# Patient Record
Sex: Female | Born: 1955 | Race: White | Hispanic: No | Marital: Married | Smoking: Never smoker
Health system: Southern US, Community
[De-identification: ages and names within clinical notes are randomized; demographics above are authoritative.]

## PROBLEM LIST (undated history)

## (undated) DIAGNOSIS — I350 Nonrheumatic aortic (valve) stenosis: Secondary | ICD-10-CM

## (undated) DIAGNOSIS — G4700 Insomnia, unspecified: Secondary | ICD-10-CM

## (undated) DIAGNOSIS — D649 Anemia, unspecified: Secondary | ICD-10-CM

## (undated) DIAGNOSIS — Z86718 Personal history of other venous thrombosis and embolism: Secondary | ICD-10-CM

## (undated) DIAGNOSIS — R911 Solitary pulmonary nodule: Secondary | ICD-10-CM

## (undated) DIAGNOSIS — M1990 Unspecified osteoarthritis, unspecified site: Secondary | ICD-10-CM

## (undated) DIAGNOSIS — I219 Acute myocardial infarction, unspecified: Secondary | ICD-10-CM

## (undated) DIAGNOSIS — M549 Dorsalgia, unspecified: Secondary | ICD-10-CM

## (undated) DIAGNOSIS — M2550 Pain in unspecified joint: Secondary | ICD-10-CM

## (undated) DIAGNOSIS — N3010 Interstitial cystitis (chronic) without hematuria: Secondary | ICD-10-CM

## (undated) DIAGNOSIS — B059 Measles without complication: Secondary | ICD-10-CM

## (undated) DIAGNOSIS — Z0000 Encounter for general adult medical examination without abnormal findings: Secondary | ICD-10-CM

## (undated) DIAGNOSIS — M25519 Pain in unspecified shoulder: Secondary | ICD-10-CM

## (undated) DIAGNOSIS — H269 Unspecified cataract: Secondary | ICD-10-CM

## (undated) DIAGNOSIS — G43909 Migraine, unspecified, not intractable, without status migrainosus: Secondary | ICD-10-CM

## (undated) DIAGNOSIS — F329 Major depressive disorder, single episode, unspecified: Secondary | ICD-10-CM

## (undated) DIAGNOSIS — I7300 Raynaud's syndrome without gangrene: Secondary | ICD-10-CM

## (undated) DIAGNOSIS — M7989 Other specified soft tissue disorders: Secondary | ICD-10-CM

## (undated) DIAGNOSIS — N390 Urinary tract infection, site not specified: Secondary | ICD-10-CM

## (undated) DIAGNOSIS — E669 Obesity, unspecified: Secondary | ICD-10-CM

## (undated) DIAGNOSIS — Z8619 Personal history of other infectious and parasitic diseases: Secondary | ICD-10-CM

## (undated) DIAGNOSIS — R55 Syncope and collapse: Secondary | ICD-10-CM

## (undated) DIAGNOSIS — F32A Depression, unspecified: Secondary | ICD-10-CM

## (undated) DIAGNOSIS — I10 Essential (primary) hypertension: Secondary | ICD-10-CM

## (undated) DIAGNOSIS — M719 Bursopathy, unspecified: Secondary | ICD-10-CM

## (undated) DIAGNOSIS — Z124 Encounter for screening for malignant neoplasm of cervix: Secondary | ICD-10-CM

## (undated) DIAGNOSIS — L659 Nonscarring hair loss, unspecified: Secondary | ICD-10-CM

## (undated) DIAGNOSIS — Z952 Presence of prosthetic heart valve: Secondary | ICD-10-CM

## (undated) DIAGNOSIS — R12 Heartburn: Secondary | ICD-10-CM

## (undated) DIAGNOSIS — R05 Cough: Secondary | ICD-10-CM

## (undated) DIAGNOSIS — E039 Hypothyroidism, unspecified: Secondary | ICD-10-CM

## (undated) DIAGNOSIS — I4891 Unspecified atrial fibrillation: Secondary | ICD-10-CM

## (undated) DIAGNOSIS — E785 Hyperlipidemia, unspecified: Secondary | ICD-10-CM

## (undated) DIAGNOSIS — R51 Headache: Secondary | ICD-10-CM

## (undated) DIAGNOSIS — K635 Polyp of colon: Secondary | ICD-10-CM

## (undated) DIAGNOSIS — F418 Other specified anxiety disorders: Secondary | ICD-10-CM

## (undated) DIAGNOSIS — K648 Other hemorrhoids: Secondary | ICD-10-CM

## (undated) DIAGNOSIS — F419 Anxiety disorder, unspecified: Secondary | ICD-10-CM

## (undated) DIAGNOSIS — R519 Headache, unspecified: Secondary | ICD-10-CM

## (undated) DIAGNOSIS — T7840XA Allergy, unspecified, initial encounter: Secondary | ICD-10-CM

## (undated) DIAGNOSIS — M797 Fibromyalgia: Secondary | ICD-10-CM

## (undated) DIAGNOSIS — K5900 Constipation, unspecified: Secondary | ICD-10-CM

## (undated) DIAGNOSIS — K219 Gastro-esophageal reflux disease without esophagitis: Secondary | ICD-10-CM

## (undated) HISTORY — DX: Syncope and collapse: R55

## (undated) HISTORY — DX: Solitary pulmonary nodule: R91.1

## (undated) HISTORY — DX: Nonscarring hair loss, unspecified: L65.9

## (undated) HISTORY — PX: BREAST BIOPSY: SHX20

## (undated) HISTORY — PX: FRACTURE SURGERY: SHX138

## (undated) HISTORY — DX: Anemia, unspecified: D64.9

## (undated) HISTORY — DX: Measles without complication: B05.9

## (undated) HISTORY — DX: Headache, unspecified: R51.9

## (undated) HISTORY — DX: Major depressive disorder, single episode, unspecified: F32.9

## (undated) HISTORY — PX: CORONARY ARTERY BYPASS GRAFT: SHX141

## (undated) HISTORY — PX: ATRIAL ABLATION SURGERY: SHX560

## (undated) HISTORY — DX: Anxiety disorder, unspecified: F41.9

## (undated) HISTORY — DX: Other hemorrhoids: K64.8

## (undated) HISTORY — DX: Encounter for screening for malignant neoplasm of cervix: Z12.4

## (undated) HISTORY — DX: Personal history of other venous thrombosis and embolism: Z86.718

## (undated) HISTORY — DX: Other specified anxiety disorders: F41.8

## (undated) HISTORY — DX: Unspecified atrial fibrillation: I48.91

## (undated) HISTORY — DX: Cough: R05

## (undated) HISTORY — DX: Unspecified osteoarthritis, unspecified site: M19.90

## (undated) HISTORY — DX: Migraine, unspecified, not intractable, without status migrainosus: G43.909

## (undated) HISTORY — DX: Urinary tract infection, site not specified: N39.0

## (undated) HISTORY — DX: Hyperlipidemia, unspecified: E78.5

## (undated) HISTORY — DX: Personal history of other infectious and parasitic diseases: Z86.19

## (undated) HISTORY — DX: Obesity, unspecified: E66.9

## (undated) HISTORY — DX: Raynaud's syndrome without gangrene: I73.00

## (undated) HISTORY — DX: Heartburn: R12

## (undated) HISTORY — DX: Pain in unspecified joint: M25.50

## (undated) HISTORY — DX: Unspecified cataract: H26.9

## (undated) HISTORY — PX: BLADDER SUSPENSION: SHX72

## (undated) HISTORY — DX: Acute myocardial infarction, unspecified: I21.9

## (undated) HISTORY — DX: Pain in unspecified shoulder: M25.519

## (undated) HISTORY — DX: Headache: R51

## (undated) HISTORY — DX: Essential (primary) hypertension: I10

## (undated) HISTORY — PX: ABDOMINAL HYSTERECTOMY: SHX81

## (undated) HISTORY — DX: Dorsalgia, unspecified: M54.9

## (undated) HISTORY — DX: Insomnia, unspecified: G47.00

## (undated) HISTORY — DX: Hypothyroidism, unspecified: E03.9

## (undated) HISTORY — DX: Interstitial cystitis (chronic) without hematuria: N30.10

## (undated) HISTORY — DX: Bursopathy, unspecified: M71.9

## (undated) HISTORY — PX: OTHER SURGICAL HISTORY: SHX169

## (undated) HISTORY — DX: Constipation, unspecified: K59.00

## (undated) HISTORY — DX: Presence of prosthetic heart valve: Z95.2

## (undated) HISTORY — PX: SHOULDER SURGERY: SHX246

## (undated) HISTORY — DX: Other specified soft tissue disorders: M79.89

## (undated) HISTORY — PX: TONSILLECTOMY: SUR1361

## (undated) HISTORY — DX: Fibromyalgia: M79.7

## (undated) HISTORY — PX: DILATION AND CURETTAGE OF UTERUS: SHX78

## (undated) HISTORY — PX: CARDIAC VALVE REPLACEMENT: SHX585

## (undated) HISTORY — DX: Encounter for general adult medical examination without abnormal findings: Z00.00

## (undated) HISTORY — DX: Allergy, unspecified, initial encounter: T78.40XA

## (undated) HISTORY — PX: TUBAL LIGATION: SHX77

## (undated) HISTORY — DX: Polyp of colon: K63.5

## (undated) HISTORY — DX: Gastro-esophageal reflux disease without esophagitis: K21.9

## (undated) HISTORY — DX: Depression, unspecified: F32.A

---

## 1998-09-18 ENCOUNTER — Other Ambulatory Visit: Admission: RE | Admit: 1998-09-18 | Discharge: 1998-09-18 | Payer: Self-pay | Admitting: Obstetrics and Gynecology

## 1999-10-04 ENCOUNTER — Other Ambulatory Visit: Admission: RE | Admit: 1999-10-04 | Discharge: 1999-10-04 | Payer: Self-pay | Admitting: Obstetrics and Gynecology

## 2000-06-12 ENCOUNTER — Inpatient Hospital Stay (HOSPITAL_COMMUNITY): Admission: EM | Admit: 2000-06-12 | Discharge: 2000-06-14 | Payer: Self-pay | Admitting: *Deleted

## 2000-06-12 ENCOUNTER — Encounter: Payer: Self-pay | Admitting: *Deleted

## 2000-06-13 ENCOUNTER — Encounter: Payer: Self-pay | Admitting: *Deleted

## 2000-10-14 ENCOUNTER — Other Ambulatory Visit: Admission: RE | Admit: 2000-10-14 | Discharge: 2000-10-14 | Payer: Self-pay | Admitting: Obstetrics and Gynecology

## 2004-04-17 ENCOUNTER — Emergency Department (HOSPITAL_COMMUNITY): Admission: EM | Admit: 2004-04-17 | Discharge: 2004-04-17 | Payer: Self-pay | Admitting: Emergency Medicine

## 2004-09-27 ENCOUNTER — Ambulatory Visit: Payer: Self-pay | Admitting: Internal Medicine

## 2004-10-22 ENCOUNTER — Ambulatory Visit: Payer: Self-pay | Admitting: Endocrinology

## 2005-06-06 ENCOUNTER — Ambulatory Visit: Payer: Self-pay | Admitting: Endocrinology

## 2005-06-07 ENCOUNTER — Ambulatory Visit: Payer: Self-pay | Admitting: Endocrinology

## 2007-05-21 ENCOUNTER — Ambulatory Visit: Payer: Self-pay | Admitting: Internal Medicine

## 2007-06-04 ENCOUNTER — Ambulatory Visit: Payer: Self-pay | Admitting: Internal Medicine

## 2007-06-04 ENCOUNTER — Encounter: Payer: Self-pay | Admitting: Internal Medicine

## 2011-02-15 NOTE — Discharge Summary (Signed)
[place]. [facility]  Patient:    Alexandra Patterson, Alexandra Patterson                         MRN: [id number] Adm. Date:  [id number] Disc. Date: 06/14/00 Attending:  Veneda Melter Dictator:   Tereso Newcomer, P.A.                           Discharge Summary  DATE OF BIRTH:  Oct 30, 1955.  DISCHARGE DIAGNOSES 1. First episode of recorded paroxysmal atrial fibrillation, with spontaneous    conversion to normal sinus rhythm. 2. Syncope possibly related to #1, also partly reason for admission. 3. Hypertension. 4. History of migraines. 5. History of anxiety. 6. Status post tubal ligation, 1983. 7. Tonsillectomy at age 65. 8. Dilatation and curettage x 3.  PROCEDURES PERFORMED THIS ADMISSION 1. Gated exercise treadmill Cardiolite on June 13, 2000 negative for    ischemia, EF 63%. 2. 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  ADMISSION HISTORY:  This 55 year old female, with the above-noted history, presented to the emergency room after suffering from a syncopal episode in her home that morning.  The patient noted that she was weak, nauseated, short of breath and had some palpitations; she also noted a chest heaviness located substernally that she qualified as a pressure that radiated to her upper back since arriving to the emergency room.  She denied any radiation to arms or neck, vomiting, visual changes or headache with the incident.  Patient presented to the [facility] Emergency Room in atrial fibrillation with a rate of 110 to 120.  PHYSICAL EXAMINATION:  Initial physical exam revealed an alert female in no acute distress.  Blood pressure 117/61, respirations 20, pulse 117, temperature 98.2.  NECK:   Without JVD or bruits.  LUNGS:  Clear to auscultation.  HEART:  Tachycardic, regular rhythm.  No murmurs, rubs, or gallops.  Positive S4.  EXTREMITIES:  Without edema.  LABORATORY DATA:  Initial labs:  Sodium 141, potassium 4.1, chloride 108, BUN 21, glucose 91, creatinine 1.0.  Hemoglobin 11.5, hematocrit 34.5, white count 7100, platelet count 324,000.  Total CK 36, CK-MB 0.6, troponin I 0.06.  HOSPITAL COURSE:  After complete workup in the emergency room, the patient spontaneously converted to normal sinus rhythm without any medical therapy. She remained in normal sinus rhythm throughout the remainder of her stay in the hospital.  She was noted to have a low hemoglobin and hematocrit; hemoglobin 10.7 and hematocrit 31.7 on recheck.  Her MCV was 90.5, RDW 13.2. Reticulocyte count was ordered and this was pending at discharge.  She subsequently ruled out for myocardial infarction by enzymes; total CK #2 26, CK-MB 0.8, troponin I less than 0.03.  Risk stratification revealed total cholesterol of 223, triglycerides 81, HDL 71, LDL 136.  TSH was checked and this was normal at 2.898.  T4 was 7.6.  Free T4 was 1.29.  Patient went for the treadmill Cardiolite on June 13, 2000; the results are noted above. It was decided, given the results of her echocardiogram and her Cardiolite, that she would be held overnight, continued to be monitored and if she had no other problems with  syncope, then she could be discharged to home.  On the morning of June 14, 2000, she was in stable condition without any further symptoms.  It was decided at that time that her Tenormin could be increased to 50 mg in the morning and 25 mg in the evening.  She should remain on aspirin for the time-being.  Discussions were made with Dr. Doylene Canning. Ladona Ridgel in regards to anticoagulation.  In light of a normal echocardiogram, with only mild valvular abnormalities, and a normal Cardiolite, heparin and/or Coumadin provided  no added benefit.  At discharge, it was decided that outpatient event monitor could be considered at followup to better evaluate her rate control. She has also been advised that she needs subacute bacterial endocarditis prophylaxis due to her mild valvular abnormalities.  DISCHARGE MEDICATIONS 1. Atenolol 50 mg q.a.m., 25 mg q.p.m. 2. Coated aspirin 325 mg q.d.  ACTIVITY:  As tolerated.  DIET:  Low-fat, low-sodium diet.  SPECIAL INSTRUCTIONS:  As noted above, the patient is to exercise SBE prophylaxis.  FOLLOWUP:  Followup is with Dr. Corwin Levins in one to two weeks, her primary care physician; she is to call for an appointment.  Follow up with Dr. Veneda Melter in one month; she has also been advised to call for an appointment.  As noted above, consideration for outpatient event monitor may be done at that time.  She has been asked to follow up with Dr. Jonny Ruiz for her anemia.  As noted above, reticulocyte count is pending at discharge. DD:  06/14/00 TD:  06/14/00 Job: [id number]

## 2012-03-10 ENCOUNTER — Encounter: Payer: Self-pay | Admitting: Internal Medicine

## 2012-03-10 ENCOUNTER — Ambulatory Visit (INDEPENDENT_AMBULATORY_CARE_PROVIDER_SITE_OTHER): Payer: Managed Care, Other (non HMO) | Admitting: Internal Medicine

## 2012-03-10 ENCOUNTER — Telehealth: Payer: Self-pay | Admitting: Internal Medicine

## 2012-03-10 ENCOUNTER — Encounter: Payer: Self-pay | Admitting: *Deleted

## 2012-03-10 VITALS — BP 124/80 | HR 72 | Ht 68.0 in | Wt 249.0 lb

## 2012-03-10 DIAGNOSIS — R112 Nausea with vomiting, unspecified: Secondary | ICD-10-CM

## 2012-03-10 DIAGNOSIS — K219 Gastro-esophageal reflux disease without esophagitis: Secondary | ICD-10-CM

## 2012-03-10 DIAGNOSIS — R1013 Epigastric pain: Secondary | ICD-10-CM

## 2012-03-10 DIAGNOSIS — K3189 Other diseases of stomach and duodenum: Secondary | ICD-10-CM

## 2012-03-10 MED ORDER — SUCRALFATE 1 GM/10ML PO SUSP: 1.0000 g | Freq: Two times a day (BID) | ORAL | Status: DC

## 2012-03-10 MED ORDER — ONDANSETRON HCL 4 MG PO TABS: ORAL_TABLET | ORAL | Status: DC

## 2012-03-10 MED ORDER — ESOMEPRAZOLE MAGNESIUM 40 MG PO CPDR: 40.0000 mg | DELAYED_RELEASE_CAPSULE | Freq: Two times a day (BID) | ORAL | Status: DC

## 2012-03-10 NOTE — Patient Instructions (Addendum)
You have been scheduled for an endoscopy with propofol. Please follow written instructions given to you at your visit today. Continue Pradaxa for your procedure as per Dr Juanda Chance. You have been scheduled for an abdominal ultrasound at [facility] (1st floor of hospital) on Monday, 03/16/12 at 9:00 am. Please arrive 15 minutes prior to your appointment for registration. Make certain not to have anything to eat or drink 6 hours prior to your appointment. Should you need to reschedule your appointment, please contact radiology at [phone]. We have sent the following medications to your pharmacy for you to pick up at your convenience: Carafate Zofran Nexium (take 1 tablet twice daily instead of 1 tablet once daily). CC: Dr R.Tonuzi

## 2012-03-10 NOTE — Progress Notes (Signed)
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 She has been under a great deal of stress since January 2013. She takes Mobic 15 mg daily for degenerative joint disease. She does not smoke and does not drink alcohol. Her mother just had gallbladder surgery. Patient has been on Nexium 40 mg daily. We did a screening colonoscopy in 2003 and again in 2008 because of a family history of colon cancer in her father. She had a hyperplastic polyp in 2008 and would be due for a recall colonoscopy in September 2013.   Past Medical History  Diagnosis Date  . Internal hemorrhoids   . Hyperplastic colon polyp   . Atrial fibrillation   . Hypertension   . Migraine   . Anxiety    Past Surgical History  Procedure Date  . Tonsillectomy   . Tubal ligation   . Dilation and curettage of uterus     x 3    reports that she has never smoked. She has never used smokeless tobacco. She reports that she does not drink alcohol or use illicit drugs. family history includes Colon cancer in an unspecified family member and Colon polyps in her father. Allergies  Allergen Reactions  . Erythromycin Rash  . Prednisone Rash        Review of Systems: Positive for heartburn. Negative for dysphagia odynophagia or chest pain  The remainder of the 10 point ROS is negative except as outlined in H&P   Physical Exam: General appearance  Well developed, in no distress. Overweight Eyes- non icteric. HEENT nontraumatic, normocephalic. Mouth no lesions, tongue papillated, no cheilosis. Neck supple without adenopathy, thyroid not enlarged, no carotid bruits, no JVD. Lungs Clear to auscultation bilaterally. Cor normal S1, normal S2, regular rhythm, no murmur,  quiet precordium. Abdomen: Obese soft with  mild discomfort in epigastrium and midline. Liver edge 2 cm below right costal margin. Splenic tip not palpable. No distention. No ascites. Rectal: Not done. Extremities no pedal edema. Skin no lesions. Neurological alert and oriented x 3. Psychological normal mood and affect.  Assessment and Plan:  Problem #1 Progressive dyspepsia, heartburn and indigestion suggestive of either nonulcer dyspepsia or possibly biliary dysfunction. She is on Mobic 15 mg daily which may cause gastropathy. She is not willing to stop because of possible joint pains. We will increase her Nexium to 40 mg by mouth twice a day and add Carafate slurry 10 cc by mouth twice a day. We will go ahead and schedule an upper abdominal ultrasound to rule out symptomatic gallbladder disease and will also schedule her for an upper endoscopy to rule out a gastric ulcer. She will be tested for H. Pylori at the time of her endoscopy. For her nausea, she will get Zofran 4 mg when necessary. She already was given Phenergan which makes her sleepy.  03/10/2012 Alexandra Patterson

## 2012-03-10 NOTE — Telephone Encounter (Signed)
Patient states she has been having nausea and severe reflux for the last few weeks. She is taking Nexium daily without relief. Scheduled with Dr. Juanda Chance today at 2:00 PM

## 2012-03-13 ENCOUNTER — Encounter: Payer: Self-pay | Admitting: Internal Medicine

## 2012-03-13 ENCOUNTER — Ambulatory Visit (AMBULATORY_SURGERY_CENTER): Payer: Managed Care, Other (non HMO) | Admitting: Internal Medicine

## 2012-03-13 VITALS — BP 145/75 | HR 76 | Temp 98.4°F | Resp 18 | Ht 68.0 in | Wt 249.0 lb

## 2012-03-13 DIAGNOSIS — R12 Heartburn: Secondary | ICD-10-CM

## 2012-03-13 DIAGNOSIS — D131 Benign neoplasm of stomach: Secondary | ICD-10-CM

## 2012-03-13 DIAGNOSIS — K2960 Other gastritis without bleeding: Secondary | ICD-10-CM

## 2012-03-13 MED ORDER — SODIUM CHLORIDE 0.9 % IV SOLN: 500.0000 mL | INTRAVENOUS | Status: DC

## 2012-03-13 NOTE — Progress Notes (Signed)
Patient did not experience any of the following events: a burn prior to discharge; a fall within the facility; wrong site/side/patient/procedure/implant event; or a hospital transfer or hospital admission upon discharge from the facility. (G8907) Patient did not have preoperative order for IV antibiotic SSI prophylaxis. (G8918)  

## 2012-03-13 NOTE — Patient Instructions (Addendum)

## 2012-03-13 NOTE — Op Note (Signed)
[facility] [address] [place], [place]  [address]  ENDOSCOPY PROCEDURE REPORT  PATIENT:  Alexandra Patterson, Alexandra Patterson  MR#:  [id number] BIRTHDATE:  01-Dec-1955, 55 yrs. old  GENDER:  female  ENDOSCOPIST:  Hedwig Morton. Juanda Chance, MD Referred by:  Geradine Girt, M.D.  PROCEDURE DATE:  03/13/2012 PROCEDURE:  EGD with biopsy, [id number] ASA CLASS:  Class II INDICATIONS:  nausea, GERD pt has been on Mobic 15 mg/day pt is on Predaxa  MEDICATIONS:   MAC sedation, administered by CRNA, propofol (Diprivan) 180 mg TOPICAL ANESTHETIC:  Cetacaine Spray  DESCRIPTION OF PROCEDURE:   After the risks benefits and alternatives of the procedure were thoroughly explained, informed consent was obtained.  The LB GIF-H180 [id number] endoscope was introduced through the mouth and advanced to the second portion of the duodenum, without limitations.  The instrument was slowly withdrawn as the mucosa was fully examined. <<PROCEDUREIMAGES>>  Mild gastritis was found. mild focal erythema in the antrum A biopsy for H. pylori was taken (see image2).  A hiatal hernia was found (see image5 and image4). 1-2 cm hiatal hernia  Otherwise the examination was normal (see image1 and image3).    Retroflexed views revealed no abnormalities.    The scope was then withdrawn from the patient and the procedure completed.  COMPLICATIONS:  None  ENDOSCOPIC IMPRESSION: 1) Mild gastritis 2) Hiatal hernia 3) Otherwise normal examination s/p biopsies to r/o H.pylori RECOMMENDATIONS: 1) Await biopsy results continue PPI, Carafata Abdominal ultrasound has been scheduled  REPEAT EXAM:  In 0 year(s) for.  ______________________________ Hedwig Morton. Juanda Chance, MD  CC:  n. eSIGNED:   Hedwig Morton. Evette Diclemente at 03/13/2012 11:09 AM  Elby Showers, [phone]

## 2012-03-16 ENCOUNTER — Ambulatory Visit (HOSPITAL_COMMUNITY)
Admission: RE | Admit: 2012-03-16 | Discharge: 2012-03-16 | Disposition: A | Discharge status: Home / Self Care | Payer: Managed Care, Other (non HMO) | Source: Ambulatory Visit | Attending: Internal Medicine | Admitting: Internal Medicine

## 2012-03-16 ENCOUNTER — Telehealth: Payer: Self-pay | Admitting: *Deleted

## 2012-03-16 DIAGNOSIS — R112 Nausea with vomiting, unspecified: Secondary | ICD-10-CM | POA: Insufficient documentation

## 2012-03-16 DIAGNOSIS — R109 Unspecified abdominal pain: Secondary | ICD-10-CM | POA: Insufficient documentation

## 2012-03-16 DIAGNOSIS — I10 Essential (primary) hypertension: Secondary | ICD-10-CM | POA: Insufficient documentation

## 2012-03-16 NOTE — Telephone Encounter (Signed)
  Follow up Call-  Call back number 03/13/2012  Post procedure Call Back phone  # [phone]  Permission to leave phone message Yes     Left message to call if having problems or has questions

## 2012-03-17 ENCOUNTER — Encounter: Payer: Self-pay | Admitting: Internal Medicine

## 2012-03-27 ENCOUNTER — Encounter (HOSPITAL_BASED_OUTPATIENT_CLINIC_OR_DEPARTMENT_OTHER): Payer: Self-pay | Admitting: *Deleted

## 2012-03-27 ENCOUNTER — Emergency Department (HOSPITAL_BASED_OUTPATIENT_CLINIC_OR_DEPARTMENT_OTHER)
Admission: EM | Admit: 2012-03-27 | Discharge: 2012-03-27 | Disposition: A | Discharge status: Home / Self Care | Payer: Managed Care, Other (non HMO) | Attending: Emergency Medicine | Admitting: Emergency Medicine

## 2012-03-27 DIAGNOSIS — G43909 Migraine, unspecified, not intractable, without status migrainosus: Secondary | ICD-10-CM

## 2012-03-27 DIAGNOSIS — I4891 Unspecified atrial fibrillation: Secondary | ICD-10-CM | POA: Insufficient documentation

## 2012-03-27 DIAGNOSIS — I10 Essential (primary) hypertension: Secondary | ICD-10-CM | POA: Insufficient documentation

## 2012-03-27 MED ORDER — SODIUM CHLORIDE 0.9 % IV BOLUS (SEPSIS)
1000.0000 mL | Freq: Once | INTRAVENOUS | Status: AC
  Administered 2012-03-27: 1000 mL via INTRAVENOUS

## 2012-03-27 MED ORDER — METOCLOPRAMIDE HCL 5 MG/ML IJ SOLN
10.0000 mg | Freq: Once | INTRAMUSCULAR | Status: AC
  Administered 2012-03-27: 10 mg via INTRAVENOUS
  Filled 2012-03-27: qty 2

## 2012-03-27 MED ORDER — DIPHENHYDRAMINE HCL 50 MG/ML IJ SOLN
25.0000 mg | Freq: Once | INTRAMUSCULAR | Status: AC
  Administered 2012-03-27: 25 mg via INTRAVENOUS
  Filled 2012-03-27: qty 1

## 2012-03-27 NOTE — ED Notes (Signed)
Woke up at 0330 this am with migraine has nasal imitrex but for last 2-3 headaches hasnt worked

## 2012-03-27 NOTE — ED Provider Notes (Signed)
History     CSN: [id number]  Arrival date & time 03/27/12  1242   First MD Initiated Contact with Patient 03/27/12 1300      Chief Complaint  Patient presents with  . Migraine    (Consider location/radiation/quality/duration/timing/severity/associated sxs/prior treatment) HPI Comments: Pt has a longstanding hx of migraines, presents today with headache, similar to her past migraines.  Woke up with pain this am, describes at throbbing to top of head, going down neck.  Same type pain as prior headaches.  Is followed by a neurologist in [place].  No fevers, no vomiting, no unusual headache or other symptoms.  No numbness or weakness to extremities.  No fever, no rash.  Patient is a 56 y.o. female presenting with migraine. The history is provided by the patient.  Migraine This is a new problem. The current episode started 12 to 24 hours ago. The problem occurs constantly. The problem has been gradually worsening. Associated symptoms include headaches. Pertinent negatives include no chest pain, no abdominal pain and no shortness of breath.    Past Medical History  Diagnosis Date  . Internal hemorrhoids   . Hyperplastic colon polyp   . Atrial fibrillation   . Hypertension   . Migraine   . Anxiety     Past Surgical History  Procedure Date  . Tonsillectomy   . Tubal ligation   . Dilation and curettage of uterus     x 3  . Abdominal hysterectomy   . Shoulder surgery   . Atrial ablation surgery     Family History  Problem Relation Age of Onset  . Colon cancer      parent  . Colon polyps Father     History  Substance Use Topics  . Smoking status: Never Smoker   . Smokeless tobacco: Never Used  . Alcohol Use: No    OB History    Grav Para Term Preterm Abortions TAB SAB Ect Mult Living                  Review of Systems  Constitutional: Negative for fever, chills, diaphoresis and fatigue.  HENT: Negative for congestion, rhinorrhea and sneezing.   Eyes: Negative.     Respiratory: Negative for cough, chest tightness and shortness of breath.   Cardiovascular: Negative for chest pain and leg swelling.  Gastrointestinal: Positive for nausea. Negative for vomiting, abdominal pain, diarrhea and blood in stool.  Genitourinary: Negative for frequency, hematuria, flank pain and difficulty urinating.  Musculoskeletal: Negative for back pain and arthralgias.  Skin: Negative for rash.  Neurological: Positive for headaches. Negative for dizziness, speech difficulty, weakness and numbness.  Psychiatric/Behavioral: Negative for confusion.    Allergies  Erythromycin and Prednisone  Home Medications   Current Outpatient Rx  Name Route Sig Dispense Refill  . DABIGATRAN ETEXILATE MESYLATE 150 MG PO CAPS Oral Take 150 mg by mouth 2 (two) times daily.    Marland Kitchen ESOMEPRAZOLE MAGNESIUM 40 MG PO CPDR Oral Take 1 capsule (40 mg total) by mouth 2 (two) times daily. 60 capsule 2  . ESTRADIOL 1 MG PO TABS Oral Take 1 mg by mouth daily.    . IRBESARTAN 150 MG PO TABS Oral Take 150 mg by mouth at bedtime.    . MELOXICAM 15 MG PO TABS Oral Take 15 mg by mouth daily.    . NYSTATIN 100000 UNIT/GM EX CREA Topical Apply 1 application topically 2 (two) times daily.    Marland Kitchen ONDANSETRON HCL 4 MG PO TABS  Take 1 tablet by mouth every 6-8 hours as needed. 30 tablet 0  . PREGABALIN 75 MG PO CAPS Oral Take 75 mg by mouth 2 (two) times daily.    Marland Kitchen SIMVASTATIN 20 MG PO TABS Oral Take 20 mg by mouth every evening.    Marland Kitchen SOTALOL HCL (AF) 120 MG PO TABS Oral Take 60 mg by mouth 2 (two) times daily.    . SUCRALFATE 1 GM/10ML PO SUSP Oral Take 10 mLs (1 g total) by mouth 2 (two) times daily. 600 mL 0  . SUMATRIPTAN 20 MG/ACT NA SOLN Nasal Place 1 spray into the nose every 2 (two) hours as needed.    . VENLAFAXINE HCL ER 225 MG PO TB24 Oral Take 1 tablet by mouth daily.    Marland Kitchen VITAMIN D (ERGOCALCIFEROL) 50000 UNITS PO CAPS Oral Take 50,000 Units by mouth every 7 (seven) days.    Marland Kitchen ZOLPIDEM TARTRATE ER 12.5  MG PO TBCR Oral Take 12.5 mg by mouth at bedtime as needed.      BP 145/91  Pulse 83  Temp 97.6 F (36.4 C) (Oral)  Resp 20  SpO2 100%  Physical Exam  Constitutional: She is oriented to person, place, and time. She appears well-developed and well-nourished.  HENT:  Head: Normocephalic and atraumatic.  Eyes: Pupils are equal, round, and reactive to light.  Neck: Normal range of motion. Neck supple.  Cardiovascular: Normal rate, regular rhythm and normal heart sounds.   Pulmonary/Chest: Effort normal and breath sounds normal. No respiratory distress. She has no wheezes. She has no rales. She exhibits no tenderness.  Abdominal: Soft. Bowel sounds are normal. There is no tenderness. There is no rebound and no guarding.  Musculoskeletal: Normal range of motion. She exhibits no edema.  Lymphadenopathy:    She has no cervical adenopathy.  Neurological: She is alert and oriented to person, place, and time. She has normal strength. No cranial nerve deficit or sensory deficit. GCS eye subscore is 4. GCS verbal subscore is 5. GCS motor subscore is 6.  Skin: Skin is warm and dry. No rash noted.  Psychiatric: She has a normal mood and affect.    ED Course  Procedures (including critical care time)  Labs Reviewed - No data to display No results found.   1. Migraine       MDM  Pt got benadryl and reglan injections with great relief of headache.  Pain feels typical of her past migraines. There's nothing to suggest other etiology such as subarachnoid hemorrhage or meningitis        Rolan Bucco, MD 03/27/12 1431

## 2012-04-29 ENCOUNTER — Encounter: Payer: Self-pay | Admitting: Internal Medicine

## 2012-05-19 ENCOUNTER — Other Ambulatory Visit: Payer: Self-pay | Admitting: Internal Medicine

## 2012-05-25 ENCOUNTER — Other Ambulatory Visit: Payer: Self-pay | Admitting: Internal Medicine

## 2014-01-31 ENCOUNTER — Telehealth: Payer: Self-pay | Admitting: *Deleted

## 2014-01-31 NOTE — Telephone Encounter (Signed)
I have gotten records from [employer], Dr Delphi office. These records were from 11/09/2013 (including labwork, hgb 9.0 and office notes), no recent notes. Fax read " Regarding Alexandra Patterson DOB:2056/02/17 Please call patient and schedule appointment ASAP due to low blood count." Patient was actually already on our schedule for 03/08/14. However, I contacted Rande Brunt at [employer] (who actually sent the fax) to get more recent notes since I was sure that an "urgent" referral would not have just been sent over from 4 months ago. Ivin Booty states that patient came to their office, had a low blood count, was seen by [facility] and scheduled for endoscopy and colonoscopy. She states patient cancelled these procedures because she thought they were "jumping the gun" and she wanted to see her old doctor. I advised that unfortunately, this was a change in care when she went to [place] and that we would need records for Dr Olevia Perches to review and decide if she would accept patient back prior to [place] scheduling a visit. Ivin Booty verbalizes understanding and will tell patient this. Patient has been taken off schedule for 03/08/14 as well until we get records.

## 2014-02-08 NOTE — Telephone Encounter (Signed)
I contacted patient to advise that Dr Olevia Perches agreed to see her back in the office and would like her to be seen ASAP due to anemia. Patient states that she is actually rescheduled at [place] for colonoscopy this upcoming week.

## 2014-03-08 ENCOUNTER — Ambulatory Visit: Payer: Managed Care, Other (non HMO) | Admitting: Internal Medicine

## 2014-04-18 ENCOUNTER — Encounter: Payer: Self-pay | Admitting: Internal Medicine

## 2015-03-13 ENCOUNTER — Ambulatory Visit (INDEPENDENT_AMBULATORY_CARE_PROVIDER_SITE_OTHER): Payer: Managed Care, Other (non HMO) | Admitting: Family Medicine

## 2015-03-13 ENCOUNTER — Encounter: Payer: Self-pay | Admitting: Family Medicine

## 2015-03-13 VITALS — BP 134/90 | HR 78 | Temp 98.9°F | Ht 67.0 in | Wt 244.4 lb

## 2015-03-13 DIAGNOSIS — E669 Obesity, unspecified: Secondary | ICD-10-CM

## 2015-03-13 DIAGNOSIS — Z0000 Encounter for general adult medical examination without abnormal findings: Secondary | ICD-10-CM

## 2015-03-13 DIAGNOSIS — R911 Solitary pulmonary nodule: Secondary | ICD-10-CM | POA: Insufficient documentation

## 2015-03-13 DIAGNOSIS — Z1283 Encounter for screening for malignant neoplasm of skin: Secondary | ICD-10-CM | POA: Diagnosis not present

## 2015-03-13 DIAGNOSIS — G43909 Migraine, unspecified, not intractable, without status migrainosus: Secondary | ICD-10-CM | POA: Insufficient documentation

## 2015-03-13 DIAGNOSIS — E785 Hyperlipidemia, unspecified: Secondary | ICD-10-CM | POA: Diagnosis not present

## 2015-03-13 DIAGNOSIS — T7840XA Allergy, unspecified, initial encounter: Secondary | ICD-10-CM | POA: Insufficient documentation

## 2015-03-13 DIAGNOSIS — K635 Polyp of colon: Secondary | ICD-10-CM | POA: Insufficient documentation

## 2015-03-13 DIAGNOSIS — K219 Gastro-esophageal reflux disease without esophagitis: Secondary | ICD-10-CM | POA: Diagnosis not present

## 2015-03-13 DIAGNOSIS — Z952 Presence of prosthetic heart valve: Secondary | ICD-10-CM

## 2015-03-13 DIAGNOSIS — Z8619 Personal history of other infectious and parasitic diseases: Secondary | ICD-10-CM | POA: Insufficient documentation

## 2015-03-13 DIAGNOSIS — Z954 Presence of other heart-valve replacement: Secondary | ICD-10-CM

## 2015-03-13 DIAGNOSIS — D649 Anemia, unspecified: Secondary | ICD-10-CM

## 2015-03-13 DIAGNOSIS — I10 Essential (primary) hypertension: Secondary | ICD-10-CM | POA: Insufficient documentation

## 2015-03-13 DIAGNOSIS — Z86718 Personal history of other venous thrombosis and embolism: Secondary | ICD-10-CM | POA: Insufficient documentation

## 2015-03-13 DIAGNOSIS — M797 Fibromyalgia: Secondary | ICD-10-CM | POA: Insufficient documentation

## 2015-03-13 DIAGNOSIS — I4891 Unspecified atrial fibrillation: Secondary | ICD-10-CM

## 2015-03-13 LAB — CBC
HCT: 40.9 % (ref 36.0–46.0)
Hemoglobin: 13.2 g/dL (ref 12.0–15.0)
MCHC: 32.4 g/dL (ref 30.0–36.0)
MCV: 86.8 fl (ref 78.0–100.0)
Platelets: 276 10*3/uL (ref 150.0–400.0)
RBC: 4.71 Mil/uL (ref 3.87–5.11)
RDW: 15.8 % — ABNORMAL HIGH (ref 11.5–15.5)
WBC: 9.8 10*3/uL (ref 4.0–10.5)

## 2015-03-13 LAB — LIPID PANEL
CHOLESTEROL: 214 mg/dL — AB (ref 0–200)
HDL: 59.8 mg/dL (ref >39.00)
LDL CALC: 131 mg/dL — AB (ref 0–99)
NONHDL: 154.2
Total CHOL/HDL Ratio: 4
Triglycerides: 117 mg/dL (ref 0.0–149.0)
VLDL: 23.4 mg/dL (ref 0.0–40.0)

## 2015-03-13 LAB — COMPREHENSIVE METABOLIC PANEL
ALK PHOS: 91 U/L (ref 39–117)
ALT: 14 U/L (ref 0–35)
AST: 16 U/L (ref 0–37)
Albumin: 4.3 g/dL (ref 3.5–5.2)
BILIRUBIN TOTAL: 0.6 mg/dL (ref 0.2–1.2)
BUN: 13 mg/dL (ref 6–23)
CO2: 25 mEq/L (ref 19–32)
CREATININE: 0.78 mg/dL (ref 0.40–1.20)
Calcium: 9.4 mg/dL (ref 8.4–10.5)
Chloride: 105 mEq/L (ref 96–112)
GFR: 80.1 mL/min (ref >60.00)
Glucose, Bld: 84 mg/dL (ref 70–99)
Potassium: 3.7 mEq/L (ref 3.5–5.1)
Sodium: 138 mEq/L (ref 135–145)
TOTAL PROTEIN: 6.8 g/dL (ref 6.0–8.3)

## 2015-03-13 LAB — TSH: TSH: 1.16 u[IU]/mL (ref 0.35–4.50)

## 2015-03-13 MED ORDER — ZOLPIDEM TARTRATE ER 12.5 MG PO TBCR: 12.5000 mg | EXTENDED_RELEASE_TABLET | Freq: Every evening | ORAL | Status: DC | PRN

## 2015-03-13 NOTE — Progress Notes (Signed)
Pre visit review using our clinic review tool, if applicable. No additional management support is needed unless otherwise documented below in the visit note. 

## 2015-03-13 NOTE — Patient Instructions (Signed)
Fibromyalgia Fibromyalgia is a disorder that is often misunderstood. It is associated with muscular pains and tenderness that comes and goes. It is often associated with fatigue and sleep disturbances. Though it tends to be long-lasting, fibromyalgia is not life-threatening. CAUSES  The exact cause of fibromyalgia is unknown. People with certain gene types are predisposed to developing fibromyalgia and other conditions. Certain factors can play a role as triggers, such as:  Spine disorders.  Arthritis.  Severe injury (trauma) and other physical stressors.  Emotional stressors. SYMPTOMS   The main symptom is pain and stiffness in the muscles and joints, which can vary over time.  Sleep and fatigue problems. Other related symptoms may include:  Bowel and bladder problems.  Headaches.  Visual problems.  Problems with odors and noises.  Depression or mood changes.  Painful periods (dysmenorrhea).  Dryness of the skin or eyes. DIAGNOSIS  There are no specific tests for diagnosing fibromyalgia. Patients can be diagnosed accurately from the specific symptoms they have. The diagnosis is made by determining that nothing else is causing the problems. TREATMENT  There is no cure. Management includes medicines and an active, healthy lifestyle. The goal is to enhance physical fitness, decrease pain, and improve sleep. HOME CARE INSTRUCTIONS   Only take over-the-counter or prescription medicines as directed by your caregiver. Sleeping pills, tranquilizers, and pain medicines may make your problems worse.  Low-impact aerobic exercise is very important and advised for treatment. At first, it may seem to make pain worse. Gradually increasing your tolerance will overcome this feeling.  Learning relaxation techniques and how to control stress will help you. Biofeedback, visual imagery, hypnosis, muscle relaxation, yoga, and meditation are all options.  Anti-inflammatory medicines and  physical therapy may provide short-term help.  Acupuncture or massage treatments may help.  Take muscle relaxant medicines as suggested by your caregiver.  Avoid stressful situations.  Plan a healthy lifestyle. This includes your diet, sleep, rest, exercise, and friends.  Find and practice a hobby you enjoy.  Join a fibromyalgia support group for interaction, ideas, and sharing advice. This may be helpful. SEEK MEDICAL CARE IF:  You are not having good results or improvement from your treatment. FOR MORE INFORMATION  National Fibromyalgia Association: www.fmaware.org Arthritis Foundation: www.arthritis.org Document Released: 09/16/2005 Document Revised: 12/09/2011 Document Reviewed: 12/27/2009 ExitCare Patient Information 2015 [employer]. This information is not intended to replace advice given to you by your health care provider. Make sure you discuss any questions you have with your health care provider.  

## 2015-03-13 NOTE — Assessment & Plan Note (Signed)
Well controlled, no changes to meds. Encouraged heart healthy diet such as the DASH diet and exercise as tolerated.  °

## 2015-03-19 ENCOUNTER — Encounter: Payer: Self-pay | Admitting: Family Medicine

## 2015-03-19 DIAGNOSIS — Z952 Presence of prosthetic heart valve: Secondary | ICD-10-CM

## 2015-03-19 DIAGNOSIS — D649 Anemia, unspecified: Secondary | ICD-10-CM | POA: Insufficient documentation

## 2015-03-19 HISTORY — DX: Presence of prosthetic heart valve: Z95.2

## 2015-03-19 NOTE — Assessment & Plan Note (Signed)
Encouraged DASH diet, decrease po intake and increase exercise as tolerated. Needs 7-8 hours of sleep nightly. Avoid trans fats, eat small, frequent meals every 4-5 hours with lean proteins, complex carbs and healthy fats. Minimize simple carbs 

## 2015-03-19 NOTE — Assessment & Plan Note (Signed)
Tolerating statin, encouraged heart healthy diet, avoid trans fats, minimize simple carbs and saturated fats. Increase exercise as tolerated 

## 2015-03-19 NOTE — Assessment & Plan Note (Addendum)
Rate controlled today, tolerating Pradaxa and Sotalol

## 2015-03-19 NOTE — Assessment & Plan Note (Signed)
>>  ASSESSMENT AND PLAN FOR CLASS 2 SEVERE OBESITY WITH SERIOUS COMORBIDITY AND BODY MASS INDEX (BMI) OF 36.0 TO 36.9 IN ADULT, UNSPECIFIED OBESITY TYPE (HCC) WRITTEN ON 03/19/2015  6:31 PM BY BLYTH, STACEY A, MD  Encouraged DASH diet, decrease po intake and increase exercise as tolerated. Needs 7-8 hours of sleep nightly. Avoid trans fats, eat small, frequent meals every 4-5 hours with lean proteins, complex carbs and healthy fats. Minimize simple carbs

## 2015-03-19 NOTE — Assessment & Plan Note (Signed)
Doing well and following with [facility] cardiology division

## 2015-03-19 NOTE — Assessment & Plan Note (Signed)
Avoid offending foods, start probiotics. Do not eat large meals in late evening and consider raising head of bed. Nexium prn

## 2015-03-19 NOTE — Progress Notes (Signed)
Alexandra Patterson  [id number] 07-08-1956 03/19/2015      Progress Note-Follow Up  Subjective  Chief Complaint  Chief Complaint  Patient presents with  . Establish Care    HPI  Patient is a 59 y.o. female in today for routine medical care. Patient is in today to establish care. 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 She also struggles with allergies, migraines and heartburn. Today she feels fairly well. Previously is followed with Dr. Christen Bame of neurology at [facility] as well as my eye doctor in [place] for her ophthalmology care. Sees Dr. Kathryne Hitch for dermatology does 3-D mammograms at [facility] as well as bone densities. She follows with [facility] Aurelio [facility] but is also seen [facility]. Receives her cardiac care [facility]. Denies CP/palp/SOB/HA/congestion/fevers/GI or GU c/o. Taking meds as prescribed  Past Medical History  Diagnosis Date  . Internal hemorrhoids   . Hyperplastic colon polyp   . Atrial fibrillation   . Migraine   . Anxiety   . Depression   . Frequent headaches   . Hx of blood clots     dvt  . UTI (lower urinary tract infection)   . Fainting     once to due heart out of rythm  . Fibromyalgia   . Lesion of lung     xray in 2014 thought to be benign seen at [facility] pulmonology  . Allergy   . GERD (gastroesophageal reflux disease)   . Hyperlipidemia   . Hypertension   . Obesity   . History of chicken pox   . Measles     h/o  . S/P AVR (aortic valve replacement) 03/19/2015    Past Surgical History  Procedure Laterality Date  . Tonsillectomy    . Tubal ligation    . Dilation and curettage of uterus      x 3  . Shoulder surgery Left     arthroscopy for spurs  . Atrial ablation surgery    . Loop heart    . Abdominal  hysterectomy      TAH SPO    Family History  Problem Relation Age of Onset  . Colon cancer      parent  . Colon polyps Father   . Alzheimer's disease Father   . Alcohol abuse Father   . Arthritis Mother   . Neuropathy Mother   . Hyperlipidemia Mother   . Other Mother     familial mediterranean fever  . Cancer Brother     prostate cancer  . Heart disease Brother     cardiomegaly  . Other Brother     familial mediterranean fever  . Asthma Maternal Grandmother   . Congestive Heart Failure Maternal Grandmother   . Heart disease Maternal Grandmother     chf  . Stroke Maternal Grandfather   . Diabetes Maternal Grandfather   . Heart disease Maternal Grandfather     hardening of the arteries  . Stroke Paternal Grandfather   . Atrial fibrillation Paternal Grandfather   . Alcohol abuse Paternal Grandfather   . Heart disease Paternal Grandfather     afib  . Arthritis Paternal Uncle   . Interstitial cystitis Daughter   . Arthritis Son   . Alcohol abuse Son   . Mental illness Son     depression  History   Social History  . Marital Status: Married    Spouse Name: N/A  . Number of Children: 2  . Years of Education: N/A   Occupational History  . Minister    Social History Main Topics  . Smoking status: Never Smoker   . Smokeless tobacco: Never Used  . Alcohol Use: No  . Drug Use: No  . Sexual Activity: Yes     Comment: lives with husband and adult son, no major dietary restrictions, full diability   Other Topics Concern  . Not on file   Social History Narrative    Current Outpatient Prescriptions on File Prior to Visit  Medication Sig Dispense Refill  . dabigatran (PRADAXA) 150 MG CAPS Take 150 mg by mouth 2 (two) times daily.    . meloxicam (MOBIC) 15 MG tablet Take 15 mg by mouth daily.    Marland Kitchen NEXIUM 40 MG capsule TAKE ONE CAPSULE BY MOUTH TWICE DAILY 60 capsule 5  . ondansetron (ZOFRAN) 4 MG tablet TAKE 1 TABLET BY MOUTH EVERY 6 TO 8 HOURS AS NEEDED 30  tablet 2  . simvastatin (ZOCOR) 20 MG tablet Take 20 mg by mouth every evening.     No current facility-administered medications on file prior to visit.    Allergies  Allergen Reactions  . Sulfa Antibiotics Hives  . Erythromycin Rash  . Prednisone Rash    Review of Systems  Review of Systems  Constitutional: Negative for fever, chills and malaise/fatigue.  HENT: Negative for congestion, hearing loss and nosebleeds.   Eyes: Negative for discharge.  Respiratory: Negative for cough, sputum production, shortness of breath and wheezing.   Cardiovascular: Negative for chest pain, palpitations and leg swelling.  Gastrointestinal: Negative for heartburn, nausea, vomiting, abdominal pain, diarrhea, constipation and blood in stool.  Genitourinary: Negative for dysuria, urgency, frequency and hematuria.  Musculoskeletal: Negative for myalgias, back pain and falls.  Skin: Negative for rash.  Neurological: Negative for dizziness, tremors, sensory change, focal weakness, loss of consciousness, weakness and headaches.  Endo/Heme/Allergies: Negative for polydipsia. Does not bruise/bleed easily.  Psychiatric/Behavioral: Negative for depression and suicidal ideas. The patient is not nervous/anxious and does not have insomnia.     Objective  BP 134/90 mmHg  Pulse 78  Temp(Src) 98.9 F (37.2 C) (Oral)  Ht 5\' 7"  (1.702 m)  Wt 244 lb 6 oz (110.848 kg)  BMI 38.27 kg/m2  SpO2 95%  Physical Exam  Physical Exam  Constitutional: She is oriented to person, place, and time and well-developed, well-nourished, and in no distress. No distress.  HENT:  Head: Normocephalic and atraumatic.  Right Ear: External ear normal.  Left Ear: External ear normal.  Nose: Nose normal.  Mouth/Throat: Oropharynx is clear and moist. No oropharyngeal exudate.  Eyes: Conjunctivae are normal. Pupils are equal, round, and reactive to light. Right eye exhibits no discharge. Left eye exhibits no discharge. No scleral  icterus.  Neck: Normal range of motion. Neck supple. No thyromegaly present.  Cardiovascular: Normal rate and intact distal pulses.   Murmur heard. Irregular, systolic click  Pulmonary/Chest: Effort normal and breath sounds normal. No respiratory distress. She has no wheezes. She has no rales.  Abdominal: Soft. Bowel sounds are normal. She exhibits no distension and no mass. There is no tenderness.  Musculoskeletal: Normal range of motion. She exhibits no edema or tenderness.  Lymphadenopathy:    She has no cervical adenopathy.  Neurological: She is alert and oriented to person, place, and time. She has normal reflexes.  No cranial nerve deficit. Coordination normal.  Skin: Skin is warm and dry. No rash noted. She is not diaphoretic.  Psychiatric: Mood, memory and affect normal.    Lab Results  Component Value Date   TSH 1.16 03/13/2015   Lab Results  Component Value Date   WBC 9.8 03/13/2015   HGB 13.2 03/13/2015   HCT 40.9 03/13/2015   MCV 86.8 03/13/2015   PLT 276.0 03/13/2015   Lab Results  Component Value Date   CREATININE 0.78 03/13/2015   BUN 13 03/13/2015   NA 138 03/13/2015   K 3.7 03/13/2015   CL 105 03/13/2015   CO2 25 03/13/2015   Lab Results  Component Value Date   ALT 14 03/13/2015   AST 16 03/13/2015   ALKPHOS 91 03/13/2015   BILITOT 0.6 03/13/2015   Lab Results  Component Value Date   CHOL 214* 03/13/2015   Lab Results  Component Value Date   HDL 59.80 03/13/2015   Lab Results  Component Value Date   LDLCALC 131* 03/13/2015   Lab Results  Component Value Date   TRIG 117.0 03/13/2015   Lab Results  Component Value Date   CHOLHDL 4 03/13/2015     Assessment & Plan  Hypertension Well controlled, no changes to meds. Encouraged heart healthy diet such as the DASH diet and exercise as tolerated.   GERD (gastroesophageal reflux disease) Avoid offending foods, start probiotics. Do not eat large meals in late evening and consider raising  head of bed. Nexium prn  Hyperlipidemia Tolerating statin, encouraged heart healthy diet, avoid trans fats, minimize simple carbs and saturated fats. Increase exercise as tolerated  Atrial fibrillation Rate controlled today, tolerating Pradaxa and Sotalol  Obesity Encouraged DASH diet, decrease po intake and increase exercise as tolerated. Needs 7-8 hours of sleep nightly. Avoid trans fats, eat small, frequent meals every 4-5 hours with lean proteins, complex carbs and healthy fats. Minimize simple carbs  S/P AVR (aortic valve replacement) Doing well and following with [facility] cardiology division

## 2015-04-06 ENCOUNTER — Telehealth: Payer: Self-pay | Admitting: Family Medicine

## 2015-04-06 MED ORDER — DULOXETINE HCL 60 MG PO CPEP
60.0000 mg | ORAL_CAPSULE | Freq: Two times a day (BID) | ORAL | Status: DC
Start: 2015-04-06 — End: 2015-10-31

## 2015-04-06 NOTE — Telephone Encounter (Signed)
Caller name: Tyshae Relation to pt: Call back number: [phone] Pharmacy: [facility] on [address] and [address]  Reason for call:   Patient requesting cymbalta refill. 60mg  two tablets daily. generic

## 2015-04-06 NOTE — Telephone Encounter (Signed)
Sent in Cymbalta as requested and called the patient to inform.

## 2015-04-25 ENCOUNTER — Encounter: Payer: Self-pay | Admitting: Physician Assistant

## 2015-04-25 ENCOUNTER — Ambulatory Visit (INDEPENDENT_AMBULATORY_CARE_PROVIDER_SITE_OTHER): Payer: Managed Care, Other (non HMO) | Admitting: Physician Assistant

## 2015-04-25 VITALS — BP 148/98 | HR 92 | Temp 98.1°F | Ht 67.0 in | Wt 250.6 lb

## 2015-04-25 DIAGNOSIS — F430 Acute stress reaction: Secondary | ICD-10-CM | POA: Diagnosis not present

## 2015-04-25 MED ORDER — BUSPIRONE HCL 7.5 MG PO TABS: 7.5000 mg | ORAL_TABLET | Freq: Two times a day (BID) | ORAL | Status: DC

## 2015-04-25 NOTE — Assessment & Plan Note (Signed)
Continue Cymbalta. Will add-on BuSpar 7.5 mg BID. Counseling recommended and handout given on our counseling services. Follow-up 1 month. Return sooner if anything worsens.

## 2015-04-25 NOTE — Progress Notes (Signed)
Patient presents to clinic today c/o increased anxiety over past two weeks due to recent stressors. Son currently living at home due to divorce from wife and patient is having to help him financially. Is adding stresses. Patient states she was initially able to handle this stress but now is making her anxious to the point of causing loose stools and abdominal cramping. Has history of depression and fibromyalgia, both well-controlled with Cymbalta 60 mg daily. Endorses taking medication as directed. Denies panic attack, noting more generalized anxiety. Denies SI/HI.  Past Medical History  Diagnosis Date  . Internal hemorrhoids   . Hyperplastic colon polyp   . Atrial fibrillation   . Migraine   . Anxiety   . Depression   . Frequent headaches   . Hx of blood clots     dvt  . UTI (lower urinary tract infection)   . Fainting     once to due heart out of rythm  . Fibromyalgia   . Lesion of lung     xray in 2014 thought to be benign seen at [facility] pulmonology  . Allergy   . GERD (gastroesophageal reflux disease)   . Hyperlipidemia   . Hypertension   . Obesity   . History of chicken pox   . Measles     h/o  . S/P AVR (aortic valve replacement) 03/19/2015    Current Outpatient Prescriptions on File Prior to Visit  Medication Sig Dispense Refill  . acetaminophen-codeine (TYLENOL #3) 300-30 MG per tablet Take by mouth every 4 (four) hours as needed for moderate pain.    . dabigatran (PRADAXA) 150 MG CAPS Take 150 mg by mouth 2 (two) times daily.    . DULoxetine (CYMBALTA) 60 MG capsule Take 1 capsule (60 mg total) by mouth 2 (two) times daily. 60 capsule 6  . meloxicam (MOBIC) 15 MG tablet Take 15 mg by mouth daily.    Marland Kitchen NEXIUM 40 MG capsule TAKE ONE CAPSULE BY MOUTH TWICE DAILY 60 capsule 5  . ondansetron (ZOFRAN) 4 MG tablet TAKE 1 TABLET BY MOUTH EVERY 6 TO 8 HOURS AS NEEDED 30 tablet 2  . rizatriptan (MAXALT) 10 MG tablet Take 10 mg by mouth as needed for migraine. May repeat  in 2 hours if needed    . simvastatin (ZOCOR) 20 MG tablet Take 20 mg by mouth every evening.    . topiramate (TOPAMAX) 50 MG tablet Take 50 mg by mouth daily.    Marland Kitchen triamcinolone (NASACORT ALLERGY 24HR) 55 MCG/ACT AERO nasal inhaler Place 2 sprays into the nose 2 (two) times daily.    Marland Kitchen zolpidem (AMBIEN CR) 12.5 MG CR tablet Take 1 tablet (12.5 mg total) by mouth at bedtime as needed. 30 tablet 3   No current facility-administered medications on file prior to visit.    Allergies  Allergen Reactions  . Sulfa Antibiotics Hives  . Erythromycin Rash  . Prednisone Rash    Family History  Problem Relation Age of Onset  . Colon cancer      parent  . Colon polyps Father   . Alzheimer's disease Father   . Alcohol abuse Father   . Arthritis Mother   . Neuropathy Mother   . Hyperlipidemia Mother   . Other Mother     familial mediterranean fever  . Cancer Brother     prostate cancer  . Heart disease Brother     cardiomegaly  . Other Brother     familial mediterranean fever  .  Asthma Maternal Grandmother   . Congestive Heart Failure Maternal Grandmother   . Heart disease Maternal Grandmother     chf  . Stroke Maternal Grandfather   . Diabetes Maternal Grandfather   . Heart disease Maternal Grandfather     hardening of the arteries  . Stroke Paternal Grandfather   . Atrial fibrillation Paternal Grandfather   . Alcohol abuse Paternal Grandfather   . Heart disease Paternal Grandfather     afib  . Arthritis Paternal Uncle   . Interstitial cystitis Daughter   . Arthritis Son   . Alcohol abuse Son   . Mental illness Son     depression    History   Social History  . Marital Status: Married    Spouse Name: N/A  . Number of Children: 2  . Years of Education: N/A   Occupational History  . Minister    Social History Main Topics  . Smoking status: Never Smoker   . Smokeless tobacco: Never Used  . Alcohol Use: No  . Drug Use: No  . Sexual Activity: Yes     Comment: lives  with husband and adult son, no major dietary restrictions, full diability   Other Topics Concern  . None   Social History Narrative    Review of Systems - See HPI.  All other ROS are negative.  BP 148/98 mmHg  Pulse 92  Temp(Src) 98.1 F (36.7 C) (Oral)  Ht '5\' 7"'  (1.702 m)  Wt 250 lb 9.6 oz (113.671 kg)  BMI 39.24 kg/m2  SpO2 98%  Physical Exam  Constitutional: She is oriented to person, place, and time and well-developed, well-nourished, and in no distress.  HENT:  Head: Normocephalic and atraumatic.  Eyes: Conjunctivae are normal.  Neck: Neck supple. No thyromegaly present.  Cardiovascular: Normal rate, regular rhythm, normal heart sounds and intact distal pulses.   Pulmonary/Chest: Effort normal and breath sounds normal. No respiratory distress. She has no wheezes. She has no rales. She exhibits no tenderness.  Neurological: She is alert and oriented to person, place, and time.  Skin: Skin is warm and dry. No rash noted.  Psychiatric: Her mood appears anxious.  Vitals reviewed.   Recent Results (from the past 2160 hour(s))  Lipid Profile     Status: Abnormal   Collection Time: 03/13/15 11:42 AM  Result Value Ref Range   Cholesterol 214 (H) 0 - 200 mg/dL    Comment: ATP III Classification       Desirable:  < 200 mg/dL               Borderline High:  200 - 239 mg/dL          High:  > = 240 mg/dL   Triglycerides 117.0 0.0 - 149.0 mg/dL    Comment: Normal:  <150 mg/dLBorderline High:  150 - 199 mg/dL   HDL 59.80 >39.00 mg/dL   VLDL 23.4 0.0 - 40.0 mg/dL   LDL Cholesterol 131 (H) 0 - 99 mg/dL   Total CHOL/HDL Ratio 4     Comment:                Men          Women1/2 Average Risk     3.4          3.3Average Risk          5.0          4.42X Average Risk          9.6  7.13X Average Risk          15.0          11.0                       NonHDL 154.20     Comment: NOTE:  Non-HDL goal should be 30 mg/dL higher than patient's LDL goal (i.e. LDL goal of < 70 mg/dL, would  have non-HDL goal of < 100 mg/dL)  CBC     Status: Abnormal   Collection Time: 03/13/15 11:42 AM  Result Value Ref Range   WBC 9.8 4.0 - 10.5 K/uL   RBC 4.71 3.87 - 5.11 Mil/uL   Platelets 276.0 150.0 - 400.0 K/uL   Hemoglobin 13.2 12.0 - 15.0 g/dL   HCT 40.9 36.0 - 46.0 %   MCV 86.8 78.0 - 100.0 fl   MCHC 32.4 30.0 - 36.0 g/dL   RDW 15.8 (H) 11.5 - 15.5 %  Comp Met (CMET)     Status: None   Collection Time: 03/13/15 11:42 AM  Result Value Ref Range   Sodium 138 135 - 145 mEq/L   Potassium 3.7 3.5 - 5.1 mEq/L   Chloride 105 96 - 112 mEq/L   CO2 25 19 - 32 mEq/L   Glucose, Bld 84 70 - 99 mg/dL   BUN 13 6 - 23 mg/dL   Creatinine, Ser 0.78 0.40 - 1.20 mg/dL   Total Bilirubin 0.6 0.2 - 1.2 mg/dL   Alkaline Phosphatase 91 39 - 117 U/L   AST 16 0 - 37 U/L   ALT 14 0 - 35 U/L   Total Protein 6.8 6.0 - 8.3 g/dL   Albumin 4.3 3.5 - 5.2 g/dL   Calcium 9.4 8.4 - 10.5 mg/dL   GFR 80.10 >60.00 mL/min  TSH     Status: None   Collection Time: 03/13/15 11:42 AM  Result Value Ref Range   TSH 1.16 0.35 - 4.50 uIU/mL    Assessment/Plan: Acute stress reaction Continue Cymbalta. Will add-on BuSpar 7.5 mg BID. Counseling recommended and handout given on our counseling services. Follow-up 1 month. Return sooner if anything worsens.

## 2015-04-25 NOTE — Patient Instructions (Signed)
Please continue the Cymbalta as directed. Start the BuSpar twice daily as directed for anxiety. Try to take some time for yourself! You cannot take care of everyone else if you do not take care of yourself first! Keep on praying!    I want you to review the handout on our counseling services. I think either of our counselors would serve you very well.  Follow-up with myself or Dr. [name] in 1 month. Do not hesitate to call or come see [place] sooner if you need [place]!

## 2015-04-25 NOTE — Progress Notes (Signed)
Pre visit review using our clinic review tool, if applicable. No additional management support is needed unless otherwise documented below in the visit note. 

## 2015-05-26 ENCOUNTER — Encounter: Payer: Self-pay | Admitting: Family Medicine

## 2015-05-26 ENCOUNTER — Ambulatory Visit (INDEPENDENT_AMBULATORY_CARE_PROVIDER_SITE_OTHER): Payer: Managed Care, Other (non HMO) | Admitting: Family Medicine

## 2015-05-26 VITALS — BP 144/94 | HR 86 | Temp 98.3°F | Ht 67.0 in | Wt 252.5 lb

## 2015-05-26 DIAGNOSIS — E785 Hyperlipidemia, unspecified: Secondary | ICD-10-CM | POA: Diagnosis not present

## 2015-05-26 DIAGNOSIS — Z23 Encounter for immunization: Secondary | ICD-10-CM

## 2015-05-26 DIAGNOSIS — F430 Acute stress reaction: Secondary | ICD-10-CM | POA: Diagnosis not present

## 2015-05-26 DIAGNOSIS — I10 Essential (primary) hypertension: Secondary | ICD-10-CM | POA: Diagnosis not present

## 2015-05-26 DIAGNOSIS — K219 Gastro-esophageal reflux disease without esophagitis: Secondary | ICD-10-CM

## 2015-05-26 DIAGNOSIS — E669 Obesity, unspecified: Secondary | ICD-10-CM

## 2015-05-26 MED ORDER — ESOMEPRAZOLE MAGNESIUM 40 MG PO CPDR: 40.0000 mg | DELAYED_RELEASE_CAPSULE | Freq: Every day | ORAL | Status: DC

## 2015-05-26 MED ORDER — BUSPIRONE HCL 7.5 MG PO TABS: 7.5000 mg | ORAL_TABLET | Freq: Three times a day (TID) | ORAL | Status: DC

## 2015-05-26 MED ORDER — RANITIDINE HCL 300 MG PO TABS: 300.0000 mg | ORAL_TABLET | Freq: Every day | ORAL | Status: DC

## 2015-05-26 NOTE — Assessment & Plan Note (Signed)
Encouraged DASH diet, decrease po intake and increase exercise as tolerated. Needs 7-8 hours of sleep nightly. Avoid trans fats, eat small, frequent meals every 4-5 hours with lean proteins, complex carbs and healthy fats. Minimize simple carbs, GMO foods. 

## 2015-05-26 NOTE — Patient Instructions (Signed)
Generalized Anxiety Disorder Generalized anxiety disorder (GAD) is a mental disorder. It interferes with life functions, including relationships, work, and school. GAD is different from normal anxiety, which everyone experiences at some point in their lives in response to specific life events and activities. Normal anxiety actually helps us prepare for and get through these life events and activities. Normal anxiety goes away after the event or activity is over.  GAD causes anxiety that is not necessarily related to specific events or activities. It also causes excess anxiety in proportion to specific events or activities. The anxiety associated with GAD is also difficult to control. GAD can vary from mild to severe. People with severe GAD can have intense waves of anxiety with physical symptoms (panic attacks).  SYMPTOMS The anxiety and worry associated with GAD are difficult to control. This anxiety and worry are related to many life events and activities and also occur more days than not for 6 months or longer. People with GAD also have three or more of the following symptoms (one or more in children):  Restlessness.   Fatigue.  Difficulty concentrating.   Irritability.  Muscle tension.  Difficulty sleeping or unsatisfying sleep. DIAGNOSIS GAD is diagnosed through an assessment by your health care provider. Your health care provider will ask you questions aboutyour mood,physical symptoms, and events in your life. Your health care provider may ask you about your medical history and use of alcohol or drugs, including prescription medicines. Your health care provider may also do a physical exam and blood tests. Certain medical conditions and the use of certain substances can cause symptoms similar to those associated with GAD. Your health care provider may refer you to a mental health specialist for further evaluation. TREATMENT The following therapies are usually used to treat GAD:    Medication. Antidepressant medication usually is prescribed for long-term daily control. Antianxiety medicines may be added in severe cases, especially when panic attacks occur.   Talk therapy (psychotherapy). Certain types of talk therapy can be helpful in treating GAD by providing support, education, and guidance. A form of talk therapy called cognitive behavioral therapy can teach you healthy ways to think about and react to daily life events and activities.  Stress managementtechniques. These include yoga, meditation, and exercise and can be very helpful when they are practiced regularly. A mental health specialist can help determine which treatment is best for you. Some people see improvement with one therapy. However, other people require a combination of therapies. Document Released: 01/11/2013 Document Revised: 01/31/2014 Document Reviewed: 01/11/2013 Exit[employer] Patient Information 2015 [employer]. This information is not intended to replace advice given to you by your health care provider. Make sure you discuss any questions you have with your health care provider.  

## 2015-05-26 NOTE — Assessment & Plan Note (Signed)
>>  ASSESSMENT AND PLAN FOR CLASS 2 SEVERE OBESITY WITH SERIOUS COMORBIDITY AND BODY MASS INDEX (BMI) OF 36.0 TO 36.9 IN ADULT, UNSPECIFIED OBESITY TYPE (HCC) WRITTEN ON 05/26/2015  4:09 PM BY BLYTH, STACEY A, MD  Encouraged DASH diet, decrease po intake and increase exercise as tolerated. Needs 7-8 hours of sleep nightly. Avoid trans fats, eat small, frequent meals every 4-5 hours with lean proteins, complex carbs and healthy fats. Minimize simple carbs, GMO foods.

## 2015-05-26 NOTE — Assessment & Plan Note (Signed)
Very stressed over adult son is living at home again due to divorce secondary to alcohol abuse. buspar has been somewhat helpful  Daughter also going through a divorce. Referred to behavioral health for further management. Increase Buspar dosing today and reevaluate at next visit.

## 2015-05-26 NOTE — Progress Notes (Signed)
Patient ID: Alexandra Patterson, female   DOB: [birth year], 59 y.o.   MRN: [id number]   Subjective:    Patient ID: Alexandra Patterson, female    DOB: Jul 08, 1956, 59 y.o.   MRN: [id number]  Chief Complaint  Patient presents with  . Follow-up    anxiety    HPI Patient is in today for follow up. Continues to struggle with reflux but it is improved, is requesting a refill on Nexium. Is also struggling with significant increased stressors mostly relating to her adult children. No suicidal or homicidal ideation but anhedonia is noted. Denies CP/palp/SOB/HA/congestion/fevers or GU c/o. Taking meds as prescribed  Past Medical History  Diagnosis Date  . Internal hemorrhoids   . Hyperplastic colon polyp   . Atrial fibrillation   . Migraine   . Anxiety   . Depression   . Frequent headaches   . Hx of blood clots     dvt  . UTI (lower urinary tract infection)   . Fainting     once to due heart out of rythm  . Fibromyalgia   . Lesion of lung     xray in 2014 thought to be benign seen at [facility] pulmonology  . Allergy   . GERD (gastroesophageal reflux disease)   . Hyperlipidemia   . Hypertension   . Obesity   . History of chicken pox   . Measles     h/o  . S/P AVR (aortic valve replacement) 03/19/2015    Past Surgical History  Procedure Laterality Date  . Tonsillectomy    . Tubal ligation    . Dilation and curettage of uterus      x 3  . Shoulder surgery Left     arthroscopy for spurs  . Atrial ablation surgery    . Loop heart    . Abdominal hysterectomy      TAH SPO    Family History  Problem Relation Age of Onset  . Colon cancer      parent  . Colon polyps Father   . Alzheimer's disease Father   . Alcohol abuse Father   . Arthritis Mother   . Neuropathy Mother   . Hyperlipidemia Mother   . Other Mother     familial mediterranean fever  . Cancer Brother     prostate cancer  . Heart disease Brother     cardiomegaly  . Other Brother     familial mediterranean fever  .  Asthma Maternal Grandmother   . Congestive Heart Failure Maternal Grandmother   . Heart disease Maternal Grandmother     chf  . Stroke Maternal Grandfather   . Diabetes Maternal Grandfather   . Heart disease Maternal Grandfather     hardening of the arteries  . Stroke Paternal Grandfather   . Atrial fibrillation Paternal Grandfather   . Alcohol abuse Paternal Grandfather   . Heart disease Paternal Grandfather     afib  . Arthritis Paternal Uncle   . Interstitial cystitis Daughter   . Arthritis Son   . Alcohol abuse Son   . Mental illness Son     depression    Social History   Social History  . Marital Status: Married    Spouse Name: N/A  . Number of Children: 2  . Years of Education: N/A   Occupational History  . Minister    Social History Main Topics  . Smoking status: Never Smoker   . Smokeless tobacco: Never Used  . Alcohol Use: No  .  Drug Use: No  . Sexual Activity: Yes     Comment: lives with husband and adult son, no major dietary restrictions, full diability   Other Topics Concern  . Not on file   Social History Narrative    Outpatient Prescriptions Prior to Visit  Medication Sig Dispense Refill  . acetaminophen-codeine (TYLENOL #3) 300-30 MG per tablet Take by mouth every 4 (four) hours as needed for moderate pain.    . busPIRone (BUSPAR) 7.5 MG tablet Take 1 tablet (7.5 mg total) by mouth 2 (two) times daily. 60 tablet 0  . dabigatran (PRADAXA) 150 MG CAPS Take 150 mg by mouth 2 (two) times daily.    . DULoxetine (CYMBALTA) 60 MG capsule Take 1 capsule (60 mg total) by mouth 2 (two) times daily. 60 capsule 6  . lisinopril (PRINIVIL,ZESTRIL) 10 MG tablet Take 10 mg by mouth daily.    . meloxicam (MOBIC) 15 MG tablet Take 15 mg by mouth daily.    . metoprolol succinate (TOPROL-XL) 50 MG 24 hr tablet Take 50 mg by mouth 2 (two) times daily. Take with or immediately following a meal.    . NEXIUM 40 MG capsule TAKE ONE CAPSULE BY MOUTH TWICE DAILY 60 capsule  5  . ondansetron (ZOFRAN) 4 MG tablet TAKE 1 TABLET BY MOUTH EVERY 6 TO 8 HOURS AS NEEDED 30 tablet 2  . rizatriptan (MAXALT) 10 MG tablet Take 10 mg by mouth as needed for migraine. May repeat in 2 hours if needed    . simvastatin (ZOCOR) 20 MG tablet Take 20 mg by mouth every evening.    . topiramate (TOPAMAX) 50 MG tablet Takes 75 mg daily    . triamcinolone (NASACORT ALLERGY 24HR) 55 MCG/ACT AERO nasal inhaler Place 2 sprays into the nose 2 (two) times daily.    Marland Kitchen zolpidem (AMBIEN CR) 12.5 MG CR tablet Take 1 tablet (12.5 mg total) by mouth at bedtime as needed. 30 tablet 3   No facility-administered medications prior to visit.    Allergies  Allergen Reactions  . Sulfa Antibiotics Hives  . Erythromycin Rash  . Prednisone Rash    Review of Systems  Constitutional: Negative for fever and malaise/fatigue.  HENT: Negative for congestion.   Eyes: Negative for discharge.  Respiratory: Negative for shortness of breath.   Cardiovascular: Negative for chest pain, palpitations and leg swelling.  Gastrointestinal: Negative for nausea and abdominal pain.  Genitourinary: Negative for dysuria.  Musculoskeletal: Negative for falls.  Skin: Negative for rash.  Neurological: Negative for loss of consciousness and headaches.  Endo/Heme/Allergies: Negative for environmental allergies.  Psychiatric/Behavioral: Negative for depression. The patient is not nervous/anxious.        Objective:    Physical Exam  BP 144/94 mmHg  Pulse 86  Temp(Src) 98.3 F (36.8 C) (Oral)  Ht 5\' 7"  (1.702 m)  Wt 252 lb 8 oz (114.533 kg)  BMI 39.54 kg/m2  SpO2 100% Wt Readings from Last 3 Encounters:  05/26/15 252 lb 8 oz (114.533 kg)  04/25/15 250 lb 9.6 oz (113.671 kg)  03/13/15 244 lb 6 oz (110.848 kg)     Lab Results  Component Value Date   WBC 9.8 03/13/2015   HGB 13.2 03/13/2015   HCT 40.9 03/13/2015   PLT 276.0 03/13/2015   GLUCOSE 84 03/13/2015   CHOL 214* 03/13/2015   TRIG 117.0 03/13/2015    HDL 59.80 03/13/2015   LDLCALC 131* 03/13/2015   ALT 14 03/13/2015   AST 16 03/13/2015  NA 138 03/13/2015   K 3.7 03/13/2015   CL 105 03/13/2015   CREATININE 0.78 03/13/2015   BUN 13 03/13/2015   CO2 25 03/13/2015   TSH 1.16 03/13/2015    Lab Results  Component Value Date   TSH 1.16 03/13/2015   Lab Results  Component Value Date   WBC 9.8 03/13/2015   HGB 13.2 03/13/2015   HCT 40.9 03/13/2015   MCV 86.8 03/13/2015   PLT 276.0 03/13/2015   Lab Results  Component Value Date   NA 138 03/13/2015   K 3.7 03/13/2015   CO2 25 03/13/2015   GLUCOSE 84 03/13/2015   BUN 13 03/13/2015   CREATININE 0.78 03/13/2015   BILITOT 0.6 03/13/2015   ALKPHOS 91 03/13/2015   AST 16 03/13/2015   ALT 14 03/13/2015   PROT 6.8 03/13/2015   ALBUMIN 4.3 03/13/2015   CALCIUM 9.4 03/13/2015   GFR 80.10 03/13/2015   Lab Results  Component Value Date   CHOL 214* 03/13/2015   Lab Results  Component Value Date   HDL 59.80 03/13/2015   Lab Results  Component Value Date   LDLCALC 131* 03/13/2015   Lab Results  Component Value Date   TRIG 117.0 03/13/2015   Lab Results  Component Value Date   CHOLHDL 4 03/13/2015   No results found for: HGBA1C     Assessment & Plan:   Obesity Encouraged DASH diet, decrease po intake and increase exercise as tolerated. Needs 7-8 hours of sleep nightly. Avoid trans fats, eat small, frequent meals every 4-5 hours with lean proteins, complex carbs and healthy fats. Minimize simple carbs, GMO foods.  Acute stress reaction Very stressed over adult son is living at home again due to divorce secondary to alcohol abuse. buspar has been somewhat helpful  Daughter also going through a divorce. Referred to behavioral health for further management. Increase Buspar dosing today and reevaluate at next visit.   Hypertension Well controlled, no changes to meds. Encouraged heart healthy diet such as the DASH diet and exercise as tolerated.    Hyperlipidemia Tolerating statin, encouraged heart healthy diet, avoid trans fats, minimize simple carbs and saturated fats. Increase exercise as tolerated  GERD (gastroesophageal reflux disease) Avoid offending foods, start probiotics. Do not eat large meals in late evening and consider raising head of bed. Given refill on Nexium.   I am having Ms. Gaetano maintain her dabigatran, meloxicam, simvastatin, ondansetron, NEXIUM, acetaminophen-codeine, rizatriptan, topiramate, triamcinolone, zolpidem, DULoxetine, metoprolol succinate, lisinopril, and busPIRone.  No orders of the defined types were placed in this encounter.     Elizabeth Sauer, LPN

## 2015-05-26 NOTE — Progress Notes (Signed)
Pre visit review using our clinic review tool, if applicable. No additional management support is needed unless otherwise documented below in the visit note. 

## 2015-05-28 NOTE — Assessment & Plan Note (Signed)
Avoid offending foods, start probiotics. Do not eat large meals in late evening and consider raising head of bed. Given refill on Nexium.

## 2015-05-28 NOTE — Assessment & Plan Note (Signed)
Well controlled, no changes to meds. Encouraged heart healthy diet such as the DASH diet and exercise as tolerated.  °

## 2015-05-28 NOTE — Assessment & Plan Note (Signed)
Tolerating statin, encouraged heart healthy diet, avoid trans fats, minimize simple carbs and saturated fats. Increase exercise as tolerated 

## 2015-05-31 ENCOUNTER — Telehealth: Payer: Self-pay | Admitting: Family Medicine

## 2015-05-31 NOTE — Telephone Encounter (Signed)
Caller name: Doloros Kwolek Relationship to patient: patient Can be reached:[phone] Pharmacy: N/A  Reason for call: Per patients email:  I'm trying to return to [facility] to see a rheumatologist. I haven't been there in 3 years so I have to get a referral. Would you be so kind as to do this for me? My doctor retired so I'm going to try to get a female. I'm having a great deal of pain with arthritis and fibro.

## 2015-05-31 NOTE — Telephone Encounter (Signed)
I am happy to help but not her rheumatology has had a lot of turnover lately, not sure they have a female, we can find out with referral if she would like. There is also a Dr Trudie Reed who is a female who has just opened a new off ice on [address] and we could refer her there at her discretion, please check with patient.

## 2015-06-01 ENCOUNTER — Other Ambulatory Visit: Payer: Self-pay | Admitting: Family Medicine

## 2015-06-01 DIAGNOSIS — R52 Pain, unspecified: Secondary | ICD-10-CM

## 2015-06-01 NOTE — Telephone Encounter (Signed)
Spoke to the patient and please do refer to Dr. Trudie Reed.

## 2015-07-19 ENCOUNTER — Telehealth: Payer: Self-pay | Admitting: Family Medicine

## 2015-07-19 ENCOUNTER — Encounter: Payer: Self-pay | Admitting: Family Medicine

## 2015-07-19 NOTE — Telephone Encounter (Signed)
Caller name: Relationship to patient: Can be reached: Pharmacy: [id number] - [place], [place] - [address] AT [place]  Reason for call: Pt needing refill on zolpidem. She is out of meds. She is out of refills. Pt said pharmacy contacted [place] previously.

## 2015-07-19 NOTE — Telephone Encounter (Signed)
Last filled: 03/13/15 Amt: 30, 3 Last OV: 05/26/15  Please advise.

## 2015-07-19 NOTE — Telephone Encounter (Signed)
I do not see a previous request but she is due for a refill so please print rx for Zolpidem with same strength, same sig, #30 with 3 rf

## 2015-07-20 NOTE — Telephone Encounter (Signed)
So I approved this already but do not see where it was completed. Please call in her refill for Zolpidem as directed

## 2015-07-21 MED ORDER — ZOLPIDEM TARTRATE ER 12.5 MG PO TBCR: 12.5000 mg | EXTENDED_RELEASE_TABLET | Freq: Every evening | ORAL | Status: DC | PRN

## 2015-07-21 NOTE — Telephone Encounter (Signed)
I called this patient to inform her of the delay.  She stated Dr. [name] had already let her know it would be sent in this am by mychart.  She was in tears stating she cannot sleep due to fibromyalgia and would really appreciate if another provider would be willing to fill.

## 2015-07-21 NOTE — Telephone Encounter (Signed)
Printed and on counter for signature once PCP is back in the office.

## 2015-07-21 NOTE — Telephone Encounter (Signed)
Phoned in to [facility] refill for Zolpidem #30 days only no refills. Approved by Elyn Aquas PA.   Patient aware refill for #30 days done, No refills.

## 2015-07-21 NOTE — Telephone Encounter (Signed)
Updated refill list

## 2015-07-21 NOTE — Telephone Encounter (Signed)
Not sure what happened but you are welcome to call in 30-day supple under my name in Dr. Frederik Pear absence. I am happy to help.

## 2015-08-04 ENCOUNTER — Ambulatory Visit (INDEPENDENT_AMBULATORY_CARE_PROVIDER_SITE_OTHER): Payer: Managed Care, Other (non HMO) | Admitting: Family Medicine

## 2015-08-04 ENCOUNTER — Encounter: Payer: Self-pay | Admitting: Family Medicine

## 2015-08-04 VITALS — BP 126/80 | HR 90 | Temp 98.7°F | Ht 67.5 in | Wt 253.0 lb

## 2015-08-04 DIAGNOSIS — R05 Cough: Secondary | ICD-10-CM

## 2015-08-04 DIAGNOSIS — K219 Gastro-esophageal reflux disease without esophagitis: Secondary | ICD-10-CM | POA: Diagnosis not present

## 2015-08-04 DIAGNOSIS — G4700 Insomnia, unspecified: Secondary | ICD-10-CM | POA: Diagnosis not present

## 2015-08-04 DIAGNOSIS — I10 Essential (primary) hypertension: Secondary | ICD-10-CM

## 2015-08-04 DIAGNOSIS — E785 Hyperlipidemia, unspecified: Secondary | ICD-10-CM

## 2015-08-04 DIAGNOSIS — F430 Acute stress reaction: Secondary | ICD-10-CM | POA: Diagnosis not present

## 2015-08-04 DIAGNOSIS — E669 Obesity, unspecified: Secondary | ICD-10-CM

## 2015-08-04 DIAGNOSIS — R059 Cough, unspecified: Secondary | ICD-10-CM

## 2015-08-04 DIAGNOSIS — I4891 Unspecified atrial fibrillation: Secondary | ICD-10-CM

## 2015-08-04 MED ORDER — ZOLPIDEM TARTRATE ER 12.5 MG PO TBCR: 12.5000 mg | EXTENDED_RELEASE_TABLET | Freq: Every evening | ORAL | Status: DC | PRN

## 2015-08-04 MED ORDER — ESOMEPRAZOLE MAGNESIUM 40 MG PO CPDR: 40.0000 mg | DELAYED_RELEASE_CAPSULE | Freq: Two times a day (BID) | ORAL | Status: DC

## 2015-08-04 MED ORDER — BUSPIRONE HCL 7.5 MG PO TABS: 7.5000 mg | ORAL_TABLET | Freq: Three times a day (TID) | ORAL | Status: DC

## 2015-08-04 NOTE — Patient Instructions (Signed)
Consider switching from Lisinopril to Losartan  consider a multiple strain probiotic, such as [place] has a 10 strain probiotic, can purchase online at [email or web address]  Food Choices for Gastroesophageal Reflux Disease, Adult When you have gastroesophageal reflux disease (GERD), the foods you eat and your eating habits are very important. Choosing the right foods can help ease your discomfort.  WHAT GUIDELINES DO I NEED TO FOLLOW?   Choose fruits, vegetables, whole grains, and low-fat dairy products.   Choose low-fat meat, fish, and poultry.  Limit fats such as oils, salad dressings, butter, nuts, and avocado.   Keep a food diary. This helps you identify foods that cause symptoms.   Avoid foods that cause symptoms. These may be different for everyone.   Eat small meals often instead of 3 large meals a day.   Eat your meals slowly, in a place where you are relaxed.   Limit fried foods.   Cook foods using methods other than frying.   Avoid drinking alcohol.   Avoid drinking large amounts of liquids with your meals.   Avoid bending over or lying down until 2-3 hours after eating.  WHAT FOODS ARE NOT RECOMMENDED?  These are some foods and drinks that may make your symptoms worse: Vegetables Tomatoes. Tomato juice. Tomato and spaghetti sauce. Chili peppers. Onion and garlic. Horseradish. Fruits Oranges, grapefruit, and lemon (fruit and juice). Meats High-fat meats, fish, and poultry. This includes hot dogs, ribs, ham, sausage, salami, and bacon. Dairy Whole milk and chocolate milk. Sour cream. Cream. Butter. Ice cream. Cream cheese.  Drinks Coffee and tea. Bubbly (carbonated) drinks or energy drinks. Condiments Hot sauce. Barbecue sauce.  Sweets/Desserts Chocolate and cocoa. Donuts. Peppermint and spearmint. Fats and Oils High-fat foods. This includes [place] fries and potato chips. Other Vinegar. Strong spices. This includes black pepper, white pepper, red  pepper, cayenne, curry powder, cloves, ginger, and chili powder. The items listed above may not be a complete list of foods and drinks to avoid. Contact your dietitian for more information.   This information is not intended to replace advice given to you by your health care provider. Make sure you discuss any questions you have with your health care provider.   Document Released: 03/17/2012 Document Revised: 10/07/2014 Document Reviewed: 07/21/2013 Elsevier Interactive Patient Education [employer].

## 2015-08-04 NOTE — Assessment & Plan Note (Signed)
Worsening, increase Nexium to bid again as that helped more, request records from [facility] gastroenterology notes from 2015 endoscopies and notes. Avoid hot brew

## 2015-08-04 NOTE — Assessment & Plan Note (Signed)
>>  ASSESSMENT AND PLAN FOR CLASS 2 SEVERE OBESITY WITH SERIOUS COMORBIDITY AND BODY MASS INDEX (BMI) OF 36.0 TO 36.9 IN ADULT, UNSPECIFIED OBESITY TYPE (HCC) WRITTEN ON 08/04/2015  2:10 PM BY BLYTH, STACEY A, MD  Encouraged DASH diet, decrease po intake and increase exercise as tolerated. Needs 7-8 hours of sleep nightly. Avoid trans fats, eat small, frequent meals every 4-5 hours with lean proteins, complex carbs and healthy fats. Minimize simple carbs, GMO foods.

## 2015-08-04 NOTE — Progress Notes (Signed)
Pre visit review using our clinic review tool, if applicable. No additional management support is needed unless otherwise documented below in the visit note. 

## 2015-08-04 NOTE — Assessment & Plan Note (Signed)
Encouraged DASH diet, decrease po intake and increase exercise as tolerated. Needs 7-8 hours of sleep nightly. Avoid trans fats, eat small, frequent meals every 4-5 hours with lean proteins, complex carbs and healthy fats. Minimize simple carbs, GMO foods. 

## 2015-08-13 ENCOUNTER — Encounter: Payer: Self-pay | Admitting: Family Medicine

## 2015-08-13 DIAGNOSIS — R059 Cough, unspecified: Secondary | ICD-10-CM

## 2015-08-13 DIAGNOSIS — R05 Cough: Secondary | ICD-10-CM | POA: Insufficient documentation

## 2015-08-13 DIAGNOSIS — G4700 Insomnia, unspecified: Secondary | ICD-10-CM

## 2015-08-13 HISTORY — DX: Cough, unspecified: R05.9

## 2015-08-13 HISTORY — DX: Insomnia, unspecified: G47.00

## 2015-08-13 NOTE — Assessment & Plan Note (Signed)
Encouraged good sleep hygiene such as dark, quiet room. No blue/green glowing lights such as computer screens in bedroom. No alcohol or stimulants in evening. Cut down on caffeine as able. Regular exercise is helpful but not just prior to bed time.  Ambien prn 

## 2015-08-13 NOTE — Assessment & Plan Note (Signed)
Well controlled, no changes to meds. Encouraged heart healthy diet such as the DASH diet and exercise as tolerated.  °

## 2015-08-13 NOTE — Assessment & Plan Note (Signed)
Follows with cardiology at [facility] and has had some episodes that were symptomatic but is doing well now.

## 2015-08-13 NOTE — Progress Notes (Signed)
Subjective:    Patient ID: Alexandra Patterson, female    DOB: [birth year], 59 y.o.   MRN: [id number]  Chief Complaint  Patient presents with  . Follow-up    HPI Patient is in today for follow-up. She continues to struggle with insomnia but has a good response to zolpidem. BuSpar is marginally helpful. Had 2 episodes of atrial fibrillation but is following with cardiology at [facility]. Notes a persistent cough times roughly one year but does notice improved somewhat with the treatment of heartburn with Nexium. No fevers or chills. Denies CP/palp/SOB/HA/congestion/fevers/GI or GU c/o. Taking meds as prescribed  Past Medical History  Diagnosis Date  . Internal hemorrhoids   . Hyperplastic colon polyp   . Atrial fibrillation ([place])   . Migraine   . Anxiety   . Depression   . Frequent headaches   . Hx of blood clots     dvt  . UTI (lower urinary tract infection)   . Fainting     once to due heart out of rythm  . Fibromyalgia   . Lesion of lung     xray in 2014 thought to be benign seen at [facility] pulmonology  . Allergy   . GERD (gastroesophageal reflux disease)   . Hyperlipidemia   . Hypertension   . Obesity   . History of chicken pox   . Measles     h/o  . S/P AVR (aortic valve replacement) 03/19/2015  . Cough 08/13/2015  . Insomnia 08/13/2015    Past Surgical History  Procedure Laterality Date  . Tonsillectomy    . Tubal ligation    . Dilation and curettage of uterus      x 3  . Shoulder surgery Left     arthroscopy for spurs  . Atrial ablation surgery    . Loop heart    . Abdominal hysterectomy      TAH SPO    Family History  Problem Relation Age of Onset  . Colon cancer      parent  . Colon polyps Father   . Alzheimer's disease Father   . Alcohol abuse Father   . Arthritis Mother   . Neuropathy Mother   . Hyperlipidemia Mother   . Other Mother     familial mediterranean fever  . Cancer Brother     prostate cancer  . Heart disease Brother    cardiomegaly  . Other Brother     familial mediterranean fever  . Asthma Maternal Grandmother   . Congestive Heart Failure Maternal Grandmother   . Heart disease Maternal Grandmother     chf  . Stroke Maternal Grandfather   . Diabetes Maternal Grandfather   . Heart disease Maternal Grandfather     hardening of the arteries  . Stroke Paternal Grandfather   . Atrial fibrillation Paternal Grandfather   . Alcohol abuse Paternal Grandfather   . Heart disease Paternal Grandfather     afib  . Arthritis Paternal Uncle   . Interstitial cystitis Daughter   . Arthritis Son   . Alcohol abuse Son   . Mental illness Son     depression    Social History   Social History  . Marital Status: Married    Spouse Name: N/A  . Number of Children: 2  . Years of Education: N/A   Occupational History  . Minister    Social History Main Topics  . Smoking status: Never Smoker   . Smokeless tobacco: Never Used  .  Alcohol Use: No  . Drug Use: No  . Sexual Activity: Yes     Comment: lives with husband and adult son, no major dietary restrictions, full diability   Other Topics Concern  . Not on file   Social History Narrative    Outpatient Prescriptions Prior to Visit  Medication Sig Dispense Refill  . acetaminophen-codeine (TYLENOL #3) 300-30 MG per tablet Take by mouth every 4 (four) hours as needed for moderate pain.    . dabigatran (PRADAXA) 150 MG CAPS Take 150 mg by mouth 2 (two) times daily.    . DULoxetine (CYMBALTA) 60 MG capsule Take 1 capsule (60 mg total) by mouth 2 (two) times daily. 60 capsule 6  . lisinopril (PRINIVIL,ZESTRIL) 10 MG tablet Take 10 mg by mouth daily.    . meloxicam (MOBIC) 15 MG tablet Take 15 mg by mouth daily.    . metoprolol succinate (TOPROL-XL) 50 MG 24 hr tablet Take 50 mg by mouth 2 (two) times daily. Take with or immediately following a meal.    . ondansetron (ZOFRAN) 4 MG tablet TAKE 1 TABLET BY MOUTH EVERY 6 TO 8 HOURS AS NEEDED 30 tablet 2  .  ranitidine (ZANTAC) 300 MG tablet Take 1 tablet (300 mg total) by mouth at bedtime. 30 tablet 5  . rizatriptan (MAXALT) 10 MG tablet Take 10 mg by mouth as needed for migraine. May repeat in 2 hours if needed    . simvastatin (ZOCOR) 20 MG tablet Take 20 mg by mouth every evening.    . topiramate (TOPAMAX) 50 MG tablet Takes 75 mg daily    . triamcinolone (NASACORT ALLERGY 24HR) 55 MCG/ACT AERO nasal inhaler Place 2 sprays into the nose 2 (two) times daily.    . busPIRone (BUSPAR) 7.5 MG tablet Take 1 tablet (7.5 mg total) by mouth 3 (three) times daily. 90 tablet 2  . esomeprazole (NEXIUM) 40 MG capsule Take 1 capsule (40 mg total) by mouth daily. 30 capsule 5  . zolpidem (AMBIEN CR) 12.5 MG CR tablet Take 1 tablet (12.5 mg total) by mouth at bedtime as needed for sleep. 30 tablet 0   No facility-administered medications prior to visit.    Allergies  Allergen Reactions  . Sulfa Antibiotics Hives  . Erythromycin Rash  . Prednisone Rash    Review of Systems  Constitutional: Negative for fever and malaise/fatigue.  HENT: Negative for congestion.   Eyes: Negative for discharge.  Respiratory: Positive for cough. Negative for shortness of breath.   Cardiovascular: Negative for chest pain, palpitations and leg swelling.  Gastrointestinal: Negative for nausea and abdominal pain.  Genitourinary: Negative for dysuria.  Musculoskeletal: Negative for falls.  Skin: Negative for rash.  Neurological: Negative for loss of consciousness and headaches.  Endo/Heme/Allergies: Negative for environmental allergies.  Psychiatric/Behavioral: Negative for depression. The patient is not nervous/anxious.        Objective:    Physical Exam  Constitutional: She is oriented to person, place, and time. She appears well-developed and well-nourished. No distress.  HENT:  Head: Normocephalic and atraumatic.  Nose: Nose normal.  Eyes: Right eye exhibits no discharge. Left eye exhibits no discharge.  Neck:  Normal range of motion. Neck supple.  Cardiovascular: Normal rate and regular rhythm.   No murmur heard. Pulmonary/Chest: Effort normal and breath sounds normal.  Abdominal: Soft. Bowel sounds are normal. There is no tenderness.  Musculoskeletal: She exhibits no edema.  Neurological: She is alert and oriented to person, place, and time.  Skin: Skin  is warm and dry.  Psychiatric: She has a normal mood and affect.  Nursing note and vitals reviewed.   BP 126/80 mmHg  Pulse 90  Temp(Src) 98.7 F (37.1 C) (Oral)  Ht 5' 7.5" (1.715 m)  Wt 253 lb (114.76 kg)  BMI 39.02 kg/m2  SpO2 98% Wt Readings from Last 3 Encounters:  08/04/15 253 lb (114.76 kg)  05/26/15 252 lb 8 oz (114.533 kg)  04/25/15 250 lb 9.6 oz (113.671 kg)     Lab Results  Component Value Date   WBC 9.8 03/13/2015   HGB 13.2 03/13/2015   HCT 40.9 03/13/2015   PLT 276.0 03/13/2015   GLUCOSE 84 03/13/2015   CHOL 214* 03/13/2015   TRIG 117.0 03/13/2015   HDL 59.80 03/13/2015   LDLCALC 131* 03/13/2015   ALT 14 03/13/2015   AST 16 03/13/2015   NA 138 03/13/2015   K 3.7 03/13/2015   CL 105 03/13/2015   CREATININE 0.78 03/13/2015   BUN 13 03/13/2015   CO2 25 03/13/2015   TSH 1.16 03/13/2015    Lab Results  Component Value Date   TSH 1.16 03/13/2015   Lab Results  Component Value Date   WBC 9.8 03/13/2015   HGB 13.2 03/13/2015   HCT 40.9 03/13/2015   MCV 86.8 03/13/2015   PLT 276.0 03/13/2015   Lab Results  Component Value Date   NA 138 03/13/2015   K 3.7 03/13/2015   CO2 25 03/13/2015   GLUCOSE 84 03/13/2015   BUN 13 03/13/2015   CREATININE 0.78 03/13/2015   BILITOT 0.6 03/13/2015   ALKPHOS 91 03/13/2015   AST 16 03/13/2015   ALT 14 03/13/2015   PROT 6.8 03/13/2015   ALBUMIN 4.3 03/13/2015   CALCIUM 9.4 03/13/2015   GFR 80.10 03/13/2015   Lab Results  Component Value Date   CHOL 214* 03/13/2015   Lab Results  Component Value Date   HDL 59.80 03/13/2015   Lab Results  Component  Value Date   LDLCALC 131* 03/13/2015   Lab Results  Component Value Date   TRIG 117.0 03/13/2015   Lab Results  Component Value Date   CHOLHDL 4 03/13/2015   No results found for: HGBA1C     Assessment & Plan:   Problem List Items Addressed This Visit    GERD (gastroesophageal reflux disease)    Worsening, increase Nexium to bid again as that helped more, request records from [facility] gastroenterology notes from 2015 endoscopies and notes. Avoid hot brew      Relevant Medications   esomeprazole (NEXIUM) 40 MG capsule   Hyperlipidemia    Encouraged heart healthy diet, increase exercise, avoid trans fats, consider a krill oil cap daily      Hypertension    Well controlled, no changes to meds. Encouraged heart healthy diet such as the DASH diet and exercise as tolerated.       Atrial fibrillation ([place])    Follows with cardiology at [facility] and has had some episodes that were symptomatic but is doing well now.       Obesity    Encouraged DASH diet, decrease po intake and increase exercise as tolerated. Needs 7-8 hours of sleep nightly. Avoid trans fats, eat small, frequent meals every 4-5 hours with lean proteins, complex carbs and healthy fats. Minimize simple carbs, GMO foods.      Acute stress reaction - Primary   Relevant Medications   busPIRone (BUSPAR) 7.5 MG tablet   Cough    Consider switching  from Lisinopril to Losartan patient will discuss with cardiology      Insomnia    Encouraged good sleep hygiene such as dark, quiet room. No blue/green glowing lights such as computer screens in bedroom. No alcohol or stimulants in evening. Cut down on caffeine as able. Regular exercise is helpful but not just prior to bed time. Ambien prn      Relevant Medications   zolpidem (AMBIEN CR) 12.5 MG CR tablet      I have changed Ms. Lipschutz's esomeprazole. I am also having her maintain her dabigatran, meloxicam, simvastatin, ondansetron, acetaminophen-codeine, rizatriptan,  topiramate, triamcinolone, DULoxetine, metoprolol succinate, lisinopril, ranitidine, Flavocoxid-Cit Zn Bisglcinate (LIMBREL500 PO), gabapentin, zolpidem, and busPIRone.  Meds ordered this encounter  Medications  . Flavocoxid-Cit Zn Bisglcinate (LIMBREL500 PO)    Sig: Take by mouth 2 (two) times daily.  Marland Kitchen gabapentin (NEURONTIN) 100 MG capsule    Sig: Take 100 mg by mouth daily.  Marland Kitchen zolpidem (AMBIEN CR) 12.5 MG CR tablet    Sig: Take 1 tablet (12.5 mg total) by mouth at bedtime as needed for sleep.    Dispense:  30 tablet    Refill:  5  . busPIRone (BUSPAR) 7.5 MG tablet    Sig: Take 1 tablet (7.5 mg total) by mouth 3 (three) times daily.    Dispense:  90 tablet    Refill:  2  . esomeprazole (NEXIUM) 40 MG capsule    Sig: Take 1 capsule (40 mg total) by mouth 2 (two) times daily before a meal.    Dispense:  60 capsule    Refill:  5     BLYTH, STACEY, MD

## 2015-08-13 NOTE — Assessment & Plan Note (Signed)
Encouraged heart healthy diet, increase exercise, avoid trans fats, consider a krill oil cap daily 

## 2015-08-13 NOTE — Assessment & Plan Note (Signed)
Consider switching from Lisinopril to Losartan patient will discuss with cardiology

## 2015-08-16 ENCOUNTER — Encounter: Payer: Self-pay | Admitting: Family Medicine

## 2015-08-17 ENCOUNTER — Other Ambulatory Visit: Payer: Self-pay | Admitting: Family Medicine

## 2015-08-17 MED ORDER — LOSARTAN POTASSIUM 25 MG PO TABS: 25.0000 mg | ORAL_TABLET | Freq: Every day | ORAL | Status: DC

## 2015-09-07 ENCOUNTER — Other Ambulatory Visit (HOSPITAL_COMMUNITY)
Admission: RE | Admit: 2015-09-07 | Discharge: 2015-09-07 | Disposition: A | Discharge status: Home / Self Care | Payer: Managed Care, Other (non HMO) | Source: Ambulatory Visit | Attending: Family Medicine | Admitting: Family Medicine

## 2015-09-07 ENCOUNTER — Ambulatory Visit (INDEPENDENT_AMBULATORY_CARE_PROVIDER_SITE_OTHER): Payer: Managed Care, Other (non HMO) | Admitting: Family Medicine

## 2015-09-07 ENCOUNTER — Encounter: Payer: Self-pay | Admitting: Family Medicine

## 2015-09-07 VITALS — BP 138/86 | HR 85 | Temp 98.8°F | Ht 68.0 in | Wt 253.2 lb

## 2015-09-07 DIAGNOSIS — I4891 Unspecified atrial fibrillation: Secondary | ICD-10-CM

## 2015-09-07 DIAGNOSIS — K219 Gastro-esophageal reflux disease without esophagitis: Secondary | ICD-10-CM | POA: Diagnosis not present

## 2015-09-07 DIAGNOSIS — Z124 Encounter for screening for malignant neoplasm of cervix: Secondary | ICD-10-CM | POA: Diagnosis not present

## 2015-09-07 DIAGNOSIS — E669 Obesity, unspecified: Secondary | ICD-10-CM

## 2015-09-07 DIAGNOSIS — I10 Essential (primary) hypertension: Secondary | ICD-10-CM | POA: Diagnosis not present

## 2015-09-07 DIAGNOSIS — Z01419 Encounter for gynecological examination (general) (routine) without abnormal findings: Secondary | ICD-10-CM | POA: Insufficient documentation

## 2015-09-07 DIAGNOSIS — D649 Anemia, unspecified: Secondary | ICD-10-CM

## 2015-09-07 DIAGNOSIS — E785 Hyperlipidemia, unspecified: Secondary | ICD-10-CM

## 2015-09-07 HISTORY — DX: Encounter for screening for malignant neoplasm of cervix: Z12.4

## 2015-09-07 NOTE — Patient Instructions (Signed)
Food Choices for Gastroesophageal Reflux Disease, Adult  When you have gastroesophageal reflux disease (GERD), the foods you eat and your eating habits are very important. Choosing the right foods can help ease your discomfort.   WHAT GUIDELINES DO I NEED TO FOLLOW?   · Choose fruits, vegetables, whole grains, and low-fat dairy products.    · Choose low-fat meat, fish, and poultry.  · Limit fats such as oils, salad dressings, butter, nuts, and avocado.    · Keep a food diary. This helps you identify foods that cause symptoms.    · Avoid foods that cause symptoms. These may be different for everyone.    · Eat small meals often instead of 3 large meals a day.    · Eat your meals slowly, in a place where you are relaxed.    · Limit fried foods.    · Cook foods using methods other than frying.    · Avoid drinking alcohol.    · Avoid drinking large amounts of liquids with your meals.    · Avoid bending over or lying down until 2-3 hours after eating.    WHAT FOODS ARE NOT RECOMMENDED?   These are some foods and drinks that may make your symptoms worse:  Vegetables  Tomatoes. Tomato juice. Tomato and spaghetti sauce. Chili peppers. Onion and garlic. Horseradish.  Fruits  Oranges, grapefruit, and lemon (fruit and juice).  Meats  High-fat meats, fish, and poultry. This includes hot dogs, ribs, ham, sausage, salami, and bacon.  Dairy  Whole milk and chocolate milk. Sour cream. Cream. Butter. Ice cream. Cream cheese.   Drinks  Coffee and tea. Bubbly (carbonated) drinks or energy drinks.  Condiments  Hot sauce. Barbecue sauce.   Sweets/Desserts  Chocolate and cocoa. Donuts. Peppermint and spearmint.  Fats and Oils  High-fat foods. This includes French fries and potato chips.  Other  Vinegar. Strong spices. This includes black pepper, white pepper, red pepper, cayenne, curry powder, cloves, ginger, and chili powder.  The items listed above may not be a complete list of foods and drinks to avoid. Contact your dietitian for more  information.     This information is not intended to replace advice given to you by your health care provider. Make sure you discuss any questions you have with your health care provider.     Document Released: 03/17/2012 Document Revised: 10/07/2014 Document Reviewed: 07/21/2013  Elsevier Interactive Patient Education ©2016 [employer]

## 2015-09-07 NOTE — Assessment & Plan Note (Signed)
Pap today, no concerns on exam.  

## 2015-09-08 LAB — CYTOLOGY - PAP

## 2015-09-10 ENCOUNTER — Encounter: Payer: Self-pay | Admitting: Family Medicine

## 2015-09-10 NOTE — Assessment & Plan Note (Signed)
>>  ASSESSMENT AND PLAN FOR CLASS 2 SEVERE OBESITY WITH SERIOUS COMORBIDITY AND BODY MASS INDEX (BMI) OF 36.0 TO 36.9 IN ADULT, UNSPECIFIED OBESITY TYPE (HCC) WRITTEN ON 09/10/2015  1:43 PM BY BLYTH, STACEY A, MD  Encouraged DASH diet, decrease po intake and increase exercise as tolerated. Needs 7-8 hours of sleep nightly. Avoid trans fats, eat small, frequent meals every 4-5 hours with lean proteins, complex carbs and healthy fats. Minimize simple carbs, GMO foods.

## 2015-09-10 NOTE — Assessment & Plan Note (Signed)
Continues to follow at [facility] and tolerates Pradaxa.

## 2015-09-10 NOTE — Assessment & Plan Note (Signed)
Tolerating statin, encouraged heart healthy diet, avoid trans fats, minimize simple carbs and saturated fats. Increase exercise as tolerated 

## 2015-09-10 NOTE — Progress Notes (Signed)
Subjective:    Patient ID: Alexandra Patterson, female    DOB: 1955/12/11, 59 y.o.   MRN: [id number]  Chief Complaint  Patient presents with  . Gynecologic Exam    HPI Patient is in today for follow-up and Pap smear. She feels fairly well today. No recent illness. She continues to struggle with chronic pain from her fibromyalgia but is following with rheumatology at [facility] rheumatology. She continues to follow with [facility] for her history of atrial fibrillation. She has a loop monitor in place which confirmed she has not had an episode of atrial fibrillation in quite some time. She did have a maze procedure in the past. Denies CP/palp/SOB/HA/congestion/fevers/GI or GU c/o. Taking meds as prescribed  Past Medical History  Diagnosis Date  . Internal hemorrhoids   . Hyperplastic colon polyp   . Atrial fibrillation ([place])   . Migraine   . Anxiety   . Depression   . Frequent headaches   . Hx of blood clots     dvt  . UTI (lower urinary tract infection)   . Fainting     once to due heart out of rythm  . Fibromyalgia   . Lesion of lung     xray in 2014 thought to be benign seen at [facility] pulmonology  . Allergy   . GERD (gastroesophageal reflux disease)   . Hyperlipidemia   . Hypertension   . Obesity   . History of chicken pox   . Measles     h/o  . S/P AVR (aortic valve replacement) 03/19/2015  . Cough 08/13/2015  . Insomnia 08/13/2015  . Cervical cancer screening 09/07/2015    Menarche at 11 Irregular and heavy and painful flow secondary to fibroids No history of abnormal pap in past, last pap roughly 2003 G2P2, s/p 2 SVD history of abnormal MGM, 1 abnl bx right breast benign, normal otherwise Noconcerns today TAH b/l SPO for fibroids and migrainesand breast bx on right gyn surgeries    Past Surgical History  Procedure Laterality Date  . Tonsillectomy    . Tubal ligation    . Dilation and curettage of uterus      x 3  . Shoulder surgery Left     arthroscopy  for spurs  . Atrial ablation surgery    . Loop heart    . Abdominal hysterectomy      TAH SPO    Family History  Problem Relation Age of Onset  . Colon cancer      parent  . Colon polyps Father   . Alzheimer's disease Father   . Alcohol abuse Father   . Arthritis Mother   . Neuropathy Mother   . Hyperlipidemia Mother   . Other Mother     familial mediterranean fever  . Cancer Brother     prostate cancer  . Heart disease Brother     cardiomegaly  . Other Brother     familial mediterranean fever  . Asthma Maternal Grandmother   . Congestive Heart Failure Maternal Grandmother   . Heart disease Maternal Grandmother     chf  . Stroke Maternal Grandfather   . Diabetes Maternal Grandfather   . Heart disease Maternal Grandfather     hardening of the arteries  . Stroke Paternal Grandfather   . Atrial fibrillation Paternal Grandfather   . Alcohol abuse Paternal Grandfather   . Heart disease Paternal Grandfather     afib  . Arthritis Paternal Uncle   . Interstitial  cystitis Daughter   . Arthritis Son   . Alcohol abuse Son   . Mental illness Son     depression    Social History   Social History  . Marital Status: Married    Spouse Name: N/A  . Number of Children: 2  . Years of Education: N/A   Occupational History  . Minister    Social History Main Topics  . Smoking status: Never Smoker   . Smokeless tobacco: Never Used  . Alcohol Use: No  . Drug Use: No  . Sexual Activity: Yes     Comment: lives with husband and adult son, no major dietary restrictions, full diability   Other Topics Concern  . Not on file   Social History Narrative    Outpatient Prescriptions Prior to Visit  Medication Sig Dispense Refill  . acetaminophen-codeine (TYLENOL #3) 300-30 MG per tablet Take by mouth every 4 (four) hours as needed for moderate pain.    . busPIRone (BUSPAR) 7.5 MG tablet Take 1 tablet (7.5 mg total) by mouth 3 (three) times daily. 90 tablet 2  . dabigatran  (PRADAXA) 150 MG CAPS Take 150 mg by mouth 2 (two) times daily.    . DULoxetine (CYMBALTA) 60 MG capsule Take 1 capsule (60 mg total) by mouth 2 (two) times daily. 60 capsule 6  . esomeprazole (NEXIUM) 40 MG capsule Take 1 capsule (40 mg total) by mouth 2 (two) times daily before a meal. 60 capsule 5  . Flavocoxid-Cit Zn Bisglcinate (LIMBREL500 PO) Take by mouth 2 (two) times daily.    Marland Kitchen gabapentin (NEURONTIN) 100 MG capsule Take 100 mg by mouth daily.    Marland Kitchen losartan (COZAAR) 25 MG tablet Take 1 tablet (25 mg total) by mouth daily. 30 tablet 3  . meloxicam (MOBIC) 15 MG tablet Take 15 mg by mouth daily.    . metoprolol succinate (TOPROL-XL) 50 MG 24 hr tablet Take 50 mg by mouth 2 (two) times daily. Take with or immediately following a meal.    . ondansetron (ZOFRAN) 4 MG tablet TAKE 1 TABLET BY MOUTH EVERY 6 TO 8 HOURS AS NEEDED 30 tablet 2  . ranitidine (ZANTAC) 300 MG tablet Take 1 tablet (300 mg total) by mouth at bedtime. 30 tablet 5  . rizatriptan (MAXALT) 10 MG tablet Take 10 mg by mouth as needed for migraine. May repeat in 2 hours if needed    . simvastatin (ZOCOR) 20 MG tablet Take 20 mg by mouth every evening.    . topiramate (TOPAMAX) 50 MG tablet Takes 75 mg daily    . triamcinolone (NASACORT ALLERGY 24HR) 55 MCG/ACT AERO nasal inhaler Place 2 sprays into the nose 2 (two) times daily.    Marland Kitchen zolpidem (AMBIEN CR) 12.5 MG CR tablet Take 1 tablet (12.5 mg total) by mouth at bedtime as needed for sleep. 30 tablet 5   No facility-administered medications prior to visit.    Allergies  Allergen Reactions  . Sulfa Antibiotics Hives  . Erythromycin Rash  . Prednisone Rash    Review of Systems  Constitutional: Negative for fever and malaise/fatigue.  HENT: Negative for congestion.   Eyes: Negative for discharge.  Respiratory: Negative for shortness of breath.   Cardiovascular: Negative for chest pain, palpitations and leg swelling.  Gastrointestinal: Negative for nausea and abdominal  pain.  Genitourinary: Negative for dysuria.  Musculoskeletal: Positive for myalgias. Negative for falls.  Skin: Negative for rash.  Neurological: Negative for loss of consciousness and headaches.  Endo/Heme/Allergies:  Negative for environmental allergies.  Psychiatric/Behavioral: Negative for depression. The patient is not nervous/anxious.        Objective:    Physical Exam  Constitutional: She is oriented to person, place, and time. She appears well-developed and well-nourished. No distress.  HENT:  Head: Normocephalic and atraumatic.  Nose: Nose normal.  Eyes: Right eye exhibits no discharge. Left eye exhibits no discharge.  Neck: Normal range of motion. Neck supple.  Cardiovascular: Normal rate.   No murmur heard. Pulmonary/Chest: Effort normal and breath sounds normal.  Abdominal: Soft. Bowel sounds are normal. There is no tenderness.  Musculoskeletal: She exhibits no edema.  Neurological: She is alert and oriented to person, place, and time.  Skin: Skin is warm and dry.  Psychiatric: She has a normal mood and affect.  Nursing note and vitals reviewed.   BP 138/86 mmHg  Pulse 85  Temp(Src) 98.8 F (37.1 C) (Oral)  Ht 5\' 8"  (1.727 m)  Wt 253 lb 4 oz (114.873 kg)  BMI 38.52 kg/m2  SpO2 98% Wt Readings from Last 3 Encounters:  09/07/15 253 lb 4 oz (114.873 kg)  08/04/15 253 lb (114.76 kg)  05/26/15 252 lb 8 oz (114.533 kg)     Lab Results  Component Value Date   WBC 9.8 03/13/2015   HGB 13.2 03/13/2015   HCT 40.9 03/13/2015   PLT 276.0 03/13/2015   GLUCOSE 84 03/13/2015   CHOL 214* 03/13/2015   TRIG 117.0 03/13/2015   HDL 59.80 03/13/2015   LDLCALC 131* 03/13/2015   ALT 14 03/13/2015   AST 16 03/13/2015   NA 138 03/13/2015   K 3.7 03/13/2015   CL 105 03/13/2015   CREATININE 0.78 03/13/2015   BUN 13 03/13/2015   CO2 25 03/13/2015   TSH 1.16 03/13/2015    Lab Results  Component Value Date   TSH 1.16 03/13/2015   Lab Results  Component Value Date     WBC 9.8 03/13/2015   HGB 13.2 03/13/2015   HCT 40.9 03/13/2015   MCV 86.8 03/13/2015   PLT 276.0 03/13/2015   Lab Results  Component Value Date   NA 138 03/13/2015   K 3.7 03/13/2015   CO2 25 03/13/2015   GLUCOSE 84 03/13/2015   BUN 13 03/13/2015   CREATININE 0.78 03/13/2015   BILITOT 0.6 03/13/2015   ALKPHOS 91 03/13/2015   AST 16 03/13/2015   ALT 14 03/13/2015   PROT 6.8 03/13/2015   ALBUMIN 4.3 03/13/2015   CALCIUM 9.4 03/13/2015   GFR 80.10 03/13/2015   Lab Results  Component Value Date   CHOL 214* 03/13/2015   Lab Results  Component Value Date   HDL 59.80 03/13/2015   Lab Results  Component Value Date   LDLCALC 131* 03/13/2015   Lab Results  Component Value Date   TRIG 117.0 03/13/2015   Lab Results  Component Value Date   CHOLHDL 4 03/13/2015   No results found for: HGBA1C     Assessment & Plan:   Problem List Items Addressed This Visit    Anemia   Relevant Orders   CBC   TSH   Comprehensive metabolic panel   Lipid panel   Atrial fibrillation ([place])    Continues to follow at [facility] and tolerates Pradaxa.       Relevant Orders   CBC   TSH   Comprehensive metabolic panel   Lipid panel   Cervical cancer screening - Primary    Pap today, no concerns on exam.  Relevant Orders   Cytology - PAP (Completed)   GERD (gastroesophageal reflux disease)    Avoid offending foods, start probiotics. Do not eat large meals in late evening and consider raising head of bed.       Relevant Orders   CBC   TSH   Comprehensive metabolic panel   Lipid panel   Hyperlipidemia    Tolerating statin, encouraged heart healthy diet, avoid trans fats, minimize simple carbs and saturated fats. Increase exercise as tolerated      Relevant Orders   CBC   TSH   Comprehensive metabolic panel   Lipid panel   Hypertension    Well controlled, no changes to meds. Encouraged heart healthy diet such as the DASH diet and exercise as tolerated.       Relevant  Orders   CBC   TSH   Comprehensive metabolic panel   Lipid panel   Obesity    Encouraged DASH diet, decrease po intake and increase exercise as tolerated. Needs 7-8 hours of sleep nightly. Avoid trans fats, eat small, frequent meals every 4-5 hours with lean proteins, complex carbs and healthy fats. Minimize simple carbs, GMO foods.      Relevant Orders   CBC   TSH   Comprehensive metabolic panel   Lipid panel      I am having Ms. Shippey maintain her dabigatran, meloxicam, simvastatin, ondansetron, acetaminophen-codeine, rizatriptan, topiramate, triamcinolone, DULoxetine, metoprolol succinate, ranitidine, Flavocoxid-Cit Zn Bisglcinate (LIMBREL500 PO), gabapentin, zolpidem, busPIRone, esomeprazole, and losartan.  No orders of the defined types were placed in this encounter.     Penni Homans, MD

## 2015-09-10 NOTE — Assessment & Plan Note (Signed)
Well controlled, no changes to meds. Encouraged heart healthy diet such as the DASH diet and exercise as tolerated.  °

## 2015-09-10 NOTE — Assessment & Plan Note (Signed)
Encouraged DASH diet, decrease po intake and increase exercise as tolerated. Needs 7-8 hours of sleep nightly. Avoid trans fats, eat small, frequent meals every 4-5 hours with lean proteins, complex carbs and healthy fats. Minimize simple carbs, GMO foods. 

## 2015-09-10 NOTE — Assessment & Plan Note (Signed)
Avoid offending foods, start probiotics. Do not eat large meals in late evening and consider raising head of bed.  

## 2015-10-31 ENCOUNTER — Other Ambulatory Visit: Payer: Self-pay | Admitting: Family Medicine

## 2015-10-31 MED ORDER — DULOXETINE HCL 60 MG PO CPEP: 60.0000 mg | ORAL_CAPSULE | Freq: Two times a day (BID) | ORAL | Status: DC

## 2015-11-01 ENCOUNTER — Telehealth: Payer: Self-pay | Admitting: Family Medicine

## 2015-11-01 NOTE — Telephone Encounter (Signed)
Relation to [id number] Call back number:[phone] Pharmacy: [id number] - [place], [place] - [address] AT [address] [phone] (Phone) [phone] (Fax)         Reason for call:  Patient requesting a refill simvastatin (ZOCOR) 20 MG tablet

## 2015-11-02 MED ORDER — SIMVASTATIN 20 MG PO TABS: 20.0000 mg | ORAL_TABLET | Freq: Every evening | ORAL | Status: DC

## 2015-11-02 NOTE — Telephone Encounter (Signed)
Refill done.  

## 2015-11-28 ENCOUNTER — Telehealth: Payer: Self-pay | Admitting: Family Medicine

## 2015-11-28 ENCOUNTER — Ambulatory Visit (INDEPENDENT_AMBULATORY_CARE_PROVIDER_SITE_OTHER): Payer: Managed Care, Other (non HMO) | Admitting: Internal Medicine

## 2015-11-28 ENCOUNTER — Encounter: Payer: Self-pay | Admitting: Internal Medicine

## 2015-11-28 VITALS — BP 128/84 | HR 84 | Temp 98.8°F | Ht 68.0 in | Wt 250.4 lb

## 2015-11-28 DIAGNOSIS — J209 Acute bronchitis, unspecified: Secondary | ICD-10-CM | POA: Diagnosis not present

## 2015-11-28 DIAGNOSIS — R059 Cough, unspecified: Secondary | ICD-10-CM

## 2015-11-28 DIAGNOSIS — R05 Cough: Secondary | ICD-10-CM

## 2015-11-28 DIAGNOSIS — R509 Fever, unspecified: Secondary | ICD-10-CM

## 2015-11-28 LAB — POCT INFLUENZA A/B
Influenza A, POC: NEGATIVE
Influenza B, POC: NEGATIVE

## 2015-11-28 MED ORDER — HYDROCODONE-HOMATROPINE 5-1.5 MG/5ML PO SYRP: 5.0000 mL | ORAL_SOLUTION | Freq: Every evening | ORAL | Status: DC | PRN

## 2015-11-28 MED ORDER — CEFUROXIME AXETIL 500 MG PO TABS: 500.0000 mg | ORAL_TABLET | Freq: Two times a day (BID) | ORAL | Status: DC

## 2015-11-28 NOTE — Progress Notes (Signed)
no   Subjective:    Patient ID: Alexandra Patterson, female    DOB: 11/15/1955, 60 y.o.   MRN: [id number]  DOS:  11/28/2015 Type of visit - description : Acute visit   Interval history: Symptoms started approximately 4 days ago with cough, persistent  at times, causing pain at the site of her chest. Then she developed a sore throat, mild headache, ear congestion, had fever on and off up to 101, decreased with Tylenol. Husband sick with similar symptoms.   Review of Systems Mild sinus pain and congestion with minimal nasal discharge No nausea, vomiting, diarrhea + Sputum production, small amount, green in color, has seen tiny amounts of dried blood in the sputum. No rusty color, no frank hemoptysis  Past Medical History  Diagnosis Date  . Internal hemorrhoids   . Hyperplastic colon polyp   . Atrial fibrillation ([place])   . Migraine   . Anxiety   . Depression   . Frequent headaches   . Hx of blood clots     dvt  . UTI (lower urinary tract infection)   . Fainting     once to due heart out of rythm  . Fibromyalgia   . Lesion of lung     xray in 2014 thought to be benign seen at [facility] pulmonology  . Allergy   . GERD (gastroesophageal reflux disease)   . Hyperlipidemia   . Hypertension   . Obesity   . History of chicken pox   . Measles     h/o  . S/P AVR (aortic valve replacement) 03/19/2015  . Cough 08/13/2015  . Insomnia 08/13/2015  . Cervical cancer screening 09/07/2015    Menarche at 11 Irregular and heavy and painful flow secondary to fibroids No history of abnormal pap in past, last pap roughly 2003 G2P2, s/p 2 SVD history of abnormal MGM, 1 abnl bx right breast benign, normal otherwise Noconcerns today TAH b/l SPO for fibroids and migrainesand breast bx on right gyn surgeries    Past Surgical History  Procedure Laterality Date  . Tonsillectomy    . Tubal ligation    . Dilation and curettage of uterus      x 3  . Shoulder surgery Left     arthroscopy for spurs  .  Atrial ablation surgery    . Loop heart    . Abdominal hysterectomy      TAH SPO    Social History   Social History  . Marital Status: Married    Spouse Name: N/A  . Number of Children: 2  . Years of Education: N/A   Occupational History  . Minister    Social History Main Topics  . Smoking status: Never Smoker   . Smokeless tobacco: Never Used  . Alcohol Use: No  . Drug Use: No  . Sexual Activity: Yes     Comment: lives with husband and adult son, no major dietary restrictions, full diability   Other Topics Concern  . Not on file   Social History Narrative        Medication List       This list is accurate as of: 11/28/15 11:59 PM.  Always use your most recent med list.               busPIRone 7.5 MG tablet  Commonly known as:  BUSPAR  Take 1 tablet (7.5 mg total) by mouth 3 (three) times daily.     cefUROXime 500 MG tablet  Commonly  known as:  CEFTIN  Take 1 tablet (500 mg total) by mouth 2 (two) times daily with a meal.     DULoxetine 60 MG capsule  Commonly known as:  CYMBALTA  Take 1 capsule (60 mg total) by mouth 2 (two) times daily.     esomeprazole 40 MG capsule  Commonly known as:  NEXIUM  Take 1 capsule (40 mg total) by mouth 2 (two) times daily before a meal.     gabapentin 100 MG capsule  Commonly known as:  NEURONTIN  Take 100 mg by mouth daily.     HYDROcodone-homatropine 5-1.5 MG/5ML syrup  Commonly known as:  HYCODAN  Take 5 mLs by mouth at bedtime as needed for cough.     LIMBREL500 PO  Take by mouth 2 (two) times daily.     losartan 25 MG tablet  Commonly known as:  COZAAR  Take 1 tablet (25 mg total) by mouth daily.     meloxicam 15 MG tablet  Commonly known as:  MOBIC  Take 15 mg by mouth daily.     metoprolol succinate 50 MG 24 hr tablet  Commonly known as:  TOPROL-XL  Take 50 mg by mouth 2 (two) times daily. Take with or immediately following a meal.     NASACORT ALLERGY 24HR 55 MCG/ACT Aero nasal inhaler  Generic  drug:  triamcinolone  Place 2 sprays into the nose 2 (two) times daily.     ondansetron 4 MG tablet  Commonly known as:  ZOFRAN  TAKE 1 TABLET BY MOUTH EVERY 6 TO 8 HOURS AS NEEDED     PRADAXA 150 MG Caps capsule  Generic drug:  dabigatran  Take 150 mg by mouth 2 (two) times daily.     rizatriptan 10 MG tablet  Commonly known as:  MAXALT  Take 10 mg by mouth as needed for migraine. May repeat in 2 hours if needed     simvastatin 20 MG tablet  Commonly known as:  ZOCOR  Take 1 tablet (20 mg total) by mouth every evening.     topiramate 50 MG tablet  Commonly known as:  TOPAMAX  Takes 75 mg daily     traMADol 50 MG tablet  Commonly known as:  ULTRAM  Take 50 mg by mouth 2 (two) times daily as needed.     zolpidem 12.5 MG CR tablet  Commonly known as:  AMBIEN CR  Take 1 tablet (12.5 mg total) by mouth at bedtime as needed for sleep.           Objective:   Physical Exam BP 128/84 mmHg  Pulse 84  Temp(Src) 98.8 F (37.1 C) (Oral)  Ht 5\' 8"  (1.727 m)  Wt 250 lb 6 oz (113.569 kg)  BMI 38.08 kg/m2  SpO2 97% General:   Well developed, well nourished . NAD.  HEENT:  Normocephalic . Face symmetric, atraumatic. Nose is slightly congested, sinuses no TTP. TMs normal Throat symmetric. No red. Lungs:  + Rhonchi with cough, no crackles or wheezing. + Large airway congestion, mild Normal respiratory effort, no intercostal retractions, no accessory muscle use. Heart: seems regular,  no murmur.  No pretibial edema bilaterally  Skin: Not pale. Not jaundice Neurologic:  alert & oriented X3.  Speech normal, gait appropriate for age and unassisted Psych--  Cognition and judgment appear intact.  Cooperative with normal attention span and concentration.  Behavior appropriate. No anxious or depressed appearing.      Assessment & Plan:   Bronchitis: Symptoms  started 4 days ago, flu test negative. Will prescribed Ceftin, Mucinex and hydrocodone. Discussed side effects of  hydrocodone and rec not to mix it with Ultram. Call if not gradually better. Call if there is abundant/frank hemoptysis. See AVS

## 2015-11-28 NOTE — Telephone Encounter (Signed)
Pt saw Dr. Larose Kells today (11/28/15).

## 2015-11-28 NOTE — Progress Notes (Signed)
Pre visit review using our clinic review tool, if applicable. No additional management support is needed unless otherwise documented below in the visit note. 

## 2015-11-28 NOTE — Patient Instructions (Signed)
Rest, fluids , tylenol  For cough:  Take Mucinex DM twice a day as needed until better Also hydrocodone at night , watch for drowsiness, don't mix with tramadol  For nasal congestion: Use OTC Nasocort or Flonase : 2 nasal sprays on each side of the nose in the morning until you feel better   Avoid decongestants such as  Pseudoephedrine or phenylephrine    Take the antibiotic as prescribed  (ceftin)  Call if not gradually better over the next  10 days  Call anytime if the symptoms are severe

## 2015-11-28 NOTE — Telephone Encounter (Signed)
Patient Name: Alexandra Patterson DOB: [birth year] Initial Comment Caller states he has body aches, headache, temp of 101, coughing. Nurse Assessment Nurse: Vallery Sa, RN, Cathy Date/Time (Eastern Time): 11/28/2015 7:30:21 AM Confirm and document reason for call. If symptomatic, describe symptoms. You must click the next button to save text entered. ---Caller states she developed Flu symptoms 4-5 days ago (fever 101 oral this morning, muscle aches, headache, sore throat, cough, cold). No severe breathing difficulty. Alert and responsive. No chest pain. Has the patient traveled out of the country within the last 30 days? ---No Does the patient have any new or worsening symptoms? ---Yes Will a triage be completed? ---Yes Related visit to physician within the last 2 weeks? ---No Does the PT have any chronic conditions? (i.e. diabetes, asthma, etc.) ---Yes List chronic conditions. ---Fibromyalga, High Blood Pressure, Loop heart monitor, Heart surgery in the past-Aortic Valve replacement. Is this a behavioral health or substance abuse call? ---No Guidelines Guideline Title Affirmed Question Affirmed Notes Influenza - Seasonal [1] Fever > 101 F (38.3 C) AND [2] age > 35 Final Disposition User See Physician within 4 Hours (or PCP triage) Vallery Sa, RN, Cathy Referrals REFERRED TO PCP OFFICE Disagree/Comply: Comply Scheduled for 9:30am appointment today with Dr. Larose Kells.

## 2015-12-07 ENCOUNTER — Other Ambulatory Visit: Payer: Self-pay | Admitting: Family Medicine

## 2015-12-14 ENCOUNTER — Other Ambulatory Visit: Payer: Self-pay | Admitting: Family Medicine

## 2015-12-14 ENCOUNTER — Encounter: Payer: Self-pay | Admitting: Family Medicine

## 2015-12-14 DIAGNOSIS — F32A Depression, unspecified: Secondary | ICD-10-CM

## 2015-12-14 DIAGNOSIS — F329 Major depressive disorder, single episode, unspecified: Secondary | ICD-10-CM

## 2015-12-15 ENCOUNTER — Encounter: Payer: Self-pay | Admitting: Family Medicine

## 2015-12-20 ENCOUNTER — Ambulatory Visit (INDEPENDENT_AMBULATORY_CARE_PROVIDER_SITE_OTHER): Payer: Managed Care, Other (non HMO) | Admitting: Psychology

## 2015-12-20 DIAGNOSIS — F4323 Adjustment disorder with mixed anxiety and depressed mood: Secondary | ICD-10-CM

## 2016-01-08 ENCOUNTER — Other Ambulatory Visit (INDEPENDENT_AMBULATORY_CARE_PROVIDER_SITE_OTHER): Payer: Managed Care, Other (non HMO)

## 2016-01-08 DIAGNOSIS — D649 Anemia, unspecified: Secondary | ICD-10-CM

## 2016-01-08 DIAGNOSIS — I4891 Unspecified atrial fibrillation: Secondary | ICD-10-CM

## 2016-01-08 DIAGNOSIS — I10 Essential (primary) hypertension: Secondary | ICD-10-CM

## 2016-01-08 DIAGNOSIS — K219 Gastro-esophageal reflux disease without esophagitis: Secondary | ICD-10-CM | POA: Diagnosis not present

## 2016-01-08 DIAGNOSIS — E669 Obesity, unspecified: Secondary | ICD-10-CM | POA: Diagnosis not present

## 2016-01-08 DIAGNOSIS — E785 Hyperlipidemia, unspecified: Secondary | ICD-10-CM | POA: Diagnosis not present

## 2016-01-08 LAB — COMPREHENSIVE METABOLIC PANEL
ALK PHOS: 82 U/L (ref 39–117)
ALT: 11 U/L (ref 0–35)
AST: 13 U/L (ref 0–37)
Albumin: 3.8 g/dL (ref 3.5–5.2)
BILIRUBIN TOTAL: 0.5 mg/dL (ref 0.2–1.2)
BUN: 23 mg/dL (ref 6–23)
CALCIUM: 9.4 mg/dL (ref 8.4–10.5)
CO2: 26 meq/L (ref 19–32)
Chloride: 108 mEq/L (ref 96–112)
Creatinine, Ser: 0.78 mg/dL (ref 0.40–1.20)
GFR: 79.88 mL/min (ref >60.00)
GLUCOSE: 91 mg/dL (ref 70–99)
POTASSIUM: 3.9 meq/L (ref 3.5–5.1)
Sodium: 142 mEq/L (ref 135–145)
Total Protein: 6.4 g/dL (ref 6.0–8.3)

## 2016-01-08 LAB — CBC
HCT: 33.5 % — ABNORMAL LOW (ref 36.0–46.0)
HEMOGLOBIN: 10.9 g/dL — AB (ref 12.0–15.0)
MCHC: 32.5 g/dL (ref 30.0–36.0)
MCV: 79.5 fl (ref 78.0–100.0)
PLATELETS: 266 10*3/uL (ref 150.0–400.0)
RBC: 4.22 Mil/uL (ref 3.87–5.11)
RDW: 17 % — ABNORMAL HIGH (ref 11.5–15.5)
WBC: 6.1 10*3/uL (ref 4.0–10.5)

## 2016-01-08 LAB — LIPID PANEL
CHOL/HDL RATIO: 3
Cholesterol: 172 mg/dL (ref 0–200)
HDL: 55.1 mg/dL (ref >39.00)
LDL Cholesterol: 96 mg/dL (ref 0–99)
NONHDL: 116.42
TRIGLYCERIDES: 100 mg/dL (ref 0.0–149.0)
VLDL: 20 mg/dL (ref 0.0–40.0)

## 2016-01-08 LAB — TSH: TSH: 0.08 u[IU]/mL — AB (ref 0.35–4.50)

## 2016-01-15 ENCOUNTER — Ambulatory Visit (INDEPENDENT_AMBULATORY_CARE_PROVIDER_SITE_OTHER): Payer: Managed Care, Other (non HMO) | Admitting: Psychology

## 2016-01-15 DIAGNOSIS — F4323 Adjustment disorder with mixed anxiety and depressed mood: Secondary | ICD-10-CM | POA: Diagnosis not present

## 2016-01-16 ENCOUNTER — Encounter: Payer: Self-pay | Admitting: Family Medicine

## 2016-01-16 ENCOUNTER — Ambulatory Visit (INDEPENDENT_AMBULATORY_CARE_PROVIDER_SITE_OTHER): Payer: Managed Care, Other (non HMO) | Admitting: Family Medicine

## 2016-01-16 VITALS — BP 132/88 | HR 94 | Temp 98.7°F | Ht 68.0 in | Wt 250.4 lb

## 2016-01-16 DIAGNOSIS — D649 Anemia, unspecified: Secondary | ICD-10-CM

## 2016-01-16 DIAGNOSIS — K219 Gastro-esophageal reflux disease without esophagitis: Secondary | ICD-10-CM

## 2016-01-16 DIAGNOSIS — E669 Obesity, unspecified: Secondary | ICD-10-CM

## 2016-01-16 DIAGNOSIS — E785 Hyperlipidemia, unspecified: Secondary | ICD-10-CM

## 2016-01-16 DIAGNOSIS — I10 Essential (primary) hypertension: Secondary | ICD-10-CM

## 2016-01-16 DIAGNOSIS — E0590 Thyrotoxicosis, unspecified without thyrotoxic crisis or storm: Secondary | ICD-10-CM

## 2016-01-16 MED ORDER — METOPROLOL SUCCINATE ER 50 MG PO TB24: 50.0000 mg | ORAL_TABLET | Freq: Two times a day (BID) | ORAL | Status: DC

## 2016-01-16 NOTE — Progress Notes (Signed)
Subjective:    Patient ID: Alexandra Patterson, female    DOB: Sep 14, 1956, 60 y.o.   MRN: [id number]  Chief Complaint  Patient presents with  . Follow-up  ,  HPI Patient is in today for follow up and some concerns with left thumb that is purple and cold, has not done anything to cause it to appear this way woke up and it appeared purple and cold.  Appears to be raynaud phenomenon, patient also reports having cold feet that has been ongoing for a while now which caused some numnbness and tinglng in feet. Denies CP/palp/SOB/HA/congestion/fevers/GI or GU c/o. Taking meds as prescribed  Past Medical History  Diagnosis Date  . Internal hemorrhoids   . Hyperplastic colon polyp   . Atrial fibrillation ([place])   . Migraine   . Anxiety   . Depression   . Frequent headaches   . Hx of blood clots     dvt  . UTI (lower urinary tract infection)   . Fainting     once to due heart out of rythm  . Fibromyalgia   . Lesion of lung     xray in 2014 thought to be benign seen at [facility] pulmonology  . Allergy   . GERD (gastroesophageal reflux disease)   . Hyperlipidemia   . Hypertension   . Obesity   . History of chicken pox   . Measles     h/o  . S/P AVR (aortic valve replacement) 03/19/2015  . Cough 08/13/2015  . Insomnia 08/13/2015  . Cervical cancer screening 09/07/2015    Menarche at 11 Irregular and heavy and painful flow secondary to fibroids No history of abnormal pap in past, last pap roughly 2003 G2P2, s/p 2 SVD history of abnormal MGM, 1 abnl bx right breast benign, normal otherwise Noconcerns today TAH b/l SPO for fibroids and migrainesand breast bx on right gyn surgeries    Past Surgical History  Procedure Laterality Date  . Tonsillectomy    . Tubal ligation    . Dilation and curettage of uterus      x 3  . Shoulder surgery Left     arthroscopy for spurs  . Atrial ablation surgery    . Loop heart    . Abdominal hysterectomy      TAH SPO    Family History  Problem  Relation Age of Onset  . Colon cancer      parent  . Colon polyps Father   . Alzheimer's disease Father   . Alcohol abuse Father   . Arthritis Mother   . Neuropathy Mother   . Hyperlipidemia Mother   . Other Mother     familial mediterranean fever  . Cancer Brother     prostate cancer  . Heart disease Brother     cardiomegaly  . Other Brother     familial mediterranean fever  . Asthma Maternal Grandmother   . Congestive Heart Failure Maternal Grandmother   . Heart disease Maternal Grandmother     chf  . Stroke Maternal Grandfather   . Diabetes Maternal Grandfather   . Heart disease Maternal Grandfather     hardening of the arteries  . Stroke Paternal Grandfather   . Atrial fibrillation Paternal Grandfather   . Alcohol abuse Paternal Grandfather   . Heart disease Paternal Grandfather     afib  . Arthritis Paternal Uncle   . Interstitial cystitis Daughter   . Arthritis Son   . Alcohol abuse Son   .  Mental illness Son     depression    Social History   Social History  . Marital Status: Married    Spouse Name: N/A  . Number of Children: 2  . Years of Education: N/A   Occupational History  . Minister    Social History Main Topics  . Smoking status: Never Smoker   . Smokeless tobacco: Never Used  . Alcohol Use: No  . Drug Use: No  . Sexual Activity: Yes     Comment: lives with husband and adult son, no major dietary restrictions, full diability   Other Topics Concern  . Not on file   Social History Narrative    Outpatient Prescriptions Prior to Visit  Medication Sig Dispense Refill  . busPIRone (BUSPAR) 7.5 MG tablet Take 1 tablet (7.5 mg total) by mouth 3 (three) times daily. 90 tablet 2  . dabigatran (PRADAXA) 150 MG CAPS Take 150 mg by mouth 2 (two) times daily.    . DULoxetine (CYMBALTA) 60 MG capsule Take 1 capsule (60 mg total) by mouth 2 (two) times daily. 60 capsule 3  . esomeprazole (NEXIUM) 40 MG capsule Take 1 capsule (40 mg total) by mouth 2  (two) times daily before a meal. 60 capsule 5  . gabapentin (NEURONTIN) 100 MG capsule Take 100 mg by mouth daily.    Marland Kitchen losartan (COZAAR) 25 MG tablet TAKE 1 TABLET(25 MG) BY MOUTH DAILY 30 tablet 3  . meloxicam (MOBIC) 15 MG tablet Take 15 mg by mouth daily.    . ondansetron (ZOFRAN) 4 MG tablet TAKE 1 TABLET BY MOUTH EVERY 6 TO 8 HOURS AS NEEDED 30 tablet 2  . rizatriptan (MAXALT) 10 MG tablet Take 10 mg by mouth as needed for migraine. May repeat in 2 hours if needed    . simvastatin (ZOCOR) 20 MG tablet Take 1 tablet (20 mg total) by mouth every evening. 30 tablet 6  . topiramate (TOPAMAX) 50 MG tablet Takes 75 mg daily    . traMADol (ULTRAM) 50 MG tablet Take 50 mg by mouth 2 (two) times daily as needed.    . triamcinolone (NASACORT ALLERGY 24HR) 55 MCG/ACT AERO nasal inhaler Place 2 sprays into the nose 2 (two) times daily.    Marland Kitchen zolpidem (AMBIEN CR) 12.5 MG CR tablet Take 1 tablet (12.5 mg total) by mouth at bedtime as needed for sleep. 30 tablet 5  . metoprolol succinate (TOPROL-XL) 50 MG 24 hr tablet Take 50 mg by mouth 2 (two) times daily. Take with or immediately following a meal.    . cefUROXime (CEFTIN) 500 MG tablet Take 1 tablet (500 mg total) by mouth 2 (two) times daily with a meal. 14 tablet 0  . Flavocoxid-Cit Zn Bisglcinate (LIMBREL500 PO) Take by mouth 2 (two) times daily.    Marland Kitchen HYDROcodone-homatropine (HYCODAN) 5-1.5 MG/5ML syrup Take 5 mLs by mouth at bedtime as needed for cough. 120 mL 0   No facility-administered medications prior to visit.    Allergies  Allergen Reactions  . Sulfa Antibiotics Hives  . Erythromycin Rash  . Prednisone Rash    Review of Systems  Constitutional: Negative for fever and malaise/fatigue.  HENT: Negative for congestion.   Eyes: Negative for blurred vision.  Respiratory: Negative for shortness of breath.   Cardiovascular: Negative for chest pain, palpitations and leg swelling.  Gastrointestinal: Positive for constipation. Negative for  nausea, abdominal pain and blood in stool.  Genitourinary: Positive for frequency. Negative for dysuria.  Musculoskeletal: Negative for falls.  Skin: Negative for rash.  Neurological: Positive for tremors. Negative for dizziness, loss of consciousness and headaches.  Endo/Heme/Allergies: Negative for environmental allergies.  Psychiatric/Behavioral: Negative for depression. The patient is nervous/anxious.        Objective:    Physical Exam  Constitutional: She is oriented to person, place, and time. She appears well-developed and well-nourished. No distress.  HENT:  Head: Normocephalic and atraumatic.  Nose: Nose normal.  Eyes: Right eye exhibits no discharge. Left eye exhibits no discharge.  Neck: Normal range of motion. Neck supple.  Cardiovascular: Normal rate, regular rhythm and intact distal pulses.   No murmur heard. Pulmonary/Chest: Effort normal and breath sounds normal.  Abdominal: Soft. Bowel sounds are normal. There is no tenderness.  Musculoskeletal: She exhibits no edema.  Neurological: She is alert and oriented to person, place, and time.  Skin: Skin is warm and dry.  Psychiatric: She has a normal mood and affect.  Nursing note and vitals reviewed.   BP 132/88 mmHg  Pulse 94  Temp(Src) 98.7 F (37.1 C) (Oral)  Ht 5\' 8"  (1.727 m)  Wt 250 lb 6 oz (113.569 kg)  BMI 38.08 kg/m2  SpO2 95% Wt Readings from Last 3 Encounters:  01/18/16 250 lb 9.6 oz (113.671 kg)  01/16/16 250 lb 6 oz (113.569 kg)  11/28/15 250 lb 6 oz (113.569 kg)     Lab Results  Component Value Date   WBC 6.1 01/08/2016   HGB 10.9* 01/08/2016   HCT 33.5* 01/08/2016   PLT 266.0 01/08/2016   GLUCOSE 91 01/08/2016   CHOL 172 01/08/2016   TRIG 100.0 01/08/2016   HDL 55.10 01/08/2016   LDLCALC 96 01/08/2016   ALT 11 01/08/2016   AST 13 01/08/2016   NA 142 01/08/2016   K 3.9 01/08/2016   CL 108 01/08/2016   CREATININE 0.78 01/08/2016   BUN 23 01/08/2016   CO2 26 01/08/2016   TSH  0.08* 01/08/2016    Lab Results  Component Value Date   TSH 0.08* 01/08/2016   Lab Results  Component Value Date   WBC 6.1 01/08/2016   HGB 10.9* 01/08/2016   HCT 33.5* 01/08/2016   MCV 79.5 01/08/2016   PLT 266.0 01/08/2016   Lab Results  Component Value Date   NA 142 01/08/2016   K 3.9 01/08/2016   CO2 26 01/08/2016   GLUCOSE 91 01/08/2016   BUN 23 01/08/2016   CREATININE 0.78 01/08/2016   BILITOT 0.5 01/08/2016   ALKPHOS 82 01/08/2016   AST 13 01/08/2016   ALT 11 01/08/2016   PROT 6.4 01/08/2016   ALBUMIN 3.8 01/08/2016   CALCIUM 9.4 01/08/2016   GFR 79.88 01/08/2016   Lab Results  Component Value Date   CHOL 172 01/08/2016   Lab Results  Component Value Date   HDL 55.10 01/08/2016   Lab Results  Component Value Date   LDLCALC 96 01/08/2016   Lab Results  Component Value Date   TRIG 100.0 01/08/2016   Lab Results  Component Value Date   CHOLHDL 3 01/08/2016   No results found for: HGBA1C     Assessment & Plan:   Problem List Items Addressed This Visit    Anemia    Increase leafy greens, consider increased lean red meat and using cast iron cookware. Continue to monitor, report any concerns      GERD (gastroesophageal reflux disease)    Avoid offending foods, start probiotics. Do not eat large meals in late evening and consider raising head  of bed.       Hyperlipidemia - Primary    Tolerating statin, encouraged heart healthy diet, avoid trans fats, minimize simple carbs and saturated fats. Increase exercise as tolerated      Hypertension    Well controlled, no changes to meds. Encouraged heart healthy diet such as the DASH diet and exercise as tolerated.       Obesity    Encouraged DASH diet, decrease po intake and increase exercise as tolerated. Needs 7-8 hours of sleep nightly. Avoid trans fats, eat small, frequent meals every 4-5 hours with lean proteins, complex carbs and healthy fats. Minimize simple carbs, GMO foods.       Other  Visit Diagnoses    Hyperthyroidism        Relevant Orders    Ambulatory referral to Endocrinology    T4, free (Completed)       I have discontinued Ms. Sandler's metoprolol succinate, Flavocoxid-Cit Zn Bisglcinate (LIMBREL500 PO), cefUROXime, and HYDROcodone-homatropine. I am also having her maintain her dabigatran, meloxicam, ondansetron, rizatriptan, topiramate, triamcinolone, gabapentin, zolpidem, busPIRone, esomeprazole, DULoxetine, simvastatin, traMADol, losartan, KRILL OIL PO, Magnesium, and Vitamin D3.  Meds ordered this encounter  Medications  . KRILL OIL PO    Sig: Take 1 mg by mouth daily.  . Magnesium 400 MG CAPS    Sig: Take 400 mg by mouth daily.  . Cholecalciferol (VITAMIN D3) 5000 units CAPS    Sig: Take 5,000 Units by mouth daily.  Marland Kitchen DISCONTD: metoprolol succinate (TOPROL-XL) 50 MG 24 hr tablet    Sig: Take 1 tablet (50 mg total) by mouth 2 (two) times daily. Take with or immediately following a meal.    Dispense:  60 tablet    Refill:  2     Penni Homans, MD

## 2016-01-16 NOTE — Patient Instructions (Signed)

## 2016-01-16 NOTE — Progress Notes (Signed)
Pre visit review using our clinic review tool, if applicable. No additional management support is needed unless otherwise documented below in the visit note. 

## 2016-01-17 ENCOUNTER — Encounter: Payer: Self-pay | Admitting: Family Medicine

## 2016-01-17 LAB — T4, FREE: FREE T4: 2.35 ng/dL — AB (ref 0.60–1.60)

## 2016-01-18 ENCOUNTER — Ambulatory Visit (INDEPENDENT_AMBULATORY_CARE_PROVIDER_SITE_OTHER): Payer: Managed Care, Other (non HMO) | Admitting: Family Medicine

## 2016-01-18 ENCOUNTER — Other Ambulatory Visit: Payer: Self-pay | Admitting: Family Medicine

## 2016-01-18 VITALS — BP 130/100 | HR 96 | Temp 98.2°F | Ht 68.0 in | Wt 250.6 lb

## 2016-01-18 DIAGNOSIS — R300 Dysuria: Secondary | ICD-10-CM | POA: Diagnosis not present

## 2016-01-18 DIAGNOSIS — N390 Urinary tract infection, site not specified: Secondary | ICD-10-CM

## 2016-01-18 DIAGNOSIS — R350 Frequency of micturition: Secondary | ICD-10-CM

## 2016-01-18 DIAGNOSIS — R82998 Other abnormal findings in urine: Secondary | ICD-10-CM

## 2016-01-18 LAB — POCT URINALYSIS DIPSTICK
Bilirubin, UA: NEGATIVE
Glucose, UA: NEGATIVE
KETONES UA: NEGATIVE
NITRITE UA: NEGATIVE
PH UA: 5.5
PROTEIN UA: NEGATIVE
RBC UA: NEGATIVE
Spec Grav, UA: >=1.03
Urobilinogen, UA: 4

## 2016-01-18 MED ORDER — NITROFURANTOIN MONOHYD MACRO 100 MG PO CAPS: 100.0000 mg | ORAL_CAPSULE | Freq: Two times a day (BID) | ORAL | Status: DC

## 2016-01-18 MED ORDER — PHENAZOPYRIDINE HCL 100 MG PO TABS: 100.0000 mg | ORAL_TABLET | Freq: Three times a day (TID) | ORAL | Status: DC | PRN

## 2016-01-18 MED ORDER — ALPRAZOLAM 0.25 MG PO TABS: 0.2500 mg | ORAL_TABLET | Freq: Two times a day (BID) | ORAL | Status: DC | PRN

## 2016-01-18 MED ORDER — METOPROLOL SUCCINATE ER 100 MG PO TB24: 100.0000 mg | ORAL_TABLET | Freq: Two times a day (BID) | ORAL | Status: DC

## 2016-01-18 NOTE — Telephone Encounter (Signed)
Faxed hardcopy for Alprazolam to [facility] in [place] and patient notified.

## 2016-01-18 NOTE — Progress Notes (Signed)
Pre visit review using our clinic tool,if applicable. No additional management support is needed unless otherwise documented below in the visit note.  

## 2016-01-18 NOTE — Patient Instructions (Signed)
We are going to treat you for a UTI with macrobid.  Continue to use pyridium as needed. Drink plenty of liquids and let me know if you are not feeling better in the next couple of days- Sooner if worse.   I will culture your urine and will let you know if we need to change your antibiotic

## 2016-01-18 NOTE — Progress Notes (Signed)
[place] at [facility] [address] [place], [place] [address] [phone]  Date:  01/18/2016   Name:  Alexandra Patterson   DOB:  1956/01/25   MRN:  [id number]  PCP:  Penni Homans, MD    Chief Complaint: Urinary Frequency   History of Present Illness:  Alexandra Patterson is a 60 y.o. very pleasant female patient who presents with the following:  Here today with concern of a UTI.  She is not quite sure when her sx began, but she has noted urinary frequney for 2-3 weeks. Burning started last night- she used some pyruidium which did help She has had UTI several times in the past and this does seem like a UTI to her She notes lower abd pain and her back aches She has not noted any blood in her urine. No vomiting but she has felt a bit nauseated  She is now using metoprolol for her hyperactive thyroid  Lab Results  Component Value Date   TSH 0.08* 01/08/2016   She was just dx with the overacitve thyroid recently and plans to see endocrinology in a couple of weeks She has a history of a fib- is on a chronic blood thinner- she is generally able to tell when she is in a fib and has not noted any sx of same  Patient Active Problem List   Diagnosis Date Noted  . Cervical cancer screening 09/07/2015  . Cough 08/13/2015  . Insomnia 08/13/2015  . Acute stress reaction 04/25/2015  . S/P AVR (aortic valve replacement) 03/19/2015  . Anemia 03/19/2015  . Fibromyalgia   . Lesion of lung   . Allergy   . GERD (gastroesophageal reflux disease)   . Hyperlipidemia   . Hypertension   . Migraine   . Hyperplastic colon polyp   . Atrial fibrillation ([place])   . Hx of blood clots   . Obesity   . History of chicken pox     Past Medical History  Diagnosis Date  . Internal hemorrhoids   . Hyperplastic colon polyp   . Atrial fibrillation ([place])   . Migraine   . Anxiety   . Depression   . Frequent headaches   . Hx of blood clots     dvt  . UTI  (lower urinary tract infection)   . Fainting     once to due heart out of rythm  . Fibromyalgia   . Lesion of lung     xray in 2014 thought to be benign seen at [facility] pulmonology  . Allergy   . GERD (gastroesophageal reflux disease)   . Hyperlipidemia   . Hypertension   . Obesity   . History of chicken pox   . Measles     h/o  . S/P AVR (aortic valve replacement) 03/19/2015  . Cough 08/13/2015  . Insomnia 08/13/2015  . Cervical cancer screening 09/07/2015    Menarche at 11 Irregular and heavy and painful flow secondary to fibroids No history of abnormal pap in past, last pap roughly 2003 G2P2, s/p 2 SVD history of abnormal MGM, 1 abnl bx right breast benign, normal otherwise Noconcerns today TAH b/l SPO for fibroids and migrainesand breast bx on right gyn surgeries    Past Surgical History  Procedure Laterality Date  . Tonsillectomy    . Tubal ligation    . Dilation and curettage of uterus      x 3  . Shoulder surgery  Left     arthroscopy for spurs  . Atrial ablation surgery    . Loop heart    . Abdominal hysterectomy      TAH SPO    Social History  Substance Use Topics  . Smoking status: Never Smoker   . Smokeless tobacco: Never Used  . Alcohol Use: No    Family History  Problem Relation Age of Onset  . Colon cancer      parent  . Colon polyps Father   . Alzheimer's disease Father   . Alcohol abuse Father   . Arthritis Mother   . Neuropathy Mother   . Hyperlipidemia Mother   . Other Mother     familial mediterranean fever  . Cancer Brother     prostate cancer  . Heart disease Brother     cardiomegaly  . Other Brother     familial mediterranean fever  . Asthma Maternal Grandmother   . Congestive Heart Failure Maternal Grandmother   . Heart disease Maternal Grandmother     chf  . Stroke Maternal Grandfather   . Diabetes Maternal Grandfather   . Heart disease Maternal Grandfather     hardening of the arteries  . Stroke Paternal Grandfather   .  Atrial fibrillation Paternal Grandfather   . Alcohol abuse Paternal Grandfather   . Heart disease Paternal Grandfather     afib  . Arthritis Paternal Uncle   . Interstitial cystitis Daughter   . Arthritis Son   . Alcohol abuse Son   . Mental illness Son     depression    Allergies  Allergen Reactions  . Sulfa Antibiotics Hives  . Erythromycin Rash  . Prednisone Rash    Medication list has been reviewed and updated.  Current Outpatient Prescriptions on File Prior to Visit  Medication Sig Dispense Refill  . busPIRone (BUSPAR) 7.5 MG tablet Take 1 tablet (7.5 mg total) by mouth 3 (three) times daily. 90 tablet 2  . Cholecalciferol (VITAMIN D3) 5000 units CAPS Take 5,000 Units by mouth daily.    . dabigatran (PRADAXA) 150 MG CAPS Take 150 mg by mouth 2 (two) times daily.    . DULoxetine (CYMBALTA) 60 MG capsule Take 1 capsule (60 mg total) by mouth 2 (two) times daily. 60 capsule 3  . esomeprazole (NEXIUM) 40 MG capsule Take 1 capsule (40 mg total) by mouth 2 (two) times daily before a meal. 60 capsule 5  . gabapentin (NEURONTIN) 100 MG capsule Take 100 mg by mouth daily.    Marland Kitchen KRILL OIL PO Take 1 mg by mouth daily.    Marland Kitchen losartan (COZAAR) 25 MG tablet TAKE 1 TABLET(25 MG) BY MOUTH DAILY 30 tablet 3  . Magnesium 400 MG CAPS Take 400 mg by mouth daily.    . meloxicam (MOBIC) 15 MG tablet Take 15 mg by mouth daily.    . metoprolol succinate (TOPROL-XL) 50 MG 24 hr tablet Take 1 tablet (50 mg total) by mouth 2 (two) times daily. Take with or immediately following a meal. 60 tablet 2  . ondansetron (ZOFRAN) 4 MG tablet TAKE 1 TABLET BY MOUTH EVERY 6 TO 8 HOURS AS NEEDED 30 tablet 2  . rizatriptan (MAXALT) 10 MG tablet Take 10 mg by mouth as needed for migraine. May repeat in 2 hours if needed    . simvastatin (ZOCOR) 20 MG tablet Take 1 tablet (20 mg total) by mouth every evening. 30 tablet 6  . topiramate (TOPAMAX) 50 MG tablet Takes 75  mg daily    . traMADol (ULTRAM) 50 MG tablet Take  50 mg by mouth 2 (two) times daily as needed.    . triamcinolone (NASACORT ALLERGY 24HR) 55 MCG/ACT AERO nasal inhaler Place 2 sprays into the nose 2 (two) times daily.    Marland Kitchen zolpidem (AMBIEN CR) 12.5 MG CR tablet Take 1 tablet (12.5 mg total) by mouth at bedtime as needed for sleep. 30 tablet 5   No current facility-administered medications on file prior to visit.    Review of Systems:  As per HPI- otherwise negative.   Physical Examination: Filed Vitals:   01/18/16 1341  BP: 130/100  Pulse: 111  Temp: 98.2 F (36.8 C)   Filed Vitals:   01/18/16 1341  Height: 5\' 8"  (1.727 m)  Weight: 250 lb 9.6 oz (113.671 kg)   Body mass index is 38.11 kg/(m^2). Ideal Body Weight: Weight in (lb) to have BMI = 25: 164.1  GEN: WDWN, NAD, Non-toxic, A & O x 3, obese, looks well HEENT: Atraumatic, Normocephalic. Neck supple. No masses, No LAD. Ears and Nose: No external deformity. CV: RRR- slightly fast but does not seem to be in fib, No M/G/R. No JVD. No thrill. No extra heart sounds. PULM: CTA B, no wheezes, crackles, rhonchi. No retractions. No resp. distress. No accessory muscle use. ABD: S, NT, ND. No rebound. No HSM.  No CVA tenderness, benign belly EXTR: No c/c/e NEURO Normal gait.  PSYCH: Normally interactive. Conversant. Not depressed or anxious appearing.  Calm demeanor.    Assessment and Plan: Urinary frequency - Plan: POCT Urinalysis Dipstick, nitrofurantoin, macrocrystal-monohydrate, (MACROBID) 100 MG capsule  Leukocytes in urine - Plan: Urine Culture, nitrofurantoin, macrocrystal-monohydrate, (MACROBID) 100 MG capsule  Dysuria - Plan: phenazopyridine (PYRIDIUM) 100 MG tablet  Treat for a presumed UTI with macrobid, pyridium prn. Await culture She will be on the look out for any sx of a fib as this can be a consequence of hyperthyroidism  Signed Lamar Blinks, MD

## 2016-01-20 LAB — URINE CULTURE: Colony Count: >=100000

## 2016-01-28 NOTE — Assessment & Plan Note (Signed)
>>  ASSESSMENT AND PLAN FOR CLASS 2 SEVERE OBESITY WITH SERIOUS COMORBIDITY AND BODY MASS INDEX (BMI) OF 36.0 TO 36.9 IN ADULT, UNSPECIFIED OBESITY TYPE (HCC) WRITTEN ON 01/28/2016  9:24 PM BY BLYTH, STACEY A, MD  Encouraged DASH diet, decrease po intake and increase exercise as tolerated. Needs 7-8 hours of sleep nightly. Avoid trans fats, eat small, frequent meals every 4-5 hours with lean proteins, complex carbs and healthy fats. Minimize simple carbs, GMO foods.

## 2016-01-28 NOTE — Assessment & Plan Note (Signed)
Encouraged DASH diet, decrease po intake and increase exercise as tolerated. Needs 7-8 hours of sleep nightly. Avoid trans fats, eat small, frequent meals every 4-5 hours with lean proteins, complex carbs and healthy fats. Minimize simple carbs, GMO foods. 

## 2016-01-28 NOTE — Assessment & Plan Note (Signed)
Increase leafy greens, consider increased lean red meat and using cast iron cookware. Continue to monitor, report any concerns 

## 2016-01-28 NOTE — Assessment & Plan Note (Signed)
Tolerating statin, encouraged heart healthy diet, avoid trans fats, minimize simple carbs and saturated fats. Increase exercise as tolerated 

## 2016-01-28 NOTE — Assessment & Plan Note (Signed)
Well controlled, no changes to meds. Encouraged heart healthy diet such as the DASH diet and exercise as tolerated.  °

## 2016-01-28 NOTE — Assessment & Plan Note (Signed)
Avoid offending foods, start probiotics. Do not eat large meals in late evening and consider raising head of bed.  

## 2016-01-30 ENCOUNTER — Encounter: Payer: Self-pay | Admitting: Family Medicine

## 2016-01-30 ENCOUNTER — Ambulatory Visit (INDEPENDENT_AMBULATORY_CARE_PROVIDER_SITE_OTHER): Payer: Managed Care, Other (non HMO) | Admitting: Family Medicine

## 2016-01-30 VITALS — BP 132/92 | HR 86 | Temp 98.5°F | Ht 68.0 in | Wt 251.4 lb

## 2016-01-30 DIAGNOSIS — F430 Acute stress reaction: Secondary | ICD-10-CM | POA: Diagnosis not present

## 2016-01-30 DIAGNOSIS — R3915 Urgency of urination: Secondary | ICD-10-CM | POA: Diagnosis not present

## 2016-01-30 DIAGNOSIS — N3000 Acute cystitis without hematuria: Secondary | ICD-10-CM | POA: Diagnosis not present

## 2016-01-30 DIAGNOSIS — R309 Painful micturition, unspecified: Secondary | ICD-10-CM | POA: Diagnosis not present

## 2016-01-30 DIAGNOSIS — I10 Essential (primary) hypertension: Secondary | ICD-10-CM

## 2016-01-30 LAB — POC URINALSYSI DIPSTICK (AUTOMATED)
Bilirubin, UA: NEGATIVE
Glucose, UA: NEGATIVE
Ketones, UA: NEGATIVE
Nitrite, UA: NEGATIVE
PH UA: 6.5
PROTEIN UA: NEGATIVE
RBC UA: POSITIVE
Spec Grav, UA: 1.01
UROBILINOGEN UA: 4

## 2016-01-30 MED ORDER — PHENAZOPYRIDINE HCL 200 MG PO TABS: 200.0000 mg | ORAL_TABLET | Freq: Three times a day (TID) | ORAL | Status: DC | PRN

## 2016-01-30 MED ORDER — CIPROFLOXACIN HCL 500 MG PO TABS: 500.0000 mg | ORAL_TABLET | Freq: Two times a day (BID) | ORAL | Status: DC

## 2016-01-30 MED ORDER — BUTALBITAL-APAP-CAFFEINE 50-325-40 MG PO TABS: 1.0000 | ORAL_TABLET | Freq: Two times a day (BID) | ORAL | Status: DC | PRN

## 2016-01-30 MED ORDER — METOPROLOL SUCCINATE ER 50 MG PO TB24: ORAL_TABLET | ORAL | Status: DC

## 2016-01-30 MED ORDER — BUSPIRONE HCL 7.5 MG PO TABS: 7.5000 mg | ORAL_TABLET | Freq: Three times a day (TID) | ORAL | Status: DC

## 2016-01-30 NOTE — Progress Notes (Signed)
Pre visit review using our clinic review tool, if applicable. No additional management support is needed unless otherwise documented below in the visit note. 

## 2016-01-30 NOTE — Progress Notes (Signed)
Subjective:    Patient ID: Alexandra Patterson, female    DOB: 08/01/1956, 60 y.o.   MRN: [id number]  Chief Complaint  Patient presents with  . Urinary Urgency    frequent urination  . Nausea    HPI Patient is in today for nausea.  Patient also has some concerns with thyroid and is having headaches everyday she wakes up.  Urinary frequency is also noted. Denies CP/palp/SOB/HA/congestion/fevers/GI or GU c/o. Taking meds as prescribed  Past Medical History  Diagnosis Date  . Internal hemorrhoids   . Hyperplastic colon polyp   . Atrial fibrillation ([place])   . Migraine   . Anxiety   . Depression   . Frequent headaches   . Hx of blood clots     dvt  . UTI (lower urinary tract infection)   . Fainting     once to due heart out of rythm  . Fibromyalgia   . Lesion of lung     xray in 2014 thought to be benign seen at [facility] pulmonology  . Allergy   . GERD (gastroesophageal reflux disease)   . Hyperlipidemia   . Hypertension   . Obesity   . History of chicken pox   . Measles     h/o  . S/P AVR (aortic valve replacement) 03/19/2015  . Cough 08/13/2015  . Insomnia 08/13/2015  . Cervical cancer screening 09/07/2015    Menarche at 11 Irregular and heavy and painful flow secondary to fibroids No history of abnormal pap in past, last pap roughly 2003 G2P2, s/p 2 SVD history of abnormal MGM, 1 abnl bx right breast benign, normal otherwise Noconcerns today TAH b/l SPO for fibroids and migrainesand breast bx on right gyn surgeries  . UTI (urinary tract infection) 02/11/2016    Past Surgical History  Procedure Laterality Date  . Tonsillectomy    . Tubal ligation    . Dilation and curettage of uterus      x 3  . Shoulder surgery Left     arthroscopy for spurs  . Atrial ablation surgery    . Loop heart    . Abdominal hysterectomy      TAH SPO    Family History  Problem Relation Age of Onset  . Colon cancer      parent  . Colon polyps Father   . Alzheimer's disease Father   .  Alcohol abuse Father   . Arthritis Mother   . Neuropathy Mother   . Hyperlipidemia Mother   . Other Mother     familial mediterranean fever  . Cancer Brother     prostate cancer  . Heart disease Brother     cardiomegaly  . Other Brother     familial mediterranean fever  . Asthma Maternal Grandmother   . Congestive Heart Failure Maternal Grandmother   . Heart disease Maternal Grandmother     chf  . Stroke Maternal Grandfather   . Diabetes Maternal Grandfather   . Heart disease Maternal Grandfather     hardening of the arteries  . Stroke Paternal Grandfather   . Atrial fibrillation Paternal Grandfather   . Alcohol abuse Paternal Grandfather   . Heart disease Paternal Grandfather     afib  . Arthritis Paternal Uncle   . Interstitial cystitis Daughter   . Arthritis Son   . Alcohol abuse Son   . Mental illness Son     depression    Social History   Social History  . Marital Status:  Married    Spouse Name: N/A  . Number of Children: 2  . Years of Education: N/A   Occupational History  . Minister    Social History Main Topics  . Smoking status: Never Smoker   . Smokeless tobacco: Never Used  . Alcohol Use: No  . Drug Use: No  . Sexual Activity: Yes     Comment: lives with husband and adult son, no major dietary restrictions, full diability   Other Topics Concern  . Not on file   Social History Narrative    Outpatient Prescriptions Prior to Visit  Medication Sig Dispense Refill  . ALPRAZolam (XANAX) 0.25 MG tablet Take 1 tablet (0.25 mg total) by mouth 2 (two) times daily as needed for anxiety. 20 tablet 0  . Cholecalciferol (VITAMIN D3) 5000 units CAPS Take 5,000 Units by mouth daily.    . dabigatran (PRADAXA) 150 MG CAPS Take 150 mg by mouth 2 (two) times daily.    . DULoxetine (CYMBALTA) 60 MG capsule Take 1 capsule (60 mg total) by mouth 2 (two) times daily. 60 capsule 3  . gabapentin (NEURONTIN) 100 MG capsule Take 100 mg by mouth daily.    Marland Kitchen KRILL OIL PO  Take 1 mg by mouth daily.    Marland Kitchen losartan (COZAAR) 25 MG tablet TAKE 1 TABLET(25 MG) BY MOUTH DAILY 30 tablet 3  . Magnesium 400 MG CAPS Take 400 mg by mouth daily.    . meloxicam (MOBIC) 15 MG tablet Take 15 mg by mouth daily.    . metoprolol succinate (TOPROL-XL) 100 MG 24 hr tablet Take 1 tablet (100 mg total) by mouth 2 (two) times daily. Take with or immediately following a meal. 60 tablet 3  . ondansetron (ZOFRAN) 4 MG tablet TAKE 1 TABLET BY MOUTH EVERY 6 TO 8 HOURS AS NEEDED 30 tablet 2  . phenazopyridine (PYRIDIUM) 100 MG tablet Take 1 tablet (100 mg total) by mouth 3 (three) times daily as needed for pain. 30 tablet 0  . rizatriptan (MAXALT) 10 MG tablet Take 10 mg by mouth as needed for migraine. May repeat in 2 hours if needed    . simvastatin (ZOCOR) 20 MG tablet Take 1 tablet (20 mg total) by mouth every evening. 30 tablet 6  . topiramate (TOPAMAX) 50 MG tablet Takes 75 mg daily    . traMADol (ULTRAM) 50 MG tablet Take 50 mg by mouth 2 (two) times daily as needed.    . triamcinolone (NASACORT ALLERGY 24HR) 55 MCG/ACT AERO nasal inhaler Place 2 sprays into the nose 2 (two) times daily.    Marland Kitchen zolpidem (AMBIEN CR) 12.5 MG CR tablet Take 1 tablet (12.5 mg total) by mouth at bedtime as needed for sleep. 30 tablet 5  . busPIRone (BUSPAR) 7.5 MG tablet Take 1 tablet (7.5 mg total) by mouth 3 (three) times daily. 90 tablet 2  . esomeprazole (NEXIUM) 40 MG capsule Take 1 capsule (40 mg total) by mouth 2 (two) times daily before a meal. 60 capsule 5  . nitrofurantoin, macrocrystal-monohydrate, (MACROBID) 100 MG capsule Take 1 capsule (100 mg total) by mouth 2 (two) times daily. 14 capsule 0   No facility-administered medications prior to visit.    Allergies  Allergen Reactions  . Sulfa Antibiotics Hives  . Erythromycin Rash  . Prednisone Rash    Review of Systems  Constitutional: Negative for fever and malaise/fatigue.  HENT: Negative for congestion.   Eyes: Negative for blurred  vision.  Respiratory: Negative for shortness of  breath.   Cardiovascular: Negative for chest pain, palpitations and leg swelling.  Gastrointestinal: Positive for nausea. Negative for abdominal pain and blood in stool.  Genitourinary: Negative for dysuria and frequency.  Musculoskeletal: Negative for falls.  Skin: Negative for rash.  Neurological: Negative for dizziness, loss of consciousness and headaches.  Endo/Heme/Allergies: Negative for environmental allergies.  Psychiatric/Behavioral: Negative for depression. The patient is not nervous/anxious.        Objective:    Physical Exam  Constitutional: She is oriented to person, place, and time. She appears well-developed and well-nourished. No distress.  HENT:  Head: Normocephalic and atraumatic.  Eyes: Conjunctivae are normal.  Neck: Neck supple. No thyromegaly present.  Cardiovascular: Normal rate, regular rhythm and normal heart sounds.   No murmur heard. Pulmonary/Chest: Effort normal and breath sounds normal. No respiratory distress.  Abdominal: Soft. Bowel sounds are normal. She exhibits no distension and no mass. There is no tenderness.  Musculoskeletal: She exhibits no edema.  Lymphadenopathy:    She has no cervical adenopathy.  Neurological: She is alert and oriented to person, place, and time.  Skin: Skin is warm and dry.  Psychiatric: She has a normal mood and affect. Her behavior is normal.    BP 132/92 mmHg  Pulse 86  Temp(Src) 98.5 F (36.9 C) (Oral)  Ht 5\' 8"  (1.727 m)  Wt 251 lb 6 oz (114.023 kg)  BMI 38.23 kg/m2  SpO2 98% Wt Readings from Last 3 Encounters:  01/30/16 251 lb 6 oz (114.023 kg)  01/18/16 250 lb 9.6 oz (113.671 kg)  01/16/16 250 lb 6 oz (113.569 kg)     Lab Results  Component Value Date   WBC 6.1 01/08/2016   HGB 10.9* 01/08/2016   HCT 33.5* 01/08/2016   PLT 266.0 01/08/2016   GLUCOSE 91 01/08/2016   CHOL 172 01/08/2016   TRIG 100.0 01/08/2016   HDL 55.10 01/08/2016   LDLCALC 96  01/08/2016   ALT 11 01/08/2016   AST 13 01/08/2016   NA 142 01/08/2016   K 3.9 01/08/2016   CL 108 01/08/2016   CREATININE 0.78 01/08/2016   BUN 23 01/08/2016   CO2 26 01/08/2016   TSH 0.08* 01/08/2016    Lab Results  Component Value Date   TSH 0.08* 01/08/2016   Lab Results  Component Value Date   WBC 6.1 01/08/2016   HGB 10.9* 01/08/2016   HCT 33.5* 01/08/2016   MCV 79.5 01/08/2016   PLT 266.0 01/08/2016   Lab Results  Component Value Date   NA 142 01/08/2016   K 3.9 01/08/2016   CO2 26 01/08/2016   GLUCOSE 91 01/08/2016   BUN 23 01/08/2016   CREATININE 0.78 01/08/2016   BILITOT 0.5 01/08/2016   ALKPHOS 82 01/08/2016   AST 13 01/08/2016   ALT 11 01/08/2016   PROT 6.4 01/08/2016   ALBUMIN 3.8 01/08/2016   CALCIUM 9.4 01/08/2016   GFR 79.88 01/08/2016   Lab Results  Component Value Date   CHOL 172 01/08/2016   Lab Results  Component Value Date   HDL 55.10 01/08/2016   Lab Results  Component Value Date   LDLCALC 96 01/08/2016   Lab Results  Component Value Date   TRIG 100.0 01/08/2016   Lab Results  Component Value Date   CHOLHDL 3 01/08/2016   No results found for: HGBA1C     Assessment & Plan:   Problem List Items Addressed This Visit    UTI (urinary tract infection)    EColi Uti  started on antibiotics and plenty of fluids and she will call if symptoms do not resolve      Relevant Medications   phenazopyridine (PYRIDIUM) 200 MG tablet   Hypertension    Denies CP/palp/SOB/HA/congestion/fevers/GI or GU c/o. Taking meds as prescribed      Relevant Medications   metoprolol succinate (TOPROL-XL) 50 MG 24 hr tablet   Acute stress reaction   Relevant Medications   busPIRone (BUSPAR) 7.5 MG tablet    Other Visit Diagnoses    Urgency of urination    -  Primary    Relevant Orders    POCT Urinalysis Dipstick (Automated) (Completed)    CULTURE, URINE COMPREHENSIVE (Completed)    Painful urination        Relevant Orders    POCT Urinalysis  Dipstick (Automated) (Completed)    CULTURE, URINE COMPREHENSIVE (Completed)       I have discontinued Ms. Vivanco's nitrofurantoin (macrocrystal-monohydrate). I am also having her start on ciprofloxacin, butalbital-acetaminophen-caffeine, phenazopyridine, and metoprolol succinate. Additionally, I am having her maintain her dabigatran, meloxicam, ondansetron, rizatriptan, topiramate, triamcinolone, gabapentin, zolpidem, DULoxetine, simvastatin, traMADol, losartan, KRILL OIL PO, Magnesium, Vitamin D3, phenazopyridine, metoprolol succinate, ALPRAZolam, and busPIRone.  Meds ordered this encounter  Medications  . ciprofloxacin (CIPRO) 500 MG tablet    Sig: Take 1 tablet (500 mg total) by mouth 2 (two) times daily.    Dispense:  28 tablet    Refill:  0  . busPIRone (BUSPAR) 7.5 MG tablet    Sig: Take 1 tablet (7.5 mg total) by mouth 3 (three) times daily.    Dispense:  90 tablet    Refill:  2  . butalbital-acetaminophen-caffeine (FIORICET, ESGIC) 50-325-40 MG tablet    Sig: Take 1 tablet by mouth 2 (two) times daily as needed for headache.    Dispense:  30 tablet    Refill:  1  . phenazopyridine (PYRIDIUM) 200 MG tablet    Sig: Take 1 tablet (200 mg total) by mouth 3 (three) times daily as needed for pain.    Dispense:  6 tablet    Refill:  1  . metoprolol succinate (TOPROL-XL) 50 MG 24 hr tablet    Sig: Take with the 100 mg daily to equal to 150 mg dose.    Dispense:  90 tablet    Refill:  3     Penni Homans, MD

## 2016-01-30 NOTE — Patient Instructions (Signed)
Hyperthyroidism Hyperthyroidism is when the thyroid is too active (overactive). Your thyroid is a large gland that is located in your neck. The thyroid helps to control how your body uses food (metabolism). When your thyroid is overactive, it produces too much of a hormone called thyroxine.  CAUSES Causes of hyperthyroidism may include:  Graves disease. This is when your immune system attacks the thyroid gland. This is the most common cause.  Inflammation of the thyroid gland.  Tumor in the thyroid gland or somewhere else.  Excessive use of thyroid medicines, including:  Prescription thyroid supplement.  Herbal supplements that mimic thyroid hormones.  Solid or fluid-filled lumps within your thyroid gland (thyroid nodules).  Excessive ingestion of iodine. RISK FACTORS  Being female.  Having a family history of thyroid conditions. SIGNS AND SYMPTOMS Signs and symptoms of hyperthyroidism may include:  Nervousness.  Inability to tolerate heat.  Unexplained weight loss.  Diarrhea.  Change in the texture of hair or skin.  Heart skipping beats or making extra beats.  Rapid heart rate.  Loss of menstruation.  Shaky hands.  Fatigue.  Restlessness.  Increased appetite.  Sleep problems.  Enlarged thyroid gland or nodules. DIAGNOSIS  Diagnosis of hyperthyroidism may include:  Medical history and physical exam.  Blood tests.  Ultrasound tests. TREATMENT Treatment may include:  Medicines to control your thyroid.  Surgery to remove your thyroid.  Radiation therapy. HOME CARE INSTRUCTIONS   Take medicines only as directed by your health care provider.  Do not use any tobacco products, including cigarettes, chewing tobacco, or electronic cigarettes. If you need help quitting, ask your health care provider.  Do not exercise or do physical activity until your health care provider approves.  Keep all follow-up appointments as directed by your health care  provider. This is important. SEEK MEDICAL CARE IF:  Your symptoms do not get better with treatment.  You have fever.  You are taking thyroid replacement medicine and you:  Have depression.  Feel mentally and physically slow.  Have weight gain. SEEK IMMEDIATE MEDICAL CARE IF:   You have decreased alertness or a change in your awareness.  You have abdominal pain.  You feel dizzy.  You have a rapid heartbeat.  You have an irregular heartbeat.   This information is not intended to replace advice given to you by your health care provider. Make sure you discuss any questions you have with your health care provider.   Document Released: 09/16/2005 Document Revised: 10/07/2014 Document Reviewed: 02/01/2014 Elsevier Interactive Patient Education 2016 [employer]  

## 2016-01-31 ENCOUNTER — Other Ambulatory Visit: Payer: Self-pay | Admitting: Family Medicine

## 2016-02-02 LAB — CULTURE, URINE COMPREHENSIVE: Colony Count: 50000

## 2016-02-05 ENCOUNTER — Ambulatory Visit (INDEPENDENT_AMBULATORY_CARE_PROVIDER_SITE_OTHER): Payer: Managed Care, Other (non HMO) | Admitting: Psychology

## 2016-02-05 DIAGNOSIS — F4323 Adjustment disorder with mixed anxiety and depressed mood: Secondary | ICD-10-CM

## 2016-02-06 ENCOUNTER — Encounter: Payer: Self-pay | Admitting: Family Medicine

## 2016-02-11 ENCOUNTER — Encounter: Payer: Self-pay | Admitting: Family Medicine

## 2016-02-11 DIAGNOSIS — N390 Urinary tract infection, site not specified: Secondary | ICD-10-CM

## 2016-02-11 HISTORY — DX: Urinary tract infection, site not specified: N39.0

## 2016-02-11 NOTE — Assessment & Plan Note (Signed)
EColi Uti started on antibiotics and plenty of fluids and she will call if symptoms do not resolve

## 2016-02-11 NOTE — Assessment & Plan Note (Signed)
Denies CP/palp/SOB/HA/congestion/fevers/GI or GU c/o. Taking meds as prescribed 

## 2016-02-13 ENCOUNTER — Encounter: Payer: Self-pay | Admitting: Family Medicine

## 2016-02-13 ENCOUNTER — Encounter: Payer: Self-pay | Admitting: Endocrinology

## 2016-02-13 ENCOUNTER — Ambulatory Visit (INDEPENDENT_AMBULATORY_CARE_PROVIDER_SITE_OTHER): Payer: Managed Care, Other (non HMO) | Admitting: Endocrinology

## 2016-02-13 VITALS — BP 126/80 | HR 83 | Temp 98.5°F | Ht 68.0 in | Wt 249.0 lb

## 2016-02-13 DIAGNOSIS — E0590 Thyrotoxicosis, unspecified without thyrotoxic crisis or storm: Secondary | ICD-10-CM | POA: Diagnosis not present

## 2016-02-13 LAB — TSH: TSH: 0 u[IU]/mL — ABNORMAL LOW (ref 0.35–4.50)

## 2016-02-13 LAB — T4, FREE: Free T4: 1.66 ng/dL — ABNORMAL HIGH (ref 0.60–1.60)

## 2016-02-13 NOTE — Progress Notes (Signed)
Subjective:    Patient ID: Alexandra Patterson, female    DOB: 01-17-1956, 60 y.o.   MRN: [id number]  HPI Pt reports few mos of moderate tremor of the hands, and assoc leg edema.  she has no previous h/o thyroid problems.  she has never had XRT to the anterior neck, or thyroid surgery.  she has never had thyroid imaging.  she does not consume kelp or any other prescribed or non-prescribed thyroid medication.  she has never been on amiodarone.   Past Medical History  Diagnosis Date  . Internal hemorrhoids   . Hyperplastic colon polyp   . Atrial fibrillation ([place])   . Migraine   . Anxiety   . Depression   . Frequent headaches   . Hx of blood clots     dvt  . UTI (lower urinary tract infection)   . Fainting     once to due heart out of rythm  . Fibromyalgia   . Lesion of lung     xray in 2014 thought to be benign seen at [facility] pulmonology  . Allergy   . GERD (gastroesophageal reflux disease)   . Hyperlipidemia   . Hypertension   . Obesity   . History of chicken pox   . Measles     h/o  . S/P AVR (aortic valve replacement) 03/19/2015  . Cough 08/13/2015  . Insomnia 08/13/2015  . Cervical cancer screening 09/07/2015    Menarche at 11 Irregular and heavy and painful flow secondary to fibroids No history of abnormal pap in past, last pap roughly 2003 G2P2, s/p 2 SVD history of abnormal MGM, 1 abnl bx right breast benign, normal otherwise Noconcerns today TAH b/l SPO for fibroids and migrainesand breast bx on right gyn surgeries  . UTI (urinary tract infection) 02/11/2016    Past Surgical History  Procedure Laterality Date  . Tonsillectomy    . Tubal ligation    . Dilation and curettage of uterus      x 3  . Shoulder surgery Left     arthroscopy for spurs  . Atrial ablation surgery    . Loop heart    . Abdominal hysterectomy      TAH SPO    Social History   Social History  . Marital Status: Married    Spouse Name: N/A  . Number of Children: 2  . Years of Education:  N/A   Occupational History  . Minister    Social History Main Topics  . Smoking status: Never Smoker   . Smokeless tobacco: Never Used  . Alcohol Use: No  . Drug Use: No  . Sexual Activity: Yes     Comment: lives with husband and adult son, no major dietary restrictions, full diability   Other Topics Concern  . Not on file   Social History Narrative    Current Outpatient Prescriptions on File Prior to Visit  Medication Sig Dispense Refill  . ALPRAZolam (XANAX) 0.25 MG tablet Take 1 tablet (0.25 mg total) by mouth 2 (two) times daily as needed for anxiety. 20 tablet 0  . busPIRone (BUSPAR) 7.5 MG tablet Take 1 tablet (7.5 mg total) by mouth 3 (three) times daily. 90 tablet 2  . butalbital-acetaminophen-caffeine (FIORICET, ESGIC) 50-325-40 MG tablet Take 1 tablet by mouth 2 (two) times daily as needed for headache. 30 tablet 1  . ciprofloxacin (CIPRO) 500 MG tablet Take 1 tablet (500 mg total) by mouth 2 (two) times daily. 28 tablet 0  .  dabigatran (PRADAXA) 150 MG CAPS Take 150 mg by mouth 2 (two) times daily.    . DULoxetine (CYMBALTA) 60 MG capsule Take 1 capsule (60 mg total) by mouth 2 (two) times daily. 60 capsule 3  . esomeprazole (NEXIUM) 40 MG capsule TAKE 1 CAPSULE(40 MG) BY MOUTH TWICE DAILY BEFORE A MEAL 60 capsule 0  . gabapentin (NEURONTIN) 100 MG capsule Take 100 mg by mouth daily. Reported on 02/13/2016    . losartan (COZAAR) 25 MG tablet TAKE 1 TABLET(25 MG) BY MOUTH DAILY 30 tablet 3  . meloxicam (MOBIC) 15 MG tablet Take 15 mg by mouth daily.    . metoprolol succinate (TOPROL-XL) 100 MG 24 hr tablet Take 1 tablet (100 mg total) by mouth 2 (two) times daily. Take with or immediately following a meal. 60 tablet 3  . metoprolol succinate (TOPROL-XL) 50 MG 24 hr tablet Take with the 100 mg daily to equal to 150 mg dose. 90 tablet 3  . ondansetron (ZOFRAN) 4 MG tablet TAKE 1 TABLET BY MOUTH EVERY 6 TO 8 HOURS AS NEEDED 30 tablet 2  . phenazopyridine (PYRIDIUM) 100 MG  tablet Take 1 tablet (100 mg total) by mouth 3 (three) times daily as needed for pain. 30 tablet 0  . phenazopyridine (PYRIDIUM) 200 MG tablet Take 1 tablet (200 mg total) by mouth 3 (three) times daily as needed for pain. 6 tablet 1  . rizatriptan (MAXALT) 10 MG tablet Take 10 mg by mouth as needed for migraine. May repeat in 2 hours if needed    . simvastatin (ZOCOR) 20 MG tablet Take 1 tablet (20 mg total) by mouth every evening. 30 tablet 6  . topiramate (TOPAMAX) 50 MG tablet Takes 75 mg daily    . traMADol (ULTRAM) 50 MG tablet Take 50 mg by mouth 2 (two) times daily as needed.    . triamcinolone (NASACORT ALLERGY 24HR) 55 MCG/ACT AERO nasal inhaler Place 2 sprays into the nose 2 (two) times daily.    Marland Kitchen zolpidem (AMBIEN CR) 12.5 MG CR tablet Take 1 tablet (12.5 mg total) by mouth at bedtime as needed for sleep. 30 tablet 5  . Cholecalciferol (VITAMIN D3) 5000 units CAPS Take 5,000 Units by mouth daily. Reported on 02/13/2016    . KRILL OIL PO Take 1 mg by mouth daily. Reported on 02/13/2016    . Magnesium 400 MG CAPS Take 400 mg by mouth daily. Reported on 02/13/2016     No current facility-administered medications on file prior to visit.    Allergies  Allergen Reactions  . Sulfa Antibiotics Hives  . Erythromycin Rash  . Prednisone Rash    Family History  Problem Relation Age of Onset  . Colon cancer      parent  . Colon polyps Father   . Alzheimer's disease Father   . Alcohol abuse Father   . Arthritis Mother   . Neuropathy Mother   . Hyperlipidemia Mother   . Other Mother     familial mediterranean fever  . Cancer Brother     prostate cancer  . Heart disease Brother     cardiomegaly  . Other Brother     familial mediterranean fever  . Asthma Maternal Grandmother   . Congestive Heart Failure Maternal Grandmother   . Heart disease Maternal Grandmother     chf  . Stroke Maternal Grandfather   . Diabetes Maternal Grandfather   . Heart disease Maternal Grandfather      hardening of the arteries  .  Stroke Paternal Grandfather   . Atrial fibrillation Paternal Grandfather   . Alcohol abuse Paternal Grandfather   . Heart disease Paternal Grandfather     afib  . Arthritis Paternal Uncle   . Interstitial cystitis Daughter   . Arthritis Son   . Alcohol abuse Son   . Mental illness Son     depression  . Thyroid disease Mother     BP 126/80 mmHg  Pulse 83  Temp(Src) 98.5 F (36.9 C) (Oral)  Ht 5\' 8"  (1.727 m)  Wt 249 lb (112.946 kg)  BMI 37.87 kg/m2  SpO2 97%  Review of Systems denies hoarseness, visual loss, sob, diarrhea, polyuria, muscle weakness, heat intolerance, and rhinorrhea.  She has intermittent headache, diaphoresis, anxiety, palpitations, easy bruising, and weight loss.    Objective:   Physical Exam VS: see vs page GEN: no distress HEAD: head: no deformity eyes: no periorbital swelling, no proptosis external nose and ears are normal mouth: no lesion seen NECK: supple, thyroid is not enlarged CHEST WALL: no deformity LUNGS: clear to auscultation CV: reg rate and rhythm, no murmur ABD: abdomen is soft, nontender.  no hepatosplenomegaly.  not distended.  no hernia MUSCULOSKELETAL: muscle bulk and strength are grossly normal.  no obvious joint swelling.  gait is normal and steady EXTEMITIES: no deformity.  no edema PULSES: no carotid bruit NEURO:  cn 2-12 grossly intact.   readily moves all 4's.  sensation is intact to touch on all 4's.  Slight tremor of the hands SKIN:  Normal texture and temperature.  No rash or suspicious lesion is visible.  Not diaphoretic NODES:  None palpable at the neck.  PSYCH: alert, well-oriented.  Does not appear anxious nor depressed.    Lab Results  Component Value Date   TSH 0.00* 02/13/2016   I have reviewed outside records, and summarized: Pt was noted to have hyperthyroidism, and referred here.     Assessment & Plan:  Hyperthyroidism, new, prob due to Grave's Dz.  We discussed rx options.   She chooses [facility].   Patient is advised the following: Patient Instructions  blood tests are requested for you today.  We'll let you know about the results. let's check a thyroid "scan" (a special, but easy and painless type of thyroid x ray).  It works like this: you go to the x-ray department of the hospital to swallow a pill, which contains a miniscule amount of radiation.  You will not notice any symptoms from this.  You will go back to the x-ray department the next day, to lie down in front of a camera.  The results of this will be sent to me.   Based on the results, i hope to order for you a treatment pill of radioactive iodine.  Although it is a larger amount of radiation, you will again notice no symptoms from this.  The pill is gone from your body in a few days (during which you should stay away from other people), but takes several months to work.  Therefore, please return here approximately 6-8 weeks after the treatment.  This treatment has been available for many years, and the only known side-effect is an underactive thyroid.  It is possible that i would eventually prescribe for you a thyroid hormone pill, which is very inexpensive.  You don't have to worry about side-effects of this thyroid hormone pill, because it is the same molecule your thyroid makes.

## 2016-02-13 NOTE — Patient Instructions (Signed)
blood tests are requested for you today.  We'll let you know about the results. let's check a thyroid "scan" (a special, but easy and painless type of thyroid x ray).  It works like this: you go to the x-ray department of the hospital to swallow a pill, which contains a miniscule amount of radiation.  You will not notice any symptoms from this.  You will go back to the x-ray department the next day, to lie down in front of a camera.  The results of this will be sent to me.   Based on the results, i hope to order for you a treatment pill of radioactive iodine.  Although it is a larger amount of radiation, you will again notice no symptoms from this.  The pill is gone from your body in a few days (during which you should stay away from other people), but takes several months to work.  Therefore, please return here approximately 6-8 weeks after the treatment.  This treatment has been available for many years, and the only known side-effect is an underactive thyroid.  It is possible that i would eventually prescribe for you a thyroid hormone pill, which is very inexpensive.  You don't have to worry about side-effects of this thyroid hormone pill, because it is the same molecule your thyroid makes.

## 2016-02-15 ENCOUNTER — Other Ambulatory Visit: Payer: Self-pay | Admitting: Family Medicine

## 2016-02-16 NOTE — Telephone Encounter (Signed)
Faxed hardcopy to [facility] 

## 2016-02-18 ENCOUNTER — Encounter: Payer: Self-pay | Admitting: Family Medicine

## 2016-02-19 ENCOUNTER — Encounter: Payer: Self-pay | Admitting: Family Medicine

## 2016-02-27 ENCOUNTER — Other Ambulatory Visit (INDEPENDENT_AMBULATORY_CARE_PROVIDER_SITE_OTHER): Payer: Managed Care, Other (non HMO)

## 2016-02-27 DIAGNOSIS — D649 Anemia, unspecified: Secondary | ICD-10-CM

## 2016-02-27 LAB — CBC
HEMATOCRIT: 35.6 % — AB (ref 36.0–46.0)
Hemoglobin: 11.6 g/dL — ABNORMAL LOW (ref 12.0–15.0)
MCHC: 32.5 g/dL (ref 30.0–36.0)
MCV: 79.5 fl (ref 78.0–100.0)
Platelets: 231 10*3/uL (ref 150.0–400.0)
RBC: 4.48 Mil/uL (ref 3.87–5.11)
RDW: 18.2 % — ABNORMAL HIGH (ref 11.5–15.5)
WBC: 5.6 10*3/uL (ref 4.0–10.5)

## 2016-02-27 NOTE — Addendum Note (Signed)
Addended by: Caffie Pinto on: 02/27/2016 10:48 AM   Modules accepted: Orders

## 2016-02-29 ENCOUNTER — Ambulatory Visit (HOSPITAL_COMMUNITY): Payer: Self-pay

## 2016-02-29 ENCOUNTER — Other Ambulatory Visit (HOSPITAL_COMMUNITY): Payer: Self-pay

## 2016-02-29 ENCOUNTER — Other Ambulatory Visit: Payer: Self-pay | Admitting: Family Medicine

## 2016-03-01 ENCOUNTER — Encounter (HOSPITAL_COMMUNITY): Payer: Managed Care, Other (non HMO)

## 2016-03-04 ENCOUNTER — Ambulatory Visit (INDEPENDENT_AMBULATORY_CARE_PROVIDER_SITE_OTHER): Payer: Managed Care, Other (non HMO) | Admitting: Psychology

## 2016-03-04 DIAGNOSIS — F4323 Adjustment disorder with mixed anxiety and depressed mood: Secondary | ICD-10-CM | POA: Diagnosis not present

## 2016-03-11 ENCOUNTER — Encounter (HOSPITAL_COMMUNITY)
Admission: RE | Admit: 2016-03-11 | Discharge: 2016-03-11 | Disposition: A | Discharge status: Home / Self Care | Payer: Managed Care, Other (non HMO) | Source: Ambulatory Visit | Attending: Endocrinology | Admitting: Endocrinology

## 2016-03-11 DIAGNOSIS — E0590 Thyrotoxicosis, unspecified without thyrotoxic crisis or storm: Secondary | ICD-10-CM | POA: Insufficient documentation

## 2016-03-11 MED ORDER — SODIUM IODIDE I 131 CAPSULE
7.0900 | Freq: Once | INTRAVENOUS | Status: AC | PRN
  Administered 2016-03-11: 7.09 via ORAL

## 2016-03-12 ENCOUNTER — Other Ambulatory Visit: Payer: Self-pay | Admitting: Endocrinology

## 2016-03-12 ENCOUNTER — Encounter: Payer: Self-pay | Admitting: Endocrinology

## 2016-03-12 ENCOUNTER — Encounter (HOSPITAL_COMMUNITY)
Admission: RE | Admit: 2016-03-12 | Discharge: 2016-03-12 | Disposition: A | Discharge status: Home / Self Care | Payer: Managed Care, Other (non HMO) | Source: Ambulatory Visit | Attending: Endocrinology | Admitting: Endocrinology

## 2016-03-12 DIAGNOSIS — E0590 Thyrotoxicosis, unspecified without thyrotoxic crisis or storm: Secondary | ICD-10-CM

## 2016-03-17 ENCOUNTER — Encounter: Payer: Self-pay | Admitting: Family Medicine

## 2016-03-19 ENCOUNTER — Other Ambulatory Visit: Payer: Self-pay | Admitting: Family Medicine

## 2016-03-19 MED ORDER — ZOLPIDEM TARTRATE ER 12.5 MG PO TBCR: 12.5000 mg | EXTENDED_RELEASE_TABLET | Freq: Every evening | ORAL | Status: DC | PRN

## 2016-03-21 ENCOUNTER — Ambulatory Visit (INDEPENDENT_AMBULATORY_CARE_PROVIDER_SITE_OTHER): Payer: Managed Care, Other (non HMO) | Admitting: Family Medicine

## 2016-03-21 ENCOUNTER — Encounter: Payer: Self-pay | Admitting: Family Medicine

## 2016-03-21 VITALS — BP 122/90 | HR 64 | Temp 98.3°F | Ht 68.0 in | Wt 250.2 lb

## 2016-03-21 DIAGNOSIS — E0590 Thyrotoxicosis, unspecified without thyrotoxic crisis or storm: Secondary | ICD-10-CM

## 2016-03-21 DIAGNOSIS — N3000 Acute cystitis without hematuria: Secondary | ICD-10-CM

## 2016-03-21 DIAGNOSIS — G4700 Insomnia, unspecified: Secondary | ICD-10-CM

## 2016-03-21 DIAGNOSIS — D649 Anemia, unspecified: Secondary | ICD-10-CM | POA: Diagnosis not present

## 2016-03-21 DIAGNOSIS — E669 Obesity, unspecified: Secondary | ICD-10-CM | POA: Diagnosis not present

## 2016-03-21 DIAGNOSIS — I10 Essential (primary) hypertension: Secondary | ICD-10-CM | POA: Diagnosis not present

## 2016-03-21 NOTE — Patient Instructions (Signed)
NOW probiotic 10 strain 1 cap daily At [email or web address]  Metamucil or Benefiber twice daily 64 oz  Encouraged increased hydration and fiber in diet. Daily probiotics. If bowels not moving can use MOM 2 tbls po in 4 oz of warm prune juice by mouth every 2-3 days. If no results then repeat in 4 hours with  Dulcolax suppository pr, may repeat again in 4 more hours as needed. Seek care if symptoms worsen. Consider daily Miralax and/or Dulcolax if symptoms persist.    Constipation, Adult Constipation is when a person has fewer than three bowel movements a week, has difficulty having a bowel movement, or has stools that are dry, hard, or larger than normal. As people grow older, constipation is more common. A low-fiber diet, not taking in enough fluids, and taking certain medicines may make constipation worse.  CAUSES   Certain medicines, such as antidepressants, pain medicine, iron supplements, antacids, and water pills.   Certain diseases, such as diabetes, irritable bowel syndrome (IBS), thyroid disease, or depression.   Not drinking enough water.   Not eating enough fiber-rich foods.   Stress or travel.   Lack of physical activity or exercise.   Ignoring the urge to have a bowel movement.   Using laxatives too much.  SIGNS AND SYMPTOMS   Having fewer than three bowel movements a week.   Straining to have a bowel movement.   Having stools that are hard, dry, or larger than normal.   Feeling full or bloated.   Pain in the lower abdomen.   Not feeling relief after having a bowel movement.  DIAGNOSIS  Your health care provider will take a medical history and perform a physical exam. Further testing may be done for severe constipation. Some tests may include:  A barium enema X-ray to examine your rectum, colon, and, sometimes, your small intestine.   A sigmoidoscopy to examine your lower colon.   A colonoscopy to examine your entire  colon. TREATMENT  Treatment will depend on the severity of your constipation and what is causing it. Some dietary treatments include drinking more fluids and eating more fiber-rich foods. Lifestyle treatments may include regular exercise. If these diet and lifestyle recommendations do not help, your health care provider may recommend taking over-the-counter laxative medicines to help you have bowel movements. Prescription medicines may be prescribed if over-the-counter medicines do not work.  HOME CARE INSTRUCTIONS   Eat foods that have a lot of fiber, such as fruits, vegetables, whole grains, and beans.  Limit foods high in fat and processed sugars, such as french fries, hamburgers, cookies, candies, and soda.   A fiber supplement may be added to your diet if you cannot get enough fiber from foods.   Drink enough fluids to keep your urine clear or pale yellow.   Exercise regularly or as directed by your health care provider.   Go to the restroom when you have the urge to go. Do not hold it.   Only take over-the-counter or prescription medicines as directed by your health care provider. Do not take other medicines for constipation without talking to your health care provider first.  [place] IF:   You have bright red blood in your stool.   Your constipation lasts for more than 4 days or gets worse.   You have abdominal or rectal pain.   You have thin, pencil-like stools.   You have unexplained weight loss. MAKE SURE YOU:   Understand these instructions.  Will watch your condition.  Will get help right away if you are not doing well or get worse.   This information is not intended to replace advice given to you by your health care provider. Make sure you discuss any questions you have with your health care provider.   Document Released: 06/14/2004 Document Revised: 10/07/2014 Document Reviewed: 06/28/2013 Elsevier Interactive Patient Education [employer]

## 2016-03-21 NOTE — Progress Notes (Signed)
Pre visit review using our clinic review tool, if applicable. No additional management support is needed unless otherwise documented below in the visit note. 

## 2016-03-25 ENCOUNTER — Encounter: Payer: Self-pay | Admitting: Family Medicine

## 2016-03-25 ENCOUNTER — Ambulatory Visit (INDEPENDENT_AMBULATORY_CARE_PROVIDER_SITE_OTHER): Payer: Managed Care, Other (non HMO) | Admitting: Psychology

## 2016-03-25 DIAGNOSIS — F4323 Adjustment disorder with mixed anxiety and depressed mood: Secondary | ICD-10-CM | POA: Diagnosis not present

## 2016-03-26 ENCOUNTER — Encounter: Payer: Self-pay | Admitting: Family Medicine

## 2016-03-28 ENCOUNTER — Other Ambulatory Visit: Payer: Self-pay | Admitting: Family Medicine

## 2016-03-31 NOTE — Assessment & Plan Note (Signed)
Well controlled, no changes to meds. Encouraged heart healthy diet such as the DASH diet and exercise as tolerated.  °

## 2016-03-31 NOTE — Assessment & Plan Note (Signed)
Increase leafy greens, consider increased lean red meat and using cast iron cookware. Continue to monitor, report any concerns 

## 2016-03-31 NOTE — Assessment & Plan Note (Signed)
Encouraged good sleep hygiene such as dark, quiet room. No blue/green glowing lights such as computer screens in bedroom. No alcohol or stimulants in evening. Cut down on caffeine as able. Regular exercise is helpful but not just prior to bed time. ambien prn

## 2016-03-31 NOTE — Assessment & Plan Note (Signed)
>>  ASSESSMENT AND PLAN FOR CLASS 2 SEVERE OBESITY WITH SERIOUS COMORBIDITY AND BODY MASS INDEX (BMI) OF 36.0 TO 36.9 IN ADULT, UNSPECIFIED OBESITY TYPE (HCC) WRITTEN ON 03/31/2016  4:06 PM BY BLYTH, STACEY A, MD  Encouraged DASH diet, decrease po intake and increase exercise as tolerated. Needs 7-8 hours of sleep nightly. Avoid trans fats, eat small, frequent meals every 4-5 hours with lean proteins, complex carbs and healthy fats. Minimize simple carbs, GMO foods.

## 2016-03-31 NOTE — Assessment & Plan Note (Signed)
Encouraged DASH diet, decrease po intake and increase exercise as tolerated. Needs 7-8 hours of sleep nightly. Avoid trans fats, eat small, frequent meals every 4-5 hours with lean proteins, complex carbs and healthy fats. Minimize simple carbs, GMO foods. 

## 2016-03-31 NOTE — Assessment & Plan Note (Signed)
Underwent radioactive iodine treatment last week and tolerated it well

## 2016-03-31 NOTE — Assessment & Plan Note (Signed)
Asymptomatic, encouraged adequate hydration, probiotics

## 2016-03-31 NOTE — Progress Notes (Signed)
Patient ID: Alexandra Patterson, female   DOB: 1955/10/28, 60 y.o.   MRN: [id number]   Subjective:    Patient ID: Alexandra Patterson, female    DOB: 1956-08-06, 60 y.o.   MRN: [id number]  Chief Complaint  Patient presents with  . Follow-up    HPI Patient is in today for follow up. Is recovering from a urinary tract infection. Denies any dysuria or hematuria. Does note some urinary frequency. Also notes hard stool and having to strain every few days. No bloody or tarry stool. Is struggling with poor sleep and myalgias. Denies CP/palp/SOB/HA/congestion/fevers or GU c/o. Taking meds as prescribed  Past Medical History  Diagnosis Date  . Internal hemorrhoids   . Hyperplastic colon polyp   . Atrial fibrillation ([place])   . Migraine   . Anxiety   . Depression   . Frequent headaches   . Hx of blood clots     dvt  . UTI (lower urinary tract infection)   . Fainting     once to due heart out of rythm  . Fibromyalgia   . Lesion of lung     xray in 2014 thought to be benign seen at [facility] pulmonology  . Allergy   . GERD (gastroesophageal reflux disease)   . Hyperlipidemia   . Hypertension   . Obesity   . History of chicken pox   . Measles     h/o  . S/P AVR (aortic valve replacement) 03/19/2015  . Cough 08/13/2015  . Insomnia 08/13/2015  . Cervical cancer screening 09/07/2015    Menarche at 11 Irregular and heavy and painful flow secondary to fibroids No history of abnormal pap in past, last pap roughly 2003 G2P2, s/p 2 SVD history of abnormal MGM, 1 abnl bx right breast benign, normal otherwise Noconcerns today TAH b/l SPO for fibroids and migrainesand breast bx on right gyn surgeries  . UTI (urinary tract infection) 02/11/2016    Past Surgical History  Procedure Laterality Date  . Tonsillectomy    . Tubal ligation    . Dilation and curettage of uterus      x 3  . Shoulder surgery Left     arthroscopy for spurs  . Atrial ablation surgery    . Loop heart    . Abdominal hysterectomy     TAH SPO    Family History  Problem Relation Age of Onset  . Colon cancer      parent  . Colon polyps Father   . Alzheimer's disease Father   . Alcohol abuse Father   . Arthritis Mother   . Neuropathy Mother   . Hyperlipidemia Mother   . Other Mother     familial mediterranean fever  . Cancer Brother     prostate cancer  . Heart disease Brother     cardiomegaly  . Other Brother     familial mediterranean fever  . Asthma Maternal Grandmother   . Congestive Heart Failure Maternal Grandmother   . Heart disease Maternal Grandmother     chf  . Stroke Maternal Grandfather   . Diabetes Maternal Grandfather   . Heart disease Maternal Grandfather     hardening of the arteries  . Stroke Paternal Grandfather   . Atrial fibrillation Paternal Grandfather   . Alcohol abuse Paternal Grandfather   . Heart disease Paternal Grandfather     afib  . Arthritis Paternal Uncle   . Interstitial cystitis Daughter   . Arthritis Son   . Alcohol abuse  Son   . Mental illness Son     depression  . Thyroid disease Mother     Social History   Social History  . Marital Status: Married    Spouse Name: N/A  . Number of Children: 2  . Years of Education: N/A   Occupational History  . Minister    Social History Main Topics  . Smoking status: Never Smoker   . Smokeless tobacco: Never Used  . Alcohol Use: No  . Drug Use: No  . Sexual Activity: Yes     Comment: lives with husband and adult son, no major dietary restrictions, full diability   Other Topics Concern  . Not on file   Social History Narrative    Outpatient Prescriptions Prior to Visit  Medication Sig Dispense Refill  . ALPRAZolam (XANAX) 0.25 MG tablet Take 1 tablet (0.25 mg total) by mouth 2 (two) times daily as needed for anxiety. 20 tablet 0  . busPIRone (BUSPAR) 7.5 MG tablet Take 1 tablet (7.5 mg total) by mouth 3 (three) times daily. 90 tablet 2  . butalbital-acetaminophen-caffeine (FIORICET, ESGIC) 50-325-40 MG tablet  Take 1 tablet by mouth 2 (two) times daily as needed for headache. 30 tablet 1  . Cholecalciferol (VITAMIN D3) 5000 units CAPS Take 5,000 Units by mouth daily. Reported on 02/13/2016    . dabigatran (PRADAXA) 150 MG CAPS Take 150 mg by mouth 2 (two) times daily.    Marland Kitchen gabapentin (NEURONTIN) 100 MG capsule Take 100 mg by mouth daily. Reported on 02/13/2016    . KRILL OIL PO Take 1 mg by mouth daily. Reported on 02/13/2016    . Magnesium 400 MG CAPS Take 400 mg by mouth daily. Reported on 02/13/2016    . meloxicam (MOBIC) 15 MG tablet Take 15 mg by mouth daily.    . metoprolol succinate (TOPROL-XL) 100 MG 24 hr tablet Take 1 tablet (100 mg total) by mouth 2 (two) times daily. Take with or immediately following a meal. 60 tablet 3  . metoprolol succinate (TOPROL-XL) 50 MG 24 hr tablet Take with the 100 mg daily to equal to 150 mg dose. 90 tablet 3  . ondansetron (ZOFRAN) 4 MG tablet TAKE 1 TABLET BY MOUTH EVERY 6 TO 8 HOURS AS NEEDED 30 tablet 2  . phenazopyridine (PYRIDIUM) 100 MG tablet Take 1 tablet (100 mg total) by mouth 3 (three) times daily as needed for pain. 30 tablet 0  . phenazopyridine (PYRIDIUM) 200 MG tablet Take 1 tablet (200 mg total) by mouth 3 (three) times daily as needed for pain. 6 tablet 1  . rizatriptan (MAXALT) 10 MG tablet Take 10 mg by mouth as needed for migraine. May repeat in 2 hours if needed    . simvastatin (ZOCOR) 20 MG tablet Take 1 tablet (20 mg total) by mouth every evening. 30 tablet 6  . topiramate (TOPAMAX) 50 MG tablet Takes 75 mg daily    . traMADol (ULTRAM) 50 MG tablet Take 50 mg by mouth 2 (two) times daily as needed.    . triamcinolone (NASACORT ALLERGY 24HR) 55 MCG/ACT AERO nasal inhaler Place 2 sprays into the nose 2 (two) times daily.    Marland Kitchen zolpidem (AMBIEN CR) 12.5 MG CR tablet Take 1 tablet (12.5 mg total) by mouth at bedtime as needed. for sleep 30 tablet 0  . ciprofloxacin (CIPRO) 500 MG tablet Take 1 tablet (500 mg total) by mouth 2 (two) times daily. 28  tablet 0  . DULoxetine (CYMBALTA) 60 MG  capsule TAKE 1 CAPSULE(60 MG) BY MOUTH TWICE DAILY 60 capsule 0  . esomeprazole (NEXIUM) 40 MG capsule TAKE 1 CAPSULE(40 MG) BY MOUTH TWICE DAILY BEFORE A MEAL 60 capsule 0  . losartan (COZAAR) 25 MG tablet TAKE 1 TABLET(25 MG) BY MOUTH DAILY 30 tablet 3   No facility-administered medications prior to visit.    Allergies  Allergen Reactions  . Sulfa Antibiotics Hives  . Erythromycin Rash  . Prednisone Rash    Review of Systems  Constitutional: Negative for fever and malaise/fatigue.  HENT: Negative for congestion.   Eyes: Negative for blurred vision.  Respiratory: Negative for shortness of breath.   Cardiovascular: Negative for chest pain, palpitations and leg swelling.  Gastrointestinal: Positive for constipation. Negative for nausea, abdominal pain and blood in stool.  Genitourinary: Positive for frequency. Negative for dysuria.  Musculoskeletal: Positive for myalgias. Negative for falls.  Skin: Negative for rash.  Neurological: Negative for dizziness, loss of consciousness and headaches.  Endo/Heme/Allergies: Negative for environmental allergies.  Psychiatric/Behavioral: Negative for depression. The patient has insomnia. The patient is not nervous/anxious.        Objective:    Physical Exam  Constitutional: She is oriented to person, place, and time. She appears well-developed and well-nourished. No distress.  HENT:  Head: Normocephalic and atraumatic.  Nose: Nose normal.  Eyes: Right eye exhibits no discharge. Left eye exhibits no discharge.  Neck: Normal range of motion. Neck supple.  Cardiovascular: Normal rate and regular rhythm.   No murmur heard. Pulmonary/Chest: Effort normal and breath sounds normal.  Abdominal: Soft. Bowel sounds are normal. There is no tenderness.  Musculoskeletal: She exhibits no edema.  Neurological: She is alert and oriented to person, place, and time.  Skin: Skin is warm and dry.  Psychiatric: She  has a normal mood and affect.  Nursing note and vitals reviewed.   BP 122/90 mmHg  Pulse 64  Temp(Src) 98.3 F (36.8 C) (Oral)  Ht 5\' 8"  (1.727 m)  Wt 250 lb 4 oz (113.513 kg)  BMI 38.06 kg/m2  SpO2 98% Wt Readings from Last 3 Encounters:  03/21/16 250 lb 4 oz (113.513 kg)  02/13/16 249 lb (112.946 kg)  01/30/16 251 lb 6 oz (114.023 kg)     Lab Results  Component Value Date   WBC 5.6 02/27/2016   HGB 11.6* 02/27/2016   HCT 35.6* 02/27/2016   PLT 231.0 02/27/2016   GLUCOSE 91 01/08/2016   CHOL 172 01/08/2016   TRIG 100.0 01/08/2016   HDL 55.10 01/08/2016   LDLCALC 96 01/08/2016   ALT 11 01/08/2016   AST 13 01/08/2016   NA 142 01/08/2016   K 3.9 01/08/2016   CL 108 01/08/2016   CREATININE 0.78 01/08/2016   BUN 23 01/08/2016   CO2 26 01/08/2016   TSH 0.00* 02/13/2016    Lab Results  Component Value Date   TSH 0.00* 02/13/2016   Lab Results  Component Value Date   WBC 5.6 02/27/2016   HGB 11.6* 02/27/2016   HCT 35.6* 02/27/2016   MCV 79.5 02/27/2016   PLT 231.0 02/27/2016   Lab Results  Component Value Date   NA 142 01/08/2016   K 3.9 01/08/2016   CO2 26 01/08/2016   GLUCOSE 91 01/08/2016   BUN 23 01/08/2016   CREATININE 0.78 01/08/2016   BILITOT 0.5 01/08/2016   ALKPHOS 82 01/08/2016   AST 13 01/08/2016   ALT 11 01/08/2016   PROT 6.4 01/08/2016   ALBUMIN 3.8 01/08/2016   CALCIUM  9.4 01/08/2016   GFR 79.88 01/08/2016   Lab Results  Component Value Date   CHOL 172 01/08/2016   Lab Results  Component Value Date   HDL 55.10 01/08/2016   Lab Results  Component Value Date   LDLCALC 96 01/08/2016   Lab Results  Component Value Date   TRIG 100.0 01/08/2016   Lab Results  Component Value Date   CHOLHDL 3 01/08/2016   No results found for: HGBA1C     Assessment & Plan:   Problem List Items Addressed This Visit    Hypertension    Well controlled, no changes to meds. Encouraged heart healthy diet such as the DASH diet and exercise as  tolerated.       Obesity    Encouraged DASH diet, decrease po intake and increase exercise as tolerated. Needs 7-8 hours of sleep nightly. Avoid trans fats, eat small, frequent meals every 4-5 hours with lean proteins, complex carbs and healthy fats. Minimize simple carbs, GMO foods.      Anemia - Primary    Increase leafy greens, consider increased lean red meat and using cast iron cookware. Continue to monitor, report any concerns      Insomnia    Encouraged good sleep hygiene such as dark, quiet room. No blue/green glowing lights such as computer screens in bedroom. No alcohol or stimulants in evening. Cut down on caffeine as able. Regular exercise is helpful but not just prior to bed time. ambien prn      UTI (urinary tract infection)    Asymptomatic, encouraged adequate hydration, probiotics       Hyperthyroidism    Underwent radioactive iodine treatment last week and tolerated it well         I have discontinued Ms. Dittmer's ciprofloxacin. I am also having her maintain her dabigatran, meloxicam, ondansetron, rizatriptan, topiramate, triamcinolone, gabapentin, simvastatin, traMADol, KRILL OIL PO, Magnesium, Vitamin D3, phenazopyridine, metoprolol succinate, ALPRAZolam, busPIRone, butalbital-acetaminophen-caffeine, phenazopyridine, metoprolol succinate, and zolpidem.  No orders of the defined types were placed in this encounter.     Penni Homans, MD

## 2016-04-03 ENCOUNTER — Ambulatory Visit (INDEPENDENT_AMBULATORY_CARE_PROVIDER_SITE_OTHER): Payer: Managed Care, Other (non HMO) | Admitting: Endocrinology

## 2016-04-03 ENCOUNTER — Encounter: Payer: Self-pay | Admitting: Endocrinology

## 2016-04-03 VITALS — BP 140/100 | HR 76 | Temp 98.1°F | Ht 68.0 in | Wt 250.1 lb

## 2016-04-03 DIAGNOSIS — E0590 Thyrotoxicosis, unspecified without thyrotoxic crisis or storm: Secondary | ICD-10-CM | POA: Diagnosis not present

## 2016-04-03 LAB — TSH

## 2016-04-03 LAB — T4, FREE: Free T4: 0.14 ng/dL — ABNORMAL LOW (ref 0.60–1.60)

## 2016-04-03 MED ORDER — LEVOTHYROXINE SODIUM 125 MCG PO TABS: 125.0000 ug | ORAL_TABLET | Freq: Every day | ORAL | Status: DC

## 2016-04-03 MED ORDER — LOSARTAN POTASSIUM 100 MG PO TABS: 100.0000 mg | ORAL_TABLET | Freq: Every day | ORAL | Status: DC

## 2016-04-03 NOTE — Progress Notes (Signed)
Pre visit review using our clinic review tool, if applicable. No additional management support is needed unless otherwise documented below in the visit note. 

## 2016-04-03 NOTE — Patient Instructions (Addendum)
blood tests are requested for you today.  We'll let you know about the results. It is expected that your thyroid level has gone low.  If so, I'll prescribe for you a thyroid hormone pill. Please increase the losartan to 100 mg daily.  I have sent a prescription to your pharmacy.   Please check your blood pressure at home.  If it is still high next week, cal Dr [name].   Please come back for a follow-up appointment in 2 months.

## 2016-04-03 NOTE — Progress Notes (Signed)
Subjective:    Patient ID: Alexandra Patterson, female    DOB: Aug 22, 1956, 60 y.o.   MRN: [id number]  HPI Pt returns for f/u of subacute thyroiditis (dx'ed early 2017; she has no previous h/o thyroid problems; nuc med scan showed low uptake; she has never been on thyroid medication).  She has cold intolerance of the feet, and assoc fatigue. Past Medical History  Diagnosis Date  . Internal hemorrhoids   . Hyperplastic colon polyp   . Atrial fibrillation ([place])   . Migraine   . Anxiety   . Depression   . Frequent headaches   . Hx of blood clots     dvt  . UTI (lower urinary tract infection)   . Fainting     once to due heart out of rythm  . Fibromyalgia   . Lesion of lung     xray in 2014 thought to be benign seen at [facility] pulmonology  . Allergy   . GERD (gastroesophageal reflux disease)   . Hyperlipidemia   . Hypertension   . Obesity   . History of chicken pox   . Measles     h/o  . S/P AVR (aortic valve replacement) 03/19/2015  . Cough 08/13/2015  . Insomnia 08/13/2015  . Cervical cancer screening 09/07/2015    Menarche at 11 Irregular and heavy and painful flow secondary to fibroids No history of abnormal pap in past, last pap roughly 2003 G2P2, s/p 2 SVD history of abnormal MGM, 1 abnl bx right breast benign, normal otherwise Noconcerns today TAH b/l SPO for fibroids and migrainesand breast bx on right gyn surgeries  . UTI (urinary tract infection) 02/11/2016    Past Surgical History  Procedure Laterality Date  . Tonsillectomy    . Tubal ligation    . Dilation and curettage of uterus      x 3  . Shoulder surgery Left     arthroscopy for spurs  . Atrial ablation surgery    . Loop heart    . Abdominal hysterectomy      TAH SPO    Social History   Social History  . Marital Status: Married    Spouse Name: N/A  . Number of Children: 2  . Years of Education: N/A   Occupational History  . Minister    Social History Main Topics  . Smoking status: Never Smoker     . Smokeless tobacco: Never Used  . Alcohol Use: No  . Drug Use: No  . Sexual Activity: Yes     Comment: lives with husband and adult son, no major dietary restrictions, full diability   Other Topics Concern  . Not on file   Social History Narrative    Current Outpatient Prescriptions on File Prior to Visit  Medication Sig Dispense Refill  . ALPRAZolam (XANAX) 0.25 MG tablet Take 1 tablet (0.25 mg total) by mouth 2 (two) times daily as needed for anxiety. 20 tablet 0  . busPIRone (BUSPAR) 7.5 MG tablet Take 1 tablet (7.5 mg total) by mouth 3 (three) times daily. 90 tablet 2  . butalbital-acetaminophen-caffeine (FIORICET, ESGIC) 50-325-40 MG tablet Take 1 tablet by mouth 2 (two) times daily as needed for headache. 30 tablet 1  . Cholecalciferol (VITAMIN D3) 5000 units CAPS Take 5,000 Units by mouth daily. Reported on 02/13/2016    . DULoxetine (CYMBALTA) 60 MG capsule TAKE 1 CAPSULE(60 MG) BY MOUTH TWICE DAILY 60 capsule 5  . esomeprazole (NEXIUM) 40 MG capsule TAKE 1 CAPSULE(40  MG) BY MOUTH TWICE DAILY BEFORE A MEAL 60 capsule 5  . KRILL OIL PO Take 1 mg by mouth daily. Reported on 02/13/2016    . Magnesium 400 MG CAPS Take 400 mg by mouth daily. Reported on 02/13/2016    . meloxicam (MOBIC) 15 MG tablet Take 15 mg by mouth daily.    . metoprolol succinate (TOPROL-XL) 100 MG 24 hr tablet Take 1 tablet (100 mg total) by mouth 2 (two) times daily. Take with or immediately following a meal. 60 tablet 3  . metoprolol succinate (TOPROL-XL) 50 MG 24 hr tablet Take with the 100 mg daily to equal to 150 mg dose. 90 tablet 3  . ondansetron (ZOFRAN) 4 MG tablet TAKE 1 TABLET BY MOUTH EVERY 6 TO 8 HOURS AS NEEDED 30 tablet 2  . rizatriptan (MAXALT) 10 MG tablet Take 10 mg by mouth as needed for migraine. May repeat in 2 hours if needed    . simvastatin (ZOCOR) 20 MG tablet Take 1 tablet (20 mg total) by mouth every evening. 30 tablet 6  . topiramate (TOPAMAX) 50 MG tablet Takes 75 mg daily    .  traMADol (ULTRAM) 50 MG tablet Take 50 mg by mouth 2 (two) times daily as needed.    . triamcinolone (NASACORT ALLERGY 24HR) 55 MCG/ACT AERO nasal inhaler Place 2 sprays into the nose 2 (two) times daily.    Marland Kitchen zolpidem (AMBIEN CR) 12.5 MG CR tablet Take 1 tablet (12.5 mg total) by mouth at bedtime as needed. for sleep 30 tablet 0   No current facility-administered medications on file prior to visit.    Allergies  Allergen Reactions  . Sulfa Antibiotics Hives  . Erythromycin Rash  . Prednisone Rash    Family History  Problem Relation Age of Onset  . Colon cancer      parent  . Colon polyps Father   . Alzheimer's disease Father   . Alcohol abuse Father   . Arthritis Mother   . Neuropathy Mother   . Hyperlipidemia Mother   . Other Mother     familial mediterranean fever  . Cancer Brother     prostate cancer  . Heart disease Brother     cardiomegaly  . Other Brother     familial mediterranean fever  . Asthma Maternal Grandmother   . Congestive Heart Failure Maternal Grandmother   . Heart disease Maternal Grandmother     chf  . Stroke Maternal Grandfather   . Diabetes Maternal Grandfather   . Heart disease Maternal Grandfather     hardening of the arteries  . Stroke Paternal Grandfather   . Atrial fibrillation Paternal Grandfather   . Alcohol abuse Paternal Grandfather   . Heart disease Paternal Grandfather     afib  . Arthritis Paternal Uncle   . Interstitial cystitis Daughter   . Arthritis Son   . Alcohol abuse Son   . Mental illness Son     depression  . Thyroid disease Mother     BP 140/100 mmHg  Pulse 76  Temp(Src) 98.1 F (36.7 C) (Oral)  Ht 5\' 8"  (1.727 m)  Wt 250 lb 2 oz (113.456 kg)  BMI 38.04 kg/m2  SpO2 97%  Review of Systems She has constipation and depression.      Objective:   Physical Exam VITAL SIGNS:  See vs page GENERAL: no distress NECK: There is no palpable thyroid enlargement.  No thyroid nodule is palpable.  No palpable  lymphadenopathy at the anterior  neck.   Lab Results  Component Value Date   TSH 103.00 Repeated and verified X2.* 04/03/2016      Assessment & Plan:  Subacute thyroiditis, in hypothyroid phase.  I have sent a prescription to your pharmacy, for synthroid.    Patient is advised the following: Patient Instructions  blood tests are requested for you today.  We'll let you know about the results. It is expected that your thyroid level has gone low.  If so, I'll prescribe for you a thyroid hormone pill. Please increase the losartan to 100 mg daily.  I have sent a prescription to your pharmacy.   Please check your blood pressure at home.  If it is still high next week, cal Dr [name].   Please come back for a follow-up appointment in 2 months.    Renato Shin, MD

## 2016-04-15 ENCOUNTER — Ambulatory Visit (INDEPENDENT_AMBULATORY_CARE_PROVIDER_SITE_OTHER): Payer: Managed Care, Other (non HMO) | Admitting: Psychology

## 2016-04-15 DIAGNOSIS — F4323 Adjustment disorder with mixed anxiety and depressed mood: Secondary | ICD-10-CM

## 2016-04-18 ENCOUNTER — Encounter: Payer: Self-pay | Admitting: Endocrinology

## 2016-04-18 ENCOUNTER — Encounter: Payer: Self-pay | Admitting: Family Medicine

## 2016-04-19 ENCOUNTER — Other Ambulatory Visit: Payer: Self-pay | Admitting: Family Medicine

## 2016-04-19 MED ORDER — ZOLPIDEM TARTRATE ER 12.5 MG PO TBCR: 12.5000 mg | EXTENDED_RELEASE_TABLET | Freq: Every evening | ORAL | Status: DC | PRN

## 2016-04-30 ENCOUNTER — Other Ambulatory Visit: Payer: Self-pay | Admitting: Family Medicine

## 2016-04-30 DIAGNOSIS — E0590 Thyrotoxicosis, unspecified without thyrotoxic crisis or storm: Secondary | ICD-10-CM

## 2016-05-06 ENCOUNTER — Encounter: Payer: Self-pay | Admitting: Family Medicine

## 2016-05-07 ENCOUNTER — Other Ambulatory Visit: Payer: Self-pay | Admitting: Family Medicine

## 2016-05-07 MED ORDER — TRAMADOL HCL 50 MG PO TABS: 50.0000 mg | ORAL_TABLET | Freq: Two times a day (BID) | ORAL | 0 refills | Status: DC | PRN

## 2016-05-14 ENCOUNTER — Other Ambulatory Visit: Payer: Self-pay | Admitting: Family Medicine

## 2016-05-15 ENCOUNTER — Ambulatory Visit: Payer: Managed Care, Other (non HMO) | Admitting: Psychology

## 2016-05-31 ENCOUNTER — Ambulatory Visit (INDEPENDENT_AMBULATORY_CARE_PROVIDER_SITE_OTHER): Payer: Managed Care, Other (non HMO) | Admitting: Family

## 2016-05-31 ENCOUNTER — Encounter: Payer: Self-pay | Admitting: Family

## 2016-05-31 VITALS — BP 146/91 | HR 71 | Temp 98.6°F | Resp 17 | Ht 68.0 in | Wt 246.6 lb

## 2016-05-31 DIAGNOSIS — R300 Dysuria: Secondary | ICD-10-CM

## 2016-05-31 LAB — POCT URINALYSIS DIPSTICK
BILIRUBIN UA: NEGATIVE
GLUCOSE UA: NEGATIVE
Ketones, UA: NEGATIVE
Leukocytes, UA: NEGATIVE
NITRITE UA: NEGATIVE
Protein, UA: NEGATIVE
RBC UA: NEGATIVE
Spec Grav, UA: 1.015
Urobilinogen, UA: NEGATIVE
pH, UA: 6

## 2016-05-31 MED ORDER — CIPROFLOXACIN HCL 500 MG PO TABS: 500.0000 mg | ORAL_TABLET | Freq: Two times a day (BID) | ORAL | 0 refills | Status: AC

## 2016-05-31 NOTE — Progress Notes (Signed)
Subjective:    Patient ID: Alexandra Patterson, female    DOB: 28-Feb-1956, 60 y.o.   MRN: [id number]  HPI  Alexandra Patterson is a 60 yr old female who presents today with chief complaint of urinary frequency x 2 days. Denies dysuria.  Reports some associated left lower back pain as well. She reports occasional lower abdominal pain which is "across the lower abdomen." She reports hx of recurrent UTI, previous bladder sling which was taken down "because it was too tight."  Also notes nocturia recently which is bothersome.   Review of Systems    see HPI  Past Medical History:  Diagnosis Date  . Allergy   . Anxiety   . Atrial fibrillation ([place])   . Cervical cancer screening 09/07/2015   Menarche at 11 Irregular and heavy and painful flow secondary to fibroids No history of abnormal pap in past, last pap roughly 2003 G2P2, s/p 2 SVD history of abnormal MGM, 1 abnl bx right breast benign, normal otherwise Noconcerns today TAH b/l SPO for fibroids and migrainesand breast bx on right gyn surgeries  . Cough 08/13/2015  . Depression   . Fainting    once to due heart out of rythm  . Fibromyalgia   . Frequent headaches   . GERD (gastroesophageal reflux disease)   . History of chicken pox   . Hx of blood clots    dvt  . Hyperlipidemia   . Hyperplastic colon polyp   . Hypertension   . Insomnia 08/13/2015  . Internal hemorrhoids   . Lesion of lung    xray in 2014 thought to be benign seen at [facility] pulmonology  . Measles    h/o  . Migraine   . Obesity   . S/P AVR (aortic valve replacement) 03/19/2015  . UTI (lower urinary tract infection)   . UTI (urinary tract infection) 02/11/2016     Social History   Social History  . Marital status: Married    Spouse name: N/A  . Number of children: 2  . Years of education: N/A   Occupational History  . [employer]   Social History Main Topics  . Smoking status: Never Smoker  . Smokeless tobacco: Never Used  . Alcohol use  No  . Drug use: No  . Sexual activity: Yes     Comment: lives with husband and adult son, no major dietary restrictions, full diability   Other Topics Concern  . Not on file   Social History Narrative  . No narrative on file    Past Surgical History:  Procedure Laterality Date  . ABDOMINAL HYSTERECTOMY     TAH SPO  . ATRIAL ABLATION SURGERY    . DILATION AND CURETTAGE OF UTERUS     x 3  . loop heart    . SHOULDER SURGERY Left    arthroscopy for spurs  . TONSILLECTOMY    . TUBAL LIGATION      Family History  Problem Relation Age of Onset  . Colon cancer      parent  . Colon polyps Father   . Alzheimer's disease Father   . Alcohol abuse Father   . Arthritis Mother   . Neuropathy Mother   . Hyperlipidemia Mother   . Other Mother     familial mediterranean fever  . Cancer Brother     prostate cancer  . Heart disease Brother     cardiomegaly  . Other Brother     familial  mediterranean fever  . Asthma Maternal Grandmother   . Congestive Heart Failure Maternal Grandmother   . Heart disease Maternal Grandmother     chf  . Stroke Maternal Grandfather   . Diabetes Maternal Grandfather   . Heart disease Maternal Grandfather     hardening of the arteries  . Stroke Paternal Grandfather   . Atrial fibrillation Paternal Grandfather   . Alcohol abuse Paternal Grandfather   . Heart disease Paternal Grandfather     afib  . Arthritis Paternal Uncle   . Interstitial cystitis Daughter   . Arthritis Son   . Alcohol abuse Son   . Mental illness Son     depression  . Thyroid disease Mother     Allergies  Allergen Reactions  . Sulfa Antibiotics Hives  . Erythromycin Rash  . Prednisone Rash    Current Outpatient Prescriptions on File Prior to Visit  Medication Sig Dispense Refill  . busPIRone (BUSPAR) 7.5 MG tablet Take 1 tablet (7.5 mg total) by mouth 3 (three) times daily. 90 tablet 2  . butalbital-acetaminophen-caffeine (FIORICET, ESGIC) 50-325-40 MG tablet Take 1  tablet by mouth 2 (two) times daily as needed for headache. 30 tablet 1  . Cholecalciferol (VITAMIN D3) 5000 units CAPS Take 5,000 Units by mouth daily. Reported on 02/13/2016    . DULoxetine (CYMBALTA) 60 MG capsule TAKE 1 CAPSULE(60 MG) BY MOUTH TWICE DAILY 60 capsule 5  . esomeprazole (NEXIUM) 40 MG capsule TAKE 1 CAPSULE(40 MG) BY MOUTH TWICE DAILY BEFORE A MEAL 60 capsule 5  . KRILL OIL PO Take 1 mg by mouth daily. Reported on 02/13/2016    . levothyroxine (SYNTHROID, LEVOTHROID) 125 MCG tablet Take 1 tablet (125 mcg total) by mouth daily. 30 tablet 3  . losartan (COZAAR) 100 MG tablet Take 1 tablet (100 mg total) by mouth daily. 30 tablet 3  . Magnesium 400 MG CAPS Take 400 mg by mouth daily. Reported on 02/13/2016    . meloxicam (MOBIC) 15 MG tablet Take 15 mg by mouth daily.    . metoprolol succinate (TOPROL-XL) 100 MG 24 hr tablet TAKE 1 TABLET BY MOUTH TWICE A DAY, WITH OR IMMEDIATELY FOLLOWING A MEAL 60 tablet 0  . metoprolol succinate (TOPROL-XL) 50 MG 24 hr tablet Take with the 100 mg daily to equal to 150 mg dose. 90 tablet 3  . metoprolol succinate (TOPROL-XL) 50 MG 24 hr tablet TAKE 1 TABLET BY MOUTH TWICE DAILY. TAKE WITH OR IMMEDIATELY FOLLOWING A MEAL 60 tablet 0  . ondansetron (ZOFRAN) 4 MG tablet TAKE 1 TABLET BY MOUTH EVERY 6 TO 8 HOURS AS NEEDED 30 tablet 2  . rizatriptan (MAXALT) 10 MG tablet Take 10 mg by mouth as needed for migraine. May repeat in 2 hours if needed    . simvastatin (ZOCOR) 20 MG tablet Take 1 tablet (20 mg total) by mouth every evening. 30 tablet 6  . topiramate (TOPAMAX) 50 MG tablet Takes 75 mg daily    . traMADol (ULTRAM) 50 MG tablet Take 1 tablet (50 mg total) by mouth 2 (two) times daily as needed for moderate pain. 60 tablet 0  . triamcinolone (NASACORT ALLERGY 24HR) 55 MCG/ACT AERO nasal inhaler Place 2 sprays into the nose 2 (two) times daily.    Marland Kitchen zolpidem (AMBIEN CR) 12.5 MG CR tablet Take 1 tablet (12.5 mg total) by mouth at bedtime as needed. for  sleep 30 tablet 3  . ALPRAZolam (XANAX) 0.25 MG tablet Take 1 tablet (0.25 mg total)  by mouth 2 (two) times daily as needed for anxiety. (Patient not taking: Reported on 05/31/2016) 20 tablet 0   No current facility-administered medications on file prior to visit.     BP (!) 146/91 (BP Location: Right Arm, Patient Position: Sitting, Cuff Size: Large)   Pulse 71   Temp 98.6 F (37 C) (Oral)   Resp 17   Ht 5\' 8"  (1.727 m)   Wt 246 lb 9.6 oz (111.9 kg)   SpO2 100%   BMI 37.50 kg/m    Objective:   Physical Exam  Constitutional: She is oriented to person, place, and time. She appears well-developed and well-nourished.  Cardiovascular: Normal rate, regular rhythm and normal heart sounds.   No murmur heard. Pulmonary/Chest: Effort normal and breath sounds normal. No respiratory distress. She has no wheezes.  Abdominal: Soft. Bowel sounds are normal. She exhibits no distension. There is no tenderness. There is no rebound, no guarding and no CVA tenderness.  Musculoskeletal: She exhibits no edema.  Neurological: She is alert and oriented to person, place, and time.  Psychiatric: She has a normal mood and affect. Her behavior is normal. Judgment and thought content normal.          Assessment & Plan:  Urinary frequency- sxs concerning for uti/pyelonephritis given unilateral low back pain. Will begin empiric cipro, send urine for culture. UA is unremarkable. Can d/c abx if urine culture is negative. Discussed with patient that if her urinary frequency persists, she should discuss with Dr. [name] at her upcoming follow up due to possible overactive bladder symptoms.

## 2016-05-31 NOTE — Patient Instructions (Signed)
Please begin cipro for possible Urinary tract infection. Call if new/worsening back pain, fever.  Follow up as scheduled with Dr. [name].

## 2016-05-31 NOTE — Progress Notes (Signed)
Pre visit review using our clinic review tool, if applicable. No additional management support is needed unless otherwise documented below in the visit note. 

## 2016-06-01 ENCOUNTER — Encounter: Payer: Self-pay | Admitting: Family Medicine

## 2016-06-01 ENCOUNTER — Other Ambulatory Visit: Payer: Self-pay | Admitting: Family Medicine

## 2016-06-01 DIAGNOSIS — E0590 Thyrotoxicosis, unspecified without thyrotoxic crisis or storm: Secondary | ICD-10-CM

## 2016-06-04 ENCOUNTER — Other Ambulatory Visit (INDEPENDENT_AMBULATORY_CARE_PROVIDER_SITE_OTHER): Payer: Managed Care, Other (non HMO)

## 2016-06-04 ENCOUNTER — Encounter: Payer: Self-pay | Admitting: Endocrinology

## 2016-06-04 ENCOUNTER — Other Ambulatory Visit: Payer: Self-pay | Admitting: Family Medicine

## 2016-06-04 ENCOUNTER — Ambulatory Visit (INDEPENDENT_AMBULATORY_CARE_PROVIDER_SITE_OTHER): Payer: Managed Care, Other (non HMO) | Admitting: Endocrinology

## 2016-06-04 DIAGNOSIS — E061 Subacute thyroiditis: Secondary | ICD-10-CM | POA: Insufficient documentation

## 2016-06-04 DIAGNOSIS — E0590 Thyrotoxicosis, unspecified without thyrotoxic crisis or storm: Secondary | ICD-10-CM

## 2016-06-04 LAB — TSH: TSH: 2.18 u[IU]/mL (ref 0.35–4.50)

## 2016-06-04 LAB — T4, FREE: FREE T4: 0.92 ng/dL (ref 0.60–1.60)

## 2016-06-04 MED ORDER — METOPROLOL SUCCINATE ER 100 MG PO TB24: 100.0000 mg | ORAL_TABLET | Freq: Two times a day (BID) | ORAL | 6 refills | Status: DC

## 2016-06-04 NOTE — Progress Notes (Signed)
Subjective:    Patient ID: Alexandra Patterson, female    DOB: 13-Dec-1955, 59 y.o.   MRN: [id number]  HPI Pt returns for f/u of subacute thyroiditis (dx'ed early 2017; she has no previous h/o thyroid problems; nuc med scan showed low uptake; in mid-2017, she was rx'ed synthroid due to very high TSH).  Since on synthroid, she feels much better in general.   Past Medical History:  Diagnosis Date  . Allergy   . Anxiety   . Atrial fibrillation ([place])   . Cervical cancer screening 09/07/2015   Menarche at 11 Irregular and heavy and painful flow secondary to fibroids No history of abnormal pap in past, last pap roughly 2003 G2P2, s/p 2 SVD history of abnormal MGM, 1 abnl bx right breast benign, normal otherwise Noconcerns today TAH b/l SPO for fibroids and migrainesand breast bx on right gyn surgeries  . Cough 08/13/2015  . Depression   . Fainting    once to due heart out of rythm  . Fibromyalgia   . Frequent headaches   . GERD (gastroesophageal reflux disease)   . History of chicken pox   . Hx of blood clots    dvt  . Hyperlipidemia   . Hyperplastic colon polyp   . Hypertension   . Insomnia 08/13/2015  . Internal hemorrhoids   . Lesion of lung    xray in 2014 thought to be benign seen at [facility] pulmonology  . Measles    h/o  . Migraine   . Obesity   . S/P AVR (aortic valve replacement) 03/19/2015  . UTI (lower urinary tract infection)   . UTI (urinary tract infection) 02/11/2016    Past Surgical History:  Procedure Laterality Date  . ABDOMINAL HYSTERECTOMY     TAH SPO  . ATRIAL ABLATION SURGERY    . DILATION AND CURETTAGE OF UTERUS     x 3  . loop heart    . SHOULDER SURGERY Left    arthroscopy for spurs  . TONSILLECTOMY    . TUBAL LIGATION      Social History   Social History  . Marital status: Married    Spouse name: N/A  . Number of children: 2  . Years of education: N/A   Occupational History  . [employer]   Social History Main Topics   . Smoking status: Never Smoker  . Smokeless tobacco: Never Used  . Alcohol use No  . Drug use: No  . Sexual activity: Yes     Comment: lives with husband and adult son, no major dietary restrictions, full diability   Other Topics Concern  . Not on file   Social History Narrative  . No narrative on file    Current Outpatient Prescriptions on File Prior to Visit  Medication Sig Dispense Refill  . busPIRone (BUSPAR) 7.5 MG tablet Take 1 tablet (7.5 mg total) by mouth 3 (three) times daily. 90 tablet 2  . butalbital-acetaminophen-caffeine (FIORICET, ESGIC) 50-325-40 MG tablet Take 1 tablet by mouth 2 (two) times daily as needed for headache. 30 tablet 1  . Cholecalciferol (VITAMIN D3) 5000 units CAPS Take 5,000 Units by mouth daily. Reported on 02/13/2016    . ciprofloxacin (CIPRO) 500 MG tablet Take 1 tablet (500 mg total) by mouth 2 (two) times daily. 14 tablet 0  . DULoxetine (CYMBALTA) 60 MG capsule TAKE 1 CAPSULE(60 MG) BY MOUTH TWICE DAILY 60 capsule 5  . esomeprazole (NEXIUM) 40 MG capsule TAKE 1 CAPSULE(40  MG) BY MOUTH TWICE DAILY BEFORE A MEAL 60 capsule 5  . KRILL OIL PO Take 1 mg by mouth daily. Reported on 02/13/2016    . levothyroxine (SYNTHROID, LEVOTHROID) 125 MCG tablet Take 1 tablet (125 mcg total) by mouth daily. 30 tablet 3  . losartan (COZAAR) 100 MG tablet Take 1 tablet (100 mg total) by mouth daily. 30 tablet 3  . Magnesium 400 MG CAPS Take 400 mg by mouth daily. Reported on 02/13/2016    . meloxicam (MOBIC) 15 MG tablet Take 15 mg by mouth daily.    . metoprolol succinate (TOPROL-XL) 50 MG 24 hr tablet Take with the 100 mg daily to equal to 150 mg dose. 90 tablet 3  . ondansetron (ZOFRAN) 4 MG tablet TAKE 1 TABLET BY MOUTH EVERY 6 TO 8 HOURS AS NEEDED 30 tablet 2  . rizatriptan (MAXALT) 10 MG tablet Take 10 mg by mouth as needed for migraine. May repeat in 2 hours if needed    . simvastatin (ZOCOR) 20 MG tablet Take 1 tablet (20 mg total) by mouth every evening. 30  tablet 6  . topiramate (TOPAMAX) 50 MG tablet Takes 75 mg daily    . traMADol (ULTRAM) 50 MG tablet Take 1 tablet (50 mg total) by mouth 2 (two) times daily as needed for moderate pain. 60 tablet 0  . triamcinolone (NASACORT ALLERGY 24HR) 55 MCG/ACT AERO nasal inhaler Place 2 sprays into the nose 2 (two) times daily.    Marland Kitchen zolpidem (AMBIEN CR) 12.5 MG CR tablet Take 1 tablet (12.5 mg total) by mouth at bedtime as needed. for sleep 30 tablet 3   No current facility-administered medications on file prior to visit.     Allergies  Allergen Reactions  . Sulfa Antibiotics Hives  . Erythromycin Rash  . Prednisone Rash    Family History  Problem Relation Age of Onset  . Colon cancer      parent  . Colon polyps Father   . Alzheimer's disease Father   . Alcohol abuse Father   . Arthritis Mother   . Neuropathy Mother   . Hyperlipidemia Mother   . Other Mother     familial mediterranean fever  . Cancer Brother     prostate cancer  . Heart disease Brother     cardiomegaly  . Other Brother     familial mediterranean fever  . Asthma Maternal Grandmother   . Congestive Heart Failure Maternal Grandmother   . Heart disease Maternal Grandmother     chf  . Stroke Maternal Grandfather   . Diabetes Maternal Grandfather   . Heart disease Maternal Grandfather     hardening of the arteries  . Stroke Paternal Grandfather   . Atrial fibrillation Paternal Grandfather   . Alcohol abuse Paternal Grandfather   . Heart disease Paternal Grandfather     afib  . Arthritis Paternal Uncle   . Interstitial cystitis Daughter   . Arthritis Son   . Alcohol abuse Son   . Mental illness Son     depression  . Thyroid disease Mother     BP 122/80   Pulse 64   Ht 5\' 8"  (1.727 m)   Wt 249 lb (112.9 kg)   BMI 37.86 kg/m    Review of Systems No weight change    Objective:   Physical Exam VITAL SIGNS:  See vs page GENERAL: no distress NECK: There is no palpable thyroid enlargement.  No thyroid  nodule is palpable.  No palpable  lymphadenopathy at the anterior neck.     Lab Results  Component Value Date   TSH 2.18 06/04/2016      Assessment & Plan:  Subacute thyroiditis, hypothyroid phase.  Well-replaced.  Please continue the same synthroid

## 2016-06-04 NOTE — Patient Instructions (Addendum)
blood tests are requested for you today.  We'll let you know about the results.   Please come back for a follow-up appointment in 2 months.  

## 2016-06-13 ENCOUNTER — Encounter: Payer: Self-pay | Admitting: Family Medicine

## 2016-06-14 ENCOUNTER — Other Ambulatory Visit: Payer: Self-pay | Admitting: Family Medicine

## 2016-06-17 NOTE — Telephone Encounter (Signed)
Faxed hardcopy for Tramadol to [address] [place]

## 2016-06-20 ENCOUNTER — Ambulatory Visit (INDEPENDENT_AMBULATORY_CARE_PROVIDER_SITE_OTHER): Payer: Managed Care, Other (non HMO) | Admitting: Family Medicine

## 2016-06-20 DIAGNOSIS — E785 Hyperlipidemia, unspecified: Secondary | ICD-10-CM

## 2016-06-20 DIAGNOSIS — G43809 Other migraine, not intractable, without status migrainosus: Secondary | ICD-10-CM | POA: Diagnosis not present

## 2016-06-20 DIAGNOSIS — G4700 Insomnia, unspecified: Secondary | ICD-10-CM | POA: Diagnosis not present

## 2016-06-20 DIAGNOSIS — K219 Gastro-esophageal reflux disease without esophagitis: Secondary | ICD-10-CM

## 2016-06-20 DIAGNOSIS — F418 Other specified anxiety disorders: Secondary | ICD-10-CM

## 2016-06-20 DIAGNOSIS — I10 Essential (primary) hypertension: Secondary | ICD-10-CM | POA: Diagnosis not present

## 2016-06-20 MED ORDER — ARIPIPRAZOLE 5 MG PO TABS: 5.0000 mg | ORAL_TABLET | Freq: Every day | ORAL | 2 refills | Status: DC

## 2016-06-20 NOTE — Progress Notes (Signed)
Pre visit review using our clinic review tool, if applicable. No additional management support is needed unless otherwise documented below in the visit note. 

## 2016-06-20 NOTE — Patient Instructions (Signed)
Major Depressive Disorder Major depressive disorder is a mental illness. It also may be called clinical depression or unipolar depression. Major depressive disorder usually causes feelings of sadness, hopelessness, or helplessness. Some people with this disorder do not feel particularly sad but lose interest in doing things they used to enjoy (anhedonia). Major depressive disorder also can cause physical symptoms. It can interfere with work, school, relationships, and other normal everyday activities. The disorder varies in severity but is longer lasting and more serious than the sadness we all feel from time to time in our lives. Major depressive disorder often is triggered by stressful life events or major life changes. Examples of these triggers include divorce, loss of your job or home, a move, and the death of a family member or close friend. Sometimes this disorder occurs for no obvious reason at all. People who have family members with major depressive disorder or bipolar disorder are at higher risk for developing this disorder, with or without life stressors. Major depressive disorder can occur at any age. It may occur just once in your life (single episode major depressive disorder). It may occur multiple times (recurrent major depressive disorder). SYMPTOMS People with major depressive disorder have either anhedonia or depressed mood on nearly a daily basis for at least 2 weeks or longer. Symptoms of depressed mood include:  Feelings of sadness (blue or down in the dumps) or emptiness.  Feelings of hopelessness or helplessness.  Tearfulness or episodes of crying (may be observed by others).  Irritability (children and adolescents). In addition to depressed mood or anhedonia or both, people with this disorder have at least four of the following symptoms:  Difficulty sleeping or sleeping too much.   Significant change (increase or decrease) in appetite or weight.   Lack of energy or  motivation.  Feelings of guilt and worthlessness.   Difficulty concentrating, remembering, or making decisions.  Unusually slow movement (psychomotor retardation) or restlessness (as observed by others).   Recurrent wishes for death, recurrent thoughts of self-harm (suicide), or a suicide attempt. People with major depressive disorder commonly have persistent negative thoughts about themselves, other people, and the world. People with severe major depressive disorder may experiencedistorted beliefs or perceptions about the world (psychotic delusions). They also may see or hear things that are not real (psychotic hallucinations). DIAGNOSIS Major depressive disorder is diagnosed through an assessment by your health care provider. Your health care provider will ask aboutaspects of your daily life, such as mood,sleep, and appetite, to see if you have the diagnostic symptoms of major depressive disorder. Your health care provider may ask about your medical history and use of alcohol or drugs, including prescription medicines. Your health care provider also may do a physical exam and blood work. This is because certain medical conditions and the use of certain substances can cause major depressive disorder-like symptoms (secondary depression). Your health care provider also may refer you to a mental health specialist for further evaluation and treatment. TREATMENT It is important to recognize the symptoms of major depressive disorder and seek treatment. The following treatments can be prescribed for this disorder:   Medicine. Antidepressant medicines usually are prescribed. Antidepressant medicines are thought to correct chemical imbalances in the brain that are commonly associated with major depressive disorder. Other types of medicine may be added if the symptoms do not respond to antidepressant medicines alone or if psychotic delusions or hallucinations occur.  Talk therapy. Talk therapy can be  helpful in treating major depressive disorder by providing   support, education, and guidance. Certain types of talk therapy also can help with negative thinking (cognitive behavioral therapy) and with relationship issues that trigger this disorder (interpersonal therapy). A mental health specialist can help determine which treatment is best for you. Most people with major depressive disorder do well with a combination of medicine and talk therapy. Treatments involving electrical stimulation of the brain can be used in situations with extremely severe symptoms or when medicine and talk therapy do not work over time. These treatments include electroconvulsive therapy, transcranial magnetic stimulation, and vagal nerve stimulation.   This information is not intended to replace advice given to you by your health care provider. Make sure you discuss any questions you have with your health care provider.   Document Released: 01/11/2013 Document Revised: 10/07/2014 Document Reviewed: 01/11/2013 Elsevier Interactive Patient Education 2016 Elsevier Inc.  

## 2016-06-22 ENCOUNTER — Encounter: Payer: Self-pay | Admitting: Family Medicine

## 2016-06-25 ENCOUNTER — Other Ambulatory Visit: Payer: Self-pay | Admitting: Family Medicine

## 2016-06-25 MED ORDER — OLANZAPINE 2.5 MG PO TABS: 2.5000 mg | ORAL_TABLET | Freq: Every day | ORAL | 1 refills | Status: DC

## 2016-06-26 ENCOUNTER — Encounter: Payer: Self-pay | Admitting: Family Medicine

## 2016-06-27 ENCOUNTER — Other Ambulatory Visit: Payer: Self-pay | Admitting: Family Medicine

## 2016-06-27 MED ORDER — DESVENLAFAXINE SUCCINATE ER 50 MG PO TB24: 50.0000 mg | ORAL_TABLET | Freq: Every day | ORAL | 2 refills | Status: DC

## 2016-06-29 ENCOUNTER — Encounter: Payer: Self-pay | Admitting: Family Medicine

## 2016-06-29 DIAGNOSIS — F418 Other specified anxiety disorders: Secondary | ICD-10-CM

## 2016-06-29 HISTORY — DX: Other specified anxiety disorders: F41.8

## 2016-06-29 NOTE — Assessment & Plan Note (Signed)
Encouraged heart healthy diet, increase exercise, avoid trans fats, consider a krill oil cap daily 

## 2016-06-29 NOTE — Assessment & Plan Note (Signed)
Continue buspar and add Zyprexa

## 2016-06-29 NOTE — Assessment & Plan Note (Signed)
Avoid offending foods, start probiotics. Do not eat large meals in late evening and consider raising head of bed. PPI prn

## 2016-06-29 NOTE — Assessment & Plan Note (Signed)
Well controlled, no changes to meds. Encouraged heart healthy diet such as the DASH diet and exercise as tolerated.  °

## 2016-06-29 NOTE — Assessment & Plan Note (Signed)
Encouraged good sleep hygiene such as dark, quiet room. No blue/green glowing lights such as computer screens in bedroom. No alcohol or stimulants in evening. Cut down on caffeine as able. Regular exercise is helpful but not just prior to bed time.  Ambien prn 

## 2016-06-29 NOTE — Progress Notes (Signed)
Patient ID: Alexandra Patterson, female   DOB: May 15, 1956, 60 y.o.   MRN: [id number]   Subjective:    Patient ID: Alexandra Patterson, female    DOB: 1955-12-22, 60 y.o.   MRN: [id number]  Chief Complaint  Patient presents with  . Follow-up    HPI Patient is in today for follow up. She acknowledges feeling depressed and struggling with anhedonia. Has had numerous family stressors. No suicidal ideation. Has numerous family stressors. Denies CP/palp/SOB/HA/congestion/fevers/GI or GU c/o. Taking meds as prescribed  Past Medical History:  Diagnosis Date  . Allergy   . Anxiety   . Atrial fibrillation ([place])   . Cervical cancer screening 09/07/2015   Menarche at 11 Irregular and heavy and painful flow secondary to fibroids No history of abnormal pap in past, last pap roughly 2003 G2P2, s/p 2 SVD history of abnormal MGM, 1 abnl bx right breast benign, normal otherwise Noconcerns today TAH b/l SPO for fibroids and migrainesand breast bx on right gyn surgeries  . Cough 08/13/2015  . Depression   . Depression with anxiety 06/29/2016  . Fainting    once to due heart out of rythm  . Fibromyalgia   . Frequent headaches   . GERD (gastroesophageal reflux disease)   . History of chicken pox   . Hx of blood clots    dvt  . Hyperlipidemia   . Hyperplastic colon polyp   . Hypertension   . Insomnia 08/13/2015  . Internal hemorrhoids   . Lesion of lung    xray in 2014 thought to be benign seen at [facility] pulmonology  . Measles    h/o  . Migraine   . Obesity   . S/P AVR (aortic valve replacement) 03/19/2015  . UTI (lower urinary tract infection)   . UTI (urinary tract infection) 02/11/2016    Past Surgical History:  Procedure Laterality Date  . ABDOMINAL HYSTERECTOMY     TAH SPO  . ATRIAL ABLATION SURGERY    . DILATION AND CURETTAGE OF UTERUS     x 3  . loop heart    . SHOULDER SURGERY Left    arthroscopy for spurs  . TONSILLECTOMY    . TUBAL LIGATION      Family History  Problem Relation Age  of Onset  . Colon cancer      parent  . Colon polyps Father   . Alzheimer's disease Father   . Alcohol abuse Father   . Arthritis Mother   . Neuropathy Mother   . Hyperlipidemia Mother   . Other Mother     familial mediterranean fever  . Cancer Brother     prostate cancer  . Heart disease Brother     cardiomegaly  . Other Brother     familial mediterranean fever  . Asthma Maternal Grandmother   . Congestive Heart Failure Maternal Grandmother   . Heart disease Maternal Grandmother     chf  . Stroke Maternal Grandfather   . Diabetes Maternal Grandfather   . Heart disease Maternal Grandfather     hardening of the arteries  . Stroke Paternal Grandfather   . Atrial fibrillation Paternal Grandfather   . Alcohol abuse Paternal Grandfather   . Heart disease Paternal Grandfather     afib  . Arthritis Paternal Uncle   . Interstitial cystitis Daughter   . Arthritis Son   . Alcohol abuse Son   . Mental illness Son     depression  . Thyroid disease Mother  Social History   Social History  . Marital status: Married    Spouse name: N/A  . Number of children: 2  . Years of education: N/A   Occupational History  . [place]   Social History Main Topics  . Smoking status: Never Smoker  . Smokeless tobacco: Never Used  . Alcohol use No  . Drug use: No  . Sexual activity: Yes     Comment: lives with husband and adult son, no major dietary restrictions, full diability   Other Topics Concern  . Not on file   Social History Narrative  . No narrative on file    Outpatient Medications Prior to Visit  Medication Sig Dispense Refill  . busPIRone (BUSPAR) 7.5 MG tablet Take 1 tablet (7.5 mg total) by mouth 3 (three) times daily. 90 tablet 2  . butalbital-acetaminophen-caffeine (FIORICET, ESGIC) 50-325-40 MG tablet Take 1 tablet by mouth 2 (two) times daily as needed for headache. 30 tablet 1  . Cholecalciferol (VITAMIN D3) 5000 units CAPS Take  5,000 Units by mouth daily. Reported on 02/13/2016    . esomeprazole (NEXIUM) 40 MG capsule TAKE 1 CAPSULE(40 MG) BY MOUTH TWICE DAILY BEFORE A MEAL 60 capsule 5  . KRILL OIL PO Take 1 mg by mouth daily. Reported on 02/13/2016    . levothyroxine (SYNTHROID, LEVOTHROID) 125 MCG tablet Take 1 tablet (125 mcg total) by mouth daily. 30 tablet 3  . losartan (COZAAR) 100 MG tablet Take 1 tablet (100 mg total) by mouth daily. 30 tablet 3  . Magnesium 400 MG CAPS Take 400 mg by mouth daily. Reported on 02/13/2016    . meloxicam (MOBIC) 15 MG tablet Take 15 mg by mouth daily.    . metoprolol succinate (TOPROL-XL) 100 MG 24 hr tablet Take 1 tablet (100 mg total) by mouth 2 (two) times daily. Take with or immediately following a meal. 60 tablet 6  . metoprolol succinate (TOPROL-XL) 50 MG 24 hr tablet Take with the 100 mg daily to equal to 150 mg dose. 90 tablet 3  . ondansetron (ZOFRAN) 4 MG tablet TAKE 1 TABLET BY MOUTH EVERY 6 TO 8 HOURS AS NEEDED 30 tablet 2  . rizatriptan (MAXALT) 10 MG tablet Take 10 mg by mouth as needed for migraine. May repeat in 2 hours if needed    . simvastatin (ZOCOR) 20 MG tablet Take 1 tablet (20 mg total) by mouth every evening. 30 tablet 6  . topiramate (TOPAMAX) 50 MG tablet Takes 75 mg daily    . traMADol (ULTRAM) 50 MG tablet TAKE 1 TABLET BY MOUTH TWICE DAILY AS NEEDED FOR MODERATE PAIN 60 tablet 0  . triamcinolone (NASACORT ALLERGY 24HR) 55 MCG/ACT AERO nasal inhaler Place 2 sprays into the nose 2 (two) times daily.    Marland Kitchen zolpidem (AMBIEN CR) 12.5 MG CR tablet Take 1 tablet (12.5 mg total) by mouth at bedtime as needed. for sleep 30 tablet 3  . DULoxetine (CYMBALTA) 60 MG capsule TAKE 1 CAPSULE(60 MG) BY MOUTH TWICE DAILY 60 capsule 5   No facility-administered medications prior to visit.     Allergies  Allergen Reactions  . Sulfa Antibiotics Hives  . Cortisone Other (See Comments)    Red flush and anxiety  . Erythromycin Rash  . Prednisone Rash and Anxiety     Review of Systems  Constitutional: Positive for malaise/fatigue. Negative for fever.  HENT: Negative for congestion.   Eyes: Negative for blurred vision.  Respiratory: Negative for shortness  of breath.   Cardiovascular: Negative for chest pain, palpitations and leg swelling.  Gastrointestinal: Negative for abdominal pain, blood in stool and nausea.  Genitourinary: Negative for dysuria and frequency.  Musculoskeletal: Negative for falls.  Skin: Negative for rash.  Neurological: Negative for dizziness, loss of consciousness and headaches.  Endo/Heme/Allergies: Negative for environmental allergies.  Psychiatric/Behavioral: Positive for depression. The patient is not nervous/anxious.        Objective:    Physical Exam  Constitutional: She is oriented to person, place, and time. She appears well-developed and well-nourished. No distress.  HENT:  Head: Normocephalic and atraumatic.  Nose: Nose normal.  Eyes: Right eye exhibits no discharge. Left eye exhibits no discharge.  Neck: Normal range of motion. Neck supple.  Cardiovascular: Normal rate and regular rhythm.   No murmur heard. Pulmonary/Chest: Effort normal and breath sounds normal.  Abdominal: Soft. Bowel sounds are normal. There is no tenderness.  Musculoskeletal: She exhibits no edema.  Neurological: She is alert and oriented to person, place, and time.  Skin: Skin is warm and dry.  Psychiatric: She has a normal mood and affect.  Nursing note and vitals reviewed.   BP (!) 131/56 (BP Location: Left Arm, Patient Position: Sitting, Cuff Size: Normal)   Pulse 71   Temp 98.5 F (36.9 C) (Oral)   Wt 246 lb 9.6 oz (111.9 kg)   SpO2 100%   BMI 37.50 kg/m  Wt Readings from Last 3 Encounters:  06/20/16 246 lb 9.6 oz (111.9 kg)  06/04/16 249 lb (112.9 kg)  05/31/16 246 lb 9.6 oz (111.9 kg)     Lab Results  Component Value Date   WBC 5.6 02/27/2016   HGB 11.6 (L) 02/27/2016   HCT 35.6 (L) 02/27/2016   PLT 231.0  02/27/2016   GLUCOSE 91 01/08/2016   CHOL 172 01/08/2016   TRIG 100.0 01/08/2016   HDL 55.10 01/08/2016   LDLCALC 96 01/08/2016   ALT 11 01/08/2016   AST 13 01/08/2016   NA 142 01/08/2016   K 3.9 01/08/2016   CL 108 01/08/2016   CREATININE 0.78 01/08/2016   BUN 23 01/08/2016   CO2 26 01/08/2016   TSH 2.18 06/04/2016    Lab Results  Component Value Date   TSH 2.18 06/04/2016   Lab Results  Component Value Date   WBC 5.6 02/27/2016   HGB 11.6 (L) 02/27/2016   HCT 35.6 (L) 02/27/2016   MCV 79.5 02/27/2016   PLT 231.0 02/27/2016   Lab Results  Component Value Date   NA 142 01/08/2016   K 3.9 01/08/2016   CO2 26 01/08/2016   GLUCOSE 91 01/08/2016   BUN 23 01/08/2016   CREATININE 0.78 01/08/2016   BILITOT 0.5 01/08/2016   ALKPHOS 82 01/08/2016   AST 13 01/08/2016   ALT 11 01/08/2016   PROT 6.4 01/08/2016   ALBUMIN 3.8 01/08/2016   CALCIUM 9.4 01/08/2016   GFR 79.88 01/08/2016   Lab Results  Component Value Date   CHOL 172 01/08/2016   Lab Results  Component Value Date   HDL 55.10 01/08/2016   Lab Results  Component Value Date   LDLCALC 96 01/08/2016   Lab Results  Component Value Date   TRIG 100.0 01/08/2016   Lab Results  Component Value Date   CHOLHDL 3 01/08/2016   No results found for: HGBA1C     Assessment & Plan:   Problem List Items Addressed This Visit    GERD (gastroesophageal reflux disease)    Avoid  offending foods, start probiotics. Do not eat large meals in late evening and consider raising head of bed. PPI prn      Hyperlipidemia    Encouraged heart healthy diet, increase exercise, avoid trans fats, consider a krill oil cap daily      Hypertension    Well controlled, no changes to meds. Encouraged heart healthy diet such as the DASH diet and exercise as tolerated.       Migraine    Encouraged increased hydration, 64 ounces of clear fluids daily. Minimize alcohol and caffeine. Eat small frequent meals with lean proteins and  complex carbs. Avoid high and low blood sugars. Get adequate sleep, 7-8 hours a night. Needs exercise daily preferably in the morning. bupap and maxalt prn      Insomnia    Encouraged good sleep hygiene such as dark, quiet room. No blue/green glowing lights such as computer screens in bedroom. No alcohol or stimulants in evening. Cut down on caffeine as able. Regular exercise is helpful but not just prior to bed time. Ambien prn      Depression with anxiety    Continue buspar and add Zyprexa       Other Visit Diagnoses   None.     I am having Ms. Buskirk maintain her meloxicam, ondansetron, rizatriptan, topiramate, triamcinolone, simvastatin, KRILL OIL PO, Magnesium, Vitamin D3, busPIRone, butalbital-acetaminophen-caffeine, metoprolol succinate, esomeprazole, losartan, levothyroxine, zolpidem, metoprolol succinate, and traMADol.  Meds ordered this encounter  Medications  . DISCONTD: ARIPiprazole (ABILIFY) 5 MG tablet    Sig: Take 1 tablet (5 mg total) by mouth daily.    Dispense:  30 tablet    Refill:  2     Penni Homans, MD

## 2016-06-29 NOTE — Assessment & Plan Note (Addendum)
Encouraged increased hydration, 64 ounces of clear fluids daily. Minimize alcohol and caffeine. Eat small frequent meals with lean proteins and complex carbs. Avoid high and low blood sugars. Get adequate sleep, 7-8 hours a night. Needs exercise daily preferably in the morning. bupap and maxalt prn

## 2016-07-01 ENCOUNTER — Encounter: Payer: Self-pay | Admitting: Family Medicine

## 2016-07-01 ENCOUNTER — Other Ambulatory Visit: Payer: Self-pay | Admitting: Family Medicine

## 2016-07-01 DIAGNOSIS — E0590 Thyrotoxicosis, unspecified without thyrotoxic crisis or storm: Secondary | ICD-10-CM

## 2016-07-09 ENCOUNTER — Ambulatory Visit (INDEPENDENT_AMBULATORY_CARE_PROVIDER_SITE_OTHER): Payer: Managed Care, Other (non HMO) | Admitting: Psychology

## 2016-07-09 DIAGNOSIS — F4323 Adjustment disorder with mixed anxiety and depressed mood: Secondary | ICD-10-CM

## 2016-07-18 ENCOUNTER — Ambulatory Visit: Payer: Self-pay | Admitting: Family Medicine

## 2016-07-25 ENCOUNTER — Encounter: Payer: Self-pay | Admitting: Family Medicine

## 2016-07-25 ENCOUNTER — Other Ambulatory Visit: Payer: Self-pay

## 2016-07-25 MED ORDER — TRAMADOL HCL 50 MG PO TABS: ORAL_TABLET | ORAL | 0 refills | Status: DC

## 2016-07-25 NOTE — Telephone Encounter (Signed)
Last seen 06/20/16 Last filled 06/16/16 #[id number] rf Please advise PC

## 2016-07-26 NOTE — Telephone Encounter (Signed)
Faxed hardcopy for Tramadol to [facility] in [place]

## 2016-07-29 ENCOUNTER — Other Ambulatory Visit: Payer: Self-pay | Admitting: Endocrinology

## 2016-07-29 NOTE — Telephone Encounter (Signed)
Please refill synthroid prn Please refer losartan refill to PCP

## 2016-07-31 ENCOUNTER — Other Ambulatory Visit: Payer: Self-pay

## 2016-07-31 NOTE — Telephone Encounter (Signed)
error 

## 2016-08-01 ENCOUNTER — Ambulatory Visit (INDEPENDENT_AMBULATORY_CARE_PROVIDER_SITE_OTHER): Payer: Managed Care, Other (non HMO) | Admitting: Family Medicine

## 2016-08-01 VITALS — BP 124/84 | HR 72 | Temp 98.1°F | Wt 248.0 lb

## 2016-08-01 DIAGNOSIS — M545 Low back pain: Secondary | ICD-10-CM

## 2016-08-01 DIAGNOSIS — D649 Anemia, unspecified: Secondary | ICD-10-CM

## 2016-08-01 DIAGNOSIS — Z0000 Encounter for general adult medical examination without abnormal findings: Secondary | ICD-10-CM

## 2016-08-01 DIAGNOSIS — E782 Mixed hyperlipidemia: Secondary | ICD-10-CM

## 2016-08-01 DIAGNOSIS — M797 Fibromyalgia: Secondary | ICD-10-CM

## 2016-08-01 DIAGNOSIS — I10 Essential (primary) hypertension: Secondary | ICD-10-CM

## 2016-08-01 DIAGNOSIS — E669 Obesity, unspecified: Secondary | ICD-10-CM

## 2016-08-01 DIAGNOSIS — G8929 Other chronic pain: Secondary | ICD-10-CM

## 2016-08-01 DIAGNOSIS — M791 Myalgia, unspecified site: Secondary | ICD-10-CM

## 2016-08-01 DIAGNOSIS — M542 Cervicalgia: Secondary | ICD-10-CM | POA: Diagnosis not present

## 2016-08-01 DIAGNOSIS — K219 Gastro-esophageal reflux disease without esophagitis: Secondary | ICD-10-CM

## 2016-08-01 DIAGNOSIS — E785 Hyperlipidemia, unspecified: Secondary | ICD-10-CM

## 2016-08-01 LAB — CBC
HCT: 41.8 % (ref 36.0–46.0)
Hemoglobin: 13.9 g/dL (ref 12.0–15.0)
MCHC: 33.2 g/dL (ref 30.0–36.0)
MCV: 91.8 fl (ref 78.0–100.0)
PLATELETS: 273 10*3/uL (ref 150.0–400.0)
RBC: 4.55 Mil/uL (ref 3.87–5.11)
RDW: 14.2 % (ref 11.5–15.5)
WBC: 8 10*3/uL (ref 4.0–10.5)

## 2016-08-01 LAB — COMPREHENSIVE METABOLIC PANEL
ALBUMIN: 4.2 g/dL (ref 3.5–5.2)
ALK PHOS: 77 U/L (ref 39–117)
ALT: 12 U/L (ref 0–35)
AST: 15 U/L (ref 0–37)
BUN: 19 mg/dL (ref 6–23)
CALCIUM: 9.5 mg/dL (ref 8.4–10.5)
CO2: 27 mEq/L (ref 19–32)
Chloride: 106 mEq/L (ref 96–112)
Creatinine, Ser: 0.93 mg/dL (ref 0.40–1.20)
GFR: 65.08 mL/min (ref >60.00)
Glucose, Bld: 91 mg/dL (ref 70–99)
POTASSIUM: 4.6 meq/L (ref 3.5–5.1)
Sodium: 140 mEq/L (ref 135–145)
TOTAL PROTEIN: 6.7 g/dL (ref 6.0–8.3)
Total Bilirubin: 0.6 mg/dL (ref 0.2–1.2)

## 2016-08-01 LAB — LIPID PANEL
CHOLESTEROL: 215 mg/dL — AB (ref 0–200)
HDL: 65.2 mg/dL (ref >39.00)
LDL Cholesterol: 127 mg/dL — ABNORMAL HIGH (ref 0–99)
NonHDL: 149.89
Total CHOL/HDL Ratio: 3
Triglycerides: 112 mg/dL (ref 0.0–149.0)
VLDL: 22.4 mg/dL (ref 0.0–40.0)

## 2016-08-01 LAB — TSH: TSH: 2.23 u[IU]/mL (ref 0.35–4.50)

## 2016-08-01 NOTE — Patient Instructions (Addendum)
[email or web address], Krill oil, Vitamin D, Aged or Black Garlic daily Dr Redgie Grayer, weight loss doctor  Anemia, Nonspecific Anemia is a condition in which the concentration of red blood cells or hemoglobin in the blood is below normal. Hemoglobin is a substance in red blood cells that carries oxygen to the tissues of the body. Anemia results in not enough oxygen reaching these tissues.  CAUSES  Common causes of anemia include:   Excessive bleeding. Bleeding may be internal or external. This includes excessive bleeding from periods (in women) or from the intestine.   Poor nutrition.   Chronic kidney, thyroid, and liver disease.  Bone marrow disorders that decrease red blood cell production.  Cancer and treatments for cancer.  HIV, AIDS, and their treatments.  Spleen problems that increase red blood cell destruction.  Blood disorders.  Excess destruction of red blood cells due to infection, medicines, and autoimmune disorders. SIGNS AND SYMPTOMS   Minor weakness.   Dizziness.   Headache.  Palpitations.   Shortness of breath, especially with exercise.   Paleness.  Cold sensitivity.  Indigestion.  Nausea.  Difficulty sleeping.  Difficulty concentrating. Symptoms may occur suddenly or they may develop slowly.  DIAGNOSIS  Additional blood tests are often needed. These help your health care provider determine the best treatment. Your health care provider will check your stool for blood and look for other causes of blood loss.  TREATMENT  Treatment varies depending on the cause of the anemia. Treatment can include:   Supplements of iron, vitamin 123456, or folic acid.   Hormone medicines.   A blood transfusion. This may be needed if blood loss is severe.   Hospitalization. This may be needed if there is significant continual blood loss.   Dietary changes.  Spleen removal. HOME CARE INSTRUCTIONS Keep all follow-up appointments.  It often takes many weeks to correct anemia, and having your health care provider check on your condition and your response to treatment is very important. SEEK IMMEDIATE MEDICAL CARE IF:   You develop extreme weakness, shortness of breath, or chest pain.   You become dizzy or have trouble concentrating.  You develop heavy vaginal bleeding.   You develop a rash.   You have bloody or black, tarry stools.   You faint.   You vomit up blood.   You vomit repeatedly.   You have abdominal pain.  You have a fever or persistent symptoms for more than 2-3 days.   You have a fever and your symptoms suddenly get worse.   You are dehydrated.  MAKE SURE YOU:  Understand these instructions.  Will watch your condition.  Will get help right away if you are not doing well or get worse.   This information is not intended to replace advice given to you by your health care provider. Make sure you discuss any questions you have with your health care provider.   Document Released: 10/24/2004 Document Revised: 05/19/2013 Document Reviewed: 03/12/2013 Elsevier Interactive Patient Education [employer].

## 2016-08-01 NOTE — Progress Notes (Signed)
Pre visit review using our clinic review tool, if applicable. No additional management support is needed unless otherwise documented below in the visit note. 

## 2016-08-02 ENCOUNTER — Encounter: Payer: Self-pay | Admitting: Endocrinology

## 2016-08-02 ENCOUNTER — Encounter: Payer: Self-pay | Admitting: Family Medicine

## 2016-08-02 ENCOUNTER — Other Ambulatory Visit: Payer: Self-pay

## 2016-08-02 ENCOUNTER — Other Ambulatory Visit: Payer: Self-pay | Admitting: Family Medicine

## 2016-08-02 DIAGNOSIS — E0590 Thyrotoxicosis, unspecified without thyrotoxic crisis or storm: Secondary | ICD-10-CM

## 2016-08-02 MED ORDER — LOSARTAN POTASSIUM 100 MG PO TABS: 100.0000 mg | ORAL_TABLET | Freq: Every day | ORAL | 3 refills | Status: DC

## 2016-08-04 NOTE — Assessment & Plan Note (Signed)
Well controlled, no changes to meds. Encouraged heart healthy diet such as the DASH diet and exercise as tolerated.  °

## 2016-08-04 NOTE — Assessment & Plan Note (Signed)
Encouraged heart healthy diet, increase exercise, avoid trans fats, consider a krill oil cap daily 

## 2016-08-04 NOTE — Assessment & Plan Note (Addendum)
Encouraged DASH diet, decrease po intake and increase exercise as tolerated. Needs 7-8 hours of sleep nightly. Avoid trans fats, eat small, frequent meals every 4-5 hours with lean proteins, complex carbs and healthy fats. Minimize simple carbs 

## 2016-08-04 NOTE — Progress Notes (Signed)
Patient ID: Alexandra Patterson, female   DOB: 02-Jul-1956, 60 y.o.   MRN: [id number]   Subjective:    Patient ID: Alexandra Patterson, female    DOB: Jan 09, 1956, 60 y.o.   MRN: [id number]  Chief Complaint  Patient presents with  . Follow-up    HPI Patient is in today for follow up. She continues to struggle with daily pain despite no recent injury or illness. She has diffuse pain and fatigue. Denies CP/palp/SOB/HA/congestion/fevers/GI or GU c/o. Taking meds as prescribed  Past Medical History:  Diagnosis Date  . Allergy   . Anxiety   . Atrial fibrillation ([place])   . Cervical cancer screening 09/07/2015   Menarche at 11 Irregular and heavy and painful flow secondary to fibroids No history of abnormal pap in past, last pap roughly 2003 G2P2, s/p 2 SVD history of abnormal MGM, 1 abnl bx right breast benign, normal otherwise Noconcerns today TAH b/l SPO for fibroids and migrainesand breast bx on right gyn surgeries  . Cough 08/13/2015  . Depression   . Depression with anxiety 06/29/2016  . Fainting    once to due heart out of rythm  . Fibromyalgia   . Frequent headaches   . GERD (gastroesophageal reflux disease)   . History of chicken pox   . Hx of blood clots    dvt  . Hyperlipidemia   . Hyperplastic colon polyp   . Hypertension   . Insomnia 08/13/2015  . Internal hemorrhoids   . Lesion of lung    xray in 2014 thought to be benign seen at [facility] pulmonology  . Measles    h/o  . Migraine   . Obesity   . S/P AVR (aortic valve replacement) 03/19/2015  . UTI (lower urinary tract infection)   . UTI (urinary tract infection) 02/11/2016    Past Surgical History:  Procedure Laterality Date  . ABDOMINAL HYSTERECTOMY     TAH SPO  . ATRIAL ABLATION SURGERY    . DILATION AND CURETTAGE OF UTERUS     x 3  . loop heart    . SHOULDER SURGERY Left    arthroscopy for spurs  . TONSILLECTOMY    . TUBAL LIGATION      Family History  Problem Relation Age of Onset  . Colon cancer      parent    . Colon polyps Father   . Alzheimer's disease Father   . Alcohol abuse Father   . Arthritis Mother   . Neuropathy Mother   . Hyperlipidemia Mother   . Other Mother     familial mediterranean fever  . Cancer Brother     prostate cancer  . Heart disease Brother     cardiomegaly  . Other Brother     familial mediterranean fever  . Asthma Maternal Grandmother   . Congestive Heart Failure Maternal Grandmother   . Heart disease Maternal Grandmother     chf  . Stroke Maternal Grandfather   . Diabetes Maternal Grandfather   . Heart disease Maternal Grandfather     hardening of the arteries  . Stroke Paternal Grandfather   . Atrial fibrillation Paternal Grandfather   . Alcohol abuse Paternal Grandfather   . Heart disease Paternal Grandfather     afib  . Arthritis Paternal Uncle   . Interstitial cystitis Daughter   . Arthritis Son   . Alcohol abuse Son   . Mental illness Son     depression  . Thyroid disease Mother  Social History   Social History  . Marital status: Married    Spouse name: N/A  . Number of children: 2  . Years of education: N/A   Occupational History  . [employer]   Social History Main Topics  . Smoking status: Never Smoker  . Smokeless tobacco: Never Used  . Alcohol use No  . Drug use: No  . Sexual activity: Yes     Comment: lives with husband and adult son, no major dietary restrictions, full diability   Other Topics Concern  . Not on file   Social History Narrative  . No narrative on file    Outpatient Medications Prior to Visit  Medication Sig Dispense Refill  . busPIRone (BUSPAR) 7.5 MG tablet Take 1 tablet (7.5 mg total) by mouth 3 (three) times daily. 90 tablet 2  . butalbital-acetaminophen-caffeine (FIORICET, ESGIC) 50-325-40 MG tablet Take 1 tablet by mouth 2 (two) times daily as needed for headache. 30 tablet 1  . Cholecalciferol (VITAMIN D3) 5000 units CAPS Take 5,000 Units by mouth daily. Reported on  02/13/2016    . esomeprazole (NEXIUM) 40 MG capsule TAKE 1 CAPSULE(40 MG) BY MOUTH TWICE DAILY BEFORE A MEAL 60 capsule 5  . KRILL OIL PO Take 1 mg by mouth daily. Reported on 02/13/2016    . levothyroxine (SYNTHROID, LEVOTHROID) 125 MCG tablet TAKE 1 TABLET(125 MCG) BY MOUTH DAILY 30 tablet 0  . Magnesium 400 MG CAPS Take 400 mg by mouth daily. Reported on 02/13/2016    . meloxicam (MOBIC) 15 MG tablet Take 15 mg by mouth daily.    . metoprolol succinate (TOPROL-XL) 100 MG 24 hr tablet Take 1 tablet (100 mg total) by mouth 2 (two) times daily. Take with or immediately following a meal. 60 tablet 6  . metoprolol succinate (TOPROL-XL) 50 MG 24 hr tablet Take with the 100 mg daily to equal to 150 mg dose. 90 tablet 3  . ondansetron (ZOFRAN) 4 MG tablet TAKE 1 TABLET BY MOUTH EVERY 6 TO 8 HOURS AS NEEDED 30 tablet 2  . rizatriptan (MAXALT) 10 MG tablet Take 10 mg by mouth as needed for migraine. May repeat in 2 hours if needed    . simvastatin (ZOCOR) 20 MG tablet Take 1 tablet (20 mg total) by mouth every evening. 30 tablet 6  . topiramate (TOPAMAX) 50 MG tablet Takes 75 mg daily    . traMADol (ULTRAM) 50 MG tablet TAKE 1 TABLET BY MOUTH TWICE DAILY AS NEEDED FOR MODERATE PAIN 60 tablet 0  . triamcinolone (NASACORT ALLERGY 24HR) 55 MCG/ACT AERO nasal inhaler Place 2 sprays into the nose 2 (two) times daily.    Marland Kitchen zolpidem (AMBIEN CR) 12.5 MG CR tablet Take 1 tablet (12.5 mg total) by mouth at bedtime as needed. for sleep 30 tablet 3  . losartan (COZAAR) 100 MG tablet Take 1 tablet (100 mg total) by mouth daily. 30 tablet 3  . metoprolol succinate (TOPROL-XL) 50 MG 24 hr tablet TAKE 1 TABLET BY MOUTH TWICE DAILY( TAKE WITH OR IMMEDIATELY FOLLOWING A MEAL) 60 tablet 0  . desvenlafaxine (PRISTIQ) 50 MG 24 hr tablet Take 1 tablet (50 mg total) by mouth daily. Patient has tried/failed Cymbalta and Effexor. 30 tablet 2  . OLANZapine (ZYPREXA) 2.5 MG tablet Take 1 tablet (2.5 mg total) by mouth daily. 30 tablet  1   No facility-administered medications prior to visit.     Allergies  Allergen Reactions  . Sulfa Antibiotics Hives  .  Cortisone Other (See Comments)    Red flush and anxiety  . Erythromycin Rash  . Prednisone Rash and Anxiety    Review of Systems  Constitutional: Negative for fever and malaise/fatigue.  HENT: Negative for congestion.   Eyes: Negative for blurred vision.  Respiratory: Negative for shortness of breath.   Cardiovascular: Negative for chest pain, palpitations and leg swelling.  Gastrointestinal: Negative for abdominal pain, blood in stool and nausea.  Genitourinary: Negative for dysuria and frequency.  Musculoskeletal: Positive for back pain, joint pain and myalgias. Negative for falls.  Skin: Negative for rash.  Neurological: Negative for dizziness, loss of consciousness and headaches.  Endo/Heme/Allergies: Negative for environmental allergies.  Psychiatric/Behavioral: Negative for depression. The patient is not nervous/anxious.        Objective:    Physical Exam  Constitutional: She is oriented to person, place, and time. She appears well-developed and well-nourished. No distress.  HENT:  Head: Normocephalic and atraumatic.  Nose: Nose normal.  Eyes: Right eye exhibits no discharge. Left eye exhibits no discharge.  Neck: Normal range of motion. Neck supple.  Cardiovascular: Normal rate and regular rhythm.   No murmur heard. Pulmonary/Chest: Effort normal and breath sounds normal.  Abdominal: Soft. Bowel sounds are normal. There is no tenderness.  Musculoskeletal: She exhibits no edema.  Neurological: She is alert and oriented to person, place, and time.  Skin: Skin is warm and dry.  Psychiatric: She has a normal mood and affect.  Nursing note and vitals reviewed.   BP 124/84 (BP Location: Left Arm, Patient Position: Sitting, Cuff Size: Normal)   Pulse 72   Temp 98.1 F (36.7 C) (Oral)   Wt 248 lb (112.5 kg)   SpO2 97%   BMI 37.71 kg/m  Wt  Readings from Last 3 Encounters:  08/01/16 248 lb (112.5 kg)  06/20/16 246 lb 9.6 oz (111.9 kg)  06/04/16 249 lb (112.9 kg)     Lab Results  Component Value Date   WBC 8.0 08/01/2016   HGB 13.9 08/01/2016   HCT 41.8 08/01/2016   PLT 273.0 08/01/2016   GLUCOSE 91 08/01/2016   CHOL 215 (H) 08/01/2016   TRIG 112.0 08/01/2016   HDL 65.20 08/01/2016   LDLCALC 127 (H) 08/01/2016   ALT 12 08/01/2016   AST 15 08/01/2016   NA 140 08/01/2016   K 4.6 08/01/2016   CL 106 08/01/2016   CREATININE 0.93 08/01/2016   BUN 19 08/01/2016   CO2 27 08/01/2016   TSH 2.23 08/01/2016    Lab Results  Component Value Date   TSH 2.23 08/01/2016   Lab Results  Component Value Date   WBC 8.0 08/01/2016   HGB 13.9 08/01/2016   HCT 41.8 08/01/2016   MCV 91.8 08/01/2016   PLT 273.0 08/01/2016   Lab Results  Component Value Date   NA 140 08/01/2016   K 4.6 08/01/2016   CO2 27 08/01/2016   GLUCOSE 91 08/01/2016   BUN 19 08/01/2016   CREATININE 0.93 08/01/2016   BILITOT 0.6 08/01/2016   ALKPHOS 77 08/01/2016   AST 15 08/01/2016   ALT 12 08/01/2016   PROT 6.7 08/01/2016   ALBUMIN 4.2 08/01/2016   CALCIUM 9.5 08/01/2016   GFR 65.08 08/01/2016   Lab Results  Component Value Date   CHOL 215 (H) 08/01/2016   Lab Results  Component Value Date   HDL 65.20 08/01/2016   Lab Results  Component Value Date   LDLCALC 127 (H) 08/01/2016   Lab Results  Component Value Date   TRIG 112.0 08/01/2016   Lab Results  Component Value Date   CHOLHDL 3 08/01/2016   No results found for: HGBA1C     Assessment & Plan:   Problem List Items Addressed This Visit    Fibromyalgia    Chronic arthralgias and myalgias. Will refer to pain managmen      GERD (gastroesophageal reflux disease)    Avoid offending foods, start probiotics. Do not eat large meals in late evening and consider raising head of bed.       Hyperlipidemia    Encouraged heart healthy diet, increase exercise, avoid trans  fats, consider a krill oil cap daily      Hypertension    Well controlled, no changes to meds. Encouraged heart healthy diet such as the DASH diet and exercise as tolerated.       Relevant Orders   CBC (Completed)   TSH (Completed)   Comprehensive metabolic panel (Completed)   Obesity    Encouraged DASH diet, decrease po intake and increase exercise as tolerated. Needs 7-8 hours of sleep nightly. Avoid trans fats, eat small, frequent meals every 4-5 hours with lean proteins, complex carbs and healthy fats. Minimize simple carbs      Anemia - Primary    Other Visit Diagnoses    Chronic low back pain, unspecified back pain laterality, with sciatica presence unspecified       Relevant Orders   Ambulatory referral to Pain Clinic   Neck pain       Relevant Orders   Ambulatory referral to Pain Clinic   Myalgia       Relevant Orders   Ambulatory referral to Pain Clinic   Preventative health care       Hyperlipidemia, mixed       Relevant Orders   Lipid panel (Completed)      I have discontinued Ms. Gressman's OLANZapine and desvenlafaxine. I am also having her maintain her meloxicam, ondansetron, rizatriptan, topiramate, triamcinolone, simvastatin, KRILL OIL PO, Magnesium, Vitamin D3, busPIRone, butalbital-acetaminophen-caffeine, metoprolol succinate, esomeprazole, zolpidem, metoprolol succinate, traMADol, and levothyroxine.  No orders of the defined types were placed in this encounter.    Penni Homans, MD

## 2016-08-04 NOTE — Assessment & Plan Note (Signed)
>>  ASSESSMENT AND PLAN FOR CLASS 2 SEVERE OBESITY WITH SERIOUS COMORBIDITY AND BODY MASS INDEX (BMI) OF 36.0 TO 36.9 IN ADULT, UNSPECIFIED OBESITY TYPE (HCC) WRITTEN ON 08/04/2016  9:06 PM BY BLYTH, STACEY A, MD  Encouraged DASH diet, decrease po intake and increase exercise as tolerated. Needs 7-8 hours of sleep nightly. Avoid trans fats, eat small, frequent meals every 4-5 hours with lean proteins, complex carbs and healthy fats. Minimize simple carbs

## 2016-08-04 NOTE — Assessment & Plan Note (Signed)
Chronic arthralgias and myalgias. Will refer to pain managmen

## 2016-08-04 NOTE — Assessment & Plan Note (Signed)
Avoid offending foods, start probiotics. Do not eat large meals in late evening and consider raising head of bed.  

## 2016-08-06 ENCOUNTER — Ambulatory Visit: Payer: Self-pay | Admitting: Endocrinology

## 2016-08-18 NOTE — Progress Notes (Signed)
Subjective:    Patient ID: Alexandra Patterson, female    DOB: 1956/03/27, 60 y.o.   MRN: [id number]  HPI Pt returns for f/u of subacute thyroiditis (dx'ed early 2017, when she presented with hypertyroidism; she has no previous h/o thyroid problems; nuc med scan showed low uptake; in mid-2017, she was rx'ed synthroid due to very high TSH).  Since on synthroid, she feels much better in general.   Past Medical History:  Diagnosis Date  . Allergy   . Anxiety   . Atrial fibrillation ([place])   . Cervical cancer screening 09/07/2015   Menarche at 11 Irregular and heavy and painful flow secondary to fibroids No history of abnormal pap in past, last pap roughly 2003 G2P2, s/p 2 SVD history of abnormal MGM, 1 abnl bx right breast benign, normal otherwise Noconcerns today TAH b/l SPO for fibroids and migrainesand breast bx on right gyn surgeries  . Cough 08/13/2015  . Depression   . Depression with anxiety 06/29/2016  . Fainting    once to due heart out of rythm  . Fibromyalgia   . Frequent headaches   . GERD (gastroesophageal reflux disease)   . History of chicken pox   . Hx of blood clots    dvt  . Hyperlipidemia   . Hyperplastic colon polyp   . Hypertension   . Insomnia 08/13/2015  . Internal hemorrhoids   . Lesion of lung    xray in 2014 thought to be benign seen at [facility] pulmonology  . Measles    h/o  . Migraine   . Obesity   . S/P AVR (aortic valve replacement) 03/19/2015  . UTI (lower urinary tract infection)   . UTI (urinary tract infection) 02/11/2016    Past Surgical History:  Procedure Laterality Date  . ABDOMINAL HYSTERECTOMY     TAH SPO  . ATRIAL ABLATION SURGERY    . DILATION AND CURETTAGE OF UTERUS     x 3  . loop heart    . SHOULDER SURGERY Left    arthroscopy for spurs  . TONSILLECTOMY    . TUBAL LIGATION      Social History   Social History  . Marital status: Married    Spouse name: N/A  . Number of children: 2  . Years of education: N/A   Occupational  History  . [employer]   Social History Main Topics  . Smoking status: Never Smoker  . Smokeless tobacco: Never Used  . Alcohol use No  . Drug use: No  . Sexual activity: Yes     Comment: lives with husband and adult son, no major dietary restrictions, full diability   Other Topics Concern  . Not on file   Social History Narrative  . No narrative on file    Current Outpatient Prescriptions on File Prior to Visit  Medication Sig Dispense Refill  . busPIRone (BUSPAR) 7.5 MG tablet Take 1 tablet (7.5 mg total) by mouth 3 (three) times daily. 90 tablet 2  . butalbital-acetaminophen-caffeine (FIORICET, ESGIC) 50-325-40 MG tablet Take 1 tablet by mouth 2 (two) times daily as needed for headache. 30 tablet 1  . Cholecalciferol (VITAMIN D3) 5000 units CAPS Take 5,000 Units by mouth daily. Reported on 02/13/2016    . esomeprazole (NEXIUM) 40 MG capsule TAKE 1 CAPSULE(40 MG) BY MOUTH TWICE DAILY BEFORE A MEAL 60 capsule 5  . KRILL OIL PO Take 1 mg by mouth daily. Reported on 02/13/2016    .  levothyroxine (SYNTHROID, LEVOTHROID) 125 MCG tablet TAKE 1 TABLET(125 MCG) BY MOUTH DAILY 30 tablet 0  . losartan (COZAAR) 100 MG tablet Take 1 tablet (100 mg total) by mouth daily. 30 tablet 3  . Magnesium 400 MG CAPS Take 400 mg by mouth daily. Reported on 02/13/2016    . meloxicam (MOBIC) 15 MG tablet Take 15 mg by mouth daily.    . metoprolol succinate (TOPROL-XL) 100 MG 24 hr tablet Take 1 tablet (100 mg total) by mouth 2 (two) times daily. Take with or immediately following a meal. 60 tablet 6  . metoprolol succinate (TOPROL-XL) 50 MG 24 hr tablet Take with the 100 mg daily to equal to 150 mg dose. 90 tablet 3  . metoprolol succinate (TOPROL-XL) 50 MG 24 hr tablet TAKE 1 TABLET BY MOUTH TWICE DAILY( TAKE WITH OR IMMEDIATELY FOLLOWING A MEAL 60 tablet 6  . ondansetron (ZOFRAN) 4 MG tablet TAKE 1 TABLET BY MOUTH EVERY 6 TO 8 HOURS AS NEEDED 30 tablet 2  . rizatriptan (MAXALT) 10 MG  tablet Take 10 mg by mouth as needed for migraine. May repeat in 2 hours if needed    . simvastatin (ZOCOR) 20 MG tablet Take 1 tablet (20 mg total) by mouth every evening. 30 tablet 6  . topiramate (TOPAMAX) 50 MG tablet Takes 75 mg daily    . traMADol (ULTRAM) 50 MG tablet TAKE 1 TABLET BY MOUTH TWICE DAILY AS NEEDED FOR MODERATE PAIN 60 tablet 0  . triamcinolone (NASACORT ALLERGY 24HR) 55 MCG/ACT AERO nasal inhaler Place 2 sprays into the nose 2 (two) times daily.     No current facility-administered medications on file prior to visit.     Allergies  Allergen Reactions  . Sulfa Antibiotics Hives  . Cortisone Other (See Comments)    Red flush and anxiety  . Erythromycin Rash  . Prednisone Rash and Anxiety    Family History  Problem Relation Age of Onset  . Colon cancer      parent  . Colon polyps Father   . Alzheimer's disease Father   . Alcohol abuse Father   . Arthritis Mother   . Neuropathy Mother   . Hyperlipidemia Mother   . Other Mother     familial mediterranean fever  . Cancer Brother     prostate cancer  . Heart disease Brother     cardiomegaly  . Other Brother     familial mediterranean fever  . Asthma Maternal Grandmother   . Congestive Heart Failure Maternal Grandmother   . Heart disease Maternal Grandmother     chf  . Stroke Maternal Grandfather   . Diabetes Maternal Grandfather   . Heart disease Maternal Grandfather     hardening of the arteries  . Stroke Paternal Grandfather   . Atrial fibrillation Paternal Grandfather   . Alcohol abuse Paternal Grandfather   . Heart disease Paternal Grandfather     afib  . Arthritis Paternal Uncle   . Interstitial cystitis Daughter   . Arthritis Son   . Alcohol abuse Son   . Mental illness Son     depression  . Thyroid disease Mother     BP 122/80   Pulse 71   Wt 249 lb (112.9 kg)   SpO2 98%   BMI 37.86 kg/m   Review of Systems Denies tremor and palpitations    Objective:   Physical Exam VITAL  SIGNS:  See vs page GENERAL: no distress NECK: There is no palpable thyroid enlargement.  No thyroid nodule is palpable.  No palpable lymphadenopathy at the anterior neck.    Lab Results  Component Value Date   TSH 2.23 08/01/2016      Assessment & Plan:  Hypothyroidism: well-replaced. Please continue the same medication.

## 2016-08-19 ENCOUNTER — Other Ambulatory Visit: Payer: Self-pay | Admitting: Family Medicine

## 2016-08-19 ENCOUNTER — Encounter: Payer: Self-pay | Admitting: Endocrinology

## 2016-08-19 ENCOUNTER — Ambulatory Visit (INDEPENDENT_AMBULATORY_CARE_PROVIDER_SITE_OTHER): Payer: Managed Care, Other (non HMO) | Admitting: Endocrinology

## 2016-08-19 ENCOUNTER — Encounter: Payer: Self-pay | Admitting: Family Medicine

## 2016-08-19 VITALS — BP 122/80 | HR 71 | Wt 249.0 lb

## 2016-08-19 DIAGNOSIS — E061 Subacute thyroiditis: Secondary | ICD-10-CM | POA: Diagnosis not present

## 2016-08-19 LAB — TSH: TSH: 1.37 u[IU]/mL (ref 0.35–4.50)

## 2016-08-19 MED ORDER — ZOLPIDEM TARTRATE ER 12.5 MG PO TBCR: 12.5000 mg | EXTENDED_RELEASE_TABLET | Freq: Every evening | ORAL | 3 refills | Status: DC | PRN

## 2016-08-19 NOTE — Patient Instructions (Addendum)
blood tests are requested for you today.  We'll let you know about the results. Please repeat the thyroid blood test in 4 months, at Dr Frederik Pear office.  If that is normal, all you need to do is to have it checked each year, at your annual checkup with Dr [name].  There is a small chance that the thyroid could become overactive again.   I would be happy to see you back here as needed.

## 2016-08-27 ENCOUNTER — Ambulatory Visit (INDEPENDENT_AMBULATORY_CARE_PROVIDER_SITE_OTHER): Payer: Managed Care, Other (non HMO) | Admitting: Psychology

## 2016-08-27 DIAGNOSIS — F4323 Adjustment disorder with mixed anxiety and depressed mood: Secondary | ICD-10-CM

## 2016-08-28 ENCOUNTER — Other Ambulatory Visit: Payer: Self-pay

## 2016-08-28 MED ORDER — LEVOTHYROXINE SODIUM 125 MCG PO TABS: ORAL_TABLET | ORAL | 5 refills | Status: DC

## 2016-09-06 ENCOUNTER — Encounter: Payer: Self-pay | Admitting: Family Medicine

## 2016-09-06 ENCOUNTER — Other Ambulatory Visit: Payer: Self-pay | Admitting: Family Medicine

## 2016-09-06 MED ORDER — TRAMADOL HCL 50 MG PO TABS: ORAL_TABLET | ORAL | 0 refills | Status: DC

## 2016-09-10 ENCOUNTER — Other Ambulatory Visit: Payer: Self-pay | Admitting: Family Medicine

## 2016-09-10 DIAGNOSIS — F430 Acute stress reaction: Secondary | ICD-10-CM

## 2016-09-20 ENCOUNTER — Other Ambulatory Visit: Payer: Self-pay | Admitting: Family Medicine

## 2016-09-20 NOTE — Telephone Encounter (Signed)
Nexium filled to pharmacy as requested. Please advise Cymbalta refill, as this is not on pt's current med list.  Called pt, she reports she is taking Cymbalta 60 mg, 2 caps once daily, not 1 cap BID as prescribed.  Please advise sig if ok to refill.

## 2016-09-20 NOTE — Telephone Encounter (Signed)
[place] for refill for now. I would recommend 60 mg once daily (need to be careful as she is taking Tramadol also). I would like this run by Dr. [name] as well if she is adamant on continuing the 120 mg/day dosing. Warn about serotonin syndrome signs/symptoms such as agitation, confusion, feeling warm, sweating, rapid heart beat, diarrhea, or vomiting. This is rare, but something we must consider at such a high dose.

## 2016-09-20 NOTE — Telephone Encounter (Signed)
Pt notified of recommendations for 60 mg daily and verbalized understanding. States she has a lot of problems w/ depression in the winter and it also helps w/ her fibromyalgia, and that's why her dose of cymbalta is so high. States she does have sweating, but this is an ongoing problem for her. Denies any other symptoms. Medication filled to pharmacy w/ previous sig as requested. To PCP for FYI.

## 2016-09-24 NOTE — Telephone Encounter (Signed)
Thanks you!

## 2016-10-01 ENCOUNTER — Ambulatory Visit (INDEPENDENT_AMBULATORY_CARE_PROVIDER_SITE_OTHER): Payer: Managed Care, Other (non HMO) | Admitting: Psychology

## 2016-10-01 DIAGNOSIS — F4323 Adjustment disorder with mixed anxiety and depressed mood: Secondary | ICD-10-CM | POA: Diagnosis not present

## 2016-10-07 ENCOUNTER — Encounter: Payer: Self-pay | Admitting: Family Medicine

## 2016-10-17 ENCOUNTER — Ambulatory Visit: Payer: Self-pay | Admitting: Family Medicine

## 2016-10-18 ENCOUNTER — Other Ambulatory Visit: Payer: Self-pay | Admitting: Family Medicine

## 2016-10-25 ENCOUNTER — Other Ambulatory Visit: Payer: Self-pay | Admitting: Family Medicine

## 2016-11-05 ENCOUNTER — Ambulatory Visit (INDEPENDENT_AMBULATORY_CARE_PROVIDER_SITE_OTHER): Payer: Managed Care, Other (non HMO) | Admitting: Family Medicine

## 2016-11-05 ENCOUNTER — Ambulatory Visit (INDEPENDENT_AMBULATORY_CARE_PROVIDER_SITE_OTHER): Payer: Managed Care, Other (non HMO) | Admitting: Psychology

## 2016-11-05 VITALS — BP 140/92 | HR 78 | Temp 97.8°F | Wt 251.2 lb

## 2016-11-05 DIAGNOSIS — F430 Acute stress reaction: Secondary | ICD-10-CM

## 2016-11-05 DIAGNOSIS — K219 Gastro-esophageal reflux disease without esophagitis: Secondary | ICD-10-CM

## 2016-11-05 DIAGNOSIS — I10 Essential (primary) hypertension: Secondary | ICD-10-CM

## 2016-11-05 DIAGNOSIS — E038 Other specified hypothyroidism: Secondary | ICD-10-CM

## 2016-11-05 DIAGNOSIS — E785 Hyperlipidemia, unspecified: Secondary | ICD-10-CM

## 2016-11-05 DIAGNOSIS — E669 Obesity, unspecified: Secondary | ICD-10-CM

## 2016-11-05 DIAGNOSIS — F4323 Adjustment disorder with mixed anxiety and depressed mood: Secondary | ICD-10-CM | POA: Diagnosis not present

## 2016-11-05 DIAGNOSIS — E061 Subacute thyroiditis: Secondary | ICD-10-CM

## 2016-11-05 MED ORDER — BUSPIRONE HCL 7.5 MG PO TABS: ORAL_TABLET | ORAL | 5 refills | Status: DC

## 2016-11-05 MED ORDER — DULOXETINE HCL 60 MG PO CPEP: ORAL_CAPSULE | ORAL | 5 refills | Status: DC

## 2016-11-05 NOTE — Patient Instructions (Signed)
Hypothyroidism Hypothyroidism is a disorder of the thyroid. The thyroid is a large gland that is located in the lower front of the neck. The thyroid releases hormones that control how the body works. With hypothyroidism, the thyroid does not make enough of these hormones. What are the causes? Causes of hypothyroidism may include:  Viral infections.  Pregnancy.  Your own defense system (immune system) attacking your thyroid.  Certain medicines.  Birth defects.  Past radiation treatments to your head or neck.  Past treatment with radioactive iodine.  Past surgical removal of part or all of your thyroid.  Problems with the gland that is located in the center of your brain (pituitary).  What are the signs or symptoms? Signs and symptoms of hypothyroidism may include:  Feeling as though you have no energy (lethargy).  Inability to tolerate cold.  Weight gain that is not explained by a change in diet or exercise habits.  Dry skin.  Coarse hair.  Menstrual irregularity.  Slowing of thought processes.  Constipation.  Sadness or depression.  How is this diagnosed? Your health care provider may diagnose hypothyroidism with blood tests and ultrasound tests. How is this treated? Hypothyroidism is treated with medicine that replaces the hormones that your body does not make. After you begin treatment, it may take several weeks for symptoms to go away. Follow these instructions at home:  Take medicines only as directed by your health care provider.  If you start taking any new medicines, tell your health care provider.  Keep all follow-up visits as directed by your health care provider. This is important. As your condition improves, your dosage needs may change. You will need to have blood tests regularly so that your health care provider can watch your condition. Contact a health care provider if:  Your symptoms do not get better with treatment.  You are taking thyroid  replacement medicine and: ? You sweat excessively. ? You have tremors. ? You feel anxious. ? You lose weight rapidly. ? You cannot tolerate heat. ? You have emotional swings. ? You have diarrhea. ? You feel weak. Get help right away if:  You develop chest pain.  You develop an irregular heartbeat.  You develop a rapid heartbeat. This information is not intended to replace advice given to you by your health care provider. Make sure you discuss any questions you have with your health care provider. Document Released: 09/16/2005 Document Revised: 02/22/2016 Document Reviewed: 02/01/2014 Elsevier Interactive Patient Education  2017 [employer]  

## 2016-11-05 NOTE — Progress Notes (Signed)
Pre visit review using our clinic review tool, if applicable. No additional management support is needed unless otherwise documented below in the visit note. 

## 2016-11-05 NOTE — Progress Notes (Signed)
Patient ID: Alexandra Patterson, female   DOB: 1956-02-07, 61 y.o.   MRN: [id number]   Subjective:    Patient ID: Alexandra Patterson, female    DOB: 1956-06-06, 61 y.o.   MRN: [id number]  Chief Complaint  Patient presents with  . Follow-up  . Thyroid Problem  . Numbness   I acted as a Education administrator for Dr. [name]. Princess, RMA  Thyroid Problem  Presents for follow-up visit. Symptoms include fatigue. Patient reports no anxiety, cold intolerance, diarrhea, heat intolerance or palpitations. The symptoms have been worsening.    Patient is in today for follow up for thyroid concerns, patient also complains of L foot numbness. No recent injury or acute febrile illness. She does endorse dry skin and signifificant fatigue. She has not been taking her thyroid meds regularly. The numbness in her foot is intermittent and tolerable. Denies CP/palp/SOB/HA/congestion/fevers/GI or GU c/o. Taking meds as prescribed  Past Medical History:  Diagnosis Date  . Allergy   . Anxiety   . Atrial fibrillation ([place])   . Cervical cancer screening 09/07/2015   Menarche at 11 Irregular and heavy and painful flow secondary to fibroids No history of abnormal pap in past, last pap roughly 2003 G2P2, s/p 2 SVD history of abnormal MGM, 1 abnl bx right breast benign, normal otherwise Noconcerns today TAH b/l SPO for fibroids and migrainesand breast bx on right gyn surgeries  . Cough 08/13/2015  . Depression   . Depression with anxiety 06/29/2016  . Fainting    once to due heart out of rythm  . Fibromyalgia   . Frequent headaches   . GERD (gastroesophageal reflux disease)   . History of chicken pox   . Hx of blood clots    dvt  . Hyperlipidemia   . Hyperplastic colon polyp   . Hypertension   . Insomnia 08/13/2015  . Internal hemorrhoids   . Lesion of lung    xray in 2014 thought to be benign seen at [facility] pulmonology  . Measles    h/o  . Migraine   . Obesity   . S/P AVR (aortic valve replacement) 03/19/2015  . UTI (lower  urinary tract infection)   . UTI (urinary tract infection) 02/11/2016    Past Surgical History:  Procedure Laterality Date  . ABDOMINAL HYSTERECTOMY     TAH SPO  . ATRIAL ABLATION SURGERY    . DILATION AND CURETTAGE OF UTERUS     x 3  . loop heart    . SHOULDER SURGERY Left    arthroscopy for spurs  . TONSILLECTOMY    . TUBAL LIGATION      Family History  Problem Relation Age of Onset  . Colon cancer      parent  . Colon polyps Father   . Alzheimer's disease Father   . Alcohol abuse Father   . Arthritis Mother   . Neuropathy Mother   . Hyperlipidemia Mother   . Other Mother     familial mediterranean fever  . Cancer Brother     prostate cancer  . Heart disease Brother     cardiomegaly  . Other Brother     familial mediterranean fever  . Asthma Maternal Grandmother   . Congestive Heart Failure Maternal Grandmother   . Heart disease Maternal Grandmother     chf  . Stroke Maternal Grandfather   . Diabetes Maternal Grandfather   . Heart disease Maternal Grandfather     hardening of the arteries  . Stroke Paternal  Grandfather   . Atrial fibrillation Paternal Grandfather   . Alcohol abuse Paternal Grandfather   . Heart disease Paternal Grandfather     afib  . Arthritis Paternal Uncle   . Interstitial cystitis Daughter   . Arthritis Son   . Alcohol abuse Son   . Mental illness Son     depression  . Thyroid disease Mother     Social History   Social History  . Marital status: Married    Spouse name: N/A  . Number of children: 2  . Years of education: N/A   Occupational History  . [employer]   Social History Main Topics  . Smoking status: Never Smoker  . Smokeless tobacco: Never Used  . Alcohol use No  . Drug use: No  . Sexual activity: Yes     Comment: lives with husband and adult son, no major dietary restrictions, full diability   Other Topics Concern  . Not on file   Social History Narrative  . No narrative on file     Outpatient Medications Prior to Visit  Medication Sig Dispense Refill  . butalbital-acetaminophen-caffeine (FIORICET, ESGIC) 50-325-40 MG tablet Take 1 tablet by mouth 2 (two) times daily as needed for headache. 30 tablet 1  . Cholecalciferol (VITAMIN D3) 5000 units CAPS Take 5,000 Units by mouth daily. Reported on 02/13/2016    . esomeprazole (NEXIUM) 40 MG capsule TAKE 1 CAPSULE(40 MG) BY MOUTH TWICE DAILY BEFORE A MEAL 60 capsule 0  . KRILL OIL PO Take 1 mg by mouth daily. Reported on 02/13/2016    . losartan (COZAAR) 100 MG tablet Take 1 tablet (100 mg total) by mouth daily. 30 tablet 3  . Magnesium 400 MG CAPS Take 400 mg by mouth daily. Reported on 02/13/2016    . meloxicam (MOBIC) 15 MG tablet Take 15 mg by mouth daily.    . metoprolol succinate (TOPROL-XL) 100 MG 24 hr tablet Take 1 tablet (100 mg total) by mouth 2 (two) times daily. Take with or immediately following a meal. 60 tablet 6  . metoprolol succinate (TOPROL-XL) 50 MG 24 hr tablet TAKE 1 TABLET BY MOUTH TWICE DAILY( TAKE WITH OR IMMEDIATELY FOLLOWING A MEAL 60 tablet 6  . ondansetron (ZOFRAN) 4 MG tablet TAKE 1 TABLET BY MOUTH EVERY 6 TO 8 HOURS AS NEEDED 30 tablet 2  . rizatriptan (MAXALT) 10 MG tablet Take 10 mg by mouth as needed for migraine. May repeat in 2 hours if needed    . simvastatin (ZOCOR) 20 MG tablet TAKE 1 TABLET(20 MG) BY MOUTH EVERY EVENING 30 tablet 0  . topiramate (TOPAMAX) 50 MG tablet Takes 75 mg daily    . traMADol (ULTRAM) 50 MG tablet TAKE 1 TABLET BY MOUTH TWICE DAILY AS NEEDED FOR MODERATE PAIN 60 tablet 0  . triamcinolone (NASACORT ALLERGY 24HR) 55 MCG/ACT AERO nasal inhaler Place 2 sprays into the nose 2 (two) times daily.    Marland Kitchen zolpidem (AMBIEN CR) 12.5 MG CR tablet Take 1 tablet (12.5 mg total) by mouth at bedtime as needed. for sleep 30 tablet 3  . busPIRone (BUSPAR) 7.5 MG tablet TAKE 1 TABLET(7.5 MG) BY MOUTH THREE TIMES DAILY 90 tablet 0  . DULoxetine (CYMBALTA) 60 MG capsule TAKE 1  CAPSULE(60 MG) BY MOUTH TWICE DAILY 60 capsule 0  . metoprolol succinate (TOPROL-XL) 50 MG 24 hr tablet Take with the 100 mg daily to equal to 150 mg dose. 90 tablet 3  . levothyroxine (SYNTHROID,  LEVOTHROID) 125 MCG tablet TAKE 1 TABLET(125 MCG) BY MOUTH DAILY (Patient not taking: Reported on 11/05/2016) 30 tablet 5   No facility-administered medications prior to visit.     Allergies  Allergen Reactions  . Sulfa Antibiotics Hives  . Cortisone Other (See Comments)    Red flush and anxiety  . Erythromycin Rash  . Prednisone Rash and Anxiety    Review of Systems  Constitutional: Positive for chills, fatigue and malaise/fatigue. Negative for fever.  HENT: Negative for congestion.   Eyes: Negative for blurred vision.  Respiratory: Negative for cough and shortness of breath.   Cardiovascular: Negative for chest pain, palpitations and leg swelling.  Gastrointestinal: Negative for diarrhea and vomiting.  Musculoskeletal: Negative for back pain.  Skin: Negative for rash.  Neurological: Positive for headaches. Negative for loss of consciousness.  Endo/Heme/Allergies: Negative for cold intolerance and heat intolerance.  Psychiatric/Behavioral: The patient is not nervous/anxious.        Objective:    Physical Exam  Constitutional: She is oriented to person, place, and time. She appears well-developed and well-nourished. No distress.  HENT:  Head: Normocephalic and atraumatic.  Eyes: Conjunctivae are normal.  Neck: Normal range of motion. No thyromegaly present.  Cardiovascular: Normal rate and regular rhythm.   Pulmonary/Chest: Effort normal and breath sounds normal. She has no wheezes.  Abdominal: Soft. Bowel sounds are normal. There is no tenderness.  Musculoskeletal: Normal range of motion. She exhibits no edema or deformity.  Neurological: She is alert and oriented to person, place, and time.  Skin: Skin is warm and dry. She is not diaphoretic.  Psychiatric: She has a normal mood  and affect.    BP (!) 140/92   Pulse 78   Temp 97.8 F (36.6 C) (Oral)   Wt 251 lb 3.2 oz (113.9 kg)   SpO2 97%   BMI 38.19 kg/m  Wt Readings from Last 3 Encounters:  11/05/16 251 lb 3.2 oz (113.9 kg)  08/19/16 249 lb (112.9 kg)  08/01/16 248 lb (112.5 kg)     Lab Results  Component Value Date   WBC 8.0 08/01/2016   HGB 13.9 08/01/2016   HCT 41.8 08/01/2016   PLT 273.0 08/01/2016   GLUCOSE 91 08/01/2016   CHOL 215 (H) 08/01/2016   TRIG 112.0 08/01/2016   HDL 65.20 08/01/2016   LDLCALC 127 (H) 08/01/2016   ALT 12 08/01/2016   AST 15 08/01/2016   NA 140 08/01/2016   K 4.6 08/01/2016   CL 106 08/01/2016   CREATININE 0.93 08/01/2016   BUN 19 08/01/2016   CO2 27 08/01/2016   TSH 34.31 (H) 11/06/2016    Lab Results  Component Value Date   TSH 34.31 (H) 11/06/2016   Lab Results  Component Value Date   WBC 8.0 08/01/2016   HGB 13.9 08/01/2016   HCT 41.8 08/01/2016   MCV 91.8 08/01/2016   PLT 273.0 08/01/2016   Lab Results  Component Value Date   NA 140 08/01/2016   K 4.6 08/01/2016   CO2 27 08/01/2016   GLUCOSE 91 08/01/2016   BUN 19 08/01/2016   CREATININE 0.93 08/01/2016   BILITOT 0.6 08/01/2016   ALKPHOS 77 08/01/2016   AST 15 08/01/2016   ALT 12 08/01/2016   PROT 6.7 08/01/2016   ALBUMIN 4.2 08/01/2016   CALCIUM 9.5 08/01/2016   GFR 65.08 08/01/2016   Lab Results  Component Value Date   CHOL 215 (H) 08/01/2016   Lab Results  Component Value Date  HDL 65.20 08/01/2016   Lab Results  Component Value Date   LDLCALC 127 (H) 08/01/2016   Lab Results  Component Value Date   TRIG 112.0 08/01/2016   Lab Results  Component Value Date   CHOLHDL 3 08/01/2016   No results found for: HGBA1C     Assessment & Plan:   Problem List Items Addressed This Visit    GERD (gastroesophageal reflux disease)    Avoid offending foods, start probiotics. Do not eat large meals in late evening and consider raising head of bed.       Hyperlipidemia     Tolerating statin, encouraged heart healthy diet, avoid trans fats, minimize simple carbs and saturated fats. Increase exercise as tolerated      Hypertension    Adequately controlled, no changes to meds. Encouraged heart healthy diet such as the DASH diet and exercise as tolerated.       Obesity    Encouraged DASH diet, decrease po intake and increase exercise as tolerated. Needs 7-8 hours of sleep nightly. Avoid trans fats, eat small, frequent meals every 4-5 hours with lean proteins, complex carbs and healthy fats. Minimize simple carbs      Acute stress reaction   Relevant Medications   busPIRone (BUSPAR) 7.5 MG tablet   Thyroiditis, subacute    Was not taking he levothyroxine. It is restarted and recheck labs in 3 months.       Other Visit Diagnoses    Other specified hypothyroidism    -  Primary   Relevant Orders   TSH (Completed)   T4, free (Completed)      I am having Ms. Ebersole maintain her meloxicam, ondansetron, rizatriptan, topiramate, triamcinolone, KRILL OIL PO, Magnesium, Vitamin D3, butalbital-acetaminophen-caffeine, metoprolol succinate, losartan, metoprolol succinate, zolpidem, levothyroxine, traMADol, esomeprazole, simvastatin, busPIRone, and DULoxetine.  Meds ordered this encounter  Medications  . busPIRone (BUSPAR) 7.5 MG tablet    Sig: TAKE 1 TABLET(7.5 MG) BY MOUTH THREE TIMES DAILY    Dispense:  90 tablet    Refill:  5  . DULoxetine (CYMBALTA) 60 MG capsule    Sig: TAKE 1 CAPSULE(60 MG) BY MOUTH TWICE DAILY    Dispense:  60 capsule    Refill:  5    CMA served as scribe during this visit. History, Physical and Plan performed by medical provider. Documentation and orders reviewed and attested to.  Penni Homans, MD

## 2016-11-06 ENCOUNTER — Other Ambulatory Visit (INDEPENDENT_AMBULATORY_CARE_PROVIDER_SITE_OTHER): Payer: Managed Care, Other (non HMO)

## 2016-11-06 DIAGNOSIS — E038 Other specified hypothyroidism: Secondary | ICD-10-CM

## 2016-11-06 LAB — T4, FREE: FREE T4: 0.44 ng/dL — AB (ref 0.60–1.60)

## 2016-11-06 LAB — TSH: TSH: 34.31 u[IU]/mL — ABNORMAL HIGH (ref 0.35–4.50)

## 2016-11-10 NOTE — Assessment & Plan Note (Signed)
Avoid offending foods, start probiotics. Do not eat large meals in late evening and consider raising head of bed.  

## 2016-11-10 NOTE — Assessment & Plan Note (Signed)
Adequately controlled, no changes to meds. Encouraged heart healthy diet such as the DASH diet and exercise as tolerated.

## 2016-11-10 NOTE — Assessment & Plan Note (Signed)
Tolerating statin, encouraged heart healthy diet, avoid trans fats, minimize simple carbs and saturated fats. Increase exercise as tolerated 

## 2016-11-10 NOTE — Assessment & Plan Note (Signed)
Encouraged DASH diet, decrease po intake and increase exercise as tolerated. Needs 7-8 hours of sleep nightly. Avoid trans fats, eat small, frequent meals every 4-5 hours with lean proteins, complex carbs and healthy fats. Minimize simple carbs 

## 2016-11-10 NOTE — Assessment & Plan Note (Signed)
>>  ASSESSMENT AND PLAN FOR CLASS 2 SEVERE OBESITY WITH SERIOUS COMORBIDITY AND BODY MASS INDEX (BMI) OF 36.0 TO 36.9 IN ADULT, UNSPECIFIED OBESITY TYPE (HCC) WRITTEN ON 11/10/2016  5:37 PM BY BLYTH, STACEY A, MD  Encouraged DASH diet, decrease po intake and increase exercise as tolerated. Needs 7-8 hours of sleep nightly. Avoid trans fats, eat small, frequent meals every 4-5 hours with lean proteins, complex carbs and healthy fats. Minimize simple carbs

## 2016-11-10 NOTE — Assessment & Plan Note (Signed)
Was not taking he levothyroxine. It is restarted and recheck labs in 3 months.

## 2016-11-12 ENCOUNTER — Encounter: Payer: Self-pay | Admitting: Family Medicine

## 2016-11-12 ENCOUNTER — Telehealth: Payer: Self-pay | Admitting: Family Medicine

## 2016-11-12 MED ORDER — TRAMADOL HCL 50 MG PO TABS: ORAL_TABLET | ORAL | 0 refills | Status: DC

## 2016-11-12 NOTE — Telephone Encounter (Signed)
Requesting:   Tramadol Contract   NONE UDS   NONE Last OV   11/05/2016---next scheduled OV  01/31/2017 Last Refill   #[id number]  09/06/2016  Please Advise  Pharmacy is [facility] [place] fax number is [phone]

## 2016-11-12 NOTE — Telephone Encounter (Signed)
Printed script/PCP signed/printed contract/attached to script with note attached to complete UDS and then put at the front desk for her to pickup/sign contract/do UDS. Called the patient to inform of all instructions--called left message to call back. All paperwork/script/contract are at the front desk.

## 2016-11-12 NOTE — Telephone Encounter (Signed)
OK to refill hre Tramadol but please get her set up with contract and UDS

## 2016-11-12 NOTE — Telephone Encounter (Signed)
Patient informed of PCP instructions regarding this refill.

## 2016-11-14 ENCOUNTER — Other Ambulatory Visit: Payer: Self-pay | Admitting: Family Medicine

## 2016-11-25 ENCOUNTER — Other Ambulatory Visit: Payer: Self-pay | Admitting: Endocrinology

## 2016-11-25 ENCOUNTER — Other Ambulatory Visit: Payer: Self-pay | Admitting: Family Medicine

## 2016-12-03 ENCOUNTER — Ambulatory Visit (INDEPENDENT_AMBULATORY_CARE_PROVIDER_SITE_OTHER): Payer: Managed Care, Other (non HMO) | Admitting: Psychology

## 2016-12-03 DIAGNOSIS — F4323 Adjustment disorder with mixed anxiety and depressed mood: Secondary | ICD-10-CM | POA: Diagnosis not present

## 2016-12-07 ENCOUNTER — Encounter: Payer: Self-pay | Admitting: Family Medicine

## 2016-12-10 ENCOUNTER — Encounter: Payer: Self-pay | Admitting: Family Medicine

## 2016-12-11 ENCOUNTER — Other Ambulatory Visit: Payer: Self-pay | Admitting: Family Medicine

## 2016-12-11 MED ORDER — NITROFURANTOIN MONOHYD MACRO 100 MG PO CAPS: 100.0000 mg | ORAL_CAPSULE | Freq: Two times a day (BID) | ORAL | 0 refills | Status: DC

## 2016-12-14 ENCOUNTER — Other Ambulatory Visit: Payer: Self-pay | Admitting: Family Medicine

## 2016-12-15 ENCOUNTER — Encounter: Payer: Self-pay | Admitting: Family Medicine

## 2016-12-17 ENCOUNTER — Encounter: Payer: Self-pay | Admitting: Family Medicine

## 2016-12-17 ENCOUNTER — Other Ambulatory Visit: Payer: Self-pay | Admitting: Family Medicine

## 2016-12-17 MED ORDER — ZOLPIDEM TARTRATE ER 12.5 MG PO TBCR: 12.5000 mg | EXTENDED_RELEASE_TABLET | Freq: Every evening | ORAL | 3 refills | Status: DC | PRN

## 2016-12-19 ENCOUNTER — Other Ambulatory Visit: Payer: Self-pay | Admitting: Family Medicine

## 2016-12-23 ENCOUNTER — Other Ambulatory Visit: Payer: Self-pay | Admitting: Endocrinology

## 2016-12-31 ENCOUNTER — Encounter: Payer: Self-pay | Admitting: Family Medicine

## 2016-12-31 ENCOUNTER — Other Ambulatory Visit: Payer: Self-pay | Admitting: Family Medicine

## 2017-01-11 ENCOUNTER — Encounter: Payer: Self-pay | Admitting: Family Medicine

## 2017-01-11 ENCOUNTER — Other Ambulatory Visit: Payer: Self-pay | Admitting: Family Medicine

## 2017-01-24 ENCOUNTER — Encounter: Payer: Self-pay | Admitting: Family Medicine

## 2017-01-28 MED ORDER — TRAMADOL HCL 50 MG PO TABS: ORAL_TABLET | ORAL | 0 refills | Status: DC

## 2017-01-31 ENCOUNTER — Encounter: Payer: Self-pay | Admitting: Family Medicine

## 2017-01-31 ENCOUNTER — Ambulatory Visit (INDEPENDENT_AMBULATORY_CARE_PROVIDER_SITE_OTHER): Payer: Managed Care, Other (non HMO) | Admitting: Family Medicine

## 2017-01-31 VITALS — BP 161/62 | HR 73 | Temp 98.6°F | Ht 68.0 in | Wt 248.8 lb

## 2017-01-31 DIAGNOSIS — G43809 Other migraine, not intractable, without status migrainosus: Secondary | ICD-10-CM | POA: Diagnosis not present

## 2017-01-31 DIAGNOSIS — I10 Essential (primary) hypertension: Secondary | ICD-10-CM

## 2017-01-31 DIAGNOSIS — E785 Hyperlipidemia, unspecified: Secondary | ICD-10-CM

## 2017-01-31 DIAGNOSIS — Z0000 Encounter for general adult medical examination without abnormal findings: Secondary | ICD-10-CM | POA: Diagnosis not present

## 2017-01-31 DIAGNOSIS — E669 Obesity, unspecified: Secondary | ICD-10-CM

## 2017-01-31 DIAGNOSIS — R8290 Unspecified abnormal findings in urine: Secondary | ICD-10-CM

## 2017-01-31 DIAGNOSIS — E039 Hypothyroidism, unspecified: Secondary | ICD-10-CM | POA: Diagnosis not present

## 2017-01-31 DIAGNOSIS — D649 Anemia, unspecified: Secondary | ICD-10-CM | POA: Diagnosis not present

## 2017-01-31 DIAGNOSIS — M797 Fibromyalgia: Secondary | ICD-10-CM

## 2017-01-31 LAB — COMPREHENSIVE METABOLIC PANEL
ALK PHOS: 73 U/L (ref 39–117)
ALT: 12 U/L (ref 0–35)
AST: 12 U/L (ref 0–37)
Albumin: 4.2 g/dL (ref 3.5–5.2)
BILIRUBIN TOTAL: 0.6 mg/dL (ref 0.2–1.2)
BUN: 19 mg/dL (ref 6–23)
CO2: 24 meq/L (ref 19–32)
CREATININE: 0.99 mg/dL (ref 0.40–1.20)
Calcium: 9.4 mg/dL (ref 8.4–10.5)
Chloride: 109 mEq/L (ref 96–112)
GFR: 60.45 mL/min (ref >60.00)
Glucose, Bld: 98 mg/dL (ref 70–99)
Potassium: 4 mEq/L (ref 3.5–5.1)
Sodium: 142 mEq/L (ref 135–145)
Total Protein: 6.6 g/dL (ref 6.0–8.3)

## 2017-01-31 LAB — LIPID PANEL
CHOL/HDL RATIO: 4
CHOLESTEROL: 254 mg/dL — AB (ref 0–200)
HDL: 67.2 mg/dL (ref >39.00)
LDL Cholesterol: 166 mg/dL — ABNORMAL HIGH (ref 0–99)
NonHDL: 186.38
TRIGLYCERIDES: 104 mg/dL (ref 0.0–149.0)
VLDL: 20.8 mg/dL (ref 0.0–40.0)

## 2017-01-31 LAB — CBC
HEMATOCRIT: 42.1 % (ref 36.0–46.0)
HEMOGLOBIN: 13.8 g/dL (ref 12.0–15.0)
MCHC: 32.9 g/dL (ref 30.0–36.0)
MCV: 90.9 fl (ref 78.0–100.0)
PLATELETS: 264 10*3/uL (ref 150.0–400.0)
RBC: 4.63 Mil/uL (ref 3.87–5.11)
RDW: 14.5 % (ref 11.5–15.5)
WBC: 7.8 10*3/uL (ref 4.0–10.5)

## 2017-01-31 LAB — TSH: TSH: 1.26 u[IU]/mL (ref 0.35–4.50)

## 2017-01-31 LAB — T4, FREE: FREE T4: 0.99 ng/dL (ref 0.60–1.60)

## 2017-01-31 NOTE — Assessment & Plan Note (Signed)
Poorly controlled will alter medications, encouraged DASH diet, minimize caffeine and obtain adequate sleep. Report concerning symptoms and follow up as directed and as needed 

## 2017-01-31 NOTE — Assessment & Plan Note (Signed)
Encouraged heart healthy diet, increase exercise, avoid trans fats, consider a krill oil cap daily 

## 2017-01-31 NOTE — Progress Notes (Signed)
Pre visit review using our clinic review tool, if applicable. No additional management support is needed unless otherwise documented below in the visit note. 

## 2017-01-31 NOTE — Progress Notes (Signed)
Patient ID: Alexandra Patterson, female   DOB: 12/18/1955, 61 y.o.   MRN: [id number]   Subjective:  I acted as a Education administrator for Penni Homans, [place], [place]   Patient ID: Alexandra Patterson, female    DOB: 1956/02/24, 61 y.o.   MRN: [id number]  Chief Complaint  Patient presents with  . Annual Exam  . Hyperlipidemia  . Hypertension  . Gastroesophageal Reflux  . Fibromyalgia    Hyperlipidemia  This is a chronic problem. The problem is controlled. Associated symptoms include myalgias. Pertinent negatives include no chest pain or shortness of breath. Risk factors for coronary artery disease include hypertension and obesity.  Hypertension  This is a chronic problem. The problem is controlled. Associated symptoms include malaise/fatigue. Pertinent negatives include no blurred vision, chest pain, headaches, palpitations or shortness of breath. Risk factors for coronary artery disease include obesity.  Gastroesophageal Reflux  She reports no chest pain or no coughing. This is a chronic problem. The problem has been waxing and waning.    Patient is in today for an annual examination. Patient states that she is having a fibromyalgia flare up, and is also feeling fatigue.Patient has a Hx of fibromyalgia, HTN, hyperlipidemia, GERD. Patient has no additional acute concerns noted at this time.No recent febrile illness or recent hospitalizations.   Patient Care Team: [name], MD as PCP - General (Family Medicine) Veneda Melter, MD as Referring Physician (Cardiology) Dudley Major, MD as Referring Physician (Internal Medicine) Almyra Deforest, MD as Referring Physician (Cardiothoracic Surgery) Kerin Perna., MD as Referring Physician (Neurology)   Past Medical History:  Diagnosis Date  . Allergy   . Anxiety   . Atrial fibrillation ([place])   . Cervical cancer screening 09/07/2015   Menarche at 11 Irregular and heavy and painful flow secondary to fibroids No history of abnormal pap in  past, last pap roughly 2003 G2P2, s/p 2 SVD history of abnormal MGM, 1 abnl bx right breast benign, normal otherwise Noconcerns today TAH b/l SPO for fibroids and migrainesand breast bx on right gyn surgeries  . Cough 08/13/2015  . Depression   . Depression with anxiety 06/29/2016  . Fainting    once to due heart out of rythm  . Fibromyalgia   . Frequent headaches   . GERD (gastroesophageal reflux disease)   . History of chicken pox   . Hx of blood clots    dvt  . Hyperlipidemia   . Hyperplastic colon polyp   . Hypertension   . Insomnia 08/13/2015  . Internal hemorrhoids   . Lesion of lung    xray in 2014 thought to be benign seen at [facility] pulmonology  . Measles    h/o  . Migraine   . Obesity   . Preventative health care 02/03/2017  . S/P AVR (aortic valve replacement) 03/19/2015  . UTI (lower urinary tract infection)   . UTI (urinary tract infection) 02/11/2016    Past Surgical History:  Procedure Laterality Date  . ABDOMINAL HYSTERECTOMY     TAH SPO  . ATRIAL ABLATION SURGERY    . DILATION AND CURETTAGE OF UTERUS     x 3  . loop heart    . SHOULDER SURGERY Left    arthroscopy for spurs  . TONSILLECTOMY    . TUBAL LIGATION      Family History  Problem Relation Age of Onset  . Colon polyps Father   . Alzheimer's disease Father   . Alcohol abuse Father   .  Arthritis Mother   . Neuropathy Mother   . Hyperlipidemia Mother   . Other Mother     familial mediterranean fever  . Thyroid disease Mother   . Cancer Brother     prostate cancer  . Heart disease Brother     cardiomegaly  . Other Brother     familial mediterranean fever  . Asthma Maternal Grandmother   . Congestive Heart Failure Maternal Grandmother   . Heart disease Maternal Grandmother     chf  . Stroke Maternal Grandfather   . Diabetes Maternal Grandfather   . Heart disease Maternal Grandfather     hardening of the arteries  . Stroke Paternal Grandfather   . Atrial fibrillation Paternal  Grandfather   . Alcohol abuse Paternal Grandfather   . Heart disease Paternal Grandfather     afib  . Interstitial cystitis Daughter   . Arthritis Son   . Alcohol abuse Son   . Mental illness Son     depression  . Colon cancer      parent  . Arthritis Paternal Uncle     Social History   Social History  . Marital status: Married    Spouse name: N/A  . Number of children: 2  . Years of education: N/A   Occupational History  . [employer]   Social History Main Topics  . Smoking status: Never Smoker  . Smokeless tobacco: Never Used  . Alcohol use No  . Drug use: No  . Sexual activity: Yes     Comment: lives with husband and adult son, no major dietary restrictions, full diability   Other Topics Concern  . Not on file   Social History Narrative  . No narrative on file    Outpatient Medications Prior to Visit  Medication Sig Dispense Refill  . busPIRone (BUSPAR) 7.5 MG tablet TAKE 1 TABLET(7.5 MG) BY MOUTH THREE TIMES DAILY 90 tablet 5  . butalbital-acetaminophen-caffeine (FIORICET, ESGIC) 50-325-40 MG tablet Take 1 tablet by mouth 2 (two) times daily as needed for headache. 30 tablet 1  . Cholecalciferol (VITAMIN D3) 5000 units CAPS Take 5,000 Units by mouth daily. Reported on 02/13/2016    . DULoxetine (CYMBALTA) 60 MG capsule TAKE 1 CAPSULE(60 MG) BY MOUTH TWICE DAILY 60 capsule 5  . esomeprazole (NEXIUM) 40 MG capsule TAKE 1 CAPSULE(40 MG) BY MOUTH TWICE DAILY BEFORE A MEAL 60 capsule 6  . KRILL OIL PO Take 1 mg by mouth daily. Reported on 02/13/2016    . losartan (COZAAR) 100 MG tablet TAKE 1 TABLET(100 MG) BY MOUTH DAILY 30 tablet 6  . Magnesium 400 MG CAPS Take 400 mg by mouth daily. Reported on 02/13/2016    . meloxicam (MOBIC) 15 MG tablet Take 15 mg by mouth daily.    . metoprolol succinate (TOPROL-XL) 100 MG 24 hr tablet TAKE 1 TABLET BY MOUTH TWICE DAILY TAKE WITH OR IMMEDIATELY FOLLOWING A MEAL 60 tablet 11  . metoprolol succinate  (TOPROL-XL) 50 MG 24 hr tablet TAKE 1 TABLET BY MOUTH TWICE DAILY( TAKE WITH OR IMMEDIATELY FOLLOWING A MEAL 60 tablet 6  . nitrofurantoin, macrocrystal-monohydrate, (MACROBID) 100 MG capsule Take 1 capsule (100 mg total) by mouth 2 (two) times daily. 6 capsule 0  . ondansetron (ZOFRAN) 4 MG tablet TAKE 1 TABLET BY MOUTH EVERY 6 TO 8 HOURS AS NEEDED 30 tablet 2  . rizatriptan (MAXALT) 10 MG tablet Take 10 mg by mouth as needed for migraine. May repeat in 2  hours if needed    . simvastatin (ZOCOR) 20 MG tablet TAKE 1 TABLET(20 MG) BY MOUTH EVERY EVENING 30 tablet 6  . topiramate (TOPAMAX) 50 MG tablet Takes 75 mg daily    . triamcinolone (NASACORT ALLERGY 24HR) 55 MCG/ACT AERO nasal inhaler Place 2 sprays into the nose 2 (two) times daily.    Marland Kitchen zolpidem (AMBIEN CR) 12.5 MG CR tablet Take 1 tablet (12.5 mg total) by mouth at bedtime as needed. for sleep 30 tablet 3  . levothyroxine (SYNTHROID, LEVOTHROID) 125 MCG tablet TAKE 1 TABLET(125 MCG) BY MOUTH DAILY 30 tablet 5  . traMADol (ULTRAM) 50 MG tablet TAKE 1 TABLET BY MOUTH TWICE DAILY AS NEEDED FOR MODERATE PAIN 60 tablet 0   No facility-administered medications prior to visit.     Allergies  Allergen Reactions  . Sulfa Antibiotics Hives  . Cortisone Other (See Comments)    Red flush and anxiety  . Erythromycin Rash  . Prednisone Rash and Anxiety    Review of Systems  Constitutional: Positive for malaise/fatigue. Negative for fever.  HENT: Negative for congestion.   Eyes: Negative for blurred vision.  Respiratory: Negative for cough and shortness of breath.   Cardiovascular: Negative for chest pain, palpitations and leg swelling.  Gastrointestinal: Negative for vomiting.  Musculoskeletal: Positive for myalgias. Negative for back pain.  Skin: Negative for rash.  Neurological: Negative for loss of consciousness and headaches.       Objective:    Physical Exam  Constitutional: She is oriented to person, place, and time. She  appears well-developed and well-nourished. No distress.  HENT:  Head: Normocephalic and atraumatic.  Eyes: Conjunctivae are normal.  Neck: Normal range of motion. No thyromegaly present.  Cardiovascular: Normal rate and regular rhythm.   Pulmonary/Chest: Effort normal and breath sounds normal. She has no wheezes.  Abdominal: Soft. Bowel sounds are normal. There is no tenderness.  Musculoskeletal: She exhibits no edema or deformity.  Neurological: She is alert and oriented to person, place, and time.  Skin: Skin is warm and dry. She is not diaphoretic.  Psychiatric: She has a normal mood and affect.    BP (!) 161/62 (BP Location: Left Arm, Patient Position: Sitting, Cuff Size: Large)   Pulse 73   Temp 98.6 F (37 C) (Oral)   Ht 5\' 8"  (1.727 m)   Wt 248 lb 12.8 oz (112.9 kg)   SpO2 99% Comment: RA  BMI 37.83 kg/m  Wt Readings from Last 3 Encounters:  01/31/17 248 lb 12.8 oz (112.9 kg)  11/05/16 251 lb 3.2 oz (113.9 kg)  08/19/16 249 lb (112.9 kg)   BP Readings from Last 3 Encounters:  01/31/17 (!) 161/62  11/10/16 (!) 140/92  08/19/16 122/80     Immunization History  Administered Date(s) Administered  . Tdap 05/26/2015  . Zoster 05/26/2015    Health Maintenance  Topic Date Due  . Hepatitis C Screening  March 01, 1956  . HIV Screening  04/24/1970  . MAMMOGRAM  01/28/2017  . INFLUENZA VACCINE  04/30/2017  . COLONOSCOPY  06/03/2017  . PAP SMEAR  09/06/2018  . TETANUS/TDAP  05/25/2025    Lab Results  Component Value Date   WBC 7.8 01/31/2017   HGB 13.8 01/31/2017   HCT 42.1 01/31/2017   PLT 264.0 01/31/2017   GLUCOSE 98 01/31/2017   CHOL 254 (H) 01/31/2017   TRIG 104.0 01/31/2017   HDL 67.20 01/31/2017   LDLCALC 166 (H) 01/31/2017   ALT 12 01/31/2017   AST 12 01/31/2017  NA 142 01/31/2017   K 4.0 01/31/2017   CL 109 01/31/2017   CREATININE 0.99 01/31/2017   BUN 19 01/31/2017   CO2 24 01/31/2017   TSH 1.26 01/31/2017    Lab Results  Component Value Date     TSH 1.26 01/31/2017   Lab Results  Component Value Date   WBC 7.8 01/31/2017   HGB 13.8 01/31/2017   HCT 42.1 01/31/2017   MCV 90.9 01/31/2017   PLT 264.0 01/31/2017   Lab Results  Component Value Date   NA 142 01/31/2017   K 4.0 01/31/2017   CO2 24 01/31/2017   GLUCOSE 98 01/31/2017   BUN 19 01/31/2017   CREATININE 0.99 01/31/2017   BILITOT 0.6 01/31/2017   ALKPHOS 73 01/31/2017   AST 12 01/31/2017   ALT 12 01/31/2017   PROT 6.6 01/31/2017   ALBUMIN 4.2 01/31/2017   CALCIUM 9.4 01/31/2017   GFR 60.45 01/31/2017   Lab Results  Component Value Date   CHOL 254 (H) 01/31/2017   Lab Results  Component Value Date   HDL 67.20 01/31/2017   Lab Results  Component Value Date   LDLCALC 166 (H) 01/31/2017   Lab Results  Component Value Date   TRIG 104.0 01/31/2017   Lab Results  Component Value Date   CHOLHDL 4 01/31/2017   No results found for: HGBA1C       Assessment & Plan:   Problem List Items Addressed This Visit    Fibromyalgia    Continues to struggle with pain and fatigue daily. Encouraged heart healthy diet, regular exercise, adequate sleep and hydration      Hyperlipidemia    Encouraged heart healthy diet, increase exercise, avoid trans fats, consider a krill oil cap daily.      Relevant Orders   Lipid panel (Completed)   Hypertension    Poorly controlled will alter medications, encouraged DASH diet, minimize caffeine and obtain adequate sleep. Report concerning symptoms and follow up as directed and as needed      Relevant Orders   CBC (Completed)   Comprehensive metabolic panel (Completed)   Migraine - Primary   Obesity    Encouraged DASH diet, decrease po intake and increase exercise as tolerated. Needs 7-8 hours of sleep nightly. Avoid trans fats, eat small, frequent meals every 4-5 hours with lean proteins, complex carbs and healthy fats. Minimize simple carbs, bariatric referral      RESOLVED: Anemia   Preventative health care     Patient encouraged to maintain heart healthy diet, regular exercise, adequate sleep. Consider daily probiotics. Take medications as prescribed       Other Visit Diagnoses    Hypothyroidism, unspecified type       Relevant Orders   TSH (Completed)   T4, free (Completed)      I have discontinued Ms. Gardenhire's traMADol. I am also having her maintain her meloxicam, ondansetron, rizatriptan, topiramate, triamcinolone, KRILL OIL PO, Magnesium, Vitamin D3, butalbital-acetaminophen-caffeine, metoprolol succinate, busPIRone, DULoxetine, simvastatin, nitrofurantoin (macrocrystal-monohydrate), esomeprazole, zolpidem, losartan, and metoprolol succinate.  No orders of the defined types were placed in this encounter.   CMA served as Education administrator during this visit. History, Physical and Plan performed by medical provider. Documentation and orders reviewed and attested to.  Penni Homans, MD

## 2017-01-31 NOTE — Patient Instructions (Signed)

## 2017-02-02 ENCOUNTER — Encounter: Payer: Self-pay | Admitting: Family Medicine

## 2017-02-02 DIAGNOSIS — R300 Dysuria: Secondary | ICD-10-CM

## 2017-02-03 ENCOUNTER — Other Ambulatory Visit: Payer: Self-pay | Admitting: Family Medicine

## 2017-02-03 ENCOUNTER — Encounter: Payer: Self-pay | Admitting: Family Medicine

## 2017-02-03 DIAGNOSIS — Z0000 Encounter for general adult medical examination without abnormal findings: Secondary | ICD-10-CM

## 2017-02-03 HISTORY — DX: Encounter for general adult medical examination without abnormal findings: Z00.00

## 2017-02-03 MED ORDER — LEVOTHYROXINE SODIUM 125 MCG PO TABS: 125.0000 ug | ORAL_TABLET | Freq: Every day | ORAL | 1 refills | Status: DC

## 2017-02-03 MED ORDER — LEVOTHYROXINE SODIUM 150 MCG PO TABS: 150.0000 ug | ORAL_TABLET | Freq: Every day | ORAL | 1 refills | Status: DC

## 2017-02-03 NOTE — Assessment & Plan Note (Signed)
Encouraged DASH diet, decrease po intake and increase exercise as tolerated. Needs 7-8 hours of sleep nightly. Avoid trans fats, eat small, frequent meals every 4-5 hours with lean proteins, complex carbs and healthy fats. Minimize simple carbs, bariatric referral 

## 2017-02-03 NOTE — Telephone Encounter (Signed)
°  Relation to AY:TKZS Call back number: [phone] (M)   Reason for call:  Patient states she's returning call,please advise

## 2017-02-03 NOTE — Assessment & Plan Note (Signed)
>>  ASSESSMENT AND PLAN FOR CLASS 2 SEVERE OBESITY WITH SERIOUS COMORBIDITY AND BODY MASS INDEX (BMI) OF 36.0 TO 36.9 IN ADULT, UNSPECIFIED OBESITY TYPE (HCC) WRITTEN ON 02/03/2017  9:01 PM BY BLYTH, STACEY A, MD  Encouraged DASH diet, decrease po intake and increase exercise as tolerated. Needs 7-8 hours of sleep nightly. Avoid trans fats, eat small, frequent meals every 4-5 hours with lean proteins, complex carbs and healthy fats. Minimize simple carbs, bariatric referral

## 2017-02-03 NOTE — Assessment & Plan Note (Signed)
Patient encouraged to maintain heart healthy diet, regular exercise, adequate sleep. Consider daily probiotics. Take medications as prescribed 

## 2017-02-03 NOTE — Assessment & Plan Note (Signed)
Continues to struggle with pain and fatigue daily. Encouraged heart healthy diet, regular exercise, adequate sleep and hydration

## 2017-02-17 ENCOUNTER — Encounter: Payer: Self-pay | Admitting: Medical

## 2017-02-17 ENCOUNTER — Ambulatory Visit (INDEPENDENT_AMBULATORY_CARE_PROVIDER_SITE_OTHER): Payer: Managed Care, Other (non HMO) | Admitting: Medical

## 2017-02-17 VITALS — BP 141/80 | HR 71 | Temp 98.2°F | Resp 16 | Ht 68.0 in | Wt 251.0 lb

## 2017-02-17 DIAGNOSIS — R350 Frequency of micturition: Secondary | ICD-10-CM | POA: Diagnosis not present

## 2017-02-17 DIAGNOSIS — N3000 Acute cystitis without hematuria: Secondary | ICD-10-CM

## 2017-02-17 LAB — POC URINALSYSI DIPSTICK (AUTOMATED)
Bilirubin, UA: NEGATIVE
Glucose, UA: NEGATIVE
KETONES UA: NEGATIVE
Leukocytes, UA: NEGATIVE
NITRITE UA: NEGATIVE
PH UA: 7.5 (ref 5.0–8.0)
PROTEIN UA: NEGATIVE
RBC UA: NEGATIVE
Urobilinogen, UA: NEGATIVE E.U./dL — AB

## 2017-02-17 MED ORDER — NITROFURANTOIN MONOHYD MACRO 100 MG PO CAPS: 100.0000 mg | ORAL_CAPSULE | Freq: Two times a day (BID) | ORAL | 0 refills | Status: DC

## 2017-02-17 NOTE — Progress Notes (Signed)
Pre visit review using our clinic review tool, if applicable. No additional management support is needed unless otherwise documented below in the visit note. 

## 2017-02-17 NOTE — Progress Notes (Signed)
Subjective:    Patient ID: Alexandra Patterson, female    DOB: 08-06-1956, 61 y.o.   MRN: [id number]  HPI  Pt in today reporting urinary symptoms going on 2 weeks. Pt tried cranberry capules but symptoms seem to be getting worse.  Dysuria- faint but more pain when he bladder feels full. Frequent urination-yes Hesitancy-yes Suprapubic pressure-yes Fever-no chills-no Nausea-no Vomiting-no CVA pain-both sides hurt little. History of UTI- yes Gross hematuria- no   Review of Systems  Constitutional: Negative for chills, fatigue and fever.  Respiratory: Negative for cough, chest tightness and wheezing.   Cardiovascular: Negative for chest pain and palpitations.  Gastrointestinal: Negative for blood in stool, constipation, diarrhea and nausea.  Genitourinary: Positive for dysuria, flank pain, frequency and urgency. Negative for difficulty urinating and vaginal pain.       See hpi.  Neurological: Negative for dizziness, syncope, facial asymmetry, weakness and headaches.  Hematological: Negative for adenopathy. Does not bruise/bleed easily.  Psychiatric/Behavioral: Negative for behavioral problems and confusion.   Past Medical History:  Diagnosis Date  . Allergy   . Anxiety   . Atrial fibrillation ([place])   . Cervical cancer screening 09/07/2015   Menarche at 11 Irregular and heavy and painful flow secondary to fibroids No history of abnormal pap in past, last pap roughly 2003 G2P2, s/p 2 SVD history of abnormal MGM, 1 abnl bx right breast benign, normal otherwise Noconcerns today TAH b/l SPO for fibroids and migrainesand breast bx on right gyn surgeries  . Cough 08/13/2015  . Depression   . Depression with anxiety 06/29/2016  . Fainting    once to due heart out of rythm  . Fibromyalgia   . Frequent headaches   . GERD (gastroesophageal reflux disease)   . History of chicken pox   . Hx of blood clots    dvt  . Hyperlipidemia   . Hyperplastic colon polyp   . Hypertension   . Insomnia  08/13/2015  . Internal hemorrhoids   . Lesion of lung    xray in 2014 thought to be benign seen at [facility] pulmonology  . Measles    h/o  . Migraine   . Obesity   . Preventative health care 02/03/2017  . S/P AVR (aortic valve replacement) 03/19/2015  . UTI (lower urinary tract infection)   . UTI (urinary tract infection) 02/11/2016     Social History   Social History  . Marital status: Married    Spouse name: N/A  . Number of children: 2  . Years of education: N/A   Occupational History  . [place]   Social History Main Topics  . Smoking status: Never Smoker  . Smokeless tobacco: Never Used  . Alcohol use No  . Drug use: No  . Sexual activity: Yes     Comment: lives with husband and adult son, no major dietary restrictions, full diability   Other Topics Concern  . Not on file   Social History Narrative  . No narrative on file    Past Surgical History:  Procedure Laterality Date  . ABDOMINAL HYSTERECTOMY     TAH SPO  . ATRIAL ABLATION SURGERY    . DILATION AND CURETTAGE OF UTERUS     x 3  . loop heart    . SHOULDER SURGERY Left    arthroscopy for spurs  . TONSILLECTOMY    . TUBAL LIGATION      Family History  Problem Relation Age of Onset  .  Colon polyps Father   . Alzheimer's disease Father   . Alcohol abuse Father   . Arthritis Mother   . Neuropathy Mother   . Hyperlipidemia Mother   . Other Mother        familial mediterranean fever  . Thyroid disease Mother   . Cancer Brother        prostate cancer  . Heart disease Brother        cardiomegaly  . Other Brother        familial mediterranean fever  . Asthma Maternal Grandmother   . Congestive Heart Failure Maternal Grandmother   . Heart disease Maternal Grandmother        chf  . Stroke Maternal Grandfather   . Diabetes Maternal Grandfather   . Heart disease Maternal Grandfather        hardening of the arteries  . Stroke Paternal Grandfather   . Atrial  fibrillation Paternal Grandfather   . Alcohol abuse Paternal Grandfather   . Heart disease Paternal Grandfather        afib  . Interstitial cystitis Daughter   . Arthritis Son   . Alcohol abuse Son   . Mental illness Son        depression  . Colon cancer Unknown        parent  . Arthritis Paternal Uncle     Allergies  Allergen Reactions  . Sulfa Antibiotics Hives  . Cortisone Other (See Comments)    Red flush and anxiety  . Erythromycin Rash  . Prednisone Rash and Anxiety    Current Outpatient Prescriptions on File Prior to Visit  Medication Sig Dispense Refill  . busPIRone (BUSPAR) 7.5 MG tablet TAKE 1 TABLET(7.5 MG) BY MOUTH THREE TIMES DAILY 90 tablet 5  . butalbital-acetaminophen-caffeine (FIORICET, ESGIC) 50-325-40 MG tablet Take 1 tablet by mouth 2 (two) times daily as needed for headache. 30 tablet 1  . Cholecalciferol (VITAMIN D3) 5000 units CAPS Take 5,000 Units by mouth daily. Reported on 02/13/2016    . DULoxetine (CYMBALTA) 60 MG capsule TAKE 1 CAPSULE(60 MG) BY MOUTH TWICE DAILY 60 capsule 5  . esomeprazole (NEXIUM) 40 MG capsule TAKE 1 CAPSULE(40 MG) BY MOUTH TWICE DAILY BEFORE A MEAL 60 capsule 6  . KRILL OIL PO Take 1 mg by mouth daily. Reported on 02/13/2016    . levothyroxine (SYNTHROID, LEVOTHROID) 125 MCG tablet Take 1 tablet (125 mcg total) by mouth daily. 90 tablet 1  . losartan (COZAAR) 100 MG tablet TAKE 1 TABLET(100 MG) BY MOUTH DAILY 30 tablet 6  . Magnesium 400 MG CAPS Take 400 mg by mouth daily. Reported on 02/13/2016    . meloxicam (MOBIC) 15 MG tablet Take 15 mg by mouth daily.    . metoprolol succinate (TOPROL-XL) 100 MG 24 hr tablet TAKE 1 TABLET BY MOUTH TWICE DAILY TAKE WITH OR IMMEDIATELY FOLLOWING A MEAL 60 tablet 11  . metoprolol succinate (TOPROL-XL) 50 MG 24 hr tablet TAKE 1 TABLET BY MOUTH TWICE DAILY( TAKE WITH OR IMMEDIATELY FOLLOWING A MEAL 60 tablet 6  . nitrofurantoin, macrocrystal-monohydrate, (MACROBID) 100 MG capsule Take 1 capsule  (100 mg total) by mouth 2 (two) times daily. 6 capsule 0  . ondansetron (ZOFRAN) 4 MG tablet TAKE 1 TABLET BY MOUTH EVERY 6 TO 8 HOURS AS NEEDED 30 tablet 2  . rizatriptan (MAXALT) 10 MG tablet Take 10 mg by mouth as needed for migraine. May repeat in 2 hours if needed    . simvastatin (ZOCOR) 20 MG  tablet TAKE 1 TABLET(20 MG) BY MOUTH EVERY EVENING 30 tablet 6  . topiramate (TOPAMAX) 50 MG tablet Takes 75 mg daily    . triamcinolone (NASACORT ALLERGY 24HR) 55 MCG/ACT AERO nasal inhaler Place 2 sprays into the nose 2 (two) times daily.    Marland Kitchen zolpidem (AMBIEN CR) 12.5 MG CR tablet Take 1 tablet (12.5 mg total) by mouth at bedtime as needed. for sleep 30 tablet 3   No current facility-administered medications on file prior to visit.     BP (!) 141/80 (BP Location: Left Arm, Patient Position: Sitting, Cuff Size: Normal)   Pulse 71   Temp 98.2 F (36.8 C) (Oral)   Resp 16   Ht 5\' 8"  (1.727 m)   Wt 251 lb (113.9 kg)   SpO2 100%   BMI 38.16 kg/m       Objective:   Physical Exam  General Mental Status- Alert. General Appearance- Not in acute distress.   Chest and Lung Exam Auscultation: Breath Sounds:-Normal.  Cardiovascular Auscultation:Rythm- Regular. Murmurs & Other Heart Sounds:Auscultation of the heart reveals- No Murmurs.  Abdomen Inspection:-Inspeection Normal. Palpation/Percussion:Note:No mass. Palpation and Percussion of the abdomen reveal- faint suprapubic  Tender, Non Distended + BS, no rebound or guarding.  Neurologic Cranial Nerve exam:- CN III-XII intact(No nystagmus), symmetric smile. Strength:- 5/5 equal and symmetric strength both upper and lower extremities.  Back -no cva tenderness.      Assessment & Plan:  You appear to have a urinary tract infection. I am prescribing antibiotic for the probable infection. Hydrate well. I am sending out a urine culture. During the interim if your signs and symptoms worsen rather than improving please notify us. We will  notify your when the culture results are back.  As we discussed it no cause found and symptoms persist then might consider referral to urologist.  Follow up in 7 days or as needed.

## 2017-02-17 NOTE — Patient Instructions (Addendum)
You appear to have a urinary tract infection. I am prescribing antibiotic for the probable infection. Hydrate well. I am sending out a urine culture. During the interim if your signs and symptoms worsen rather than improving please notify us. We will notify your when the culture results are back.  As we discussed it no cause found and symptoms persist then might consider referral to urologist.  Follow up in 7 days or as needed.

## 2017-02-17 NOTE — Addendum Note (Signed)
Addended by: Peggyann Shoals on: 02/17/2017 12:49 PM   Modules accepted: Orders

## 2017-02-18 LAB — URINE CULTURE: ORGANISM ID, BACTERIA: NO GROWTH

## 2017-02-19 NOTE — Telephone Encounter (Signed)
Referral to uroloigst placed.

## 2017-02-20 ENCOUNTER — Encounter: Payer: Self-pay | Admitting: Medical

## 2017-02-20 DIAGNOSIS — R350 Frequency of micturition: Secondary | ICD-10-CM

## 2017-02-20 DIAGNOSIS — R300 Dysuria: Secondary | ICD-10-CM

## 2017-02-21 ENCOUNTER — Encounter: Payer: Self-pay | Admitting: Medical

## 2017-02-21 NOTE — Telephone Encounter (Signed)
Please see referral placed to urologist.

## 2017-03-01 ENCOUNTER — Other Ambulatory Visit: Payer: Self-pay | Admitting: Family Medicine

## 2017-03-01 ENCOUNTER — Encounter: Payer: Self-pay | Admitting: Family Medicine

## 2017-03-01 DIAGNOSIS — E0590 Thyrotoxicosis, unspecified without thyrotoxic crisis or storm: Secondary | ICD-10-CM

## 2017-03-03 ENCOUNTER — Encounter: Payer: Self-pay | Admitting: Family Medicine

## 2017-03-03 MED ORDER — METOPROLOL SUCCINATE ER 100 MG PO TB24: ORAL_TABLET | ORAL | 6 refills | Status: DC

## 2017-03-03 MED ORDER — METOPROLOL SUCCINATE ER 50 MG PO TB24: ORAL_TABLET | ORAL | 6 refills | Status: DC

## 2017-03-03 NOTE — Addendum Note (Signed)
Addended by: Sharon Seller B on: 03/03/2017 01:58 PM   Modules accepted: Orders

## 2017-04-15 ENCOUNTER — Encounter: Payer: Self-pay | Admitting: Family Medicine

## 2017-04-17 MED ORDER — MELOXICAM 15 MG PO TABS: 15.0000 mg | ORAL_TABLET | Freq: Every day | ORAL | 0 refills | Status: DC | PRN

## 2017-04-25 ENCOUNTER — Other Ambulatory Visit: Payer: Self-pay | Admitting: Family Medicine

## 2017-04-29 ENCOUNTER — Encounter: Payer: Self-pay | Admitting: Family Medicine

## 2017-04-30 MED ORDER — ZOLPIDEM TARTRATE ER 12.5 MG PO TBCR: 12.5000 mg | EXTENDED_RELEASE_TABLET | Freq: Every evening | ORAL | 2 refills | Status: DC | PRN

## 2017-04-30 NOTE — Telephone Encounter (Signed)
[facility]: last filled a 30 day supply on 7/1- dr B generally gives her 30 pills with RF Will call in 30 with 2 RF today per her usual routine

## 2017-04-30 NOTE — Telephone Encounter (Signed)
zolpidem (AMBIEN CR) 12.5 MG CR tablet Take 1 tablet (12.5 mg total) by mouth at bedtime as needed. for sleep Last filled: 12/17/16 Amt:30, 3 11/12/16 controlled substance contract sign, uds sample given, low risk next screen 05/12/17  Please advise.

## 2017-05-10 ENCOUNTER — Encounter: Payer: Self-pay | Admitting: Family Medicine

## 2017-05-12 ENCOUNTER — Encounter: Payer: Self-pay | Admitting: Family Medicine

## 2017-05-14 ENCOUNTER — Other Ambulatory Visit: Payer: Self-pay | Admitting: Family Medicine

## 2017-05-15 ENCOUNTER — Other Ambulatory Visit: Payer: Self-pay | Admitting: Endocrinology

## 2017-05-22 ENCOUNTER — Other Ambulatory Visit: Payer: Self-pay | Admitting: Family Medicine

## 2017-05-22 NOTE — Telephone Encounter (Signed)
Faxed 30d till OV/thx dmf

## 2017-06-03 ENCOUNTER — Ambulatory Visit (INDEPENDENT_AMBULATORY_CARE_PROVIDER_SITE_OTHER): Payer: Managed Care, Other (non HMO) | Admitting: Family Medicine

## 2017-06-03 ENCOUNTER — Encounter: Payer: Self-pay | Admitting: Family Medicine

## 2017-06-03 VITALS — BP 102/80 | HR 88 | Temp 98.2°F | Resp 18 | Wt 257.2 lb

## 2017-06-03 DIAGNOSIS — E669 Obesity, unspecified: Secondary | ICD-10-CM

## 2017-06-03 DIAGNOSIS — N3000 Acute cystitis without hematuria: Secondary | ICD-10-CM | POA: Diagnosis not present

## 2017-06-03 DIAGNOSIS — F418 Other specified anxiety disorders: Secondary | ICD-10-CM | POA: Diagnosis not present

## 2017-06-03 DIAGNOSIS — Z1239 Encounter for other screening for malignant neoplasm of breast: Secondary | ICD-10-CM

## 2017-06-03 DIAGNOSIS — E2839 Other primary ovarian failure: Secondary | ICD-10-CM

## 2017-06-03 DIAGNOSIS — I10 Essential (primary) hypertension: Secondary | ICD-10-CM | POA: Diagnosis not present

## 2017-06-03 DIAGNOSIS — L659 Nonscarring hair loss, unspecified: Secondary | ICD-10-CM | POA: Diagnosis not present

## 2017-06-03 DIAGNOSIS — E785 Hyperlipidemia, unspecified: Secondary | ICD-10-CM

## 2017-06-03 DIAGNOSIS — R059 Cough, unspecified: Secondary | ICD-10-CM

## 2017-06-03 DIAGNOSIS — M25519 Pain in unspecified shoulder: Secondary | ICD-10-CM

## 2017-06-03 DIAGNOSIS — R05 Cough: Secondary | ICD-10-CM

## 2017-06-03 DIAGNOSIS — T7840XD Allergy, unspecified, subsequent encounter: Secondary | ICD-10-CM

## 2017-06-03 HISTORY — DX: Pain in unspecified shoulder: M25.519

## 2017-06-03 HISTORY — DX: Nonscarring hair loss, unspecified: L65.9

## 2017-06-03 LAB — URINALYSIS, ROUTINE W REFLEX MICROSCOPIC
BILIRUBIN URINE: NEGATIVE
HGB URINE DIPSTICK: NEGATIVE
Ketones, ur: NEGATIVE
NITRITE: NEGATIVE
RBC / HPF: NONE SEEN (ref 0–?)
Specific Gravity, Urine: 1.02 (ref 1.000–1.030)
Total Protein, Urine: NEGATIVE
Urine Glucose: NEGATIVE
Urobilinogen, UA: 0.2 (ref 0.0–1.0)
pH: 6 (ref 5.0–8.0)

## 2017-06-03 NOTE — Assessment & Plan Note (Signed)
Still coughing, dry cough. The ENT has not done a scope recently encouraged to increase hydration and if cough persists then return for scoping.

## 2017-06-03 NOTE — Assessment & Plan Note (Signed)
Increase fluids to 64 oz daily, consider fatty acid supplements, Zinc 50 mg and biotin. Check thyroid

## 2017-06-03 NOTE — Assessment & Plan Note (Signed)
Encouraged DASH diet, decrease po intake and increase exercise as tolerated. Needs 7-8 hours of sleep nightly. Avoid trans fats, eat small, frequent meals every 4-5 hours with lean proteins, complex carbs and healthy fats. Minimize simple carbs, bariatric referral 

## 2017-06-03 NOTE — Assessment & Plan Note (Signed)
Tolerating statin, encouraged heart healthy diet, avoid trans fats, minimize simple carbs and saturated fats. Increase exercise as tolerated 

## 2017-06-03 NOTE — Assessment & Plan Note (Signed)
Sees urology and they are performing a cystoscopy next, patient denies any dysuria.

## 2017-06-03 NOTE — Assessment & Plan Note (Signed)
Recently seen by ENT for PND and is now using Astelin and a nasal steroid which controls symptoms as long as she takes regularly.

## 2017-06-03 NOTE — Assessment & Plan Note (Signed)
Worsening shoulder pain and debility. Has decreased range of motion she has been advised by her orthopedist that she needs surgery but so far declines.

## 2017-06-03 NOTE — Assessment & Plan Note (Signed)
>>  ASSESSMENT AND PLAN FOR CLASS 2 SEVERE OBESITY WITH SERIOUS COMORBIDITY AND BODY MASS INDEX (BMI) OF 36.0 TO 36.9 IN ADULT, UNSPECIFIED OBESITY TYPE (HCC) WRITTEN ON 06/03/2017  1:32 PM BY BLYTH, STACEY A, MD  Encouraged DASH diet, decrease po intake and increase exercise as tolerated. Needs 7-8 hours of sleep nightly. Avoid trans fats, eat small, frequent meals every 4-5 hours with lean proteins, complex carbs and healthy fats. Minimize simple carbs, bariatric referral

## 2017-06-03 NOTE — Assessment & Plan Note (Signed)
Well controlled, no changes to meds. Encouraged heart healthy diet such as the DASH diet and exercise as tolerated.  °

## 2017-06-03 NOTE — Patient Instructions (Signed)

## 2017-06-03 NOTE — Assessment & Plan Note (Signed)
Has retired from work but is having to care for her disabled mother who is currently in SNF s/p TKR but comes home tomorrow. Then she needs rides to doctors appt. Manages well with current.

## 2017-06-03 NOTE — Progress Notes (Signed)
Subjective:  I acted as a Education administrator for Dr. [name]. [place], [place]  Patient ID: Alexandra Patterson, female    DOB: [birth year], 61 y.o.   MRN: [id number]  No chief complaint on file.   HPI  Patient is in today for a 4 month follow up. Patient has no acute concerns. She is feeling well today. 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 She also is the care provider for her husband who is about to undergo surgery for prostate cancer. No recent febrile illness or acute concerns for the patient herself. She has been struggling with increased rhinitis and ENT has her on 2 nasal sprays with good results. No fevers or chills. She declines flu shot today. She continues to struggle with houlder pain and decreased ROM has been avoiding surgery despite her orthopadiests recommendation. No fall or injury. Denies CP/palp/SOB/HA/congestion/fevers/GI or GU c/o. Taking meds as prescribed  Patient Care Team: [name], MD as PCP - General (Family Medicine) Veneda Melter, MD as Referring Physician (Cardiology) Dudley Major, MD as Referring Physician (Internal Medicine) Almyra Deforest, MD as Referring Physician (Cardiothoracic Surgery) Kerin Perna., MD as Referring Physician (Neurology)   Past Medical History:  Diagnosis Date  . Allergy   . Anxiety   . Atrial fibrillation ([place])   . Cervical cancer screening 09/07/2015   Menarche at 11 Irregular and heavy and painful flow secondary to fibroids No history of abnormal pap in past, last pap roughly 2003 G2P2, s/p 2 SVD history of abnormal MGM, 1 abnl bx right breast benign, normal otherwise Noconcerns today TAH b/l SPO for fibroids and migrainesand breast bx on right gyn surgeries  . Cough 08/13/2015  . Depression   . Depression with anxiety 06/29/2016  . Fainting    once to due heart out of rythm  . Fibromyalgia   . Frequent headaches   . GERD  (gastroesophageal reflux disease)   . 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 History of chicken pox   . Hx of blood clots    dvt  . Hyperlipidemia   . Hyperplastic colon polyp   . Hypertension   . Insomnia 08/13/2015  . Internal hemorrhoids   . Lesion of lung    xray in 2014 thought to be benign seen [facility]  . Measles    h/o  . Migraine   . Obesity   . Preventative health care 02/03/2017  . S/P AVR (aortic valve replacement) 03/19/2015  . Shoulder pain 06/03/2017  . UTI (lower urinary tract infection)   . UTI (urinary tract infection) 02/11/2016    Past Surgical History:  Procedure Laterality Date  . ABDOMINAL HYSTERECTOMY     TAH SPO  . ATRIAL ABLATION SURGERY    . DILATION AND CURETTAGE OF UTERUS     x 3  . loop heart    . SHOULDER SURGERY Left    arthroscopy for spurs  . TONSILLECTOMY    . TUBAL LIGATION      Family History  Problem Relation Age of Onset  . Colon polyps Father   . Alzheimer's disease Father   . Alcohol abuse Father   . Arthritis Mother   . Neuropathy Mother   . Hyperlipidemia Mother   . Other Mother        familial mediterranean fever  . Thyroid disease Mother   . Cancer Brother  prostate cancer  . Heart disease Brother        cardiomegaly  . Other Brother        familial mediterranean fever  . Asthma Maternal Grandmother   . Congestive Heart Failure Maternal Grandmother   . Heart disease Maternal Grandmother        chf  . Stroke Maternal Grandfather   . Diabetes Maternal Grandfather   . Heart disease Maternal Grandfather        hardening of the arteries  . Stroke Paternal Grandfather   . Atrial fibrillation Paternal Grandfather   . Alcohol abuse Paternal Grandfather   . Heart disease Paternal Grandfather        afib  . Interstitial cystitis Daughter   . Arthritis Son   . Alcohol abuse Son   . Mental illness Son        depression  . Colon cancer Unknown        parent  . Arthritis Paternal Uncle     Social  History   Social History  . Marital status: Married    Spouse name: N/A  . Number of children: 2  . Years of education: N/A   Occupational History  . [employer]   Social History Main Topics  . Smoking status: Never Smoker  . Smokeless tobacco: Never Used  . Alcohol use No  . Drug use: No  . Sexual activity: Yes     Comment: lives with husband and adult son, no major dietary restrictions, full diability   Other Topics Concern  . Not on file   Social History Narrative  . No narrative on file    Outpatient Medications Prior to Visit  Medication Sig Dispense Refill  . busPIRone (BUSPAR) 7.5 MG tablet TAKE 1 TABLET(7.5 MG) BY MOUTH THREE TIMES DAILY 90 tablet 5  . butalbital-acetaminophen-caffeine (FIORICET, ESGIC) 50-325-40 MG tablet Take 1 tablet by mouth 2 (two) times daily as needed for headache. 30 tablet 1  . DULoxetine (CYMBALTA) 60 MG capsule TAKE 1 CAPSULE(60 MG) BY MOUTH TWICE DAILY 60 capsule 0  . esomeprazole (NEXIUM) 40 MG capsule TAKE 1 CAPSULE(40 MG) BY MOUTH TWICE DAILY BEFORE A MEAL 60 capsule 6  . KRILL OIL PO Take 1 mg by mouth daily. Reported on 02/13/2016    . levothyroxine (SYNTHROID, LEVOTHROID) 125 MCG tablet Take 1 tablet (125 mcg total) by mouth daily. 90 tablet 1  . levothyroxine (SYNTHROID, LEVOTHROID) 125 MCG tablet TAKE 1 TABLET(125 MCG) BY MOUTH DAILY 30 tablet 0  . losartan (COZAAR) 100 MG tablet TAKE 1 TABLET(100 MG) BY MOUTH DAILY 30 tablet 6  . Magnesium 400 MG CAPS Take 400 mg by mouth daily. Reported on 02/13/2016    . meloxicam (MOBIC) 15 MG tablet TAKE 1 TABLET(15 MG) BY MOUTH DAILY AS NEEDED FOR PAIN 30 tablet 0  . metoprolol succinate (TOPROL-XL) 100 MG 24 hr tablet TAKE 1 TABLET BY MOUTH TWICE DAILY TAKE WITH OR IMMEDIATELY FOLLOWING A MEAL 60 tablet 6  . metoprolol succinate (TOPROL-XL) 50 MG 24 hr tablet TAKE 1 TABLET BY MOUTH TWICE DAILY( TAKE WITH OR IMMEDIATELY FOLLOWING A MEAL 60 tablet 6  . nitrofurantoin,  macrocrystal-monohydrate, (MACROBID) 100 MG capsule Take 1 capsule (100 mg total) by mouth 2 (two) times daily. 14 capsule 0  . ondansetron (ZOFRAN) 4 MG tablet TAKE 1 TABLET BY MOUTH EVERY 6 TO 8 HOURS AS NEEDED 30 tablet 2  . rizatriptan (MAXALT) 10 MG tablet Take 10 mg by mouth  as needed for migraine. May repeat in 2 hours if needed    . simvastatin (ZOCOR) 20 MG tablet TAKE 1 TABLET(20 MG) BY MOUTH EVERY EVENING 30 tablet 6  . topiramate (TOPAMAX) 50 MG tablet Takes 75 mg daily    . triamcinolone (NASACORT ALLERGY 24HR) 55 MCG/ACT AERO nasal inhaler Place 2 sprays into the nose 2 (two) times daily.    Marland Kitchen zolpidem (AMBIEN CR) 12.5 MG CR tablet Take 1 tablet (12.5 mg total) by mouth at bedtime as needed. for sleep 30 tablet 2  . Cholecalciferol (VITAMIN D3) 5000 units CAPS Take 5,000 Units by mouth daily. Reported on 02/13/2016    . nitrofurantoin, macrocrystal-monohydrate, (MACROBID) 100 MG capsule Take 1 capsule (100 mg total) by mouth 2 (two) times daily. 6 capsule 0   No facility-administered medications prior to visit.     Allergies  Allergen Reactions  . Ciprofloxacin Rash and Shortness Of Breath  . Sulfa Antibiotics Hives  . Sulfasalazine Hives  . Cortisone Other (See Comments)    Red flush and anxiety  . Tramadol Other (See Comments)    headache  . Zonisamide Other (See Comments)    unk  . Erythromycin Rash  . Erythromycin Base Rash  . Levofloxacin Nausea Only  . Lisinopril     Other reaction(s): Cough (ALLERGY/intolerance)  . Prednisone Rash and Anxiety    Review of Systems  Constitutional: Positive for malaise/fatigue. Negative for fever.  HENT: Negative for congestion.   Eyes: Negative for blurred vision.  Respiratory: Negative for shortness of breath.   Cardiovascular: Negative for chest pain, palpitations and leg swelling.  Gastrointestinal: Negative for abdominal pain, blood in stool and nausea.  Genitourinary: Negative for dysuria and frequency.    Musculoskeletal: Positive for joint pain. Negative for falls.  Skin: Negative for rash.  Neurological: Negative for dizziness, loss of consciousness and headaches.  Endo/Heme/Allergies: Negative for environmental allergies.  Psychiatric/Behavioral: Negative for depression. The patient is not nervous/anxious.        Objective:    Physical Exam  Constitutional: She is oriented to person, place, and time. She appears well-developed and well-nourished. No distress.  HENT:  Head: Normocephalic and atraumatic.  Nose: Nose normal.  Eyes: Right eye exhibits no discharge. Left eye exhibits no discharge.  Neck: Normal range of motion. Neck supple.  Cardiovascular: Normal rate and regular rhythm.   No murmur heard. Pulmonary/Chest: Effort normal and breath sounds normal.  Abdominal: Soft. Bowel sounds are normal. There is no tenderness.  Musculoskeletal: She exhibits no edema.  Neurological: She is alert and oriented to person, place, and time.  Skin: Skin is warm and dry.  Psychiatric: She has a normal mood and affect.  Nursing note and vitals reviewed.   BP 102/80 (BP Location: Left Arm, Patient Position: Sitting, Cuff Size: Normal)   Pulse 88   Temp 98.2 F (36.8 C) (Oral)   Resp 18   Wt 257 lb 3.2 oz (116.7 kg)   SpO2 98%   BMI 39.11 kg/m  Wt Readings from Last 3 Encounters:  06/03/17 257 lb 3.2 oz (116.7 kg)  02/17/17 251 lb (113.9 kg)  01/31/17 248 lb 12.8 oz (112.9 kg)   BP Readings from Last 3 Encounters:  06/03/17 102/80  02/17/17 (!) 141/80  01/31/17 (!) 161/62     Immunization History  Administered Date(s) Administered  . Tdap 05/26/2015  . Zoster 05/26/2015    Health Maintenance  Topic Date Due  . Hepatitis C Screening  12-Feb-1956  . HIV  Screening  04/24/1970  . MAMMOGRAM  01/28/2017  . INFLUENZA VACCINE  04/30/2017  . COLONOSCOPY  06/03/2017  . PAP SMEAR  09/06/2018  . TETANUS/TDAP  05/25/2025    Lab Results  Component Value Date   WBC 7.8  01/31/2017   HGB 13.8 01/31/2017   HCT 42.1 01/31/2017   PLT 264.0 01/31/2017   GLUCOSE 98 01/31/2017   CHOL 254 (H) 01/31/2017   TRIG 104.0 01/31/2017   HDL 67.20 01/31/2017   LDLCALC 166 (H) 01/31/2017   ALT 12 01/31/2017   AST 12 01/31/2017   NA 142 01/31/2017   K 4.0 01/31/2017   CL 109 01/31/2017   CREATININE 0.99 01/31/2017   BUN 19 01/31/2017   CO2 24 01/31/2017   TSH 1.26 01/31/2017    Lab Results  Component Value Date   TSH 1.26 01/31/2017   Lab Results  Component Value Date   WBC 7.8 01/31/2017   HGB 13.8 01/31/2017   HCT 42.1 01/31/2017   MCV 90.9 01/31/2017   PLT 264.0 01/31/2017   Lab Results  Component Value Date   NA 142 01/31/2017   K 4.0 01/31/2017   CO2 24 01/31/2017   GLUCOSE 98 01/31/2017   BUN 19 01/31/2017   CREATININE 0.99 01/31/2017   BILITOT 0.6 01/31/2017   ALKPHOS 73 01/31/2017   AST 12 01/31/2017   ALT 12 01/31/2017   PROT 6.6 01/31/2017   ALBUMIN 4.2 01/31/2017   CALCIUM 9.4 01/31/2017   GFR 60.45 01/31/2017   Lab Results  Component Value Date   CHOL 254 (H) 01/31/2017   Lab Results  Component Value Date   HDL 67.20 01/31/2017   Lab Results  Component Value Date   LDLCALC 166 (H) 01/31/2017   Lab Results  Component Value Date   TRIG 104.0 01/31/2017   Lab Results  Component Value Date   CHOLHDL 4 01/31/2017   No results found for: HGBA1C       Assessment & Plan:   Problem List Items Addressed This Visit    Allergy    Recently seen by ENT for PND and is now using Astelin and a nasal steroid which controls symptoms as long as she takes regularly.       Hyperlipidemia    Tolerating statin, encouraged heart healthy diet, avoid trans fats, minimize simple carbs and saturated fats. Increase exercise as tolerated      Relevant Orders   Lipid panel   Hypertension    Well controlled, no changes to meds. Encouraged heart healthy diet such as the DASH diet and exercise as tolerated.       Relevant Orders    CBC   Comprehensive metabolic panel   TSH   Obesity    Encouraged DASH diet, decrease po intake and increase exercise as tolerated. Needs 7-8 hours of sleep nightly. Avoid trans fats, eat small, frequent meals every 4-5 hours with lean proteins, complex carbs and healthy fats. Minimize simple carbs, bariatric referral      Cough    Still coughing, dry cough. The ENT has not done a scope recently encouraged to increase hydration and if cough persists then return for scoping.      UTI (urinary tract infection)    Sees urology and they are performing a cystoscopy next, patient denies any dysuria.      Relevant Orders   Urinalysis   Depression with anxiety    Has retired from work but is having to care for her disabled mother who  is currently in SNF s/p TKR but comes home tomorrow. Then she needs rides to doctors appt. Manages well with current.      Hair loss disorder    Increase fluids to 64 oz daily, consider fatty acid supplements, Zinc 50 mg and biotin. Check thyroid      Shoulder pain    Worsening shoulder pain and debility. Has decreased range of motion she has been advised by her orthopedist that she needs surgery but so far declines.       Other Visit Diagnoses    Breast cancer screening    -  Primary   Relevant Orders   MM DIGITAL SCREENING BILATERAL   Estrogen deficiency       Relevant Orders   DG Bone Density      I have discontinued Ms. Isidore's Vitamin D3. I am also having her maintain her ondansetron, rizatriptan, topiramate, triamcinolone, KRILL OIL PO, Magnesium, butalbital-acetaminophen-caffeine, busPIRone, simvastatin, esomeprazole, losartan, levothyroxine, nitrofurantoin (macrocrystal-monohydrate), metoprolol succinate, metoprolol succinate, zolpidem, meloxicam, levothyroxine, DULoxetine, hydrOXYzine, and pentosan polysulfate.  Meds ordered this encounter  Medications  . hydrOXYzine (ATARAX/VISTARIL) 25 MG tablet    Sig: Take 25 mg by mouth daily.  .  pentosan polysulfate (ELMIRON) 100 MG capsule    Sig: Take 200 mg by mouth 2 (two) times daily.    CMA served as Education administrator during this visit. History, Physical and Plan performed by medical provider. Documentation and orders reviewed and attested to.  Penni Homans, MD

## 2017-06-04 ENCOUNTER — Encounter: Payer: Self-pay | Admitting: Family Medicine

## 2017-06-04 LAB — COMPREHENSIVE METABOLIC PANEL
ALBUMIN: 4 g/dL (ref 3.5–5.2)
ALK PHOS: 73 U/L (ref 39–117)
ALT: 11 U/L (ref 0–35)
AST: 12 U/L (ref 0–37)
BUN: 15 mg/dL (ref 6–23)
CALCIUM: 9.1 mg/dL (ref 8.4–10.5)
CO2: 27 mEq/L (ref 19–32)
Chloride: 106 mEq/L (ref 96–112)
Creatinine, Ser: 0.91 mg/dL (ref 0.40–1.20)
GFR: 66.55 mL/min (ref >60.00)
Glucose, Bld: 95 mg/dL (ref 70–99)
Potassium: 4.4 mEq/L (ref 3.5–5.1)
Sodium: 141 mEq/L (ref 135–145)
TOTAL PROTEIN: 6.1 g/dL (ref 6.0–8.3)
Total Bilirubin: 0.4 mg/dL (ref 0.2–1.2)

## 2017-06-04 LAB — LIPID PANEL
Cholesterol: 200 mg/dL (ref 0–200)
HDL: 50.9 mg/dL (ref >39.00)
NonHDL: 149.56
Total CHOL/HDL Ratio: 4
Triglycerides: 343 mg/dL — ABNORMAL HIGH (ref 0.0–149.0)
VLDL: 68.6 mg/dL — AB (ref 0.0–40.0)

## 2017-06-04 LAB — CBC
HCT: 42.7 % (ref 36.0–46.0)
HEMOGLOBIN: 13.8 g/dL (ref 12.0–15.0)
MCHC: 32.4 g/dL (ref 30.0–36.0)
MCV: 91.7 fl (ref 78.0–100.0)
PLATELETS: 216 10*3/uL (ref 150.0–400.0)
RBC: 4.66 Mil/uL (ref 3.87–5.11)
RDW: 14.5 % (ref 11.5–15.5)
WBC: 7.5 10*3/uL (ref 4.0–10.5)

## 2017-06-04 LAB — TSH: TSH: 0.38 u[IU]/mL (ref 0.35–4.50)

## 2017-06-04 LAB — LDL CHOLESTEROL, DIRECT: Direct LDL: 103 mg/dL

## 2017-06-08 ENCOUNTER — Encounter: Payer: Self-pay | Admitting: Family Medicine

## 2017-06-09 ENCOUNTER — Other Ambulatory Visit: Payer: Self-pay | Admitting: Family Medicine

## 2017-06-09 MED ORDER — MELOXICAM 15 MG PO TABS: ORAL_TABLET | ORAL | 5 refills | Status: DC

## 2017-06-22 ENCOUNTER — Other Ambulatory Visit: Payer: Self-pay | Admitting: Family Medicine

## 2017-06-25 ENCOUNTER — Other Ambulatory Visit: Payer: Self-pay | Admitting: Family Medicine

## 2017-06-25 DIAGNOSIS — F430 Acute stress reaction: Secondary | ICD-10-CM

## 2017-07-14 ENCOUNTER — Telehealth: Payer: Self-pay | Admitting: Family Medicine

## 2017-07-14 NOTE — Telephone Encounter (Signed)
Alexandra Patterson [phone]  [name] called to inquire about a referral to dietician, she has not heard anything since last visit. Please call and advise.

## 2017-07-21 ENCOUNTER — Other Ambulatory Visit: Payer: Self-pay | Admitting: Family Medicine

## 2017-07-24 ENCOUNTER — Ambulatory Visit (HOSPITAL_BASED_OUTPATIENT_CLINIC_OR_DEPARTMENT_OTHER)
Admission: RE | Admit: 2017-07-24 | Discharge: 2017-07-24 | Disposition: A | Discharge status: Home / Self Care | Payer: Managed Care, Other (non HMO) | Source: Ambulatory Visit | Attending: Family Medicine | Admitting: Family Medicine

## 2017-07-24 ENCOUNTER — Encounter (HOSPITAL_BASED_OUTPATIENT_CLINIC_OR_DEPARTMENT_OTHER): Payer: Self-pay

## 2017-07-24 DIAGNOSIS — Z1239 Encounter for other screening for malignant neoplasm of breast: Secondary | ICD-10-CM

## 2017-07-24 DIAGNOSIS — Z1321 Encounter for screening for nutritional disorder: Secondary | ICD-10-CM | POA: Insufficient documentation

## 2017-07-24 DIAGNOSIS — E2839 Other primary ovarian failure: Secondary | ICD-10-CM | POA: Diagnosis present

## 2017-07-25 ENCOUNTER — Encounter: Payer: Self-pay | Admitting: Family Medicine

## 2017-08-09 ENCOUNTER — Other Ambulatory Visit: Payer: Self-pay | Admitting: Family Medicine

## 2017-08-18 ENCOUNTER — Other Ambulatory Visit: Payer: Self-pay | Admitting: Family Medicine

## 2017-09-05 ENCOUNTER — Telehealth: Payer: Self-pay | Admitting: Family Medicine

## 2017-09-05 NOTE — Telephone Encounter (Signed)
Pt did not pick up their Rx for ULTRAM dated 01/28/2017 Rx was shredded

## 2017-09-07 ENCOUNTER — Other Ambulatory Visit: Payer: Self-pay | Admitting: Family Medicine

## 2017-09-16 ENCOUNTER — Other Ambulatory Visit: Payer: Self-pay | Admitting: Family Medicine

## 2017-09-16 DIAGNOSIS — E0590 Thyrotoxicosis, unspecified without thyrotoxic crisis or storm: Secondary | ICD-10-CM

## 2017-09-23 ENCOUNTER — Other Ambulatory Visit: Payer: Self-pay | Admitting: Family Medicine

## 2017-10-07 ENCOUNTER — Ambulatory Visit (INDEPENDENT_AMBULATORY_CARE_PROVIDER_SITE_OTHER): Payer: Medicare Other | Admitting: Family Medicine

## 2017-10-07 ENCOUNTER — Encounter: Payer: Self-pay | Admitting: Family Medicine

## 2017-10-07 ENCOUNTER — Telehealth: Payer: Self-pay | Admitting: Family Medicine

## 2017-10-07 ENCOUNTER — Other Ambulatory Visit: Payer: Self-pay | Admitting: Family Medicine

## 2017-10-07 VITALS — BP 152/92 | HR 84 | Temp 98.7°F | Resp 16 | Ht 68.0 in | Wt 257.4 lb

## 2017-10-07 DIAGNOSIS — E669 Obesity, unspecified: Secondary | ICD-10-CM | POA: Diagnosis not present

## 2017-10-07 DIAGNOSIS — F430 Acute stress reaction: Secondary | ICD-10-CM | POA: Diagnosis not present

## 2017-10-07 DIAGNOSIS — G479 Sleep disorder, unspecified: Secondary | ICD-10-CM

## 2017-10-07 DIAGNOSIS — Z0000 Encounter for general adult medical examination without abnormal findings: Secondary | ICD-10-CM

## 2017-10-07 DIAGNOSIS — R0683 Snoring: Secondary | ICD-10-CM | POA: Diagnosis not present

## 2017-10-07 DIAGNOSIS — I10 Essential (primary) hypertension: Secondary | ICD-10-CM

## 2017-10-07 DIAGNOSIS — K635 Polyp of colon: Secondary | ICD-10-CM

## 2017-10-07 DIAGNOSIS — E061 Subacute thyroiditis: Secondary | ICD-10-CM | POA: Diagnosis not present

## 2017-10-07 DIAGNOSIS — E0590 Thyrotoxicosis, unspecified without thyrotoxic crisis or storm: Secondary | ICD-10-CM

## 2017-10-07 DIAGNOSIS — R51 Headache: Secondary | ICD-10-CM

## 2017-10-07 DIAGNOSIS — K219 Gastro-esophageal reflux disease without esophagitis: Secondary | ICD-10-CM | POA: Diagnosis not present

## 2017-10-07 DIAGNOSIS — E785 Hyperlipidemia, unspecified: Secondary | ICD-10-CM | POA: Diagnosis not present

## 2017-10-07 DIAGNOSIS — I4891 Unspecified atrial fibrillation: Secondary | ICD-10-CM

## 2017-10-07 DIAGNOSIS — F418 Other specified anxiety disorders: Secondary | ICD-10-CM

## 2017-10-07 DIAGNOSIS — R519 Headache, unspecified: Secondary | ICD-10-CM

## 2017-10-07 LAB — LIPID PANEL
CHOL/HDL RATIO: 3
Cholesterol: 234 mg/dL — ABNORMAL HIGH (ref 0–200)
HDL: 76.7 mg/dL (ref >39.00)
LDL CALC: 134 mg/dL — AB (ref 0–99)
NonHDL: 157.63
Triglycerides: 117 mg/dL (ref 0.0–149.0)
VLDL: 23.4 mg/dL (ref 0.0–40.0)

## 2017-10-07 LAB — COMPREHENSIVE METABOLIC PANEL
ALT: 17 U/L (ref 0–35)
AST: 15 U/L (ref 0–37)
Albumin: 4.5 g/dL (ref 3.5–5.2)
Alkaline Phosphatase: 87 U/L (ref 39–117)
BUN: 24 mg/dL — AB (ref 6–23)
CHLORIDE: 105 meq/L (ref 96–112)
CO2: 27 meq/L (ref 19–32)
CREATININE: 1.07 mg/dL (ref 0.40–1.20)
Calcium: 9.9 mg/dL (ref 8.4–10.5)
GFR: 55.15 mL/min — ABNORMAL LOW (ref >60.00)
Glucose, Bld: 108 mg/dL — ABNORMAL HIGH (ref 70–99)
Potassium: 4.6 mEq/L (ref 3.5–5.1)
SODIUM: 139 meq/L (ref 135–145)
Total Bilirubin: 0.5 mg/dL (ref 0.2–1.2)
Total Protein: 7 g/dL (ref 6.0–8.3)

## 2017-10-07 LAB — CBC
HEMATOCRIT: 46 % (ref 36.0–46.0)
HEMOGLOBIN: 14.9 g/dL (ref 12.0–15.0)
MCHC: 32.3 g/dL (ref 30.0–36.0)
MCV: 91.4 fl (ref 78.0–100.0)
Platelets: 261 10*3/uL (ref 150.0–400.0)
RBC: 5.03 Mil/uL (ref 3.87–5.11)
RDW: 14.7 % (ref 11.5–15.5)
WBC: 8.4 10*3/uL (ref 4.0–10.5)

## 2017-10-07 LAB — TSH: TSH: 2.34 u[IU]/mL (ref 0.35–4.50)

## 2017-10-07 MED ORDER — ESOMEPRAZOLE MAGNESIUM 40 MG PO CPDR: DELAYED_RELEASE_CAPSULE | ORAL | 3 refills | Status: DC

## 2017-10-07 MED ORDER — METOPROLOL SUCCINATE ER 100 MG PO TB24: ORAL_TABLET | ORAL | 6 refills | Status: DC

## 2017-10-07 MED ORDER — TOPIRAMATE 50 MG PO TABS: 50.0000 mg | ORAL_TABLET | Freq: Every day | ORAL | 3 refills | Status: DC

## 2017-10-07 MED ORDER — BUSPIRONE HCL 7.5 MG PO TABS: ORAL_TABLET | ORAL | 3 refills | Status: DC

## 2017-10-07 MED ORDER — METOPROLOL SUCCINATE ER 50 MG PO TB24: ORAL_TABLET | ORAL | 0 refills | Status: DC

## 2017-10-07 MED ORDER — LOSARTAN POTASSIUM 100 MG PO TABS: 100.0000 mg | ORAL_TABLET | Freq: Every day | ORAL | 3 refills | Status: DC

## 2017-10-07 MED ORDER — DULOXETINE HCL 60 MG PO CPEP: ORAL_CAPSULE | ORAL | 3 refills | Status: DC

## 2017-10-07 MED ORDER — LEVOTHYROXINE SODIUM 125 MCG PO TABS: 125.0000 ug | ORAL_TABLET | Freq: Every day | ORAL | 1 refills | Status: DC

## 2017-10-07 NOTE — Assessment & Plan Note (Addendum)
Encouraged DASH diet, decrease po intake and increase exercise as tolerated. Needs 7-8 hours of sleep nightly. Avoid trans fats, eat small, frequent meals every 4-5 hours with lean proteins, complex carbs and healthy fats. Minimize simple carbs, consier bariatric referral is going to try the Next 56 days group to try and manage her weight.

## 2017-10-07 NOTE — Assessment & Plan Note (Signed)
[facility] today

## 2017-10-07 NOTE — Progress Notes (Signed)
Subjective:  I acted as a Education administrator for [employer]. Alexandra Patterson, [place]   Patient ID: Alexandra Patterson, female    DOB: [birth year], 62 y.o.   MRN: [id number]  Chief Complaint  Patient presents with  . Annual Exam    HPI  Patient is in today for annual exam and follow-up on chronic medical problems including hypertension, migraine headaches, thyroid disease and hyperlipidemia.  She feels well today.  No recent febrile illness or hospitalization.  She is doing well with activities of daily living.  She tries to maintain a heart healthy diet and stay active. Denies CP/palp/SOB/HA/congestion/fevers/GI or GU c/o. Taking meds as prescribed  Patient Care Team: [name], MD as PCP - General (Family Medicine) Veneda Melter, MD as Referring Physician (Cardiology) Dudley Major, MD as Referring Physician (Internal Medicine) Almyra Deforest, MD as Referring Physician (Cardiothoracic Surgery) Kerin Perna., MD as Referring Physician (Neurology)   Past Medical History:  Diagnosis Date  . Allergy   . Anxiety   . Atrial fibrillation ([place])   . Cervical cancer screening 09/07/2015   Menarche at 11 Irregular and heavy and painful flow secondary to fibroids No history of abnormal pap in past, last pap roughly 2003 G2P2, s/p 2 SVD history of abnormal MGM, 1 abnl bx right breast benign, normal otherwise Noconcerns today TAH b/l SPO for fibroids and migrainesand breast bx on right gyn surgeries  . Cough 08/13/2015  . Depression   . Depression with anxiety 06/29/2016  . Fainting    once to due heart out of rythm  . Fibromyalgia   . Frequent headaches   . GERD (gastroesophageal reflux disease)   . 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 History of chicken pox   . Hx of blood clots    dvt  . Hyperlipidemia   . Hyperplastic colon polyp   . Hypertension   . Insomnia 08/13/2015  . Internal hemorrhoids   . Lesion of lung    xray in 2014 thought to be benign seen at [facility] pulmonology  .  Measles    h/o  . Migraine   . Obesity   . Preventative health care 02/03/2017  . S/P AVR (aortic valve replacement) 03/19/2015  . Shoulder pain 06/03/2017  . UTI (lower urinary tract infection)   . UTI (urinary tract infection) 02/11/2016    Past Surgical History:  Procedure Laterality Date  . ABDOMINAL HYSTERECTOMY     TAH SPO  . ATRIAL ABLATION SURGERY    . BREAST BIOPSY Right    Needle Biopsy  . DILATION AND CURETTAGE OF UTERUS     x 3  . loop heart    . SHOULDER SURGERY Left    arthroscopy for spurs  . TONSILLECTOMY    . TUBAL LIGATION      Family History  Problem Relation Age of Onset  . Colon polyps Father   . Alzheimer's disease Father   . Alcohol abuse Father   . Arthritis Mother        s/p TKR  . Neuropathy Mother   . Hyperlipidemia Mother   . Other Mother        familial mediterranean fever  . Thyroid disease Mother   . Cancer Brother        prostate cancer  . Heart disease Brother        cardiomegaly  . Other Brother        familial mediterranean fever  . Asthma Maternal Grandmother   . Congestive Heart  Failure Maternal Grandmother   . Heart disease Maternal Grandmother        chf  . Stroke Maternal Grandfather   . Diabetes Maternal Grandfather   . Heart disease Maternal Grandfather        hardening of the arteries  . Stroke Paternal Grandfather   . Atrial fibrillation Paternal Grandfather   . Alcohol abuse Paternal Grandfather   . Heart disease Paternal Grandfather        afib  . Interstitial cystitis Daughter   . Arthritis Son   . Alcohol abuse Son   . Mental illness Son        depression  . Colon cancer Unknown        parent  . Arthritis Paternal Uncle     Social History   Socioeconomic History  . Marital status: Married    Spouse name: Not on file  . Number of children: 2  . Years of education: Not on file  . Highest education level: Not on file  Social Needs  . Financial resource strain: Not on file  . Food insecurity - worry:  Not on file  . Food insecurity - inability: Not on file  . Transportation needs - medical: Not on file  . Transportation needs - non-medical: Not on file  Occupational History  . Occupation: Best boy: [employer]  Tobacco Use  . Smoking status: Never Smoker  . Smokeless tobacco: Never Used  Substance and Sexual Activity  . Alcohol use: No  . Drug use: No  . Sexual activity: Yes    Comment: lives with husband and adult son, no major dietary restrictions, full diability  Other Topics Concern  . Not on file  Social History Narrative  . Not on file    Outpatient Medications Prior to Visit  Medication Sig Dispense Refill  . butalbital-acetaminophen-caffeine (FIORICET, ESGIC) 50-325-40 MG tablet Take 1 tablet by mouth 2 (two) times daily as needed for headache. 30 tablet 1  . hydrOXYzine (ATARAX/VISTARIL) 25 MG tablet Take 25 mg by mouth daily.    Marland Kitchen KRILL OIL PO Take 1 mg by mouth daily. Reported on 02/13/2016    . levothyroxine (SYNTHROID, LEVOTHROID) 125 MCG tablet TAKE 1 TABLET(125 MCG) BY MOUTH DAILY 30 tablet 0  . Magnesium 400 MG CAPS Take 400 mg by mouth daily. Reported on 02/13/2016    . meloxicam (MOBIC) 15 MG tablet TAKE 1 TABLET(15 MG) BY MOUTH DAILY AS NEEDED FOR PAIN 30 tablet 5  . nitrofurantoin, macrocrystal-monohydrate, (MACROBID) 100 MG capsule Take 1 capsule (100 mg total) by mouth 2 (two) times daily. 14 capsule 0  . ondansetron (ZOFRAN) 4 MG tablet TAKE 1 TABLET BY MOUTH EVERY 6 TO 8 HOURS AS NEEDED 30 tablet 2  . pentosan polysulfate (ELMIRON) 100 MG capsule Take 200 mg by mouth 2 (two) times daily.    . rizatriptan (MAXALT) 10 MG tablet Take 10 mg by mouth as needed for migraine. May repeat in 2 hours if needed    . simvastatin (ZOCOR) 20 MG tablet TAKE 1 TABLET(20 MG) BY MOUTH EVERY EVENING 30 tablet 6  . triamcinolone (NASACORT ALLERGY 24HR) 55 MCG/ACT AERO nasal inhaler Place 2 sprays into the nose 2 (two) times daily.    Marland Kitchen zolpidem  (AMBIEN CR) 12.5 MG CR tablet Take 1 tablet (12.5 mg total) by mouth at bedtime as needed. for sleep 30 tablet 2  . busPIRone (BUSPAR) 7.5 MG tablet TAKE 1 TABLET(7.5 MG) BY MOUTH  THREE TIMES DAILY 90 tablet 3  . DULoxetine (CYMBALTA) 60 MG capsule TAKE 1 CAPSULE(60 MG) BY MOUTH TWICE DAILY 60 capsule 0  . esomeprazole (NEXIUM) 40 MG capsule TAKE 1 CAPSULE(40 MG) BY MOUTH TWICE DAILY BEFORE A MEAL 60 capsule 0  . levothyroxine (SYNTHROID, LEVOTHROID) 125 MCG tablet Take 1 tablet (125 mcg total) by mouth daily. 90 tablet 1  . losartan (COZAAR) 100 MG tablet TAKE 1 TABLET BY MOUTH DAILY 30 tablet 0  . metoprolol succinate (TOPROL-XL) 100 MG 24 hr tablet TAKE 1 TABLET BY MOUTH TWICE DAILY TAKE WITH OR IMMEDIATELY FOLLOWING A MEAL 60 tablet 6  . metoprolol succinate (TOPROL-XL) 50 MG 24 hr tablet TAKE 1 TABLET BY MOUTH TWICE DAILY WITH A MEAL OR IMMEDIATELY FOLLOWING A MEAL 60 tablet 0  . topiramate (TOPAMAX) 50 MG tablet Takes 75 mg daily     No facility-administered medications prior to visit.     Allergies  Allergen Reactions  . Ciprofloxacin Rash and Shortness Of Breath  . Sulfa Antibiotics Hives  . Sulfasalazine Hives  . Cortisone Other (See Comments)    Red flush and anxiety  . Tramadol Other (See Comments)    headache  . Zonisamide Other (See Comments)    unk  . Erythromycin Rash  . Erythromycin Base Rash  . Levofloxacin Nausea Only  . Lisinopril     Other reaction(s): Cough (ALLERGY/intolerance)  . Prednisone Rash and Anxiety    Review of Systems  Constitutional: Positive for malaise/fatigue. Negative for chills and fever.  HENT: Negative for congestion and hearing loss.   Eyes: Negative for discharge.  Respiratory: Negative for cough, sputum production and shortness of breath.   Cardiovascular: Negative for chest pain, palpitations and leg swelling.  Gastrointestinal: Negative for abdominal pain, blood in stool, constipation, diarrhea, heartburn, nausea and vomiting.    Genitourinary: Negative for dysuria, frequency, hematuria and urgency.  Musculoskeletal: Negative for back pain, falls and myalgias.  Skin: Negative for rash.  Neurological: Positive for headaches. Negative for dizziness, sensory change, loss of consciousness and weakness.  Endo/Heme/Allergies: Negative for environmental allergies. Does not bruise/bleed easily.  Psychiatric/Behavioral: Negative for depression and suicidal ideas. The patient does not have insomnia.        Objective:    Physical Exam  Constitutional: She is oriented to person, place, and time. She appears well-developed and well-nourished. No distress.  HENT:  Head: Normocephalic and atraumatic.  Eyes: Conjunctivae are normal.  Neck: Neck supple. No thyromegaly present.  Cardiovascular: Normal rate, regular rhythm and normal heart sounds.  No murmur heard. Pulmonary/Chest: Effort normal and breath sounds normal. No respiratory distress.  Abdominal: Soft. Bowel sounds are normal. She exhibits no distension and no mass. There is no tenderness.  Musculoskeletal: She exhibits no edema.  Lymphadenopathy:    She has no cervical adenopathy.  Neurological: She is alert and oriented to person, place, and time.  Skin: Skin is warm and dry.  Psychiatric: She has a normal mood and affect. Her behavior is normal.    BP (!) 152/92   Pulse 84   Temp 98.7 F (37.1 C) (Oral)   Resp 16   Ht 5\' 8"  (1.727 m)   Wt 257 lb 6.4 oz (116.8 kg)   SpO2 98%   BMI 39.14 kg/m  Wt Readings from Last 3 Encounters:  10/07/17 257 lb 6.4 oz (116.8 kg)  06/03/17 257 lb 3.2 oz (116.7 kg)  02/17/17 251 lb (113.9 kg)   BP Readings from Last 3 Encounters:  10/07/17 (!) 152/92  06/03/17 102/80  02/17/17 (!) 141/80     Immunization History  Administered Date(s) Administered  . Tdap 05/26/2015  . Zoster 05/26/2015    Health Maintenance  Topic Date Due  . Hepatitis C Screening  10-27-1955  . HIV Screening  04/24/1970  . COLONOSCOPY   06/03/2017  . INFLUENZA VACCINE  06/09/2018 (Originally 04/30/2017)  . PAP SMEAR  09/06/2018  . MAMMOGRAM  07/25/2019  . TETANUS/TDAP  05/25/2025    Lab Results  Component Value Date   WBC 7.5 06/03/2017   HGB 13.8 06/03/2017   HCT 42.7 06/03/2017   PLT 216.0 06/03/2017   GLUCOSE 95 06/03/2017   CHOL 200 06/03/2017   TRIG 343.0 (H) 06/03/2017   HDL 50.90 06/03/2017   LDLDIRECT 103.0 06/03/2017   LDLCALC 166 (H) 01/31/2017   ALT 11 06/03/2017   AST 12 06/03/2017   NA 141 06/03/2017   K 4.4 06/03/2017   CL 106 06/03/2017   CREATININE 0.91 06/03/2017   BUN 15 06/03/2017   CO2 27 06/03/2017   TSH 0.38 06/03/2017    Lab Results  Component Value Date   TSH 0.38 06/03/2017   Lab Results  Component Value Date   WBC 7.5 06/03/2017   HGB 13.8 06/03/2017   HCT 42.7 06/03/2017   MCV 91.7 06/03/2017   PLT 216.0 06/03/2017   Lab Results  Component Value Date   NA 141 06/03/2017   K 4.4 06/03/2017   CO2 27 06/03/2017   GLUCOSE 95 06/03/2017   BUN 15 06/03/2017   CREATININE 0.91 06/03/2017   BILITOT 0.4 06/03/2017   ALKPHOS 73 06/03/2017   AST 12 06/03/2017   ALT 11 06/03/2017   PROT 6.1 06/03/2017   ALBUMIN 4.0 06/03/2017   CALCIUM 9.1 06/03/2017   GFR 66.55 06/03/2017   Lab Results  Component Value Date   CHOL 200 06/03/2017   Lab Results  Component Value Date   HDL 50.90 06/03/2017   Lab Results  Component Value Date   LDLCALC 166 (H) 01/31/2017   Lab Results  Component Value Date   TRIG 343.0 (H) 06/03/2017   Lab Results  Component Value Date   CHOLHDL 4 06/03/2017   No results found for: HGBA1C       Assessment & Plan:   Problem List Items Addressed This Visit    GERD (gastroesophageal reflux disease)    Avoid offending foods, start probiotics. Do not eat large meals in late evening and consider raising head of bed.       Relevant Medications   esomeprazole (NEXIUM) 40 MG capsule   Other Relevant Orders   CBC   Comprehensive metabolic  panel   Hyperlipidemia - Primary    Tolerating statin, encouraged heart healthy diet, avoid trans fats, minimize simple carbs and saturated fats. Increase exercise as tolerated      Relevant Medications   losartan (COZAAR) 100 MG tablet   metoprolol succinate (TOPROL-XL) 100 MG 24 hr tablet   metoprolol succinate (TOPROL-XL) 50 MG 24 hr tablet   Other Relevant Orders   Lipid panel   Hypertension    Not well controlled, no changes to meds but she will monitor and let us know if continues to run high. Encouraged heart healthy diet such as the DASH diet and exercise as tolerated.       Relevant Medications   losartan (COZAAR) 100 MG tablet   metoprolol succinate (TOPROL-XL) 100 MG 24 hr tablet   metoprolol succinate (TOPROL-XL) 50  MG 24 hr tablet   Hyperplastic colon polyp    Last colonoscopy patient cannot remember. She believes her last colonoscopy was by [facility]. She believes it was in last 5 years and was told to repeat in 5 years.       Relevant Orders   CBC   Comprehensive metabolic panel   Atrial fibrillation (HCC)    Well controlled no episodes in 2 years. Has a new loop monitor placed and is doing well. Due for an Echo next year.      Relevant Medications   losartan (COZAAR) 100 MG tablet   metoprolol succinate (TOPROL-XL) 100 MG 24 hr tablet   metoprolol succinate (TOPROL-XL) 50 MG 24 hr tablet   Acute stress reaction   Relevant Medications   busPIRone (BUSPAR) 7.5 MG tablet   DULoxetine (CYMBALTA) 60 MG capsule   Thyroiditis, subacute    Heck labs today      Relevant Medications   levothyroxine (SYNTHROID, LEVOTHROID) 125 MCG tablet   metoprolol succinate (TOPROL-XL) 100 MG 24 hr tablet   metoprolol succinate (TOPROL-XL) 50 MG 24 hr tablet   Depression with anxiety    Doing well despite significant stress with her adult children recently. tolerating Cymbalta and Buspar continue the same.       Relevant Medications   busPIRone (BUSPAR) 7.5 MG tablet    DULoxetine (CYMBALTA) 60 MG capsule   Preventative health care    Patient encouraged to maintain heart healthy diet, regular exercise, adequate sleep. Consider daily probiotics. Take medications as prescribed. Given and reviewed copy of ACP documents from [place] of State and encouraged to complete and return       Other Visit Diagnoses    Hyperthyroidism       Relevant Medications   levothyroxine (SYNTHROID, LEVOTHROID) 125 MCG tablet   metoprolol succinate (TOPROL-XL) 100 MG 24 hr tablet   metoprolol succinate (TOPROL-XL) 50 MG 24 hr tablet   Other Relevant Orders   TSH      I have changed Burnett Corrente. Loth's losartan and topiramate. I am also having her maintain her ondansetron, rizatriptan, triamcinolone, KRILL OIL PO, Magnesium, butalbital-acetaminophen-caffeine, simvastatin, nitrofurantoin (macrocrystal-monohydrate), zolpidem, levothyroxine, hydrOXYzine, pentosan polysulfate, meloxicam, busPIRone, DULoxetine, esomeprazole, levothyroxine, metoprolol succinate, and metoprolol succinate.  Meds ordered this encounter  Medications  . busPIRone (BUSPAR) 7.5 MG tablet    Sig: TAKE 1 TABLET(7.5 MG) BY MOUTH THREE TIMES DAILY    Dispense:  90 tablet    Refill:  3  . DULoxetine (CYMBALTA) 60 MG capsule    Sig: TAKE 1 CAPSULE(60 MG) BY MOUTH TWICE DAILY    Dispense:  60 capsule    Refill:  3  . esomeprazole (NEXIUM) 40 MG capsule    Sig: TAKE 1 CAPSULE(40 MG) BY MOUTH TWICE DAILY BEFORE A MEAL    Dispense:  60 capsule    Refill:  3  . levothyroxine (SYNTHROID, LEVOTHROID) 125 MCG tablet    Sig: Take 1 tablet (125 mcg total) by mouth daily.    Dispense:  90 tablet    Refill:  1  . losartan (COZAAR) 100 MG tablet    Sig: Take 1 tablet (100 mg total) by mouth daily.    Dispense:  30 tablet    Refill:  3  . metoprolol succinate (TOPROL-XL) 100 MG 24 hr tablet    Sig: TAKE 1 TABLET BY MOUTH TWICE DAILY TAKE WITH OR IMMEDIATELY FOLLOWING A MEAL    Dispense:  60 tablet    Refill:  6    . metoprolol succinate (TOPROL-XL) 50 MG 24 hr tablet    Sig: TAKE 1 TABLET BY MOUTH TWICE DAILY WITH A MEAL OR IMMEDIATELY FOLLOWING A MEAL    Dispense:  60 tablet    Refill:  0  . topiramate (TOPAMAX) 50 MG tablet    Sig: Take 1 tablet (50 mg total) by mouth daily. Takes 75 mg daily    Dispense:  45 tablet    Refill:  3    CMA served as scribe during this visit. History, Physical and Plan performed by medical provider. Documentation and orders reviewed and attested to.  Penni Homans, MD

## 2017-10-07 NOTE — Patient Instructions (Addendum)
Shingrix is the new shingles shot, 2 shots over 2-6 months. Check with insurance regarding payment then go to pharmacy to receive.   Preventive Care 40-64 Years, Female Preventive care refers to lifestyle choices and visits with your health care provider that can promote health and wellness. What does preventive care include?  A yearly physical exam. This is also called an annual well check.  Dental exams once or twice a year.  Routine eye exams. Ask your health care provider how often you should have your eyes checked.  Personal lifestyle choices, including: ? Daily care of your teeth and gums. ? Regular physical activity. ? Eating a healthy diet. ? Avoiding tobacco and drug use. ? Limiting alcohol use. ? Practicing safe sex. ? Taking low-dose aspirin daily starting at age 15. ? Taking vitamin and mineral supplements as recommended by your health care provider. What happens during an annual well check? The services and screenings done by your health care provider during your annual well check will depend on your age, overall health, lifestyle risk factors, and family history of disease. Counseling Your health care provider may ask you questions about your:  Alcohol use.  Tobacco use.  Drug use.  Emotional well-being.  Home and relationship well-being.  Sexual activity.  Eating habits.  Work and work Statistician.  Method of birth control.  Menstrual cycle.  Pregnancy history.  Screening You may have the following tests or measurements:  Height, weight, and BMI.  Blood pressure.  Lipid and cholesterol levels. These may be checked every 5 years, or more frequently if you are over 34 years old.  Skin check.  Lung cancer screening. You may have this screening every year starting at age 78 if you have a 30-pack-year history of smoking and currently smoke or have quit within the past 15 years.  Fecal occult blood test (FOBT) of the stool. You may have this test  every year starting at age 31.  Flexible sigmoidoscopy or colonoscopy. You may have a sigmoidoscopy every 5 years or a colonoscopy every 10 years starting at age 52.  Hepatitis C blood test.  Hepatitis B blood test.  Sexually transmitted disease (STD) testing.  Diabetes screening. This is done by checking your blood sugar (glucose) after you have not eaten for a while (fasting). You may have this done every 1-3 years.  Mammogram. This may be done every 1-2 years. Talk to your health care provider about when you should start having regular mammograms. This may depend on whether you have a family history of breast cancer.  BRCA-related cancer screening. This may be done if you have a family history of breast, ovarian, tubal, or peritoneal cancers.  Pelvic exam and Pap test. This may be done every 3 years starting at age 20. Starting at age 48, this may be done every 5 years if you have a Pap test in combination with an HPV test.  Bone density scan. This is done to screen for osteoporosis. You may have this scan if you are at high risk for osteoporosis.  Discuss your test results, treatment options, and if necessary, the need for more tests with your health care provider. Vaccines Your health care provider may recommend certain vaccines, such as:  Influenza vaccine. This is recommended every year.  Tetanus, diphtheria, and acellular pertussis (Tdap, Td) vaccine. You may need a Td booster every 10 years.  Varicella vaccine. You may need this if you have not been vaccinated.  Zoster vaccine. You may need this  after age 67.  Measles, mumps, and rubella (MMR) vaccine. You may need at least one dose of MMR if you were born in 1957 or later. You may also need a second dose.  Pneumococcal 13-valent conjugate (PCV13) vaccine. You may need this if you have certain conditions and were not previously vaccinated.  Pneumococcal polysaccharide (PPSV23) vaccine. You may need one or two doses if you  smoke cigarettes or if you have certain conditions.  Meningococcal vaccine. You may need this if you have certain conditions.  Hepatitis A vaccine. You may need this if you have certain conditions or if you travel or work in places where you may be exposed to hepatitis A.  Hepatitis B vaccine. You may need this if you have certain conditions or if you travel or work in places where you may be exposed to hepatitis B.  Haemophilus influenzae type b (Hib) vaccine. You may need this if you have certain conditions.  Talk to your health care provider about which screenings and vaccines you need and how often you need them. This information is not intended to replace advice given to you by your health care provider. Make sure you discuss any questions you have with your health care provider. Document Released: 10/13/2015 Document Revised: 06/05/2016 Document Reviewed: 07/18/2015 Elsevier Interactive Patient Education  [employer].

## 2017-10-07 NOTE — Telephone Encounter (Signed)
Spoke with pharmacist  

## 2017-10-07 NOTE — Assessment & Plan Note (Signed)
Has a migraine today but has not eaten today. Encouraged increased hydration, 64 ounces of clear fluids daily. Minimize alcohol and caffeine. Eat small frequent meals with lean proteins and complex carbs. Avoid high and low blood sugars. Get adequate sleep, 7-8 hours a night. Needs exercise daily preferably in the morning.

## 2017-10-07 NOTE — Telephone Encounter (Signed)
Copied from [place] [id number]. Topic: Quick Communication - Rx Refill/Question >> Oct 07, 2017  3:39 PM Oliver Pila B wrote: Has the patient contacted their pharmacy? YesPharmacy called and said the Rx of  busPIRone (BUSPAR) 7.5 MG tablet [[id number]] is not available and is needing an alternative Rx, contact pharmacy to advise

## 2017-10-07 NOTE — Assessment & Plan Note (Signed)
Tolerating statin, encouraged heart healthy diet, avoid trans fats, minimize simple carbs and saturated fats. Increase exercise as tolerated 

## 2017-10-07 NOTE — Telephone Encounter (Signed)
I assume the Buspirone 10 mg tabs are available. Sig 1 tab po tid, disp #90 with 1 rf

## 2017-10-07 NOTE — Assessment & Plan Note (Signed)
Well controlled no episodes in 2 years. Has a new loop monitor placed and is doing well. Due for an Echo next year.

## 2017-10-07 NOTE — Telephone Encounter (Signed)
Copied from [place] [phone]. Topic: General - Other >> Oct 07, 2017  2:15 PM Yvette Rack wrote: Reason for CRM: [facility] [place] [place] [phone] (Phone) [phone] (Fax) calling stating that pt has two prescription for Metoprolol and wanted to know which dosage she pt be on the 50 mg or the 100mg  or do she need to be on both please call them back

## 2017-10-07 NOTE — Assessment & Plan Note (Signed)
Last colonoscopy patient cannot remember. She believes her last colonoscopy was by [facility]. She believes it was in last 5 years and was told to repeat in 5 years.

## 2017-10-07 NOTE — Assessment & Plan Note (Signed)
Doing well despite significant stress with her adult children recently. tolerating Cymbalta and Buspar continue the same.

## 2017-10-07 NOTE — Telephone Encounter (Signed)
Please advise 

## 2017-10-07 NOTE — Assessment & Plan Note (Signed)
>>  ASSESSMENT AND PLAN FOR CLASS 2 SEVERE OBESITY WITH SERIOUS COMORBIDITY AND BODY MASS INDEX (BMI) OF 36.0 TO 36.9 IN ADULT, UNSPECIFIED OBESITY TYPE (HCC) WRITTEN ON 10/07/2017  1:56 PM BY BLYTH, STACEY A, MD  Encouraged DASH diet, decrease po intake and increase exercise as tolerated. Needs 7-8 hours of sleep nightly. Avoid trans fats, eat small, frequent meals every 4-5 hours with lean proteins, complex carbs and healthy fats. Minimize simple carbs, consier bariatric referral is going to try the Next 56 days group to try and manage her weight.

## 2017-10-07 NOTE — Assessment & Plan Note (Signed)
Patient encouraged to maintain heart healthy diet, regular exercise, adequate sleep. Consider daily probiotics. Take medications as prescribed. Given and reviewed copy of ACP documents from [place] Secretary of State and encouraged to complete and return 

## 2017-10-07 NOTE — Assessment & Plan Note (Addendum)
Not well controlled, no changes to meds but she will monitor and let us know if continues to run high. Encouraged heart healthy diet such as the DASH diet and exercise as tolerated.

## 2017-10-07 NOTE — Assessment & Plan Note (Signed)
Avoid offending foods, start probiotics. Do not eat large meals in late evening and consider raising head of bed.  

## 2017-10-08 ENCOUNTER — Encounter: Payer: Self-pay | Admitting: Family Medicine

## 2017-10-08 ENCOUNTER — Encounter: Payer: Self-pay | Admitting: *Deleted

## 2017-10-08 MED ORDER — BUSPIRONE HCL 10 MG PO TABS: 10.0000 mg | ORAL_TABLET | Freq: Three times a day (TID) | ORAL | 1 refills | Status: DC

## 2017-10-08 NOTE — Telephone Encounter (Signed)
Rx sent 

## 2017-10-10 ENCOUNTER — Other Ambulatory Visit: Payer: Self-pay

## 2017-10-10 MED ORDER — SIMVASTATIN 40 MG PO TABS: 40.0000 mg | ORAL_TABLET | Freq: Every day | ORAL | 1 refills | Status: DC

## 2017-10-17 DIAGNOSIS — M170 Bilateral primary osteoarthritis of knee: Secondary | ICD-10-CM | POA: Diagnosis not present

## 2017-10-27 ENCOUNTER — Encounter: Payer: Self-pay | Admitting: Family Medicine

## 2017-11-04 ENCOUNTER — Ambulatory Visit (INDEPENDENT_AMBULATORY_CARE_PROVIDER_SITE_OTHER): Payer: Medicare Other | Admitting: Podiatry

## 2017-11-04 ENCOUNTER — Ambulatory Visit (INDEPENDENT_AMBULATORY_CARE_PROVIDER_SITE_OTHER): Payer: Medicare Other

## 2017-11-04 ENCOUNTER — Other Ambulatory Visit: Payer: Self-pay | Admitting: Family Medicine

## 2017-11-04 DIAGNOSIS — M778 Other enthesopathies, not elsewhere classified: Secondary | ICD-10-CM

## 2017-11-04 DIAGNOSIS — M79671 Pain in right foot: Secondary | ICD-10-CM | POA: Diagnosis not present

## 2017-11-04 DIAGNOSIS — R238 Other skin changes: Secondary | ICD-10-CM | POA: Diagnosis not present

## 2017-11-04 DIAGNOSIS — M7752 Other enthesopathy of left foot: Secondary | ICD-10-CM | POA: Diagnosis not present

## 2017-11-04 DIAGNOSIS — M779 Enthesopathy, unspecified: Principal | ICD-10-CM

## 2017-11-04 DIAGNOSIS — L603 Nail dystrophy: Secondary | ICD-10-CM

## 2017-11-04 DIAGNOSIS — M79672 Pain in left foot: Secondary | ICD-10-CM | POA: Diagnosis not present

## 2017-11-04 DIAGNOSIS — L608 Other nail disorders: Secondary | ICD-10-CM | POA: Diagnosis not present

## 2017-11-04 DIAGNOSIS — E0590 Thyrotoxicosis, unspecified without thyrotoxic crisis or storm: Secondary | ICD-10-CM

## 2017-11-04 NOTE — Progress Notes (Signed)
Subjective:  Patient ID: Alexandra Patterson, female    DOB: 1955-11-14,  MRN: [id number] HPI Chief Complaint  Patient presents with  . Nail Problem    B/L 1-5th toenails discoloration x years; tender to the touch Tx: OTC fungal liquid  . Toe Pain    L 3rd toe pain and numbing x 30 years; 5/10 achy pain - No injury -Tx: anti-inflammatories    62 y.o. female presents with the above complaint.     Past Medical History:  Diagnosis Date  . Allergy   . Anxiety   . Atrial fibrillation ([place])   . Cervical cancer screening 09/07/2015   Menarche at 11 Irregular and heavy and painful flow secondary to fibroids No history of abnormal pap in past, last pap roughly 2003 G2P2, s/p 2 SVD history of abnormal MGM, 1 abnl bx right breast benign, normal otherwise Noconcerns today TAH b/l SPO for fibroids and migrainesand breast bx on right gyn surgeries  . Cough 08/13/2015  . Depression   . Depression with anxiety 06/29/2016  . Fainting    once to due heart out of rythm  . Fibromyalgia   . Frequent headaches   . GERD (gastroesophageal reflux disease)   . 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 History of chicken pox   . Hx of blood clots    dvt  . Hyperlipidemia   . Hyperplastic colon polyp   . Hypertension   . Insomnia 08/13/2015  . Internal hemorrhoids   . Lesion of lung    xray in 2014 thought to be benign seen at [facility] pulmonology  . Measles    h/o  . Migraine   . Obesity   . Preventative health care 02/03/2017  . S/P AVR (aortic valve replacement) 03/19/2015  . Shoulder pain 06/03/2017  . UTI (lower urinary tract infection)   . UTI (urinary tract infection) 02/11/2016   Past Surgical History:  Procedure Laterality Date  . ABDOMINAL HYSTERECTOMY     TAH SPO  . ATRIAL ABLATION SURGERY    . BREAST BIOPSY Right    Needle Biopsy  . DILATION AND CURETTAGE OF UTERUS     x 3  . loop heart    . SHOULDER SURGERY Left    arthroscopy for spurs  . TONSILLECTOMY    . TUBAL LIGATION      Current  Outpatient Medications:  .  busPIRone (BUSPAR) 10 MG tablet, Take 1 tablet (10 mg total) by mouth 3 (three) times daily., Disp: 90 tablet, Rfl: 1 .  butalbital-acetaminophen-caffeine (FIORICET, ESGIC) 50-325-40 MG tablet, Take 1 tablet by mouth 2 (two) times daily as needed for headache., Disp: 30 tablet, Rfl: 1 .  DULoxetine (CYMBALTA) 60 MG capsule, TAKE 1 CAPSULE(60 MG) BY MOUTH TWICE DAILY, Disp: 60 capsule, Rfl: 3 .  esomeprazole (NEXIUM) 40 MG capsule, TAKE 1 CAPSULE(40 MG) BY MOUTH TWICE DAILY BEFORE A MEAL, Disp: 60 capsule, Rfl: 3 .  hydrOXYzine (ATARAX/VISTARIL) 25 MG tablet, Take 25 mg by mouth daily., Disp: , Rfl:  .  KRILL OIL PO, Take 1 mg by mouth daily. Reported on 02/13/2016, Disp: , Rfl:  .  levothyroxine (SYNTHROID, LEVOTHROID) 125 MCG tablet, TAKE 1 TABLET(125 MCG) BY MOUTH DAILY, Disp: 30 tablet, Rfl: 0 .  levothyroxine (SYNTHROID, LEVOTHROID) 125 MCG tablet, Take 1 tablet (125 mcg total) by mouth daily., Disp: 90 tablet, Rfl: 1 .  losartan (COZAAR) 100 MG tablet, Take 1 tablet (100 mg total) by mouth daily., Disp: 30 tablet, Rfl: 3 .  meloxicam (MOBIC) 15 MG tablet, TAKE 1 TABLET(15 MG) BY MOUTH DAILY AS NEEDED FOR PAIN, Disp: 30 tablet, Rfl: 5 .  metoprolol succinate (TOPROL-XL) 100 MG 24 hr tablet, TAKE 1 TABLET BY MOUTH TWICE DAILY TAKE WITH OR IMMEDIATELY FOLLOWING A MEAL, Disp: 60 tablet, Rfl: 6 .  metoprolol succinate (TOPROL-XL) 50 MG 24 hr tablet, TAKE 1 TABLET BY MOUTH TWICE DAILY WITH A MEAL OR IMMEDIATELY FOLLOWING A MEAL, Disp: 60 tablet, Rfl: 0 .  ondansetron (ZOFRAN) 4 MG tablet, TAKE 1 TABLET BY MOUTH EVERY 6 TO 8 HOURS AS NEEDED, Disp: 30 tablet, Rfl: 2 .  rizatriptan (MAXALT) 10 MG tablet, Take 10 mg by mouth as needed for migraine. May repeat in 2 hours if needed, Disp: , Rfl:  .  simvastatin (ZOCOR) 40 MG tablet, Take 1 tablet (40 mg total) by mouth at bedtime., Disp: 90 tablet, Rfl: 1 .  topiramate (TOPAMAX) 50 MG tablet, Take 1 tablet (50 mg total) by mouth  daily. Takes 75 mg daily, Disp: 45 tablet, Rfl: 3 .  zolpidem (AMBIEN CR) 12.5 MG CR tablet, Take 1 tablet (12.5 mg total) by mouth at bedtime as needed. for sleep, Disp: 30 tablet, Rfl: 2 .  busPIRone (BUSPAR) 7.5 MG tablet, TAKE 1 TABLET(7.5 MG) BY MOUTH THREE TIMES DAILY (Patient not taking: Reported on 11/04/2017), Disp: 90 tablet, Rfl: 3 .  Magnesium 400 MG CAPS, Take 400 mg by mouth daily. Reported on 02/13/2016, Disp: , Rfl:  .  nitrofurantoin, macrocrystal-monohydrate, (MACROBID) 100 MG capsule, Take 1 capsule (100 mg total) by mouth 2 (two) times daily. (Patient not taking: Reported on 11/04/2017), Disp: 14 capsule, Rfl: 0 .  pentosan polysulfate (ELMIRON) 100 MG capsule, Take 200 mg by mouth 2 (two) times daily., Disp: , Rfl:  .  triamcinolone (NASACORT ALLERGY 24HR) 55 MCG/ACT AERO nasal inhaler, Place 2 sprays into the nose 2 (two) times daily., Disp: , Rfl:   Allergies  Allergen Reactions  . Ciprofloxacin Rash and Shortness Of Breath  . Sulfa Antibiotics Hives  . Sulfasalazine Hives  . Cortisone Other (See Comments)    Red flush and anxiety  . Tramadol Other (See Comments)    headache  . Zonisamide Other (See Comments)    unk  . Erythromycin Rash  . Erythromycin Base Rash  . Levofloxacin Nausea Only  . Lisinopril     Other reaction(s): Cough (ALLERGY/intolerance)  . Prednisone Rash and Anxiety   Review of Systems  All other systems reviewed and are negative.  Objective:  There were no vitals filed for this visit.  General: Well developed, nourished, in no acute distress, alert and oriented x3   Dermatological: Skin is warm, dry and supple bilateral. Nails x 10 are well maintained; remaining integument appears unremarkable at this time. There are no open sores, no preulcerative lesions, no rash or signs of infection present.  Hallux nails bilaterally are thickened dystrophic cannot rule out onychomycosis though there is no signs of onychomycosis to the cutis.  Vascular:  Dorsalis Pedis artery and Posterior Tibial artery pedal pulses are 2/4 bilateral with immedate capillary fill time. Pedal hair growth present. No varicosities and no lower extremity edema present bilateral.   Neruologic: Grossly intact via light touch bilateral. Vibratory intact via tuning fork bilateral. Protective threshold with Semmes Wienstein monofilament intact to all pedal sites bilateral. Patellar and Achilles deep tendon reflexes 2+ bilateral. No Babinski or clonus noted bilateral.   Musculoskeletal: No gross boney pedal deformities bilateral. No pain, crepitus, or limitation  noted with foot and ankle range of motion bilateral. Muscular strength 5/5 in all groups tested bilateral.  Orthopedic evaluation demonstrates all joints distal ankle full range of motion without crepitation.  She has no reproducible pain on palpation of the toes.  Generalized tenderness throughout the foot however.  Gait: Unassisted, Nonantalgic.    Radiographs:  No acute findings.  Assessment & Plan:   Assessment: Nail dystrophy hallux bilateral.  Fibromyalgia and back problems resulting in bilateral foot pain.  Could possibly be associated with diabetic peripheral neuropathy.  Plan: Discussed etiology pathology conservative versus surgical therapies.  Discussed Lyrica with her she has tried gabapentin before to no avail.  We may try Lyrica in the near future.  She is going to have an injection to her left knee does not want start the Lyrica until after that.  At this point we took samples of the toenails to be sent for pathologic evaluation hallux nails bilaterally.     Max T. [place], [place]

## 2017-11-06 ENCOUNTER — Telehealth: Payer: Self-pay | Admitting: Family Medicine

## 2017-11-06 NOTE — Telephone Encounter (Signed)
Copied from [place]. Topic: Quick Communication - Rx Refill/Question >> Nov 06, 2017 12:50 PM Scherrie Gerlach wrote: Medication:  esomeprazole (NEXIUM) 40 MG capsule  Pharmacy called to advise this script needs a prior auth in order for pt to take 2 X a day. Insurance will only pay for one day. Pharmacy states they are going to refax this request.

## 2017-11-07 NOTE — Telephone Encounter (Signed)
Can you start the PA process?

## 2017-11-10 NOTE — Telephone Encounter (Signed)
PA initiated via Covermymeds; KEY: [id number]. Awaiting determination.

## 2017-11-10 NOTE — Telephone Encounter (Signed)
PA approved for quantity limit 60 capsules for 30 days. Effective 09/30/2017 through 09/29/2018.

## 2017-11-17 ENCOUNTER — Encounter: Payer: Self-pay | Admitting: Podiatry

## 2017-11-17 NOTE — Telephone Encounter (Signed)
Dr. Milinda Pointer had reviewed fungal culture results as negative. I informed pt and she states understanding.

## 2017-11-17 NOTE — Telephone Encounter (Signed)
-----   Message from Garrel Ridgel, [place] sent at 11/15/2017 10:31 AM EST ----- Negative for fungus.

## 2017-11-19 ENCOUNTER — Institutional Professional Consult (permissible substitution): Payer: Self-pay | Admitting: Pulmonary Disease

## 2017-11-25 DIAGNOSIS — Z959 Presence of cardiac and vascular implant and graft, unspecified: Secondary | ICD-10-CM | POA: Diagnosis not present

## 2017-11-25 DIAGNOSIS — I480 Paroxysmal atrial fibrillation: Secondary | ICD-10-CM | POA: Diagnosis not present

## 2017-12-02 ENCOUNTER — Other Ambulatory Visit: Payer: Self-pay | Admitting: Family Medicine

## 2017-12-02 DIAGNOSIS — E0590 Thyrotoxicosis, unspecified without thyrotoxic crisis or storm: Secondary | ICD-10-CM

## 2017-12-09 ENCOUNTER — Ambulatory Visit: Payer: Medicare Other | Admitting: Podiatry

## 2017-12-12 DIAGNOSIS — M1711 Unilateral primary osteoarthritis, right knee: Secondary | ICD-10-CM | POA: Diagnosis not present

## 2017-12-16 ENCOUNTER — Ambulatory Visit: Payer: Medicare Other | Admitting: Podiatry

## 2017-12-18 DIAGNOSIS — Z4509 Encounter for adjustment and management of other cardiac device: Secondary | ICD-10-CM | POA: Diagnosis not present

## 2017-12-18 DIAGNOSIS — I4891 Unspecified atrial fibrillation: Secondary | ICD-10-CM | POA: Diagnosis not present

## 2017-12-23 ENCOUNTER — Telehealth: Payer: Self-pay | Admitting: Family Medicine

## 2017-12-23 ENCOUNTER — Ambulatory Visit (INDEPENDENT_AMBULATORY_CARE_PROVIDER_SITE_OTHER): Payer: Medicare Other | Admitting: Medical

## 2017-12-23 ENCOUNTER — Encounter: Payer: Self-pay | Admitting: Medical

## 2017-12-23 VITALS — BP 133/52 | HR 77 | Temp 98.2°F | Resp 16 | Ht 68.0 in | Wt 239.4 lb

## 2017-12-23 DIAGNOSIS — J40 Bronchitis, not specified as acute or chronic: Secondary | ICD-10-CM | POA: Diagnosis not present

## 2017-12-23 DIAGNOSIS — R05 Cough: Secondary | ICD-10-CM

## 2017-12-23 DIAGNOSIS — J029 Acute pharyngitis, unspecified: Secondary | ICD-10-CM | POA: Diagnosis not present

## 2017-12-23 DIAGNOSIS — R059 Cough, unspecified: Secondary | ICD-10-CM

## 2017-12-23 LAB — POCT RAPID STREP A (OFFICE): RAPID STREP A SCREEN: NEGATIVE

## 2017-12-23 MED ORDER — CEFDINIR 300 MG PO CAPS: 300.0000 mg | ORAL_CAPSULE | Freq: Two times a day (BID) | ORAL | 0 refills | Status: DC

## 2017-12-23 MED ORDER — HYDROCODONE-HOMATROPINE 5-1.5 MG/5ML PO SYRP: 5.0000 mL | ORAL_SOLUTION | Freq: Three times a day (TID) | ORAL | 0 refills | Status: DC | PRN

## 2017-12-23 NOTE — Patient Instructions (Signed)
Your strep test was negative. However, your physical exam and clinical presentation is  moderately suspicious for strep and it is important to note that rapid strep test can be falsely negative. So I am going to give you cefdinir antibiotic today based on your exam and clinical presentation.  Rest hydrate, tylenol for fever, and warm salt water gargles.    Also had some concern for bronchitis.  Cefdinir has good coverage for lungs as well as for throat infection.(Reasonable choice in light of your current condition and allergy medication list.)  For severe cough, I am prescribing Hycodan.  If severe cough persists or worsening chest congestion symptoms then notify us and would recommend getting chest x-ray.  Follow-up in 7-10 days or as needed.

## 2017-12-23 NOTE — Progress Notes (Signed)
Subjective:    Patient ID: Alexandra Patterson, female    DOB: 28-Feb-1956, 62 y.o.   MRN: [id number]  HPI   Pt in with bad sore throat for 3-4 days. Not getting better. Hurts to swallow her saliva. Pt has grandchildren. Recently they visited and typically she gets sick after exposure to them. Sore neck lymph nodes. Has fatigue.  Pt states coughing a lot. She cough so much will gag/dry cough. Trouble sleeping due to cough.  She is not wheezing.  No fever and states rare for her to have fever.  Pt not reporting any sneezing or itchy eyes.   Review of Systems  Constitutional: Positive for fatigue. Negative for chills, diaphoresis and fever.  HENT: Positive for congestion and sore throat. Negative for ear pain, sinus pressure, sinus pain and trouble swallowing.   Respiratory: Positive for cough. Negative for chest tightness, shortness of breath and wheezing.   Cardiovascular: Negative for chest pain and palpitations.  Gastrointestinal: Negative for abdominal pain, diarrhea and nausea.  Musculoskeletal: Negative for back pain.  Skin: Negative for rash.  Neurological: Negative for dizziness, weakness, light-headedness, numbness and headaches.  Hematological: Negative for adenopathy. Does not bruise/bleed easily.  Psychiatric/Behavioral: Negative for behavioral problems and confusion. The patient is not nervous/anxious.    Past Medical History:  Diagnosis Date  . Allergy   . Anxiety   . Atrial fibrillation ([place])   . Cervical cancer screening 09/07/2015   Menarche at 11 Irregular and heavy and painful flow secondary to fibroids No history of abnormal pap in past, last pap roughly 2003 G2P2, s/p 2 SVD history of abnormal MGM, 1 abnl bx right breast benign, normal otherwise Noconcerns today TAH b/l SPO for fibroids and migrainesand breast bx on right gyn surgeries  . Cough 08/13/2015  . Depression   . Depression with anxiety 06/29/2016  . Fainting    once to due heart out of rythm  .  Fibromyalgia   . Frequent headaches   . GERD (gastroesophageal reflux disease)   . 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 History of chicken pox   . Hx of blood clots    dvt  . Hyperlipidemia   . Hyperplastic colon polyp   . Hypertension   . Insomnia 08/13/2015  . Internal hemorrhoids   . Lesion of lung    xray in 2014 thought to be benign seen at [facility] pulmonology  . Measles    h/o  . Migraine   . Obesity   . Preventative health care 02/03/2017  . S/P AVR (aortic valve replacement) 03/19/2015  . Shoulder pain 06/03/2017  . UTI (lower urinary tract infection)   . UTI (urinary tract infection) 02/11/2016     Social History   Socioeconomic History  . Marital status: Married    Spouse name: Not on file  . Number of children: 2  . Years of education: Not on file  . Highest education level: Not on file  Occupational History  . Occupation: Best boy: [employer]  Social Needs  . Financial resource strain: Not on file  . Food insecurity:    Worry: Not on file    Inability: Not on file  . Transportation needs:    Medical: Not on file    Non-medical: Not on file  Tobacco Use  . Smoking status: Never Smoker  . Smokeless tobacco: Never Used  Substance and Sexual Activity  . Alcohol use: No  . Drug use: No  .  Sexual activity: Yes    Comment: lives with husband and adult son, no major dietary restrictions, full diability  Lifestyle  . Physical activity:    Days per week: Not on file    Minutes per session: Not on file  . Stress: Not on file  Relationships  . Social connections:    Talks on phone: Not on file    Gets together: Not on file    Attends religious service: Not on file    Active member of club or organization: Not on file    Attends meetings of clubs or organizations: Not on file    Relationship status: Not on file  . Intimate partner violence:    Fear of current or ex partner: Not on file    Emotionally abused: Not on file     Physically abused: Not on file    Forced sexual activity: Not on file  Other Topics Concern  . Not on file  Social History Narrative  . Not on file    Past Surgical History:  Procedure Laterality Date  . ABDOMINAL HYSTERECTOMY     TAH SPO  . ATRIAL ABLATION SURGERY    . BREAST BIOPSY Right    Needle Biopsy  . DILATION AND CURETTAGE OF UTERUS     x 3  . loop heart    . SHOULDER SURGERY Left    arthroscopy for spurs  . TONSILLECTOMY    . TUBAL LIGATION      Family History  Problem Relation Age of Onset  . Colon polyps Father   . Alzheimer's disease Father   . Alcohol abuse Father   . Arthritis Mother        s/p TKR  . Neuropathy Mother   . Hyperlipidemia Mother   . Other Mother        familial mediterranean fever  . Thyroid disease Mother   . Cancer Brother        prostate cancer  . Heart disease Brother        cardiomegaly  . Other Brother        familial mediterranean fever  . Asthma Maternal Grandmother   . Congestive Heart Failure Maternal Grandmother   . Heart disease Maternal Grandmother        chf  . Stroke Maternal Grandfather   . Diabetes Maternal Grandfather   . Heart disease Maternal Grandfather        hardening of the arteries  . Stroke Paternal Grandfather   . Atrial fibrillation Paternal Grandfather   . Alcohol abuse Paternal Grandfather   . Heart disease Paternal Grandfather        afib  . Interstitial cystitis Daughter   . Arthritis Son   . Alcohol abuse Son   . Mental illness Son        depression  . Colon cancer Unknown        parent  . Arthritis Paternal Uncle     Allergies  Allergen Reactions  . Ciprofloxacin Rash and Shortness Of Breath  . Sulfa Antibiotics Hives  . Sulfasalazine Hives  . Cortisone Other (See Comments)    Red flush and anxiety  . Tramadol Other (See Comments)    headache  . Zonisamide Other (See Comments)    unk  . Erythromycin Rash  . Erythromycin Base Rash  . Levofloxacin Nausea Only  . Lisinopril      Other reaction(s): Cough (ALLERGY/intolerance)  . Prednisone Rash and Anxiety    Current Outpatient Medications on  File Prior to Visit  Medication Sig Dispense Refill  . busPIRone (BUSPAR) 10 MG tablet Take 1 tablet (10 mg total) by mouth 3 (three) times daily. 90 tablet 1  . busPIRone (BUSPAR) 7.5 MG tablet TAKE 1 TABLET(7.5 MG) BY MOUTH THREE TIMES DAILY 90 tablet 3  . butalbital-acetaminophen-caffeine (FIORICET, ESGIC) 50-325-40 MG tablet Take 1 tablet by mouth 2 (two) times daily as needed for headache. 30 tablet 1  . DULoxetine (CYMBALTA) 60 MG capsule TAKE 1 CAPSULE(60 MG) BY MOUTH TWICE DAILY 60 capsule 3  . esomeprazole (NEXIUM) 40 MG capsule TAKE 1 CAPSULE(40 MG) BY MOUTH TWICE DAILY BEFORE A MEAL 60 capsule 3  . hydrOXYzine (ATARAX/VISTARIL) 25 MG tablet Take 25 mg by mouth daily.    Marland Kitchen KRILL OIL PO Take 1 mg by mouth daily. Reported on 02/13/2016    . levothyroxine (SYNTHROID, LEVOTHROID) 125 MCG tablet TAKE 1 TABLET(125 MCG) BY MOUTH DAILY 30 tablet 0  . levothyroxine (SYNTHROID, LEVOTHROID) 125 MCG tablet Take 1 tablet (125 mcg total) by mouth daily. 90 tablet 1  . losartan (COZAAR) 100 MG tablet Take 1 tablet (100 mg total) by mouth daily. 30 tablet 3  . Magnesium 400 MG CAPS Take 400 mg by mouth daily. Reported on 02/13/2016    . meloxicam (MOBIC) 15 MG tablet TAKE 1 TABLET(15 MG) BY MOUTH DAILY AS NEEDED FOR PAIN 30 tablet 5  . metoprolol succinate (TOPROL-XL) 100 MG 24 hr tablet TAKE 1 TABLET BY MOUTH TWICE DAILY TAKE WITH OR IMMEDIATELY FOLLOWING A MEAL 60 tablet 6  . metoprolol succinate (TOPROL-XL) 50 MG 24 hr tablet TAKE 1 TABLET BY MOUTH TWICE DAILY WITH A MEAL OR IMMEDIATELY FOLLOWING A MEAL 180 tablet 0  . nitrofurantoin, macrocrystal-monohydrate, (MACROBID) 100 MG capsule Take 1 capsule (100 mg total) by mouth 2 (two) times daily. 14 capsule 0  . ondansetron (ZOFRAN) 4 MG tablet TAKE 1 TABLET BY MOUTH EVERY 6 TO 8 HOURS AS NEEDED 30 tablet 2  . pentosan polysulfate  (ELMIRON) 100 MG capsule Take 200 mg by mouth 2 (two) times daily.    . rizatriptan (MAXALT) 10 MG tablet Take 10 mg by mouth as needed for migraine. May repeat in 2 hours if needed    . simvastatin (ZOCOR) 40 MG tablet Take 1 tablet (40 mg total) by mouth at bedtime. 90 tablet 1  . topiramate (TOPAMAX) 50 MG tablet Take 1 tablet (50 mg total) by mouth daily. Takes 75 mg daily 45 tablet 3  . triamcinolone (NASACORT ALLERGY 24HR) 55 MCG/ACT AERO nasal inhaler Place 2 sprays into the nose 2 (two) times daily.    Marland Kitchen zolpidem (AMBIEN CR) 12.5 MG CR tablet Take 1 tablet (12.5 mg total) by mouth at bedtime as needed. for sleep 30 tablet 2   No current facility-administered medications on file prior to visit.     BP (!) 133/52 (BP Location: Left Arm, Patient Position: Sitting, Cuff Size: Large)   Pulse 77   Temp 98.2 F (36.8 C) (Oral)   Resp 16   Ht 5\' 8"  (1.727 m)   Wt 239 lb 6.4 oz (108.6 kg)   SpO2 99%   BMI 36.40 kg/m       Objective:   Physical Exam  General  Mental Status - Alert. General Appearance - Well groomed. Not in acute distress.  Skin Rashes- No Rashes.  HEENT Head- Normal. Ear Auditory Canal - Left- Normal. Right - Normal.Tympanic Membrane- Left- Normal. Right- Normal. Eye Sclera/Conjunctiva- Left- Normal. Right-  Normal. Nose & Sinuses Nasal Mucosa- Left-  Boggy and Congested. Right-  Boggy and  Congested.Bilateral  No maxillary and no  frontal sinus pressure. Mouth & Throat Lips: Upper Lip- Normal: no dryness, cracking, pallor, cyanosis, or vesicular eruption. Lower Lip-Normal: no dryness, cracking, pallor, cyanosis or vesicular eruption. Buccal Mucosa- Bilateral- No Aphthous ulcers. Oropharynx- No Discharge or Erythema. Tonsils: Characteristics- Bilateral- Erythema moderate size/Enlargement- Bilateral- No enlargement. Discharge- bilateral-None.  Neck Neck- Supple. No Masses.  Mild faintly enlarged submandibular nodes.  No obvious tenderness.   Chest and  Lung Exam Auscultation: Breath Sounds:-Clear even and unlabored.(But moderate harsh sounding cough intermittently during the exam.)  Cardiovascular Auscultation:Rythm- Regular, rate and rhythm. Murmurs & Other Heart Sounds:Ausculatation of the heart reveal- No Murmurs.  Lymphatic Head & Neck General Head & Neck Lymphatics: Bilateral: Description-see neck exam.      Assessment & Plan:  Your strep test was negative. However, your physical exam and clinical presentation is  moderately suspicious for strep and it is important to note that rapid strep test can be falsely negative. So I am going to give you cefdinir antibiotic today based on your exam and clinical presentation.  Rest hydrate, tylenol for fever, and warm salt water gargles.    Also had some concern for bronchitis.  Cefdinir has good coverage for lungs as well as for throat infection.(Reasonable choice in light of your current condition and allergy medication list.)  For severe cough, I am prescribing Hycodan.  If severe cough persists or worsening chest congestion symptoms then notify us and would recommend getting chest x-ray.  Follow-up in 7-10 days or as needed.  Mackie Pai, PA-C

## 2017-12-30 DIAGNOSIS — N3010 Interstitial cystitis (chronic) without hematuria: Secondary | ICD-10-CM | POA: Diagnosis not present

## 2017-12-30 DIAGNOSIS — M797 Fibromyalgia: Secondary | ICD-10-CM | POA: Diagnosis not present

## 2018-01-05 ENCOUNTER — Other Ambulatory Visit: Payer: Self-pay | Admitting: Family Medicine

## 2018-01-08 ENCOUNTER — Ambulatory Visit (INDEPENDENT_AMBULATORY_CARE_PROVIDER_SITE_OTHER): Payer: Medicare Other | Admitting: Family Medicine

## 2018-01-08 VITALS — BP 130/82 | HR 72 | Temp 98.3°F | Resp 18 | Wt 236.0 lb

## 2018-01-08 DIAGNOSIS — E785 Hyperlipidemia, unspecified: Secondary | ICD-10-CM | POA: Diagnosis not present

## 2018-01-08 DIAGNOSIS — E669 Obesity, unspecified: Secondary | ICD-10-CM | POA: Diagnosis not present

## 2018-01-08 DIAGNOSIS — G43809 Other migraine, not intractable, without status migrainosus: Secondary | ICD-10-CM | POA: Diagnosis not present

## 2018-01-08 DIAGNOSIS — R739 Hyperglycemia, unspecified: Secondary | ICD-10-CM | POA: Diagnosis not present

## 2018-01-08 DIAGNOSIS — I10 Essential (primary) hypertension: Secondary | ICD-10-CM | POA: Diagnosis not present

## 2018-01-08 LAB — HEMOGLOBIN A1C: HEMOGLOBIN A1C: 5.5 % (ref 4.6–6.5)

## 2018-01-08 LAB — COMPREHENSIVE METABOLIC PANEL
ALBUMIN: 4.1 g/dL (ref 3.5–5.2)
ALK PHOS: 61 U/L (ref 39–117)
ALT: 12 U/L (ref 0–35)
AST: 13 U/L (ref 0–37)
BILIRUBIN TOTAL: 0.4 mg/dL (ref 0.2–1.2)
BUN: 11 mg/dL (ref 6–23)
CO2: 24 mEq/L (ref 19–32)
CREATININE: 0.87 mg/dL (ref 0.40–1.20)
Calcium: 9.3 mg/dL (ref 8.4–10.5)
Chloride: 104 mEq/L (ref 96–112)
GFR: 69.96 mL/min (ref >60.00)
GLUCOSE: 94 mg/dL (ref 70–99)
Potassium: 3.9 mEq/L (ref 3.5–5.1)
Sodium: 135 mEq/L (ref 135–145)
TOTAL PROTEIN: 6.4 g/dL (ref 6.0–8.3)

## 2018-01-08 LAB — LIPID PANEL
CHOLESTEROL: 185 mg/dL (ref 0–200)
HDL: 62.3 mg/dL (ref >39.00)
LDL Cholesterol: 102 mg/dL — ABNORMAL HIGH (ref 0–99)
NonHDL: 122.56
Total CHOL/HDL Ratio: 3
Triglycerides: 103 mg/dL (ref 0.0–149.0)
VLDL: 20.6 mg/dL (ref 0.0–40.0)

## 2018-01-08 LAB — CBC
HEMATOCRIT: 42.1 % (ref 36.0–46.0)
Hemoglobin: 14.1 g/dL (ref 12.0–15.0)
MCHC: 33.6 g/dL (ref 30.0–36.0)
MCV: 89.1 fl (ref 78.0–100.0)
Platelets: 264 10*3/uL (ref 150.0–400.0)
RBC: 4.72 Mil/uL (ref 3.87–5.11)
RDW: 14.5 % (ref 11.5–15.5)
WBC: 6.7 10*3/uL (ref 4.0–10.5)

## 2018-01-08 LAB — TSH: TSH: 0.19 u[IU]/mL — AB (ref 0.35–4.50)

## 2018-01-08 MED ORDER — KETOROLAC TROMETHAMINE 30 MG/ML IJ SOLN: 30.0000 mg | Freq: Once | INTRAMUSCULAR | Status: AC

## 2018-01-08 MED ORDER — PROMETHAZINE HCL 25 MG PO TABS: 25.0000 mg | ORAL_TABLET | Freq: Three times a day (TID) | ORAL | 0 refills | Status: DC | PRN

## 2018-01-08 NOTE — Assessment & Plan Note (Signed)
Encouraged heart healthy diet, increase exercise, avoid trans fats, consider a krill oil cap daily 

## 2018-01-08 NOTE — Patient Instructions (Signed)
Migraine Headache A migraine headache is an intense, throbbing pain on one side or both sides of the head. Migraines may also cause other symptoms, such as nausea, vomiting, and sensitivity to light and noise. What are the causes? Doing or taking certain things may also trigger migraines, such as:  Alcohol.  Smoking.  Medicines, such as: ? Medicine used to treat chest pain (nitroglycerine). ? Birth control pills. ? Estrogen pills. ? Certain blood pressure medicines.  Aged cheeses, chocolate, or caffeine.  Foods or drinks that contain nitrates, glutamate, aspartame, or tyramine.  Physical activity.  Other things that may trigger a migraine include:  Menstruation.  Pregnancy.  Hunger.  Stress, lack of sleep, too much sleep, or fatigue.  Weather changes.  What increases the risk? The following factors may make you more likely to experience migraine headaches:  Age. Risk increases with age.  Family history of migraine headaches.  Being Caucasian.  Depression and anxiety.  Obesity.  Being a woman.  Having a hole in the heart (patent foramen ovale) or other heart problems.  What are the signs or symptoms? The main symptom of this condition is pulsating or throbbing pain. Pain may:  Happen in any area of the head, such as on one side or both sides.  Interfere with daily activities.  Get worse with physical activity.  Get worse with exposure to bright lights or loud noises.  Other symptoms may include:  Nausea.  Vomiting.  Dizziness.  General sensitivity to bright lights, loud noises, or smells.  Before you get a migraine, you may get warning signs that a migraine is developing (aura). An aura may include:  Seeing flashing lights or having blind spots.  Seeing bright spots, halos, or zigzag lines.  Having tunnel vision or blurred vision.  Having numbness or a tingling feeling.  Having trouble talking.  Having muscle weakness.  How is this  diagnosed? A migraine headache can be diagnosed based on:  Your symptoms.  A physical exam.  Tests, such as CT scan or MRI of the head. These imaging tests can help rule out other causes of headaches.  Taking fluid from the spine (lumbar puncture) and analyzing it (cerebrospinal fluid analysis, or CSF analysis).  How is this treated? A migraine headache is usually treated with medicines that:  Relieve pain.  Relieve nausea.  Prevent migraines from coming back.  Treatment may also include:  Acupuncture.  Lifestyle changes like avoiding foods that trigger migraines.  Follow these instructions at home: Medicines  Take over-the-counter and prescription medicines only as told by your health care provider.  Do not drive or use heavy machinery while taking prescription pain medicine.  To prevent or treat constipation while you are taking prescription pain medicine, your health care provider may recommend that you: ? Drink enough fluid to keep your urine clear or pale yellow. ? Take over-the-counter or prescription medicines. ? Eat foods that are high in fiber, such as fresh fruits and vegetables, whole grains, and beans. ? Limit foods that are high in fat and processed sugars, such as fried and sweet foods. Lifestyle  Avoid alcohol use.  Do not use any products that contain nicotine or tobacco, such as cigarettes and e-cigarettes. If you need help quitting, ask your health care provider.  Get at least 8 hours of sleep every night.  Limit your stress. General instructions   Keep a journal to find out what may trigger your migraine headaches. For example, write down: ? What you eat and   drink. ? How much sleep you get. ? Any change to your diet or medicines.  If you have a migraine: ? Avoid things that make your symptoms worse, such as bright lights. ? It may help to lie down in a dark, quiet room. ? Do not drive or use heavy machinery. ? Ask your health care provider  what activities are safe for you while you are experiencing symptoms.  Keep all follow-up visits as told by your health care provider. This is important. Contact a health care provider if:  You develop symptoms that are different or more severe than your usual migraine symptoms. Get help right away if:  Your migraine becomes severe.  You have a fever.  You have a stiff neck.  You have vision loss.  Your muscles feel weak or like you cannot control them.  You start to lose your balance often.  You develop trouble walking.  You faint. This information is not intended to replace advice given to you by your health care provider. Make sure you discuss any questions you have with your health care provider. Document Released: 09/16/2005 Document Revised: 04/05/2016 Document Reviewed: 03/04/2016 Elsevier Interactive Patient Education  2017 [employer] Inc.   

## 2018-01-08 NOTE — Assessment & Plan Note (Signed)
>>  ASSESSMENT AND PLAN FOR CLASS 2 SEVERE OBESITY WITH SERIOUS COMORBIDITY AND BODY MASS INDEX (BMI) OF 36.0 TO 36.9 IN ADULT, UNSPECIFIED OBESITY TYPE (HCC) WRITTEN ON 01/08/2018  1:21 PM BY BLYTH, STACEY A, MD  Encouraged DASH diet, decrease po intake and increase exercise as tolerated. Needs 7-8 hours of sleep nightly. Avoid trans fats, eat small, frequent meals every 4-5 hours with lean proteins, complex carbs and healthy fats. Minimize simple carbs, GMO foods. Is on the first 56 day diet. Doing well with good results

## 2018-01-08 NOTE — Assessment & Plan Note (Signed)
Well controlled, no changes to meds. Encouraged heart healthy diet such as the DASH diet and exercise as tolerated.  °

## 2018-01-08 NOTE — Assessment & Plan Note (Addendum)
Encouraged DASH diet, decrease po intake and increase exercise as tolerated. Needs 7-8 hours of sleep nightly. Avoid trans fats, eat small, frequent meals every 4-5 hours with lean proteins, complex carbs and healthy fats. Minimize simple carbs, GMO foods. Is on the first 56 day diet. Doing well with good results

## 2018-01-08 NOTE — Progress Notes (Signed)
Subjective:  I acted as a Education administrator for Dr. [name]. [place], [place]  Patient ID: Alexandra Patterson, female    DOB: Aug 12, 1956, 62 y.o.   MRN: [id number]  No chief complaint on file.   HPI  Patient is in today for 3 month follow up and her greatest complaint continues to be fatigue, arthralgas and myalgias. She is enjoying her grandkids but this makes it difficult to enjoy at times. No recent febrile illnessor acute hospitalizations. Denies CP/palp/SOB/HA/congestion/fevers/GI or GU c/o. Taking meds as prescribed  Patient Care Team: [name], MD as PCP - General (Family Medicine) Veneda Melter, MD as Referring Physician (Cardiology) Dudley Major, MD as Referring Physician (Internal Medicine) Almyra Deforest, MD as Referring Physician (Cardiothoracic Surgery) Kerin Perna., MD as Referring Physician (Neurology)   Past Medical History:  Diagnosis Date  . Allergy   . Anxiety   . Atrial fibrillation ([place])   . Cervical cancer screening 09/07/2015   Menarche at 11 Irregular and heavy and painful flow secondary to fibroids No history of abnormal pap in past, last pap roughly 2003 G2P2, s/p 2 SVD history of abnormal MGM, 1 abnl bx right breast benign, normal otherwise Noconcerns today TAH b/l SPO for fibroids and migrainesand breast bx on right gyn surgeries  . Cough 08/13/2015  . Depression   . Depression with anxiety 06/29/2016  . Fainting    once to due heart out of rythm  . Fibromyalgia   . Frequent headaches   . GERD (gastroesophageal reflux disease)   . 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 History of chicken pox   . Hx of blood clots    dvt  . Hyperlipidemia   . Hyperplastic colon polyp   . Hypertension   . Insomnia 08/13/2015  . Internal hemorrhoids   . Lesion of lung    xray in 2014 thought to be benign seen at [facility]  . Measles    h/o  . Migraine   . Obesity   . Preventative health care 02/03/2017  . S/P AVR (aortic valve replacement)  03/19/2015  . Shoulder pain 06/03/2017  . UTI (lower urinary tract infection)   . UTI (urinary tract infection) 02/11/2016    Past Surgical History:  Procedure Laterality Date  . ABDOMINAL HYSTERECTOMY     TAH SPO  . ATRIAL ABLATION SURGERY    . BREAST BIOPSY Right    Needle Biopsy  . DILATION AND CURETTAGE OF UTERUS     x 3  . loop heart    . SHOULDER SURGERY Left    arthroscopy for spurs  . TONSILLECTOMY    . TUBAL LIGATION      Family History  Problem Relation Age of Onset  . Colon polyps Father   . Alzheimer's disease Father   . Alcohol abuse Father   . Arthritis Mother        s/p TKR  . Neuropathy Mother   . Hyperlipidemia Mother   . Other Mother        familial mediterranean fever  . Thyroid disease Mother   . Cancer Brother        prostate cancer  . Heart disease Brother        cardiomegaly  . Other Brother        familial mediterranean fever  . Asthma Maternal Grandmother   . Congestive Heart Failure Maternal Grandmother   . Heart disease Maternal Grandmother        chf  . Stroke  Maternal Grandfather   . Diabetes Maternal Grandfather   . Heart disease Maternal Grandfather        hardening of the arteries  . Stroke Paternal Grandfather   . Atrial fibrillation Paternal Grandfather   . Alcohol abuse Paternal Grandfather   . Heart disease Paternal Grandfather        afib  . Interstitial cystitis Daughter   . Arthritis Son   . Alcohol abuse Son   . Mental illness Son        depression  . Colon cancer Unknown        parent  . Arthritis Paternal Uncle     Social History   Socioeconomic History  . Marital status: Married    Spouse name: Not on file  . Number of children: 2  . Years of education: Not on file  . Highest education level: Not on file  Occupational History  . Occupation: Best boy: [employer]  Social Needs  . Financial resource strain: Not on file  . Food insecurity:    Worry: Not on file    Inability: Not  on file  . Transportation needs:    Medical: Not on file    Non-medical: Not on file  Tobacco Use  . Smoking status: Never Smoker  . Smokeless tobacco: Never Used  Substance and Sexual Activity  . Alcohol use: No  . Drug use: No  . Sexual activity: Yes    Comment: lives with husband and adult son, no major dietary restrictions, full diability  Lifestyle  . Physical activity:    Days per week: Not on file    Minutes per session: Not on file  . Stress: Not on file  Relationships  . Social connections:    Talks on phone: Not on file    Gets together: Not on file    Attends religious service: Not on file    Active member of club or organization: Not on file    Attends meetings of clubs or organizations: Not on file    Relationship status: Not on file  . Intimate partner violence:    Fear of current or ex partner: Not on file    Emotionally abused: Not on file    Physically abused: Not on file    Forced sexual activity: Not on file  Other Topics Concern  . Not on file  Social History Narrative  . Not on file    Outpatient Medications Prior to Visit  Medication Sig Dispense Refill  . busPIRone (BUSPAR) 10 MG tablet Take 1 tablet (10 mg total) by mouth 3 (three) times daily. 90 tablet 1  . butalbital-acetaminophen-caffeine (FIORICET, ESGIC) 50-325-40 MG tablet Take 1 tablet by mouth 2 (two) times daily as needed for headache. 30 tablet 1  . DULoxetine (CYMBALTA) 60 MG capsule TAKE 1 CAPSULE(60 MG) BY MOUTH TWICE DAILY 60 capsule 3  . esomeprazole (NEXIUM) 40 MG capsule TAKE 1 CAPSULE(40 MG) BY MOUTH TWICE DAILY BEFORE A MEAL 60 capsule 3  . hydrOXYzine (ATARAX/VISTARIL) 25 MG tablet Take 25 mg by mouth daily.    Marland Kitchen KRILL OIL PO Take 1 mg by mouth daily. Reported on 02/13/2016    . levothyroxine (SYNTHROID, LEVOTHROID) 125 MCG tablet Take 1 tablet (125 mcg total) by mouth daily. 90 tablet 1  . losartan (COZAAR) 100 MG tablet Take 1 tablet (100 mg total) by mouth daily. 30 tablet 3    . Magnesium 400 MG CAPS Take 400 mg  by mouth daily. Reported on 02/13/2016    . meloxicam (MOBIC) 15 MG tablet TAKE 1 TABLET BY MOUTH ONCE DAILY AS NEEDEDF OR PAIN 30 tablet 2  . metoprolol succinate (TOPROL-XL) 100 MG 24 hr tablet TAKE 1 TABLET BY MOUTH TWICE DAILY TAKE WITH OR IMMEDIATELY FOLLOWING A MEAL 60 tablet 6  . metoprolol succinate (TOPROL-XL) 50 MG 24 hr tablet TAKE 1 TABLET BY MOUTH TWICE DAILY WITH A MEAL OR IMMEDIATELY FOLLOWING A MEAL 180 tablet 0  . ondansetron (ZOFRAN) 4 MG tablet TAKE 1 TABLET BY MOUTH EVERY 6 TO 8 HOURS AS NEEDED 30 tablet 2  . rizatriptan (MAXALT) 10 MG tablet Take 10 mg by mouth as needed for migraine. May repeat in 2 hours if needed    . simvastatin (ZOCOR) 40 MG tablet Take 1 tablet (40 mg total) by mouth at bedtime. 90 tablet 1  . topiramate (TOPAMAX) 50 MG tablet Take 1 tablet (50 mg total) by mouth daily. Takes 75 mg daily 45 tablet 3  . triamcinolone (NASACORT ALLERGY 24HR) 55 MCG/ACT AERO nasal inhaler Place 2 sprays into the nose 2 (two) times daily.    Marland Kitchen zolpidem (AMBIEN CR) 12.5 MG CR tablet Take 1 tablet (12.5 mg total) by mouth at bedtime as needed. for sleep 30 tablet 2  . busPIRone (BUSPAR) 7.5 MG tablet TAKE 1 TABLET(7.5 MG) BY MOUTH THREE TIMES DAILY 90 tablet 3  . cefdinir (OMNICEF) 300 MG capsule Take 1 capsule (300 mg total) by mouth 2 (two) times daily. 20 capsule 0  . HYDROcodone-homatropine (HYCODAN) 5-1.5 MG/5ML syrup Take 5 mLs by mouth every 8 (eight) hours as needed for cough. 100 mL 0  . levothyroxine (SYNTHROID, LEVOTHROID) 125 MCG tablet TAKE 1 TABLET(125 MCG) BY MOUTH DAILY 30 tablet 0  . nitrofurantoin, macrocrystal-monohydrate, (MACROBID) 100 MG capsule Take 1 capsule (100 mg total) by mouth 2 (two) times daily. 14 capsule 0  . pentosan polysulfate (ELMIRON) 100 MG capsule Take 200 mg by mouth 2 (two) times daily.     No facility-administered medications prior to visit.     Allergies  Allergen Reactions  . Ciprofloxacin  Rash and Shortness Of Breath  . Sulfa Antibiotics Hives  . Sulfasalazine Hives  . Cortisone Other (See Comments)    Red flush and anxiety  . Tramadol Other (See Comments)    headache  . Zonisamide Other (See Comments)    unk  . Erythromycin Rash  . Erythromycin Base Rash  . Levofloxacin Nausea Only  . Lisinopril     Other reaction(s): Cough (ALLERGY/intolerance)  . Prednisone Rash and Anxiety    Review of Systems  Constitutional: Negative for fever and malaise/fatigue.  HENT: Negative for congestion.   Eyes: Negative for blurred vision.  Respiratory: Negative for shortness of breath.   Cardiovascular: Negative for chest pain, palpitations and leg swelling.  Gastrointestinal: Negative for abdominal pain, blood in stool and nausea.  Genitourinary: Negative for dysuria and frequency.  Musculoskeletal: Negative for falls.  Skin: Negative for rash.  Neurological: Negative for dizziness, loss of consciousness and headaches.  Endo/Heme/Allergies: Negative for environmental allergies.  Psychiatric/Behavioral: Negative for depression. The patient is not nervous/anxious.        Objective:    Physical Exam  Constitutional: She is oriented to person, place, and time. No distress.  HENT:  Head: Normocephalic and atraumatic.  Eyes: Conjunctivae are normal.  Neck: Neck supple. No thyromegaly present.  Cardiovascular: Normal rate, regular rhythm and normal heart sounds.  No murmur heard.  Pulmonary/Chest: Effort normal and breath sounds normal. She has no wheezes.  Abdominal: She exhibits no distension and no mass.  Musculoskeletal: She exhibits no edema.  Lymphadenopathy:    She has no cervical adenopathy.  Neurological: She is alert and oriented to person, place, and time.  Skin: Skin is warm and dry. No rash noted. She is not diaphoretic.  Psychiatric: Judgment normal.    BP 130/82 (BP Location: Left Arm, Patient Position: Sitting, Cuff Size: Normal)   Pulse 72   Temp 98.3 F  (36.8 C) (Oral)   Resp 18   Wt 236 lb (107 kg)   SpO2 98%   BMI 35.88 kg/m  Wt Readings from Last 3 Encounters:  01/08/18 236 lb (107 kg)  12/23/17 239 lb 6.4 oz (108.6 kg)  10/07/17 257 lb 6.4 oz (116.8 kg)   BP Readings from Last 3 Encounters:  01/08/18 130/82  12/23/17 (!) 133/52  10/07/17 (!) 152/92     Immunization History  Administered Date(s) Administered  . Tdap 05/26/2015  . Zoster 05/26/2015    Health Maintenance  Topic Date Due  . Hepatitis C Screening  [birth year]  . HIV Screening  04/24/1970  . COLONOSCOPY  06/03/2017  . INFLUENZA VACCINE  06/09/2018 (Originally 04/30/2018)  . PAP SMEAR  09/06/2018  . MAMMOGRAM  07/25/2019  . TETANUS/TDAP  05/25/2025    Lab Results  Component Value Date   WBC 8.4 10/07/2017   HGB 14.9 10/07/2017   HCT 46.0 10/07/2017   PLT 261.0 10/07/2017   GLUCOSE 108 (H) 10/07/2017   CHOL 234 (H) 10/07/2017   TRIG 117.0 10/07/2017   HDL 76.70 10/07/2017   LDLDIRECT 103.0 06/03/2017   LDLCALC 134 (H) 10/07/2017   ALT 17 10/07/2017   AST 15 10/07/2017   NA 139 10/07/2017   K 4.6 10/07/2017   CL 105 10/07/2017   CREATININE 1.07 10/07/2017   BUN 24 (H) 10/07/2017   CO2 27 10/07/2017   TSH 2.34 10/07/2017    Lab Results  Component Value Date   TSH 2.34 10/07/2017   Lab Results  Component Value Date   WBC 8.4 10/07/2017   HGB 14.9 10/07/2017   HCT 46.0 10/07/2017   MCV 91.4 10/07/2017   PLT 261.0 10/07/2017   Lab Results  Component Value Date   NA 139 10/07/2017   K 4.6 10/07/2017   CO2 27 10/07/2017   GLUCOSE 108 (H) 10/07/2017   BUN 24 (H) 10/07/2017   CREATININE 1.07 10/07/2017   BILITOT 0.5 10/07/2017   ALKPHOS 87 10/07/2017   AST 15 10/07/2017   ALT 17 10/07/2017   PROT 7.0 10/07/2017   ALBUMIN 4.5 10/07/2017   CALCIUM 9.9 10/07/2017   GFR 55.15 (L) 10/07/2017   Lab Results  Component Value Date   CHOL 234 (H) 10/07/2017   Lab Results  Component Value Date   HDL 76.70 10/07/2017   Lab Results    Component Value Date   LDLCALC 134 (H) 10/07/2017   Lab Results  Component Value Date   TRIG 117.0 10/07/2017   Lab Results  Component Value Date   CHOLHDL 3 10/07/2017   No results found for: HGBA1C       Assessment & Plan:   Problem List Items Addressed This Visit    Hyperlipidemia    Encouraged heart healthy diet, increase exercise, avoid trans fats, consider a krill oil cap daily      Relevant Orders   Lipid panel   Hypertension    Well  controlled, no changes to meds. Encouraged heart healthy diet such as the DASH diet and exercise as tolerated.       Relevant Orders   CBC   Comprehensive metabolic panel   TSH   Migraine    Encouraged increased hydration, 64 ounces of clear fluids daily. Minimize alcohol and caffeine. Eat small frequent meals with lean proteins and complex carbs. Avoid high and low blood sugars. Get adequate sleep, 7-8 hours a night. Needs exercise daily preferably in the morning. Follows with neurology but has a bad headache today and has had it for the past 4 days. Given a shot of Toradol and an rx for Phenergan      Relevant Medications   ketorolac (TORADOL) 30 MG/ML injection 30 mg   Obesity    Encouraged DASH diet, decrease po intake and increase exercise as tolerated. Needs 7-8 hours of sleep nightly. Avoid trans fats, eat small, frequent meals every 4-5 hours with lean proteins, complex carbs and healthy fats. Minimize simple carbs, GMO foods. Is on the first 56 day diet. Doing well with good results       Other Visit Diagnoses    Hyperglycemia    -  Primary   Relevant Orders   Hemoglobin A1c      I have discontinued Burnett Corrente. Pascoe's nitrofurantoin (macrocrystal-monohydrate), pentosan polysulfate, cefdinir, and HYDROcodone-homatropine. I am also having her start on promethazine. Additionally, I am having her maintain her ondansetron, rizatriptan, triamcinolone, KRILL OIL PO, Magnesium, butalbital-acetaminophen-caffeine, zolpidem,  hydrOXYzine, DULoxetine, esomeprazole, levothyroxine, losartan, metoprolol succinate, topiramate, busPIRone, simvastatin, metoprolol succinate, and meloxicam. We will continue to administer ketorolac.  Meds ordered this encounter  Medications  . promethazine (PHENERGAN) 25 MG tablet    Sig: Take 1 tablet (25 mg total) by mouth every 8 (eight) hours as needed for nausea or vomiting.    Dispense:  20 tablet    Refill:  0  . ketorolac (TORADOL) 30 MG/ML injection 30 mg    CMA served as scribe during this visit. History, Physical and Plan performed by medical provider. Documentation and orders reviewed and attested to.  Penni Homans, MD

## 2018-01-08 NOTE — Assessment & Plan Note (Signed)
Encouraged increased hydration, 64 ounces of clear fluids daily. Minimize alcohol and caffeine. Eat small frequent meals with lean proteins and complex carbs. Avoid high and low blood sugars. Get adequate sleep, 7-8 hours a night. Needs exercise daily preferably in the morning. Follows with neurology but has a bad headache today and has had it for the past 4 days. Given a shot of Toradol and an rx for Phenergan

## 2018-01-09 ENCOUNTER — Other Ambulatory Visit (INDEPENDENT_AMBULATORY_CARE_PROVIDER_SITE_OTHER): Payer: Medicare Other

## 2018-01-09 DIAGNOSIS — E079 Disorder of thyroid, unspecified: Secondary | ICD-10-CM | POA: Diagnosis not present

## 2018-01-09 LAB — T4, FREE: Free T4: 0.98 ng/dL (ref 0.60–1.60)

## 2018-01-13 ENCOUNTER — Encounter: Payer: Self-pay | Admitting: Family Medicine

## 2018-01-22 ENCOUNTER — Other Ambulatory Visit: Payer: Self-pay | Admitting: Family Medicine

## 2018-01-28 ENCOUNTER — Other Ambulatory Visit: Payer: Self-pay | Admitting: Family Medicine

## 2018-02-06 ENCOUNTER — Encounter: Payer: Self-pay | Admitting: Family Medicine

## 2018-02-24 DIAGNOSIS — I480 Paroxysmal atrial fibrillation: Secondary | ICD-10-CM | POA: Diagnosis not present

## 2018-02-24 DIAGNOSIS — Z4509 Encounter for adjustment and management of other cardiac device: Secondary | ICD-10-CM | POA: Diagnosis not present

## 2018-02-25 DIAGNOSIS — Z4509 Encounter for adjustment and management of other cardiac device: Secondary | ICD-10-CM | POA: Diagnosis not present

## 2018-02-25 DIAGNOSIS — I4891 Unspecified atrial fibrillation: Secondary | ICD-10-CM | POA: Diagnosis not present

## 2018-03-02 ENCOUNTER — Other Ambulatory Visit: Payer: Self-pay | Admitting: Family Medicine

## 2018-03-03 ENCOUNTER — Encounter: Payer: Self-pay | Admitting: Family Medicine

## 2018-03-03 MED ORDER — BUSPIRONE HCL 10 MG PO TABS: 10.0000 mg | ORAL_TABLET | Freq: Three times a day (TID) | ORAL | 3 refills | Status: DC

## 2018-03-12 ENCOUNTER — Telehealth: Payer: Self-pay | Admitting: Family Medicine

## 2018-03-12 NOTE — Telephone Encounter (Signed)
Copied from [place] [id number]. Topic: Quick Communication - See Telephone Encounter >> Mar 12, 2018 11:10 AM Genella Rife H wrote: CRM for notification. See Telephone encounter for: 03/12/18.  Left voicemail pt appt needs to be rescheduled per pcp.

## 2018-03-22 ENCOUNTER — Encounter: Payer: Self-pay | Admitting: Family Medicine

## 2018-03-23 ENCOUNTER — Other Ambulatory Visit: Payer: Self-pay | Admitting: Family Medicine

## 2018-03-23 DIAGNOSIS — M5441 Lumbago with sciatica, right side: Secondary | ICD-10-CM | POA: Diagnosis not present

## 2018-03-23 DIAGNOSIS — M797 Fibromyalgia: Secondary | ICD-10-CM | POA: Diagnosis not present

## 2018-03-23 DIAGNOSIS — M5442 Lumbago with sciatica, left side: Secondary | ICD-10-CM | POA: Diagnosis not present

## 2018-03-23 DIAGNOSIS — G43009 Migraine without aura, not intractable, without status migrainosus: Secondary | ICD-10-CM | POA: Diagnosis not present

## 2018-03-24 ENCOUNTER — Encounter: Payer: Self-pay | Admitting: Family Medicine

## 2018-03-25 ENCOUNTER — Other Ambulatory Visit: Payer: Self-pay | Admitting: Family Medicine

## 2018-03-25 DIAGNOSIS — E0590 Thyrotoxicosis, unspecified without thyrotoxic crisis or storm: Secondary | ICD-10-CM

## 2018-03-25 MED ORDER — METOPROLOL SUCCINATE ER 50 MG PO TB24: ORAL_TABLET | ORAL | 0 refills | Status: DC

## 2018-03-26 DIAGNOSIS — M19012 Primary osteoarthritis, left shoulder: Secondary | ICD-10-CM | POA: Diagnosis not present

## 2018-03-26 DIAGNOSIS — M25511 Pain in right shoulder: Secondary | ICD-10-CM | POA: Diagnosis not present

## 2018-03-26 DIAGNOSIS — M19011 Primary osteoarthritis, right shoulder: Secondary | ICD-10-CM | POA: Diagnosis not present

## 2018-03-26 DIAGNOSIS — G8929 Other chronic pain: Secondary | ICD-10-CM | POA: Diagnosis not present

## 2018-03-30 ENCOUNTER — Other Ambulatory Visit: Payer: Self-pay | Admitting: Family Medicine

## 2018-04-07 ENCOUNTER — Other Ambulatory Visit: Payer: Self-pay | Admitting: Family Medicine

## 2018-04-09 DIAGNOSIS — M7071 Other bursitis of hip, right hip: Secondary | ICD-10-CM | POA: Diagnosis not present

## 2018-04-09 DIAGNOSIS — M7072 Other bursitis of hip, left hip: Secondary | ICD-10-CM | POA: Diagnosis not present

## 2018-04-15 ENCOUNTER — Encounter: Payer: Self-pay | Admitting: Family Medicine

## 2018-04-16 MED ORDER — BUSPIRONE HCL 10 MG PO TABS: 10.0000 mg | ORAL_TABLET | Freq: Three times a day (TID) | ORAL | 3 refills | Status: DC

## 2018-04-19 ENCOUNTER — Other Ambulatory Visit: Payer: Self-pay | Admitting: Family Medicine

## 2018-04-21 ENCOUNTER — Encounter: Payer: Self-pay | Admitting: Family Medicine

## 2018-04-21 ENCOUNTER — Ambulatory Visit: Payer: Self-pay | Admitting: Family Medicine

## 2018-04-22 MED ORDER — MELOXICAM 15 MG PO TABS: ORAL_TABLET | ORAL | 0 refills | Status: DC

## 2018-04-27 ENCOUNTER — Ambulatory Visit: Payer: Self-pay | Admitting: Family Medicine

## 2018-05-14 ENCOUNTER — Ambulatory Visit (HOSPITAL_BASED_OUTPATIENT_CLINIC_OR_DEPARTMENT_OTHER)
Admission: RE | Admit: 2018-05-14 | Discharge: 2018-05-14 | Disposition: A | Discharge status: Home / Self Care | Payer: Medicare Other | Source: Ambulatory Visit | Attending: Family Medicine | Admitting: Family Medicine

## 2018-05-14 ENCOUNTER — Ambulatory Visit (INDEPENDENT_AMBULATORY_CARE_PROVIDER_SITE_OTHER): Payer: Medicare Other | Admitting: Family Medicine

## 2018-05-14 ENCOUNTER — Encounter: Payer: Self-pay | Admitting: Family Medicine

## 2018-05-14 VITALS — BP 122/78 | HR 69 | Temp 98.7°F | Resp 18 | Ht 68.0 in | Wt 208.4 lb

## 2018-05-14 DIAGNOSIS — R350 Frequency of micturition: Secondary | ICD-10-CM | POA: Diagnosis not present

## 2018-05-14 DIAGNOSIS — Z0000 Encounter for general adult medical examination without abnormal findings: Secondary | ICD-10-CM

## 2018-05-14 DIAGNOSIS — I10 Essential (primary) hypertension: Secondary | ICD-10-CM

## 2018-05-14 DIAGNOSIS — E669 Obesity, unspecified: Secondary | ICD-10-CM

## 2018-05-14 DIAGNOSIS — N3010 Interstitial cystitis (chronic) without hematuria: Secondary | ICD-10-CM | POA: Diagnosis not present

## 2018-05-14 DIAGNOSIS — E785 Hyperlipidemia, unspecified: Secondary | ICD-10-CM

## 2018-05-14 DIAGNOSIS — R5383 Other fatigue: Secondary | ICD-10-CM

## 2018-05-14 DIAGNOSIS — G8929 Other chronic pain: Secondary | ICD-10-CM | POA: Insufficient documentation

## 2018-05-14 DIAGNOSIS — M5442 Lumbago with sciatica, left side: Secondary | ICD-10-CM | POA: Insufficient documentation

## 2018-05-14 DIAGNOSIS — M545 Low back pain: Secondary | ICD-10-CM | POA: Diagnosis not present

## 2018-05-14 DIAGNOSIS — I4891 Unspecified atrial fibrillation: Secondary | ICD-10-CM | POA: Diagnosis not present

## 2018-05-14 LAB — URINALYSIS, ROUTINE W REFLEX MICROSCOPIC
Bilirubin Urine: NEGATIVE
HGB URINE DIPSTICK: NEGATIVE
Nitrite: POSITIVE — AB
RBC / HPF: NONE SEEN (ref 0–?)
Specific Gravity, Urine: 1.015 (ref 1.000–1.030)
Total Protein, Urine: NEGATIVE
Urine Glucose: NEGATIVE
Urobilinogen, UA: 0.2 (ref 0.0–1.0)
pH: 6.5 (ref 5.0–8.0)

## 2018-05-14 LAB — COMPREHENSIVE METABOLIC PANEL
ALT: 11 U/L (ref 0–35)
AST: 13 U/L (ref 0–37)
Albumin: 4.2 g/dL (ref 3.5–5.2)
Alkaline Phosphatase: 57 U/L (ref 39–117)
BILIRUBIN TOTAL: 0.6 mg/dL (ref 0.2–1.2)
BUN: 12 mg/dL (ref 6–23)
CO2: 26 meq/L (ref 19–32)
CREATININE: 0.92 mg/dL (ref 0.40–1.20)
Calcium: 10 mg/dL (ref 8.4–10.5)
Chloride: 102 mEq/L (ref 96–112)
GFR: 65.52 mL/min (ref >60.00)
Glucose, Bld: 91 mg/dL (ref 70–99)
Potassium: 4.5 mEq/L (ref 3.5–5.1)
Sodium: 136 mEq/L (ref 135–145)
TOTAL PROTEIN: 6.3 g/dL (ref 6.0–8.3)

## 2018-05-14 LAB — CBC WITH DIFFERENTIAL/PLATELET
BASOS ABS: 0.1 10*3/uL (ref 0.0–0.1)
Basophils Relative: 0.8 % (ref 0.0–3.0)
EOS ABS: 0.1 10*3/uL (ref 0.0–0.7)
Eosinophils Relative: 1.5 % (ref 0.0–5.0)
HCT: 44.1 % (ref 36.0–46.0)
Hemoglobin: 14.6 g/dL (ref 12.0–15.0)
LYMPHS ABS: 1.6 10*3/uL (ref 0.7–4.0)
Lymphocytes Relative: 22.6 % (ref 12.0–46.0)
MCHC: 33.1 g/dL (ref 30.0–36.0)
MCV: 90.5 fl (ref 78.0–100.0)
Monocytes Absolute: 0.3 10*3/uL (ref 0.1–1.0)
Monocytes Relative: 4.8 % (ref 3.0–12.0)
NEUTROS ABS: 4.8 10*3/uL (ref 1.4–7.7)
Neutrophils Relative %: 70.3 % (ref 43.0–77.0)
PLATELETS: 243 10*3/uL (ref 150.0–400.0)
RBC: 4.87 Mil/uL (ref 3.87–5.11)
RDW: 14.8 % (ref 11.5–15.5)
WBC: 6.9 10*3/uL (ref 4.0–10.5)

## 2018-05-14 LAB — LIPID PANEL
Cholesterol: 184 mg/dL (ref 0–200)
HDL: 64 mg/dL (ref >39.00)
LDL CALC: 101 mg/dL — AB (ref 0–99)
NonHDL: 119.86
TRIGLYCERIDES: 95 mg/dL (ref 0.0–149.0)
Total CHOL/HDL Ratio: 3
VLDL: 19 mg/dL (ref 0.0–40.0)

## 2018-05-14 LAB — TSH: TSH: 0.07 u[IU]/mL — ABNORMAL LOW (ref 0.35–4.50)

## 2018-05-14 LAB — VITAMIN D 25 HYDROXY (VIT D DEFICIENCY, FRACTURES): VITD: 54.29 ng/mL (ref 30.00–100.00)

## 2018-05-14 NOTE — Patient Instructions (Signed)
Preventive Care 40-64 Years, Female Preventive care refers to lifestyle choices and visits with your health care provider that can promote health and wellness. What does preventive care include?  A yearly physical exam. This is also called an annual well check.  Dental exams once or twice a year.  Routine eye exams. Ask your health care provider how often you should have your eyes checked.  Personal lifestyle choices, including: ? Daily care of your teeth and gums. ? Regular physical activity. ? Eating a healthy diet. ? Avoiding tobacco and drug use. ? Limiting alcohol use. ? Practicing safe sex. ? Taking low-dose aspirin daily starting at age 58. ? Taking vitamin and mineral supplements as recommended by your health care provider. What happens during an annual well check? The services and screenings done by your health care provider during your annual well check will depend on your age, overall health, lifestyle risk factors, and family history of disease. Counseling Your health care provider may ask you questions about your:  Alcohol use.  Tobacco use.  Drug use.  Emotional well-being.  Home and relationship well-being.  Sexual activity.  Eating habits.  Work and work Statistician.  Method of birth control.  Menstrual cycle.  Pregnancy history.  Screening You may have the following tests or measurements:  Height, weight, and BMI.  Blood pressure.  Lipid and cholesterol levels. These may be checked every 5 years, or more frequently if you are over 81 years old.  Skin check.  Lung cancer screening. You may have this screening every year starting at age 78 if you have a 30-pack-year history of smoking and currently smoke or have quit within the past 15 years.  Fecal occult blood test (FOBT) of the stool. You may have this test every year starting at age 65.  Flexible sigmoidoscopy or colonoscopy. You may have a sigmoidoscopy every 5 years or a colonoscopy  every 10 years starting at age 30.  Hepatitis C blood test.  Hepatitis B blood test.  Sexually transmitted disease (STD) testing.  Diabetes screening. This is done by checking your blood sugar (glucose) after you have not eaten for a while (fasting). You may have this done every 1-3 years.  Mammogram. This may be done every 1-2 years. Talk to your health care provider about when you should start having regular mammograms. This may depend on whether you have a family history of breast cancer.  BRCA-related cancer screening. This may be done if you have a family history of breast, ovarian, tubal, or peritoneal cancers.  Pelvic exam and Pap test. This may be done every 3 years starting at age 80. Starting at age 36, this may be done every 5 years if you have a Pap test in combination with an HPV test.  Bone density scan. This is done to screen for osteoporosis. You may have this scan if you are at high risk for osteoporosis.  Discuss your test results, treatment options, and if necessary, the need for more tests with your health care provider. Vaccines Your health care provider may recommend certain vaccines, such as:  Influenza vaccine. This is recommended every year.  Tetanus, diphtheria, and acellular pertussis (Tdap, Td) vaccine. You may need a Td booster every 10 years.  Varicella vaccine. You may need this if you have not been vaccinated.  Zoster vaccine. You may need this after age 5.  Measles, mumps, and rubella (MMR) vaccine. You may need at least one dose of MMR if you were born in  1957 or later. You may also need a second dose.  Pneumococcal 13-valent conjugate (PCV13) vaccine. You may need this if you have certain conditions and were not previously vaccinated.  Pneumococcal polysaccharide (PPSV23) vaccine. You may need one or two doses if you smoke cigarettes or if you have certain conditions.  Meningococcal vaccine. You may need this if you have certain  conditions.  Hepatitis A vaccine. You may need this if you have certain conditions or if you travel or work in places where you may be exposed to hepatitis A.  Hepatitis B vaccine. You may need this if you have certain conditions or if you travel or work in places where you may be exposed to hepatitis B.  Haemophilus influenzae type b (Hib) vaccine. You may need this if you have certain conditions.  Talk to your health care provider about which screenings and vaccines you need and how often you need them. This information is not intended to replace advice given to you by your health care provider. Make sure you discuss any questions you have with your health care provider. Document Released: 10/13/2015 Document Revised: 06/05/2016 Document Reviewed: 07/18/2015 Elsevier Interactive Patient Education  2018 Elsevier Inc.  

## 2018-05-14 NOTE — Assessment & Plan Note (Signed)
Hydrate well 

## 2018-05-14 NOTE — Assessment & Plan Note (Signed)
And chilss with weight loss check labs and reevaluate

## 2018-05-14 NOTE — Assessment & Plan Note (Signed)
>>  ASSESSMENT AND PLAN FOR CLASS 2 SEVERE OBESITY WITH SERIOUS COMORBIDITY AND BODY MASS INDEX (BMI) OF 36.0 TO 36.9 IN ADULT, UNSPECIFIED OBESITY TYPE (HCC) WRITTEN ON 05/14/2018 11:29 AM BY BLYTH, STACEY A, MD  Using the Next 56 day diet, has lost 50 pounds. Encouraged increased activity

## 2018-05-14 NOTE — Assessment & Plan Note (Signed)
Tolerating statin, encouraged heart healthy diet, avoid trans fats, minimize simple carbs and saturated fats. Increase exercise as tolerated 

## 2018-05-14 NOTE — Assessment & Plan Note (Deleted)
Patient encouraged to maintain heart healthy diet, regular exercise, adequate sleep. Consider daily probiotics. Take medications as prescribed. Given and reviewed copy of ACP documents from  Secretary of State and encouraged to complete and return 

## 2018-05-14 NOTE — Assessment & Plan Note (Signed)
Using the Next 56 day diet, has lost 50 pounds. Encouraged increased activity

## 2018-05-14 NOTE — Assessment & Plan Note (Signed)
Asymptomatic and is following with cardiology at [facility]

## 2018-05-14 NOTE — Assessment & Plan Note (Signed)
Well controlled, no changes to meds. Encouraged heart healthy diet such as the DASH diet and exercise as tolerated.  °

## 2018-05-14 NOTE — Progress Notes (Signed)
Subjective:  I acted as a Education administrator for Dr. [name]. [place], [place]  Patient ID: Alexandra Patterson, female    DOB: [birth year], 62 y.o.   MRN: [id number]  Chief Complaint  Patient presents with  . Follow-up    HPI  Patient is in today for a follow up visit. She is following up on her HTN, hyperlipidemia and other medical concerns. She recently started a plant base diet, and has lost 50lbs. She has no acute concerns today. No recent febrile illness or acute hospitalizations. Denies CP/palp/SOB/HA/congestion/fevers/GI c/o. Taking meds as prescribed. Her greatest complaint is feeling cold all of the time. No ercent illness or fever. She also notes some numbness in her left foot, most notably in the lateral 3 toes.. It comes and goes and is work with ambulation and prolonged sitting. No fall or injury. Is still managing her activities of daily adequately but it does limit her activity   Patient Care Team: [name], MD as PCP - General (Family Medicine) Veneda Melter, MD as Referring Physician (Cardiology) Dudley Major, MD as Referring Physician (Internal Medicine) Lunette Stands Glori Bickers, MD as Referring Physician (Cardiothoracic Surgery) Kerin Perna., MD as Referring Physician (Neurology)   Past Medical History:  Diagnosis Date  . Allergy   . Anxiety   . Atrial fibrillation ([place])   . Cervical cancer screening 09/07/2015   Menarche at 11 Irregular and heavy and painful flow secondary to fibroids No history of abnormal pap in past, last pap roughly 2003 G2P2, s/p 2 SVD history of abnormal MGM, 1 abnl bx right breast benign, normal otherwise Noconcerns today TAH b/l SPO for fibroids and migrainesand breast bx on right gyn surgeries  . Cough 08/13/2015  . Depression   . Depression with anxiety 06/29/2016  . Fainting    once to due heart out of rythm  . Fibromyalgia   . Frequent headaches   . GERD (gastroesophageal reflux disease)   . 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 History of  chicken pox   . Hx of blood clots    dvt  . Hyperlipidemia   . Hyperplastic colon polyp   . Hypertension   . Insomnia 08/13/2015  . Internal hemorrhoids   . Lesion of lung    xray in 2014 thought to be benign seen at [facility] pulmonology  . Measles    h/o  . Migraine   . Obesity   . Preventative health care 02/03/2017  . S/P AVR (aortic valve replacement) 03/19/2015  . Shoulder pain 06/03/2017  . UTI (lower urinary tract infection)   . UTI (urinary tract infection) 02/11/2016    Past Surgical History:  Procedure Laterality Date  . ABDOMINAL HYSTERECTOMY     TAH SPO  . ATRIAL ABLATION SURGERY    . BREAST BIOPSY Right    Needle Biopsy  . DILATION AND CURETTAGE OF UTERUS     x 3  . loop heart    . SHOULDER SURGERY Left    arthroscopy for spurs  . TONSILLECTOMY    . TUBAL LIGATION      Family History  Problem Relation Age of Onset  . Colon polyps Father   . Alzheimer's disease Father   . Alcohol abuse Father   . Cancer Father        colon cancer  . Arthritis Mother        s/p TKR  . Neuropathy Mother   . Hyperlipidemia Mother   . Other Mother  familial mediterranean fever  . Thyroid disease Mother   . Cancer Brother        prostate cancer  . Heart disease Brother        cardiomegaly  . Other Brother        familial mediterranean fever  . Asthma Maternal Grandmother   . Congestive Heart Failure Maternal Grandmother   . Heart disease Maternal Grandmother        chf  . Stroke Maternal Grandfather   . Diabetes Maternal Grandfather   . Heart disease Maternal Grandfather        hardening of the arteries  . Stroke Paternal Grandfather   . Atrial fibrillation Paternal Grandfather   . Alcohol abuse Paternal Grandfather   . Heart disease Paternal Grandfather        afib  . Interstitial cystitis Daughter   . Arthritis Son   . Alcohol abuse Son        in remission  . Mental illness Son        depression  . Other Son        interstitial cystitis  .  Colon cancer Unknown        parent  . Arthritis Paternal Uncle     Social History   Socioeconomic History  . Marital status: Married    Spouse name: Not on file  . Number of children: 2  . Years of education: Not on file  . Highest education level: Not on file  Occupational History  . Occupation: Best boy: [employer]  Social Needs  . Financial resource strain: Not on file  . Food insecurity:    Worry: Not on file    Inability: Not on file  . Transportation needs:    Medical: Not on file    Non-medical: Not on file  Tobacco Use  . Smoking status: Never Smoker  . Smokeless tobacco: Never Used  Substance and Sexual Activity  . Alcohol use: No  . Drug use: No  . Sexual activity: Yes    Comment: lives with husband and adult son, no major dietary restrictions, full diability  Lifestyle  . Physical activity:    Days per week: Not on file    Minutes per session: Not on file  . Stress: Not on file  Relationships  . Social connections:    Talks on phone: Not on file    Gets together: Not on file    Attends religious service: Not on file    Active member of club or organization: Not on file    Attends meetings of clubs or organizations: Not on file    Relationship status: Not on file  . Intimate partner violence:    Fear of current or ex partner: Not on file    Emotionally abused: Not on file    Physically abused: Not on file    Forced sexual activity: Not on file  Other Topics Concern  . Not on file  Social History Narrative  . Not on file    Outpatient Medications Prior to Visit  Medication Sig Dispense Refill  . busPIRone (BUSPAR) 10 MG tablet Take 1 tablet (10 mg total) by mouth 3 (three) times daily. 90 tablet 3  . butalbital-acetaminophen-caffeine (FIORICET, ESGIC) 50-325-40 MG tablet Take 1 tablet by mouth 2 (two) times daily as needed for headache. 30 tablet 1  . DULoxetine (CYMBALTA) 60 MG capsule TAKE 1 CAPSULE(60 MG) BY MOUTH  TWICE DAILY 60 capsule 3  .  esomeprazole (NEXIUM) 20 MG capsule Take 20 mg by mouth daily at 12 noon.    Marland Kitchen levothyroxine (SYNTHROID, LEVOTHROID) 125 MCG tablet TAKE 1 TABLET BY MOUTH EVERY DAY 90 tablet 1  . losartan (COZAAR) 100 MG tablet TAKE 1 TABLET BY MOUTH EVERY DAY 90 tablet 0  . Magnesium 400 MG CAPS Take 400 mg by mouth daily. Reported on 02/13/2016    . meloxicam (MOBIC) 15 MG tablet TAKE 1 TABLET BY MOUTH ONCE DAILY AS NEEDED FOR PAIN 90 tablet 0  . metoprolol succinate (TOPROL-XL) 100 MG 24 hr tablet TAKE 1 TABLET BY MOUTH TWICE DAILY TAKE WITH OR IMMEDIATELY FOLLOWING A MEAL 60 tablet 6  . metoprolol succinate (TOPROL-XL) 50 MG 24 hr tablet Take with or immediately following a meal. 180 tablet 0  . Misc Natural Products (FIBER 7 PO) Take by mouth.    . ondansetron (ZOFRAN) 4 MG tablet TAKE 1 TABLET BY MOUTH EVERY 6 TO 8 HOURS AS NEEDED 30 tablet 2  . promethazine (PHENERGAN) 25 MG tablet Take 1 tablet (25 mg total) by mouth every 8 (eight) hours as needed for nausea or vomiting. 20 tablet 0  . rizatriptan (MAXALT) 10 MG tablet Take 10 mg by mouth as needed for migraine. May repeat in 2 hours if needed    . simvastatin (ZOCOR) 40 MG tablet TAKE 1 TABLET BY MOUTH EVERYDAY AT BEDTIME 90 tablet 1  . topiramate (TOPAMAX) 50 MG tablet Take 1 tablet (50 mg total) by mouth daily. Takes 75 mg daily 45 tablet 3  . Turmeric Curcumin 500 MG CAPS     . TURMERIC PO Take by mouth.    . esomeprazole (NEXIUM) 40 MG capsule TAKE 1 CAPSULE(40 MG) BY MOUTH TWICE DAILY BEFORE A MEAL (Patient taking differently: 20 mg. TAKE 1 CAPSULE(40 MG) BY MOUTH TWICE DAILY BEFORE A MEAL) 60 capsule 3  . hydrOXYzine (ATARAX/VISTARIL) 25 MG tablet Take 25 mg by mouth daily.    Marland Kitchen KRILL OIL PO Take 1 mg by mouth daily. Reported on 02/13/2016    . triamcinolone (NASACORT ALLERGY 24HR) 55 MCG/ACT AERO nasal inhaler Place 2 sprays into the nose 2 (two) times daily.    Marland Kitchen zolpidem (AMBIEN CR) 12.5 MG CR tablet Take 1 tablet  (12.5 mg total) by mouth at bedtime as needed. for sleep 30 tablet 2   No facility-administered medications prior to visit.     Allergies  Allergen Reactions  . Ciprofloxacin Rash and Shortness Of Breath  . Sulfa Antibiotics Hives  . Sulfasalazine Hives  . Cortisone Other (See Comments)    Red flush and anxiety  . Tramadol Other (See Comments)    headache  . Zonisamide Other (See Comments)    unk  . Erythromycin Rash  . Erythromycin Base Rash  . Levofloxacin Nausea Only  . Lisinopril     Other reaction(s): Cough (ALLERGY/intolerance)  . Prednisone Rash and Anxiety    Review of Systems  Constitutional: Negative for fever and malaise/fatigue.  HENT: Negative for congestion.   Eyes: Negative for blurred vision.  Respiratory: Negative for shortness of breath.   Cardiovascular: Negative for chest pain, palpitations and leg swelling.  Gastrointestinal: Negative for abdominal pain, blood in stool and nausea.  Genitourinary: Positive for frequency. Negative for dysuria.  Musculoskeletal: Negative for falls.  Skin: Negative for rash.  Neurological: Negative for dizziness, loss of consciousness and headaches.  Endo/Heme/Allergies: Negative for environmental allergies.  Psychiatric/Behavioral: Negative for depression. The patient is not nervous/anxious.  Objective:    Physical Exam  Constitutional: She is oriented to person, place, and time. She appears well-developed and well-nourished. No distress.  HENT:  Head: Normocephalic and atraumatic.  Nose: Nose normal.  Eyes: Right eye exhibits no discharge. Left eye exhibits no discharge.  Neck: Normal range of motion. Neck supple.  Cardiovascular: Normal rate and regular rhythm.  No murmur heard. Pulmonary/Chest: Effort normal and breath sounds normal.  Abdominal: Soft. Bowel sounds are normal. There is no tenderness.  Musculoskeletal: She exhibits no edema.  Neurological: She is alert and oriented to person, place, and  time.  Skin: Skin is warm and dry.  Psychiatric: She has a normal mood and affect.  Nursing note and vitals reviewed.   BP 122/78 (BP Location: Left Arm, Patient Position: Sitting, Cuff Size: Normal)   Pulse 69   Temp 98.7 F (37.1 C) (Oral)   Resp 18   Ht 5\' 8"  (1.727 m)   Wt 208 lb 6.4 oz (94.5 kg)   SpO2 98%   BMI 31.69 kg/m  Wt Readings from Last 3 Encounters:  05/14/18 208 lb 6.4 oz (94.5 kg)  01/08/18 236 lb (107 kg)  12/23/17 239 lb 6.4 oz (108.6 kg)   BP Readings from Last 3 Encounters:  05/14/18 122/78  01/08/18 130/82  12/23/17 (!) 133/52     Immunization History  Administered Date(s) Administered  . Tdap 05/26/2015  . Zoster 05/26/2015    Health Maintenance  Topic Date Due  . Hepatitis C Screening  April 18, 1956  . HIV Screening  04/24/1970  . COLONOSCOPY  06/03/2017  . INFLUENZA VACCINE  06/09/2018 (Originally 04/30/2018)  . PAP SMEAR  09/06/2018  . MAMMOGRAM  07/25/2019  . TETANUS/TDAP  05/25/2025    Lab Results  Component Value Date   WBC 6.9 05/14/2018   HGB 14.6 05/14/2018   HCT 44.1 05/14/2018   PLT 243.0 05/14/2018   GLUCOSE 91 05/14/2018   CHOL 184 05/14/2018   TRIG 95.0 05/14/2018   HDL 64.00 05/14/2018   LDLDIRECT 103.0 06/03/2017   LDLCALC 101 (H) 05/14/2018   ALT 11 05/14/2018   AST 13 05/14/2018   NA 136 05/14/2018   K 4.5 05/14/2018   CL 102 05/14/2018   CREATININE 0.92 05/14/2018   BUN 12 05/14/2018   CO2 26 05/14/2018   TSH 0.07 (L) 05/14/2018   HGBA1C 5.5 01/08/2018    Lab Results  Component Value Date   TSH 0.07 (L) 05/14/2018   Lab Results  Component Value Date   WBC 6.9 05/14/2018   HGB 14.6 05/14/2018   HCT 44.1 05/14/2018   MCV 90.5 05/14/2018   PLT 243.0 05/14/2018   Lab Results  Component Value Date   NA 136 05/14/2018   K 4.5 05/14/2018   CO2 26 05/14/2018   GLUCOSE 91 05/14/2018   BUN 12 05/14/2018   CREATININE 0.92 05/14/2018   BILITOT 0.6 05/14/2018   ALKPHOS 57 05/14/2018   AST 13 05/14/2018    ALT 11 05/14/2018   PROT 6.3 05/14/2018   ALBUMIN 4.2 05/14/2018   CALCIUM 10.0 05/14/2018   GFR 65.52 05/14/2018   Lab Results  Component Value Date   CHOL 184 05/14/2018   Lab Results  Component Value Date   HDL 64.00 05/14/2018   Lab Results  Component Value Date   LDLCALC 101 (H) 05/14/2018   Lab Results  Component Value Date   TRIG 95.0 05/14/2018   Lab Results  Component Value Date   CHOLHDL 3 05/14/2018  Lab Results  Component Value Date   HGBA1C 5.5 01/08/2018         Assessment & Plan:   Problem List Items Addressed This Visit    Hyperlipidemia    Tolerating statin, encouraged heart healthy diet, avoid trans fats, minimize simple carbs and saturated fats. Increase exercise as tolerated      Relevant Orders   Lipid panel (Completed)   Hypertension    Well controlled, no changes to meds. Encouraged heart healthy diet such as the DASH diet and exercise as tolerated.       Relevant Orders   CBC with Differential/Platelet (Completed)   Comprehensive metabolic panel (Completed)   TSH (Completed)   VITAMIN D 25 Hydroxy (Vit-D Deficiency, Fractures) (Completed)   Atrial fibrillation (HCC)    Asymptomatic and is following with cardiology at [facility]      Obesity    Using the Next 56 day diet, has lost 50 pounds. Encouraged increased activity      Preventative health care   Relevant Orders   VITAMIN D 25 Hydroxy (Vit-D Deficiency, Fractures) (Completed)   Fatigue    And chilss with weight loss check labs and reevaluate      Relevant Orders   VITAMIN D 25 Hydroxy (Vit-D Deficiency, Fractures) (Completed)   Interstitial cystitis    Hydrate well      Chronic low back pain with left-sided sciatica   Relevant Orders   DG Lumbar Spine Complete (Completed)   Urinary frequency - Primary    Urinalysis and culture reveal UTI, start Cefdinir 300 mg caps 1 twice daily x 5 days      Relevant Orders   Urinalysis   Urine Culture (Completed)   Urinalysis,  Routine w reflex microscopic (Completed)      I have discontinued Burnett Corrente. Dittus's triamcinolone, KRILL OIL PO, zolpidem, hydrOXYzine, and TURMERIC PO. I am also having her maintain her ondansetron, rizatriptan, Magnesium, butalbital-acetaminophen-caffeine, metoprolol succinate, topiramate, promethazine, DULoxetine, simvastatin, metoprolol succinate, levothyroxine, busPIRone, losartan, meloxicam, esomeprazole, Misc Natural Products (FIBER 7 PO), and Turmeric Curcumin.  No orders of the defined types were placed in this encounter.   CMA served as Education administrator during this visit. History, Physical and Plan performed by medical provider. Documentation and orders reviewed and attested to.  Penni Homans, MD

## 2018-05-16 LAB — URINE CULTURE
MICRO NUMBER:: 90971053
SPECIMEN QUALITY: ADEQUATE

## 2018-05-17 ENCOUNTER — Encounter: Payer: Self-pay | Admitting: Family Medicine

## 2018-05-17 DIAGNOSIS — R350 Frequency of micturition: Secondary | ICD-10-CM | POA: Insufficient documentation

## 2018-05-17 DIAGNOSIS — G8929 Other chronic pain: Secondary | ICD-10-CM | POA: Insufficient documentation

## 2018-05-17 DIAGNOSIS — M5442 Lumbago with sciatica, left side: Secondary | ICD-10-CM

## 2018-05-17 NOTE — Assessment & Plan Note (Addendum)
Urinalysis and culture reveal UTI, start Cefdinir 300 mg caps 1 twice daily x 5 days

## 2018-05-18 ENCOUNTER — Other Ambulatory Visit: Payer: Self-pay

## 2018-05-18 ENCOUNTER — Other Ambulatory Visit: Payer: Self-pay | Admitting: Family Medicine

## 2018-05-18 MED ORDER — CEFDINIR 300 MG PO CAPS: 300.0000 mg | ORAL_CAPSULE | Freq: Two times a day (BID) | ORAL | 0 refills | Status: AC

## 2018-05-18 MED ORDER — LEVOTHYROXINE SODIUM 112 MCG PO TABS: 112.0000 ug | ORAL_TABLET | Freq: Every day | ORAL | 1 refills | Status: DC

## 2018-05-18 MED ORDER — DULOXETINE HCL 60 MG PO CPEP: 60.0000 mg | ORAL_CAPSULE | Freq: Two times a day (BID) | ORAL | 1 refills | Status: DC

## 2018-05-19 ENCOUNTER — Other Ambulatory Visit: Payer: Self-pay | Admitting: Family Medicine

## 2018-05-19 DIAGNOSIS — N3010 Interstitial cystitis (chronic) without hematuria: Secondary | ICD-10-CM

## 2018-05-19 DIAGNOSIS — G8929 Other chronic pain: Secondary | ICD-10-CM

## 2018-05-19 DIAGNOSIS — M5442 Lumbago with sciatica, left side: Secondary | ICD-10-CM

## 2018-05-23 ENCOUNTER — Ambulatory Visit (HOSPITAL_BASED_OUTPATIENT_CLINIC_OR_DEPARTMENT_OTHER)
Admission: RE | Admit: 2018-05-23 | Discharge: 2018-05-23 | Disposition: A | Discharge status: Home / Self Care | Payer: Medicare Other | Source: Ambulatory Visit | Attending: Family Medicine | Admitting: Family Medicine

## 2018-05-23 DIAGNOSIS — M47816 Spondylosis without myelopathy or radiculopathy, lumbar region: Secondary | ICD-10-CM | POA: Insufficient documentation

## 2018-05-23 DIAGNOSIS — G8929 Other chronic pain: Secondary | ICD-10-CM | POA: Diagnosis not present

## 2018-05-23 DIAGNOSIS — M5127 Other intervertebral disc displacement, lumbosacral region: Secondary | ICD-10-CM | POA: Diagnosis not present

## 2018-05-23 DIAGNOSIS — M5442 Lumbago with sciatica, left side: Secondary | ICD-10-CM | POA: Insufficient documentation

## 2018-05-23 DIAGNOSIS — M48061 Spinal stenosis, lumbar region without neurogenic claudication: Secondary | ICD-10-CM | POA: Insufficient documentation

## 2018-05-26 DIAGNOSIS — Z4509 Encounter for adjustment and management of other cardiac device: Secondary | ICD-10-CM | POA: Diagnosis not present

## 2018-05-26 DIAGNOSIS — I4891 Unspecified atrial fibrillation: Secondary | ICD-10-CM | POA: Diagnosis not present

## 2018-05-27 ENCOUNTER — Encounter: Payer: Self-pay | Admitting: Family Medicine

## 2018-05-28 ENCOUNTER — Encounter: Payer: Self-pay | Admitting: Family Medicine

## 2018-06-02 ENCOUNTER — Encounter: Payer: Self-pay | Admitting: Family Medicine

## 2018-06-03 DIAGNOSIS — I4891 Unspecified atrial fibrillation: Secondary | ICD-10-CM | POA: Diagnosis not present

## 2018-06-03 DIAGNOSIS — Z4509 Encounter for adjustment and management of other cardiac device: Secondary | ICD-10-CM | POA: Diagnosis not present

## 2018-06-04 ENCOUNTER — Encounter: Payer: Self-pay | Admitting: Family Medicine

## 2018-06-04 DIAGNOSIS — R350 Frequency of micturition: Secondary | ICD-10-CM

## 2018-06-08 NOTE — Telephone Encounter (Signed)
Patient will come in for labs

## 2018-06-15 ENCOUNTER — Other Ambulatory Visit (INDEPENDENT_AMBULATORY_CARE_PROVIDER_SITE_OTHER): Payer: Medicare Other

## 2018-06-15 DIAGNOSIS — R350 Frequency of micturition: Secondary | ICD-10-CM | POA: Diagnosis not present

## 2018-06-15 LAB — URINALYSIS, ROUTINE W REFLEX MICROSCOPIC
Bilirubin Urine: NEGATIVE
HGB URINE DIPSTICK: NEGATIVE
KETONES UR: NEGATIVE
Nitrite: NEGATIVE
PH: 6 (ref 5.0–8.0)
RBC / HPF: NONE SEEN (ref 0–?)
Specific Gravity, Urine: <=1.005 — AB (ref 1.000–1.030)
TOTAL PROTEIN, URINE-UPE24: NEGATIVE
URINE GLUCOSE: NEGATIVE
Urobilinogen, UA: 0.2 (ref 0.0–1.0)

## 2018-06-16 ENCOUNTER — Encounter: Payer: Self-pay | Admitting: Family Medicine

## 2018-06-17 ENCOUNTER — Encounter: Payer: Self-pay | Admitting: Family Medicine

## 2018-06-17 LAB — URINE CULTURE
MICRO NUMBER:: 91107452
SPECIMEN QUALITY:: ADEQUATE

## 2018-06-18 MED ORDER — CEFDINIR 300 MG PO CAPS: 300.0000 mg | ORAL_CAPSULE | Freq: Two times a day (BID) | ORAL | 0 refills | Status: DC

## 2018-06-18 NOTE — Addendum Note (Signed)
Addended by: Wynonia Musty A on: 06/18/2018 09:58 AM   Modules accepted: Orders

## 2018-07-14 ENCOUNTER — Other Ambulatory Visit: Payer: Self-pay | Admitting: Family Medicine

## 2018-07-15 ENCOUNTER — Other Ambulatory Visit: Payer: Self-pay | Admitting: Family Medicine

## 2018-07-16 DIAGNOSIS — G43009 Migraine without aura, not intractable, without status migrainosus: Secondary | ICD-10-CM | POA: Diagnosis not present

## 2018-07-30 DIAGNOSIS — I493 Ventricular premature depolarization: Secondary | ICD-10-CM | POA: Diagnosis not present

## 2018-07-30 DIAGNOSIS — I480 Paroxysmal atrial fibrillation: Secondary | ICD-10-CM | POA: Diagnosis not present

## 2018-07-30 DIAGNOSIS — G43909 Migraine, unspecified, not intractable, without status migrainosus: Secondary | ICD-10-CM | POA: Diagnosis not present

## 2018-07-30 DIAGNOSIS — Z5189 Encounter for other specified aftercare: Secondary | ICD-10-CM | POA: Diagnosis not present

## 2018-07-30 DIAGNOSIS — Z953 Presence of xenogenic heart valve: Secondary | ICD-10-CM | POA: Diagnosis not present

## 2018-07-30 DIAGNOSIS — I350 Nonrheumatic aortic (valve) stenosis: Secondary | ICD-10-CM | POA: Diagnosis not present

## 2018-07-30 DIAGNOSIS — M797 Fibromyalgia: Secondary | ICD-10-CM | POA: Diagnosis not present

## 2018-07-30 DIAGNOSIS — R9431 Abnormal electrocardiogram [ECG] [EKG]: Secondary | ICD-10-CM | POA: Diagnosis not present

## 2018-07-30 DIAGNOSIS — M7072 Other bursitis of hip, left hip: Secondary | ICD-10-CM | POA: Diagnosis not present

## 2018-07-30 DIAGNOSIS — Z79899 Other long term (current) drug therapy: Secondary | ICD-10-CM | POA: Diagnosis not present

## 2018-07-31 DIAGNOSIS — M5441 Lumbago with sciatica, right side: Secondary | ICD-10-CM | POA: Diagnosis not present

## 2018-07-31 DIAGNOSIS — G43009 Migraine without aura, not intractable, without status migrainosus: Secondary | ICD-10-CM | POA: Diagnosis not present

## 2018-07-31 DIAGNOSIS — M5442 Lumbago with sciatica, left side: Secondary | ICD-10-CM | POA: Diagnosis not present

## 2018-07-31 DIAGNOSIS — M797 Fibromyalgia: Secondary | ICD-10-CM | POA: Diagnosis not present

## 2018-08-07 ENCOUNTER — Encounter: Payer: Self-pay | Admitting: Family Medicine

## 2018-08-17 DIAGNOSIS — Q208 Other congenital malformations of cardiac chambers and connections: Secondary | ICD-10-CM | POA: Diagnosis not present

## 2018-08-17 DIAGNOSIS — I480 Paroxysmal atrial fibrillation: Secondary | ICD-10-CM | POA: Diagnosis not present

## 2018-08-17 DIAGNOSIS — Z79899 Other long term (current) drug therapy: Secondary | ICD-10-CM | POA: Diagnosis not present

## 2018-08-17 DIAGNOSIS — Z9889 Other specified postprocedural states: Secondary | ICD-10-CM | POA: Diagnosis not present

## 2018-08-24 ENCOUNTER — Encounter (HOSPITAL_BASED_OUTPATIENT_CLINIC_OR_DEPARTMENT_OTHER): Payer: Self-pay

## 2018-08-24 ENCOUNTER — Other Ambulatory Visit: Payer: Self-pay | Admitting: Family Medicine

## 2018-08-24 ENCOUNTER — Ambulatory Visit (HOSPITAL_BASED_OUTPATIENT_CLINIC_OR_DEPARTMENT_OTHER)
Admission: RE | Admit: 2018-08-24 | Discharge: 2018-08-24 | Disposition: A | Discharge status: Home / Self Care | Payer: Medicare Other | Source: Ambulatory Visit | Attending: Family Medicine | Admitting: Family Medicine

## 2018-08-24 DIAGNOSIS — Z1231 Encounter for screening mammogram for malignant neoplasm of breast: Secondary | ICD-10-CM | POA: Diagnosis not present

## 2018-08-25 DIAGNOSIS — I4891 Unspecified atrial fibrillation: Secondary | ICD-10-CM | POA: Diagnosis not present

## 2018-08-25 DIAGNOSIS — Z4509 Encounter for adjustment and management of other cardiac device: Secondary | ICD-10-CM | POA: Diagnosis not present

## 2018-09-08 ENCOUNTER — Other Ambulatory Visit: Payer: Self-pay | Admitting: Family Medicine

## 2018-09-13 ENCOUNTER — Other Ambulatory Visit: Payer: Self-pay | Admitting: Family Medicine

## 2018-09-13 DIAGNOSIS — E0590 Thyrotoxicosis, unspecified without thyrotoxic crisis or storm: Secondary | ICD-10-CM

## 2018-09-25 ENCOUNTER — Ambulatory Visit (INDEPENDENT_AMBULATORY_CARE_PROVIDER_SITE_OTHER): Payer: Medicare Other | Admitting: Family Medicine

## 2018-09-25 ENCOUNTER — Encounter: Payer: Self-pay | Admitting: Family Medicine

## 2018-09-25 VITALS — BP 120/80 | HR 77 | Temp 98.1°F | Ht 68.0 in | Wt 191.0 lb

## 2018-09-25 DIAGNOSIS — R300 Dysuria: Secondary | ICD-10-CM | POA: Diagnosis not present

## 2018-09-25 DIAGNOSIS — R1030 Lower abdominal pain, unspecified: Secondary | ICD-10-CM | POA: Diagnosis not present

## 2018-09-25 LAB — POC URINALSYSI DIPSTICK (AUTOMATED)
Blood, UA: NEGATIVE
Glucose, UA: NEGATIVE
Ketones, UA: NEGATIVE
Nitrite, UA: NEGATIVE
Protein, UA: POSITIVE — AB
Spec Grav, UA: 1.015 (ref 1.010–1.025)
Urobilinogen, UA: 0.2 E.U./dL
pH, UA: 7 (ref 5.0–8.0)

## 2018-09-25 MED ORDER — CEPHALEXIN 500 MG PO CAPS: 500.0000 mg | ORAL_CAPSULE | Freq: Two times a day (BID) | ORAL | 0 refills | Status: AC

## 2018-09-25 NOTE — Progress Notes (Signed)
Pre visit review using our clinic review tool, if applicable. No additional management support is needed unless otherwise documented below in the visit note. 

## 2018-09-25 NOTE — Progress Notes (Signed)
Chief Complaint  Patient presents with  . Urinary Frequency  . Dysuria    Alexandra Patterson is a 62 y.o. female here for possible UTI.  Duration: 1 week. Symptoms: urinary frequency, dysuria, HA, lower abd pain, , nausea, and back pain Denies: hematuria, urinary hesitancy, fever, vomiting and urinary incontinence, vaginal discharge +hx of IC, unsure if this is that or infection.   ROS:  Constitutional: denies fever GU: As noted in HPI  Past Medical History:  Diagnosis Date  . Allergy   . Anxiety   . Atrial fibrillation ([place])   . Cervical cancer screening 09/07/2015   Menarche at 11 Irregular and heavy and painful flow secondary to fibroids No history of abnormal pap in past, last pap roughly 2003 G2P2, s/p 2 SVD history of abnormal MGM, 1 abnl bx right breast benign, normal otherwise Noconcerns today TAH b/l SPO for fibroids and migrainesand breast bx on right gyn surgeries  . Cough 08/13/2015  . Depression   . Depression with anxiety 06/29/2016  . Fainting    once to due heart out of rythm  . Fibromyalgia   . Frequent headaches   . GERD (gastroesophageal reflux disease)   . 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 History of chicken pox   . Hx of blood clots    dvt  . Hyperlipidemia   . Hyperplastic colon polyp   . Hypertension   . Insomnia 08/13/2015  . Internal hemorrhoids   . Lesion of lung    xray in 2014 thought to be benign seen at [facility] pulmonology  . Measles    h/o  . Migraine   . Obesity   . Preventative health care 02/03/2017  . S/P AVR (aortic valve replacement) 03/19/2015  . Shoulder pain 06/03/2017  . UTI (lower urinary tract infection)   . UTI (urinary tract infection) 02/11/2016    BP 120/80 (BP Location: Left Arm, Patient Position: Sitting, Cuff Size: Normal)   Pulse 77   Temp 98.1 F (36.7 C) (Oral)   Ht 5\' 8"  (1.727 m)   Wt 191 lb (86.6 kg)   SpO2 93%   BMI 29.04 kg/m  General: Awake, alert, appears stated age Heart: RRR Lungs: CTAB, normal  respiratory effort, no accessory muscle usage Abd: BS+, soft, NT, ND, no masses or organomegaly MSK: No CVA tenderness, neg Lloyd's sign Psych: Age appropriate judgment and insight  Dysuria - Plan: POCT Urinalysis Dipstick (Automated), Urine Culture, cephALEXin (KEFLEX) 500 MG capsule  Lower abdominal pain - Plan: POCT Urinalysis Dipstick (Automated), Urine Culture  Cx in process, empirically tx in meantime.  Stay hydrated. Seek immediate care if pt starts to develop fevers, new/worsening symptoms, uncontrollable N/V. F/u prn. The patient voiced understanding and agreement to the plan.  [place], DO 09/25/18 9:39 AM

## 2018-09-25 NOTE — Patient Instructions (Addendum)
Stay hydrated.   Warning signs/symptoms: Uncontrollable nausea/vomiting, fevers, worsening symptoms despite treatment, confusion.  Give [place] around 2 business days to get culture back to you.   If you don't hear anything, assume no change in plan.   Let us know if you need anything.

## 2018-09-26 LAB — URINE CULTURE
MICRO NUMBER:: 91544988
Result:: NO GROWTH
SPECIMEN QUALITY:: ADEQUATE

## 2018-10-14 DIAGNOSIS — N3010 Interstitial cystitis (chronic) without hematuria: Secondary | ICD-10-CM | POA: Diagnosis not present

## 2018-10-14 DIAGNOSIS — M797 Fibromyalgia: Secondary | ICD-10-CM | POA: Diagnosis not present

## 2018-10-15 ENCOUNTER — Other Ambulatory Visit: Payer: Self-pay | Admitting: Family Medicine

## 2018-10-30 ENCOUNTER — Other Ambulatory Visit: Payer: Self-pay | Admitting: Family Medicine

## 2018-11-15 ENCOUNTER — Other Ambulatory Visit: Payer: Self-pay | Admitting: Family Medicine

## 2018-11-17 ENCOUNTER — Ambulatory Visit (INDEPENDENT_AMBULATORY_CARE_PROVIDER_SITE_OTHER): Payer: PPO | Admitting: Family Medicine

## 2018-11-17 VITALS — BP 120/86 | HR 80 | Temp 98.1°F | Resp 18 | Ht 68.0 in | Wt 185.2 lb

## 2018-11-17 DIAGNOSIS — I10 Essential (primary) hypertension: Secondary | ICD-10-CM

## 2018-11-17 DIAGNOSIS — E061 Subacute thyroiditis: Secondary | ICD-10-CM | POA: Diagnosis not present

## 2018-11-17 DIAGNOSIS — E669 Obesity, unspecified: Secondary | ICD-10-CM

## 2018-11-17 DIAGNOSIS — N3010 Interstitial cystitis (chronic) without hematuria: Secondary | ICD-10-CM | POA: Diagnosis not present

## 2018-11-17 DIAGNOSIS — E559 Vitamin D deficiency, unspecified: Secondary | ICD-10-CM

## 2018-11-17 DIAGNOSIS — E785 Hyperlipidemia, unspecified: Secondary | ICD-10-CM

## 2018-11-17 DIAGNOSIS — F418 Other specified anxiety disorders: Secondary | ICD-10-CM | POA: Diagnosis not present

## 2018-11-17 LAB — T4, FREE: Free T4: 1.09 ng/dL (ref 0.60–1.60)

## 2018-11-17 LAB — COMPREHENSIVE METABOLIC PANEL
ALT: 9 U/L (ref 0–35)
AST: 11 U/L (ref 0–37)
Albumin: 4.4 g/dL (ref 3.5–5.2)
Alkaline Phosphatase: 63 U/L (ref 39–117)
BUN: 14 mg/dL (ref 6–23)
CO2: 25 mEq/L (ref 19–32)
Calcium: 9.5 mg/dL (ref 8.4–10.5)
Chloride: 100 mEq/L (ref 96–112)
Creatinine, Ser: 0.92 mg/dL (ref 0.40–1.20)
GFR: 61.54 mL/min (ref >60.00)
Glucose, Bld: 75 mg/dL (ref 70–99)
Potassium: 4.4 mEq/L (ref 3.5–5.1)
SODIUM: 135 meq/L (ref 135–145)
Total Bilirubin: 0.6 mg/dL (ref 0.2–1.2)
Total Protein: 6.5 g/dL (ref 6.0–8.3)

## 2018-11-17 LAB — TSH: TSH: 0.12 u[IU]/mL — AB (ref 0.35–4.50)

## 2018-11-17 LAB — VITAMIN D 25 HYDROXY (VIT D DEFICIENCY, FRACTURES): VITD: 60.75 ng/mL (ref 30.00–100.00)

## 2018-11-17 MED ORDER — HYDROXYZINE HCL 25 MG PO TABS: 25.0000 mg | ORAL_TABLET | Freq: Every evening | ORAL | 0 refills | Status: DC | PRN

## 2018-11-17 NOTE — Assessment & Plan Note (Signed)
>>  ASSESSMENT AND PLAN FOR CLASS 2 SEVERE OBESITY WITH SERIOUS COMORBIDITY AND BODY MASS INDEX (BMI) OF 36.0 TO 36.9 IN ADULT, UNSPECIFIED OBESITY TYPE (HCC) WRITTEN ON 11/17/2018  2:57 PM BY BLYTH, STACEY A, MD  Has lost a great amount on the next 56 day diet. Encouraged to continue the same

## 2018-11-17 NOTE — Assessment & Plan Note (Signed)
Check labs today.

## 2018-11-17 NOTE — Assessment & Plan Note (Signed)
Encouraged heart healthy diet, increase exercise, avoid trans fats, consider a krill oil cap daily 

## 2018-11-17 NOTE — Assessment & Plan Note (Signed)
Well controlled, no changes to meds. Encouraged heart healthy diet such as the DASH diet and exercise as tolerated.  °

## 2018-11-17 NOTE — Patient Instructions (Signed)
Can consider dropping the nexium further and/or adding in Pepcid/Famotidine 10-40 mg daily can take 20mg  at bedtime or 20 mg twice daily as needed Hernia, Adult     A hernia is the bulging of an organ or tissue through a weak spot in the muscles of the abdomen (abdominal wall). Hernias develop most often near the belly button (navel) or the area where the leg meets the lower abdomen (groin). Common types of hernias include:  Incisional hernia. This type bulges through a scar from an abdominal surgery.  Umbilical hernia. This type develops near the navel.  Inguinal hernia. This type develops in the groin or scrotum.  Femoral hernia. This type develops under the groin, in the upper thigh area.  Hiatal hernia. This type occurs when part of the stomach slides above the muscle that separates the abdomen from the chest (diaphragm). What are the causes? This condition may be caused by:  Heavy lifting.  Coughing over a long period of time.  Straining to have a bowel movement. Constipation can lead to straining.  An incision made during an abdominal surgery.  A physical problem that is present at birth (congenital defect).  Being overweight or obese.  Smoking.  Excess fluid in the abdomen.  Undescended testicles in males. What are the signs or symptoms? The main symptom is a skin-colored, rounded bulge in the area of the hernia. However, a bulge may not always be present. It may grow bigger or be more visible when you cough or strain (such as when lifting something heavy). A hernia that can be pushed back into the area (is reducible) rarely causes pain. A hernia that cannot be pushed back into the area (is incarcerated) may lose its blood supply (become strangulated). A hernia that is incarcerated may cause:  Pain.  Fever.  Nausea and vomiting.  Swelling.  Constipation. How is this diagnosed? A hernia may be diagnosed based on:  Your symptoms and medical history.  A  physical exam. Your health care provider may ask you to cough or move in certain ways to see if the hernia becomes visible.  Imaging tests, such as: ? X-rays. ? Ultrasound. ? CT scan. How is this treated? A hernia that is small and painless may not need to be treated. A hernia that is large or painful may be treated with surgery. Inguinal hernias may be treated with surgery to prevent incarceration or strangulation. Strangulated hernias are always treated with surgery because a lack of blood supply to the trapped organ or tissue can cause it to die. Surgery to treat a hernia involves pushing the bulge back into place and repairing the weak area of the muscle or abdominal wall. Follow these instructions at home: Activity  Avoid straining.  Do not lift anything that is heavier than 10 lb (4.5 kg), or the limit that you are told, until your health care provider says that it is safe.  When lifting heavy objects, lift with your leg muscles, not your back muscles. Preventing constipation  Take actions to prevent constipation. Constipation leads to straining with bowel movements, which can make a hernia worse or cause a hernia repair to break down. Your health care provider may recommend that you: ? Drink enough fluid to keep your urine pale yellow. ? Eat foods that are high in fiber, such as fresh fruits and vegetables, whole grains, and beans. ? Limit foods that are high in fat and processed sugars, such as fried or sweet foods. ? Take an over-the-counter  or prescription medicine for constipation. General instructions  When coughing, try to cough gently.  You may try to push the hernia back in place by very gently pressing on it while lying down. Do not try to force the bulge back in if it will not push in easily.  If you are overweight, work with your health care provider to lose weight safely.  Do not use any products that contain nicotine or tobacco, such as cigarettes and e-cigarettes.  If you need help quitting, ask your health care provider.  If you are scheduled for hernia repair, watch your hernia for any changes in shape, size, or color. Tell your health care provider about any changes or new symptoms.  Take over-the-counter and prescription medicines only as told by your health care provider.  Keep all follow-up visits as told by your health care provider. This is important. Contact a health care provider if:  You develop new pain, swelling, or redness around your hernia.  You have signs of constipation, such as: ? Fewer bowel movements in a week than normal. ? Difficulty having a bowel movement. ? Stools that are dry, hard, or larger than normal. Get help right away if:  You have a fever.  You have abdomen pain that gets worse.  You feel nauseous or you vomit.  You cannot push the hernia back in place by very gently pressing on it while lying down. Do not try to force the bulge back in if it will not push in easily.  The hernia: ? Changes in shape, size, or color. ? Feels hard or tender. These symptoms may represent a serious problem that is an emergency. Do not wait to see if the symptoms will go away. Get medical help right away. Call your local emergency services (911 in the [place] Summary  A hernia is the bulging of an organ or tissue through a weak spot in the muscles of the abdomen (abdominal wall).  The main symptom is a skin-colored, rounded lump (bulge) in the hernia area. However, a bulge may not always be present. It may grow bigger or more visible when you cough or strain (such as when having a bowel movement).  A hernia that is small and painless may not need to be treated. A hernia that is large or painful may be treated with surgery.  Surgery to treat a hernia involves pushing the bulge back into place and repairing the weak part of the abdomen. This information is not intended to replace advice given to you by your health care provider.  Make sure you discuss any questions you have with your health care provider. Document Released: 09/16/2005 Document Revised: 06/18/2017 Document Reviewed: 06/18/2017 Elsevier Interactive Patient Education  2019 [employer].

## 2018-11-17 NOTE — Assessment & Plan Note (Signed)
Is under stress due to her son going through his second divorce and his young childeren are with her. Manages well enough at this time

## 2018-11-17 NOTE — Assessment & Plan Note (Addendum)
Has lost a great amount on the next 56 day diet. Encouraged to continue the same

## 2018-11-17 NOTE — Progress Notes (Signed)
Subjective:    Patient ID: Alexandra Patterson, female    DOB: 1956-06-07, 63 y.o.   MRN: [id number]  No chief complaint on file.   HPI Patient is in today for follow-up.  Overall she is doing well but she did have a bad interstitial cystitis flare over the holidays.  Her adult son is currently undergoing his second divorce and is 23-month-old child as well as a 91-year-old child are back with him living at his parents home.  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  She had significant symptoms and went to see her urologist at [facility] and with the addition of amitriptyline and uro-Gesic she is much better.  No fevers or chills no other acute concerns.  Other than the stress she reports her physical health is good and she is pleased with her recent weight loss.  She adheres largely to the 56-day diet with good results. Denies CP/palp/SOB/HA/congestion/fevers/GI or GU c/o. Taking meds as prescribed  Past Medical History:  Diagnosis Date  . Allergy   . Anxiety   . Atrial fibrillation ([place])   . Cervical cancer screening 09/07/2015   Menarche at 11 Irregular and heavy and painful flow secondary to fibroids No history of abnormal pap in past, last pap roughly 2003 G2P2, s/p 2 SVD history of abnormal MGM, 1 abnl bx right breast benign, normal otherwise Noconcerns today TAH b/l SPO for fibroids and migrainesand breast bx on right gyn surgeries  . Cough 08/13/2015  . Depression   . Depression with anxiety 06/29/2016  . Fainting    once to due heart out of rythm  . Fibromyalgia   . Frequent headaches   . GERD (gastroesophageal reflux disease)   . 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 History of chicken pox   . Hx of blood clots    dvt  . Hyperlipidemia   . Hyperplastic colon polyp   . Hypertension   . Insomnia 08/13/2015  . Internal hemorrhoids   . Lesion of lung    xray in 2014 thought to be benign seen at [facility] pulmonology  . Measles    h/o  .  Migraine   . Obesity   . Preventative health care 02/03/2017  . S/P AVR (aortic valve replacement) 03/19/2015  . Shoulder pain 06/03/2017  . UTI (lower urinary tract infection)   . UTI (urinary tract infection) 02/11/2016    Past Surgical History:  Procedure Laterality Date  . ABDOMINAL HYSTERECTOMY     TAH SPO  . ATRIAL ABLATION SURGERY    . BREAST BIOPSY Right    Needle Biopsy  . DILATION AND CURETTAGE OF UTERUS     x 3  . loop heart    . SHOULDER SURGERY Left    arthroscopy for spurs  . TONSILLECTOMY    . TUBAL LIGATION      Family History  Problem Relation Age of Onset  . Colon polyps Father   . Alzheimer's disease Father   . Alcohol abuse Father   . Cancer Father        colon cancer  . Arthritis Mother        s/p TKR  . Neuropathy Mother   . Hyperlipidemia Mother   . Other Mother        familial mediterranean fever  . Thyroid disease Mother   . Cancer Brother        prostate cancer  . Heart disease  Brother        cardiomegaly  . Other Brother        familial mediterranean fever  . Asthma Maternal Grandmother   . Congestive Heart Failure Maternal Grandmother   . Heart disease Maternal Grandmother        chf  . Stroke Maternal Grandfather   . Diabetes Maternal Grandfather   . Heart disease Maternal Grandfather        hardening of the arteries  . Stroke Paternal Grandfather   . Atrial fibrillation Paternal Grandfather   . Alcohol abuse Paternal Grandfather   . Heart disease Paternal Grandfather        afib  . Interstitial cystitis Daughter   . Arthritis Son   . Alcohol abuse Son        in remission  . Mental illness Son        depression  . Other Son        interstitial cystitis  . Colon cancer Unknown        parent  . Arthritis Paternal Uncle     Social History   Socioeconomic History  . Marital status: Married    Spouse name: Not on file  . Number of children: 2  . Years of education: Not on file  . Highest education level: Not on file    Occupational History  . Occupation: Best boy: [employer]  Social Needs  . Financial resource strain: Not on file  . Food insecurity:    Worry: Not on file    Inability: Not on file  . Transportation needs:    Medical: Not on file    Non-medical: Not on file  Tobacco Use  . Smoking status: Never Smoker  . Smokeless tobacco: Never Used  Substance and Sexual Activity  . Alcohol use: No  . Drug use: No  . Sexual activity: Yes    Comment: lives with husband and adult son, no major dietary restrictions, full diability  Lifestyle  . Physical activity:    Days per week: Not on file    Minutes per session: Not on file  . Stress: Not on file  Relationships  . Social connections:    Talks on phone: Not on file    Gets together: Not on file    Attends religious service: Not on file    Active member of club or organization: Not on file    Attends meetings of clubs or organizations: Not on file    Relationship status: Not on file  . Intimate partner violence:    Fear of current or ex partner: Not on file    Emotionally abused: Not on file    Physically abused: Not on file    Forced sexual activity: Not on file  Other Topics Concern  . Not on file  Social History Narrative  . Not on file    Outpatient Medications Prior to Visit  Medication Sig Dispense Refill  . amitriptyline (ELAVIL) 25 MG tablet Take 1 tablet (25 mg total) by mouth at bedtime.    . busPIRone (BUSPAR) 10 MG tablet TAKE 1 TABLET(10 MG) BY MOUTH THREE TIMES DAILY 90 tablet 0  . butalbital-acetaminophen-caffeine (FIORICET, ESGIC) 50-325-40 MG tablet Take 1 tablet by mouth 2 (two) times daily as needed for headache. 30 tablet 1  . DULoxetine (CYMBALTA) 60 MG capsule TAKE 1 CAPSULE (60 MG TOTAL) BY MOUTH 2 (TWO) TIMES DAILY. 180 capsule 1  . esomeprazole (NEXIUM) 20 MG  capsule Take 20 mg by mouth daily at 12 noon.    Marland Kitchen levothyroxine (SYNTHROID, LEVOTHROID) 112 MCG tablet TAKE 1 TABLET BY  MOUTH EVERY DAY 90 tablet 1  . losartan (COZAAR) 100 MG tablet TAKE 1 TABLET BY MOUTH EVERY DAY 90 tablet 0  . Magnesium 400 MG CAPS Take 400 mg by mouth daily. Reported on 02/13/2016    . meloxicam (MOBIC) 15 MG tablet TAKE 1 TABLET BY MOUTH ONCE DAILY AS NEEDED FOR PAIN 90 tablet 0  . Methen-Hyosc-Meth Blue-Na Phos (UROGESIC-BLUE) 81.6 MG TABS Take 1 tablet (81.6 mg total) by mouth 4 (four) times daily as needed.    . metoprolol succinate (TOPROL-XL) 100 MG 24 hr tablet TAKE 1 TABLET BY MOUTH TWICE DAILY TAKE WITH OR IMMEDIATELY FOLLOWING A MEAL 180 tablet 2  . metoprolol succinate (TOPROL-XL) 50 MG 24 hr tablet TAKE 1 TABLET BY MOUTH DAILY WITH OR IMMEDIATELY FOLLOWING A MEAL 90 tablet 1  . Misc Natural Products (FIBER 7 PO) Take by mouth.    . ondansetron (ZOFRAN) 4 MG tablet TAKE 1 TABLET BY MOUTH EVERY 6 TO 8 HOURS AS NEEDED 30 tablet 2  . promethazine (PHENERGAN) 25 MG tablet Take 1 tablet (25 mg total) by mouth every 8 (eight) hours as needed for nausea or vomiting. 20 tablet 0  . rizatriptan (MAXALT) 10 MG tablet Take 10 mg by mouth as needed for migraine. May repeat in 2 hours if needed    . simvastatin (ZOCOR) 40 MG tablet TAKE 1 TABLET BY MOUTH EVERYDAY AT BEDTIME 90 tablet 1  . topiramate (TOPAMAX) 50 MG tablet Take 1 tablet (50 mg total) by mouth daily. Takes 75 mg daily 45 tablet 3  . Turmeric Curcumin 500 MG CAPS     . busPIRone (BUSPAR) 10 MG tablet Take 1 tablet (10 mg total) by mouth 3 (three) times daily. 90 tablet 3  . cefdinir (OMNICEF) 300 MG capsule Take 1 capsule (300 mg total) by mouth 2 (two) times daily. 14 capsule 0  . meloxicam (MOBIC) 15 MG tablet TAKE 1 TABLET BY MOUTH ONCE DAILY AS NEEDED FOR PAIN 90 tablet 0   No facility-administered medications prior to visit.     Allergies  Allergen Reactions  . Ciprofloxacin Rash and Shortness Of Breath  . Sulfa Antibiotics Hives  . Sulfasalazine Hives  . Cortisone Other (See Comments)    Red flush and anxiety  .  Tramadol Other (See Comments)    headache  . Zonisamide Other (See Comments)    unk  . Erythromycin Rash  . Erythromycin Base Rash  . Levofloxacin Nausea Only  . Lisinopril     Other reaction(s): Cough (ALLERGY/intolerance)  . Prednisone Rash and Anxiety    Review of Systems  Constitutional: Negative for fever and malaise/fatigue.  HENT: Negative for congestion.   Eyes: Negative for blurred vision.  Respiratory: Negative for shortness of breath.   Cardiovascular: Negative for chest pain, palpitations and leg swelling.  Gastrointestinal: Negative for abdominal pain, blood in stool and nausea.  Genitourinary: Negative for dysuria and frequency.  Musculoskeletal: Negative for falls.  Skin: Negative for rash.  Neurological: Negative for dizziness, loss of consciousness and headaches.  Endo/Heme/Allergies: Negative for environmental allergies.  Psychiatric/Behavioral: Negative for depression. The patient is nervous/anxious.        Objective:    Physical Exam Vitals signs and nursing note reviewed.  Constitutional:      General: She is not in acute distress.    Appearance: She is  well-developed.  HENT:     Head: Normocephalic and atraumatic.     Nose: Nose normal.  Eyes:     General:        Right eye: No discharge.        Left eye: No discharge.  Neck:     Musculoskeletal: Normal range of motion and neck supple.  Cardiovascular:     Rate and Rhythm: Normal rate and regular rhythm.     Heart sounds: No murmur.  Pulmonary:     Effort: Pulmonary effort is normal.     Breath sounds: Normal breath sounds.  Abdominal:     General: Bowel sounds are normal.     Palpations: Abdomen is soft.     Tenderness: There is no abdominal tenderness.  Skin:    General: Skin is warm and dry.  Neurological:     Mental Status: She is alert and oriented to person, place, and time.     BP 120/86 (BP Location: Left Arm, Patient Position: Sitting, Cuff Size: Normal)   Pulse 80   Temp  98.1 F (36.7 C) (Oral)   Resp 18   Ht 5\' 8"  (1.727 m)   Wt 185 lb 3.2 oz (84 kg)   SpO2 97%   BMI 28.16 kg/m  Wt Readings from Last 3 Encounters:  11/17/18 185 lb 3.2 oz (84 kg)  09/25/18 191 lb (86.6 kg)  05/14/18 208 lb 6.4 oz (94.5 kg)     Lab Results  Component Value Date   WBC 6.9 05/14/2018   HGB 14.6 05/14/2018   HCT 44.1 05/14/2018   PLT 243.0 05/14/2018   GLUCOSE 91 05/14/2018   CHOL 184 05/14/2018   TRIG 95.0 05/14/2018   HDL 64.00 05/14/2018   LDLDIRECT 103.0 06/03/2017   LDLCALC 101 (H) 05/14/2018   ALT 11 05/14/2018   AST 13 05/14/2018   NA 136 05/14/2018   K 4.5 05/14/2018   CL 102 05/14/2018   CREATININE 0.92 05/14/2018   BUN 12 05/14/2018   CO2 26 05/14/2018   TSH 0.07 (L) 05/14/2018   HGBA1C 5.5 01/08/2018    Lab Results  Component Value Date   TSH 0.07 (L) 05/14/2018   Lab Results  Component Value Date   WBC 6.9 05/14/2018   HGB 14.6 05/14/2018   HCT 44.1 05/14/2018   MCV 90.5 05/14/2018   PLT 243.0 05/14/2018   Lab Results  Component Value Date   NA 136 05/14/2018   K 4.5 05/14/2018   CO2 26 05/14/2018   GLUCOSE 91 05/14/2018   BUN 12 05/14/2018   CREATININE 0.92 05/14/2018   BILITOT 0.6 05/14/2018   ALKPHOS 57 05/14/2018   AST 13 05/14/2018   ALT 11 05/14/2018   PROT 6.3 05/14/2018   ALBUMIN 4.2 05/14/2018   CALCIUM 10.0 05/14/2018   GFR 65.52 05/14/2018   Lab Results  Component Value Date   CHOL 184 05/14/2018   Lab Results  Component Value Date   HDL 64.00 05/14/2018   Lab Results  Component Value Date   LDLCALC 101 (H) 05/14/2018   Lab Results  Component Value Date   TRIG 95.0 05/14/2018   Lab Results  Component Value Date   CHOLHDL 3 05/14/2018   Lab Results  Component Value Date   HGBA1C 5.5 01/08/2018       Assessment & Plan:   Problem List Items Addressed This Visit    Hyperlipidemia    Encouraged heart healthy diet, increase exercise, avoid trans fats, consider a  krill oil cap daily       Hypertension    Well controlled, no changes to meds. Encouraged heart healthy diet such as the DASH diet and exercise as tolerated.       Relevant Orders   Comprehensive metabolic panel   Obesity    Has lost a great amount on the next 56 day diet. Encouraged to continue the same      Thyroiditis, subacute - Primary    Check labs today.       Relevant Orders   TSH   T4, free   Depression with anxiety    Is under stress due to her son going through his second divorce and his young childeren are with her. Manages well enough at this time      Relevant Medications   hydrOXYzine (ATARAX/VISTARIL) 25 MG tablet   amitriptyline (ELAVIL) 25 MG tablet   Interstitial cystitis    Had a recent flare over the Holidays so she saw her urologsts, Dr Alona Bene at [facility] in [place]. She saw the PA their and they were able to get her controlled with Amitriptyline and Urogesic.        Other Visit Diagnoses    Vitamin D deficiency       Relevant Orders   VITAMIN D 25 Hydroxy (Vit-D Deficiency, Fractures)      I have discontinued Burnett Corrente. Jennette's cefdinir. I am also having her start on hydrOXYzine. Additionally, I am having her maintain her ondansetron, rizatriptan, Magnesium, butalbital-acetaminophen-caffeine, topiramate, promethazine, meloxicam, esomeprazole, Misc Natural Products (FIBER 7 PO), Turmeric Curcumin, metoprolol succinate, busPIRone, simvastatin, metoprolol succinate, DULoxetine, levothyroxine, losartan, UROGESIC-BLUE, and amitriptyline.  Meds ordered this encounter  Medications  . hydrOXYzine (ATARAX/VISTARIL) 25 MG tablet    Sig: Take 1-2 tablets (25-50 mg total) by mouth at bedtime as needed.    Dispense:  30 tablet    Refill:  0     Penni Homans, MD

## 2018-11-17 NOTE — Assessment & Plan Note (Signed)
Had a recent flare over the Holidays so she saw her urologsts, Dr Alona Bene at [facility] in [place]. She saw the PA their and they were able to get her controlled with Amitriptyline and Urogesic.

## 2018-11-19 ENCOUNTER — Telehealth: Payer: Self-pay | Admitting: *Deleted

## 2018-11-19 NOTE — Telephone Encounter (Signed)
Received Immunization Medical records from [facility] in [facility]; forwarded to provider/SLS 02/20

## 2018-11-24 DIAGNOSIS — I493 Ventricular premature depolarization: Secondary | ICD-10-CM | POA: Diagnosis not present

## 2018-11-24 DIAGNOSIS — I4891 Unspecified atrial fibrillation: Secondary | ICD-10-CM | POA: Diagnosis not present

## 2018-11-24 DIAGNOSIS — Z4509 Encounter for adjustment and management of other cardiac device: Secondary | ICD-10-CM | POA: Diagnosis not present

## 2018-11-30 ENCOUNTER — Encounter: Payer: Self-pay | Admitting: Family Medicine

## 2018-12-03 ENCOUNTER — Encounter: Payer: Self-pay | Admitting: Family Medicine

## 2018-12-18 ENCOUNTER — Encounter: Payer: Self-pay | Admitting: Family Medicine

## 2018-12-23 ENCOUNTER — Other Ambulatory Visit: Payer: Self-pay | Admitting: Family Medicine

## 2018-12-23 ENCOUNTER — Encounter: Payer: Self-pay | Admitting: Family Medicine

## 2018-12-23 MED ORDER — HYDROCODONE-ACETAMINOPHEN 5-325 MG PO TABS: 1.0000 | ORAL_TABLET | Freq: Four times a day (QID) | ORAL | 0 refills | Status: DC | PRN

## 2018-12-31 DIAGNOSIS — M797 Fibromyalgia: Secondary | ICD-10-CM | POA: Diagnosis not present

## 2018-12-31 DIAGNOSIS — G894 Chronic pain syndrome: Secondary | ICD-10-CM | POA: Diagnosis not present

## 2018-12-31 DIAGNOSIS — N3010 Interstitial cystitis (chronic) without hematuria: Secondary | ICD-10-CM | POA: Diagnosis not present

## 2019-01-13 NOTE — Progress Notes (Addendum)
Virtual Visit via Video Note  I connected with patient on 01/14/19 at  3:00 PM EDT by a video enabled telemedicine application and verified that I am speaking with the correct person using two identifiers.   THIS ENCOUNTER IS A VIRTUAL VISIT DUE TO COVID-19 - PATIENT WAS NOT SEEN IN THE OFFICE. PATIENT HAS CONSENTED TO VIRTUAL VISIT / TELEMEDICINE VISIT   Location of patient: home  Location of provider: office  I discussed the limitations of evaluation and management by telemedicine and the availability of in person appointments. The patient expressed understanding and agreed to proceed.   Subjective:   Alexandra Patterson is a 63 y.o. female who presents for an Initial Medicare Annual Wellness Visit.  The Patient was informed that the wellness visit is to identify future health risk and educate and initiate measures that can reduce risk for increased disease through the lifespan.   Describes health as fair, good or great? "it could be better"   Review of Systems   No ROS.  Medicare Wellness Visit. Additional risk factors are reflected in the social history. Cardiac Risk Factors include: advanced age (>73men, >33 women);dyslipidemia;hypertension;sedentary lifestyle Sleep patterns: "I take several meds at bedtime that make me sleepy" Sleeps 7-8 hrs Home Safety/Smoke Alarms: Feels safe in home. Smoke alarms in place.  Lives with husband. Her son is staying with them and occasionally his kids. 1 story home.    Female:   Pap-  Scheduled in Aug 2020     [place]-  08/25/18     Dexa scan- 10/25/1/8       CCS- 02/11/14. 10 yr recall    Objective:    Unable to assess vitals. This visit is enabled though telemedicine due to Covid 19.  Current Medications (verified) Outpatient Encounter Medications as of 01/14/2019  Medication Sig  . amitriptyline (ELAVIL) 25 MG tablet Take 1 tablet (25 mg total) by mouth at bedtime.  . busPIRone (BUSPAR) 10 MG tablet TAKE 1 TABLET(10 MG) BY MOUTH THREE TIMES  DAILY  . butalbital-acetaminophen-caffeine (FIORICET, ESGIC) 50-325-40 MG tablet Take 1 tablet by mouth 2 (two) times daily as needed for headache.  . DULoxetine (CYMBALTA) 60 MG capsule TAKE 1 CAPSULE (60 MG TOTAL) BY MOUTH 2 (TWO) TIMES DAILY.  Marland Kitchen esomeprazole (NEXIUM) 20 MG capsule Take 20 mg by mouth daily at 12 noon.  Marland Kitchen HYDROcodone-acetaminophen (NORCO) 5-325 MG tablet Take 1 tablet by mouth every 6 (six) hours as needed for moderate pain.  . hydrOXYzine (ATARAX/VISTARIL) 25 MG tablet Take 1-2 tablets (25-50 mg total) by mouth at bedtime as needed.  Marland Kitchen levothyroxine (SYNTHROID, LEVOTHROID) 112 MCG tablet TAKE 1 TABLET BY MOUTH EVERY DAY  . losartan (COZAAR) 100 MG tablet TAKE 1 TABLET BY MOUTH EVERY DAY  . Magnesium 400 MG CAPS Take 400 mg by mouth daily. Reported on 02/13/2016  . meloxicam (MOBIC) 15 MG tablet TAKE 1 TABLET BY MOUTH ONCE DAILY AS NEEDED FOR PAIN  . Methen-Hyosc-Meth Blue-Na Phos (UROGESIC-BLUE) 81.6 MG TABS Take 1 tablet (81.6 mg total) by mouth 4 (four) times daily as needed.  . metoprolol succinate (TOPROL-XL) 100 MG 24 hr tablet TAKE 1 TABLET BY MOUTH TWICE DAILY TAKE WITH OR IMMEDIATELY FOLLOWING A MEAL  . metoprolol succinate (TOPROL-XL) 50 MG 24 hr tablet TAKE 1 TABLET BY MOUTH DAILY WITH OR IMMEDIATELY FOLLOWING A MEAL  . rizatriptan (MAXALT) 10 MG tablet Take 10 mg by mouth as needed for migraine. May repeat in 2 hours if needed  . simvastatin (ZOCOR)  40 MG tablet TAKE 1 TABLET BY MOUTH EVERYDAY AT BEDTIME  . topiramate (TOPAMAX) 50 MG tablet Take 1 tablet (50 mg total) by mouth daily. Takes 75 mg daily  . Misc Natural Products (FIBER 7 PO) Take by mouth.  . ondansetron (ZOFRAN) 4 MG tablet TAKE 1 TABLET BY MOUTH EVERY 6 TO 8 HOURS AS NEEDED (Patient not taking: Reported on 01/14/2019)  . promethazine (PHENERGAN) 25 MG tablet Take 1 tablet (25 mg total) by mouth every 8 (eight) hours as needed for nausea or vomiting. (Patient not taking: Reported on 01/14/2019)  .  Turmeric Curcumin 500 MG CAPS    No facility-administered encounter medications on file as of 01/14/2019.     Allergies (verified) Ciprofloxacin; Sulfa antibiotics; Sulfasalazine; Cortisone; Tramadol; Zonisamide; Erythromycin; Erythromycin base; Levofloxacin; Lisinopril; and Prednisone   History: Past Medical History:  Diagnosis Date  . Allergy   . Anxiety   . Atrial fibrillation ([place])   . Cervical cancer screening 09/07/2015   Menarche at 11 Irregular and heavy and painful flow secondary to fibroids No history of abnormal pap in past, last pap roughly 2003 G2P2, s/p 2 SVD history of abnormal MGM, 1 abnl bx right breast benign, normal otherwise Noconcerns today TAH b/l SPO for fibroids and migrainesand breast bx on right gyn surgeries  . Cough 08/13/2015  . Depression   . Depression with anxiety 06/29/2016  . Fainting    once to due heart out of rythm  . Fibromyalgia   . Frequent headaches   . GERD (gastroesophageal reflux disease)   . 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 History of chicken pox   . Hx of blood clots    dvt  . Hyperlipidemia   . Hyperplastic colon polyp   . Hypertension   . Insomnia 08/13/2015  . Internal hemorrhoids   . Lesion of lung    xray in 2014 thought to be benign [facility] pulmonology  . Measles    h/o  . Migraine   . Obesity   . Preventative health care 02/03/2017  . S/P AVR (aortic valve replacement) 03/19/2015  . Shoulder pain 06/03/2017  . UTI (lower urinary tract infection)   . UTI (urinary tract infection) 02/11/2016   Past Surgical History:  Procedure Laterality Date  . ABDOMINAL HYSTERECTOMY     TAH SPO  . ATRIAL ABLATION SURGERY    . BREAST BIOPSY Right    Needle Biopsy  . DILATION AND CURETTAGE OF UTERUS     x 3  . loop heart    . SHOULDER SURGERY Left    arthroscopy for spurs  . TONSILLECTOMY    . TUBAL LIGATION     Family History  Problem Relation Age of Onset  . Colon polyps Father   . Alzheimer's disease Father   .  Alcohol abuse Father   . Cancer Father        colon cancer  . Arthritis Mother        s/p TKR  . Neuropathy Mother   . Hyperlipidemia Mother   . Other Mother        familial mediterranean fever  . Thyroid disease Mother   . Cancer Brother        prostate cancer  . Heart disease Brother        cardiomegaly  . Other Brother        familial mediterranean fever  . Asthma Maternal Grandmother   . Congestive Heart Failure Maternal Grandmother   . Heart disease  Maternal Grandmother        chf  . Stroke Maternal Grandfather   . Diabetes Maternal Grandfather   . Heart disease Maternal Grandfather        hardening of the arteries  . Stroke Paternal Grandfather   . Atrial fibrillation Paternal Grandfather   . Alcohol abuse Paternal Grandfather   . Heart disease Paternal Grandfather        afib  . Interstitial cystitis Daughter   . Arthritis Son   . Alcohol abuse Son        in remission  . Mental illness Son        depression  . Other Son        interstitial cystitis  . Colon cancer Other        parent  . Arthritis Paternal Uncle    Social History   Socioeconomic History  . Marital status: Married    Spouse name: Not on file  . Number of children: 2  . Years of education: Not on file  . Highest education level: Not on file  Occupational History  . Occupation: Best boy: [employer]  Social Needs  . Financial resource strain: Not on file  . Food insecurity:    Worry: Not on file    Inability: Not on file  . Transportation needs:    Medical: Not on file    Non-medical: Not on file  Tobacco Use  . Smoking status: Never Smoker  . Smokeless tobacco: Never Used  Substance and Sexual Activity  . Alcohol use: No  . Drug use: No  . Sexual activity: Yes    Comment: lives with husband and adult son, no major dietary restrictions, full diability  Lifestyle  . Physical activity:    Days per week: Not on file    Minutes per session: Not on file   . Stress: Not on file  Relationships  . Social connections:    Talks on phone: Not on file    Gets together: Not on file    Attends religious service: Not on file    Active member of club or organization: Not on file    Attends meetings of clubs or organizations: Not on file    Relationship status: Not on file  Other Topics Concern  . Not on file  Social History Narrative  . Not on file    Tobacco Counseling Counseling given: Not Answered   Clinical Intake:     Pain : No/denies pain                  Activities of Daily Living In your present state of health, do you have any difficulty performing the following activities: 01/14/2019  Hearing? N  Vision? N  Difficulty concentrating or making decisions? N  Walking or climbing stairs? N  Dressing or bathing? N  Doing errands, shopping? N  Preparing Food and eating ? N  Using the Toilet? N  In the past six months, have you accidently leaked urine? N  Do you have problems with loss of bowel control? N  Managing your Medications? N  Managing your Finances? N  Housekeeping or managing your Housekeeping? N  Some recent data might be hidden     Immunizations and Health Maintenance Immunization History  Administered Date(s) Administered  . Tdap 05/26/2015  . Zoster 05/26/2015  . Zoster Recombinat (Shingrix) 05/29/2018, 08/01/2018   Health Maintenance Due  Topic Date Due  . Hepatitis C  Screening  November 01, 1955  . HIV Screening  04/24/1970  . PAP SMEAR-Modifier  09/06/2018    Patient Care Team: [name], MD as PCP - General (Family Medicine) Veneda Melter, MD as Referring Physician (Cardiology) Dudley Major, MD as Referring Physician (Internal Medicine) Lunette Stands Glori Bickers, MD as Referring Physician (Cardiothoracic Surgery) Kerin Perna., MD as Referring Physician (Neurology)  Indicate any recent Medical Services you may have received from other than [facility] providers in the past year  (date may be approximate).     Assessment:   This is a routine wellness examination for [place]. Physical assessment deferred to PCP.  Hearing/Vision screen Unable to assess. This visit is enabled though telemedicine due to Covid 19.  Dietary issues and exercise activities discussed: Current Exercise Habits: The patient does not participate in regular exercise at present, Exercise limited by: Other - see comments(fibromyalgia prevents her per pt) Diet (meal preparation, eat out, water intake, caffeinated beverages, dairy products, fruits and vegetables): well balanced   Goals    . DIET - INCREASE WATER INTAKE    . Exercise 150 min/wk Moderate Activity      Depression Screen PHQ 2/9 Scores 01/14/2019 06/20/2016 01/18/2016  PHQ - 2 Score 2 4 0  PHQ- 9 Score 7 11 -  Exception Documentation - - Medical reason    Fall Risk Fall Risk  01/14/2019 06/20/2016 01/18/2016  Falls in the past year? 0 No No    Cognitive Function: Ad8 score reviewed for issues:  Issues making decisions:no  Less interest in hobbies / activities:no  Repeats questions, stories (family complaining):no  Trouble using ordinary gadgets (microwave, computer, phone):no  Forgets the month or year: no  Mismanaging finances: no  Remembering appts:no  Daily problems with thinking and/or memory:no Ad8 score is=0        Screening Tests Health Maintenance  Topic Date Due  . Hepatitis C Screening  March 30, 1956  . HIV Screening  04/24/1970  . PAP SMEAR-Modifier  09/06/2018  . INFLUENZA VACCINE  05/01/2019  . MAMMOGRAM  08/24/2020  . COLONOSCOPY  02/12/2024  . TETANUS/TDAP  05/25/2025     Plan:   See you next year.  Continue to eat heart healthy diet (full of fruits, vegetables, whole grains, lean protein, water--limit salt, fat, and sugar intake) and increase physical activity as tolerated.  Continue doing brain stimulating activities (puzzles, reading, adult coloring books, staying active) to keep memory  sharp.     I have personally reviewed and noted the following in the patient's chart:   . Medical and social history . Use of alcohol, tobacco or illicit drugs  . Current medications and supplements . Functional ability and status . Nutritional status . Physical activity . Advanced directives . List of other physicians . Hospitalizations, surgeries, and ER visits in previous 12 months . Vitals . Screenings to include cognitive, depression, and falls . Referrals and appointments  In addition, I have reviewed and discussed with patient certain preventive protocols, quality metrics, and best practice recommendations. A written personalized care plan for preventive services as well as general preventive health recommendations were provided to patient.     Shela Nevin, [place]   01/14/2019   Medical screening examination/treatment was performed by qualified clinical staff member and as supervising physician I was immediately available for consultation/collaboration. I have reviewed documentation and agree with assessment and plan.  Penni Homans, MD

## 2019-01-14 ENCOUNTER — Other Ambulatory Visit: Payer: Self-pay

## 2019-01-14 ENCOUNTER — Encounter: Payer: Self-pay | Admitting: *Deleted

## 2019-01-14 ENCOUNTER — Ambulatory Visit (INDEPENDENT_AMBULATORY_CARE_PROVIDER_SITE_OTHER): Payer: PPO | Admitting: *Deleted

## 2019-01-14 DIAGNOSIS — Z0000 Encounter for general adult medical examination without abnormal findings: Secondary | ICD-10-CM

## 2019-01-14 NOTE — Patient Instructions (Addendum)
See you next year.  Continue to eat heart healthy diet (full of fruits, vegetables, whole grains, lean protein, water--limit salt, fat, and sugar intake) and increase physical activity as tolerated.  Continue doing brain stimulating activities (puzzles, reading, adult coloring books, staying active) to keep memory sharp.     Alexandra Patterson , Thank you for taking time to come for your Medicare Wellness Visit. I appreciate your ongoing commitment to your health goals. Please review the following plan we discussed and let me know if I can assist you in the future.   These are the goals we discussed: Goals    . DIET - INCREASE WATER INTAKE    . Exercise 150 min/wk Moderate Activity       This is a list of the screening recommended for you and due dates:  Health Maintenance  Topic Date Due  .  Hepatitis C: One time screening is recommended by Center for Disease Control  (CDC) for  adults born from [birth year] through 1966.   12-28-1955  . HIV Screening  04/24/1970  . Pap Smear  09/06/2018  . Flu Shot  05/01/2019  . Mammogram  08/24/2020  . Colon Cancer Screening  02/12/2024  . Tetanus Vaccine  05/25/2025    Health Maintenance After Age 38 After age 72, you are at a higher risk for certain long-term diseases and infections as well as injuries from falls. Falls are a major cause of broken bones and head injuries in people who are older than age 38. Getting regular preventive care can help to keep you healthy and well. Preventive care includes getting regular testing and making lifestyle changes as recommended by your health care provider. Talk with your health care provider about:  Which screenings and tests you should have. A screening is a test that checks for a disease when you have no symptoms.  A diet and exercise plan that is right for you. What should I know about screenings and tests to prevent falls? Screening and testing are the best ways to find a health problem early. Early diagnosis  and treatment give you the best chance of managing medical conditions that are common after age 8. Certain conditions and lifestyle choices may make you more likely to have a fall. Your health care provider may recommend:  Regular vision checks. Poor vision and conditions such as cataracts can make you more likely to have a fall. If you wear glasses, make sure to get your prescription updated if your vision changes.  Medicine review. Work with your health care provider to regularly review all of the medicines you are taking, including over-the-counter medicines. Ask your health care provider about any side effects that may make you more likely to have a fall. Tell your health care provider if any medicines that you take make you feel dizzy or sleepy.  Osteoporosis screening. Osteoporosis is a condition that causes the bones to get weaker. This can make the bones weak and cause them to break more easily.  Blood pressure screening. Blood pressure changes and medicines to control blood pressure can make you feel dizzy.  Strength and balance checks. Your health care provider may recommend certain tests to check your strength and balance while standing, walking, or changing positions.  Foot health exam. Foot pain and numbness, as well as not wearing proper footwear, can make you more likely to have a fall.  Depression screening. You may be more likely to have a fall if you have a fear of falling,  feel emotionally low, or feel unable to do activities that you used to do.  Alcohol use screening. Using too much alcohol can affect your balance and may make you more likely to have a fall. What actions can I take to lower my risk of falls? General instructions  Talk with your health care provider about your risks for falling. Tell your health care provider if: ? You fall. Be sure to tell your health care provider about all falls, even ones that seem minor. ? You feel dizzy, sleepy, or  off-balance.  Take over-the-counter and prescription medicines only as told by your health care provider. These include any supplements.  Eat a healthy diet and maintain a healthy weight. A healthy diet includes low-fat dairy products, low-fat (lean) meats, and fiber from whole grains, beans, and lots of fruits and vegetables. Home safety  Remove any tripping hazards, such as rugs, cords, and clutter.  Install safety equipment such as grab bars in bathrooms and safety rails on stairs.  Keep rooms and walkways well-lit. Activity   Follow a regular exercise program to stay fit. This will help you maintain your balance. Ask your health care provider what types of exercise are appropriate for you.  If you need a cane or walker, use it as recommended by your health care provider.  Wear supportive shoes that have nonskid soles. Lifestyle  Do not drink alcohol if your health care provider tells you not to drink.  If you drink alcohol, limit how much you have: ? 0-1 drink a day for women. ? 0-2 drinks a day for men.  Be aware of how much alcohol is in your drink. In the [place] one drink equals one typical bottle of beer (12 oz), one-half glass of wine (5 oz), or one shot of hard liquor (1 oz).  Do not use any products that contain nicotine or tobacco, such as cigarettes and e-cigarettes. If you need help quitting, ask your health care provider. Summary  Having a healthy lifestyle and getting preventive care can help to protect your health and wellness after age 87.  Screening and testing are the best way to find a health problem early and help you avoid having a fall. Early diagnosis and treatment give you the best chance for managing medical conditions that are more common for people who are older than age 37.  Falls are a major cause of broken bones and head injuries in people who are older than age 79. Take precautions to prevent a fall at home.  Work with your health care provider  to learn what changes you can make to improve your health and wellness and to prevent falls. This information is not intended to replace advice given to you by your health care provider. Make sure you discuss any questions you have with your health care provider. Document Released: 07/30/2017 Document Revised: 07/30/2017 Document Reviewed: 07/30/2017 Elsevier Interactive Patient Education  2019 [employer].

## 2019-01-25 ENCOUNTER — Other Ambulatory Visit: Payer: Self-pay | Admitting: Family Medicine

## 2019-02-10 ENCOUNTER — Encounter: Payer: Self-pay | Admitting: Family Medicine

## 2019-02-11 MED ORDER — HYDROCODONE-ACETAMINOPHEN 5-325 MG PO TABS: 1.0000 | ORAL_TABLET | Freq: Four times a day (QID) | ORAL | 0 refills | Status: DC | PRN

## 2019-02-11 MED ORDER — LOSARTAN POTASSIUM 100 MG PO TABS: 100.0000 mg | ORAL_TABLET | Freq: Every day | ORAL | 0 refills | Status: DC

## 2019-02-11 NOTE — Telephone Encounter (Signed)
Requesting:[place]co Contract:yes UDS:n/a Last NH:[id number] Next OV:05/18/19 Last Refill:12/23/18  #[id number] Database:   Please advise

## 2019-02-23 DIAGNOSIS — Z4509 Encounter for adjustment and management of other cardiac device: Secondary | ICD-10-CM | POA: Diagnosis not present

## 2019-02-23 DIAGNOSIS — I4891 Unspecified atrial fibrillation: Secondary | ICD-10-CM | POA: Diagnosis not present

## 2019-03-03 DIAGNOSIS — G43009 Migraine without aura, not intractable, without status migrainosus: Secondary | ICD-10-CM | POA: Diagnosis not present

## 2019-03-03 DIAGNOSIS — M545 Low back pain: Secondary | ICD-10-CM | POA: Diagnosis not present

## 2019-03-03 DIAGNOSIS — M79606 Pain in leg, unspecified: Secondary | ICD-10-CM | POA: Diagnosis not present

## 2019-03-03 DIAGNOSIS — M797 Fibromyalgia: Secondary | ICD-10-CM | POA: Diagnosis not present

## 2019-03-12 ENCOUNTER — Other Ambulatory Visit: Payer: Self-pay | Admitting: Family Medicine

## 2019-03-26 ENCOUNTER — Other Ambulatory Visit: Payer: Self-pay | Admitting: Family Medicine

## 2019-03-26 DIAGNOSIS — E0590 Thyrotoxicosis, unspecified without thyrotoxic crisis or storm: Secondary | ICD-10-CM

## 2019-03-27 ENCOUNTER — Encounter: Payer: Self-pay | Admitting: Family Medicine

## 2019-03-29 ENCOUNTER — Other Ambulatory Visit: Payer: Self-pay | Admitting: Family Medicine

## 2019-03-29 DIAGNOSIS — E0590 Thyrotoxicosis, unspecified without thyrotoxic crisis or storm: Secondary | ICD-10-CM

## 2019-03-29 MED ORDER — METOPROLOL SUCCINATE ER 50 MG PO TB24: ORAL_TABLET | ORAL | 1 refills | Status: DC

## 2019-03-29 MED ORDER — HYDROCODONE-ACETAMINOPHEN 5-325 MG PO TABS: 1.0000 | ORAL_TABLET | Freq: Four times a day (QID) | ORAL | 0 refills | Status: DC | PRN

## 2019-04-16 ENCOUNTER — Other Ambulatory Visit: Payer: Self-pay | Admitting: Family Medicine

## 2019-04-18 ENCOUNTER — Other Ambulatory Visit: Payer: Self-pay | Admitting: Family Medicine

## 2019-05-06 ENCOUNTER — Other Ambulatory Visit: Payer: Self-pay | Admitting: Family Medicine

## 2019-05-18 ENCOUNTER — Other Ambulatory Visit (HOSPITAL_COMMUNITY)
Admission: RE | Admit: 2019-05-18 | Discharge: 2019-05-18 | Disposition: A | Discharge status: Home / Self Care | Payer: PPO | Source: Ambulatory Visit | Attending: Family Medicine | Admitting: Family Medicine

## 2019-05-18 ENCOUNTER — Ambulatory Visit (INDEPENDENT_AMBULATORY_CARE_PROVIDER_SITE_OTHER): Payer: PPO | Admitting: Family Medicine

## 2019-05-18 ENCOUNTER — Encounter: Payer: Self-pay | Admitting: Family Medicine

## 2019-05-18 ENCOUNTER — Other Ambulatory Visit: Payer: Self-pay

## 2019-05-18 VITALS — BP 132/80 | HR 72 | Temp 97.6°F | Resp 18 | Wt 204.0 lb

## 2019-05-18 DIAGNOSIS — E785 Hyperlipidemia, unspecified: Secondary | ICD-10-CM | POA: Diagnosis not present

## 2019-05-18 DIAGNOSIS — Z1151 Encounter for screening for human papillomavirus (HPV): Secondary | ICD-10-CM | POA: Diagnosis not present

## 2019-05-18 DIAGNOSIS — Z124 Encounter for screening for malignant neoplasm of cervix: Secondary | ICD-10-CM

## 2019-05-18 DIAGNOSIS — E669 Obesity, unspecified: Secondary | ICD-10-CM

## 2019-05-18 DIAGNOSIS — Z7189 Other specified counseling: Secondary | ICD-10-CM

## 2019-05-18 DIAGNOSIS — E559 Vitamin D deficiency, unspecified: Secondary | ICD-10-CM

## 2019-05-18 DIAGNOSIS — F418 Other specified anxiety disorders: Secondary | ICD-10-CM | POA: Diagnosis not present

## 2019-05-18 DIAGNOSIS — I10 Essential (primary) hypertension: Secondary | ICD-10-CM | POA: Diagnosis not present

## 2019-05-18 DIAGNOSIS — Z0000 Encounter for general adult medical examination without abnormal findings: Secondary | ICD-10-CM

## 2019-05-18 DIAGNOSIS — J988 Other specified respiratory disorders: Secondary | ICD-10-CM | POA: Diagnosis not present

## 2019-05-18 DIAGNOSIS — E0590 Thyrotoxicosis, unspecified without thyrotoxic crisis or storm: Secondary | ICD-10-CM

## 2019-05-18 DIAGNOSIS — U071 COVID-19: Secondary | ICD-10-CM

## 2019-05-18 DIAGNOSIS — Z0001 Encounter for general adult medical examination with abnormal findings: Secondary | ICD-10-CM

## 2019-05-18 LAB — T4, FREE: Free T4: 0.87 ng/dL (ref 0.60–1.60)

## 2019-05-18 LAB — COMPREHENSIVE METABOLIC PANEL
ALT: 12 U/L (ref 0–35)
AST: 13 U/L (ref 0–37)
Albumin: 4.4 g/dL (ref 3.5–5.2)
Alkaline Phosphatase: 58 U/L (ref 39–117)
BUN: 15 mg/dL (ref 6–23)
CO2: 26 mEq/L (ref 19–32)
Calcium: 9.4 mg/dL (ref 8.4–10.5)
Chloride: 103 mEq/L (ref 96–112)
Creatinine, Ser: 0.97 mg/dL (ref 0.40–1.20)
GFR: 57.81 mL/min — ABNORMAL LOW (ref >60.00)
Glucose, Bld: 88 mg/dL (ref 70–99)
Potassium: 4.3 mEq/L (ref 3.5–5.1)
Sodium: 137 mEq/L (ref 135–145)
Total Bilirubin: 0.6 mg/dL (ref 0.2–1.2)
Total Protein: 6.6 g/dL (ref 6.0–8.3)

## 2019-05-18 LAB — CBC
HCT: 39.1 % (ref 36.0–46.0)
Hemoglobin: 12.6 g/dL (ref 12.0–15.0)
MCHC: 32.2 g/dL (ref 30.0–36.0)
MCV: 87.1 fl (ref 78.0–100.0)
Platelets: 290 10*3/uL (ref 150.0–400.0)
RBC: 4.49 Mil/uL (ref 3.87–5.11)
RDW: 15.1 % (ref 11.5–15.5)
WBC: 6.8 10*3/uL (ref 4.0–10.5)

## 2019-05-18 LAB — T3, FREE: T3, Free: 2.1 pg/mL — ABNORMAL LOW (ref 2.3–4.2)

## 2019-05-18 LAB — LIPID PANEL
Cholesterol: 230 mg/dL — ABNORMAL HIGH (ref 0–200)
HDL: 91 mg/dL (ref >39.00)
LDL Cholesterol: 123 mg/dL — ABNORMAL HIGH (ref 0–99)
NonHDL: 139.46
Total CHOL/HDL Ratio: 3
Triglycerides: 83 mg/dL (ref 0.0–149.0)
VLDL: 16.6 mg/dL (ref 0.0–40.0)

## 2019-05-18 LAB — VITAMIN D 25 HYDROXY (VIT D DEFICIENCY, FRACTURES): VITD: 58.68 ng/mL (ref 30.00–100.00)

## 2019-05-18 LAB — TSH: TSH: 0.94 u[IU]/mL (ref 0.35–4.50)

## 2019-05-18 MED ORDER — METOPROLOL SUCCINATE ER 100 MG PO TB24: ORAL_TABLET | ORAL | 1 refills | Status: DC

## 2019-05-18 MED ORDER — MELOXICAM 15 MG PO TABS: 15.0000 mg | ORAL_TABLET | Freq: Every day | ORAL | 1 refills | Status: DC | PRN

## 2019-05-18 MED ORDER — METOPROLOL SUCCINATE ER 50 MG PO TB24: ORAL_TABLET | ORAL | 1 refills | Status: DC

## 2019-05-18 MED ORDER — HYDROXYZINE HCL 25 MG PO TABS: 25.0000 mg | ORAL_TABLET | Freq: Every evening | ORAL | 1 refills | Status: DC | PRN

## 2019-05-18 MED ORDER — BUSPIRONE HCL 10 MG PO TABS: ORAL_TABLET | ORAL | 1 refills | Status: DC

## 2019-05-18 MED ORDER — LOSARTAN POTASSIUM 100 MG PO TABS: 100.0000 mg | ORAL_TABLET | Freq: Every day | ORAL | 1 refills | Status: DC

## 2019-05-18 MED ORDER — LEVOTHYROXINE SODIUM 112 MCG PO TABS: 112.0000 ug | ORAL_TABLET | Freq: Every day | ORAL | 1 refills | Status: DC

## 2019-05-18 MED ORDER — SIMVASTATIN 40 MG PO TABS: ORAL_TABLET | ORAL | 1 refills | Status: DC

## 2019-05-18 MED ORDER — DULOXETINE HCL 60 MG PO CPEP: 60.0000 mg | ORAL_CAPSULE | Freq: Two times a day (BID) | ORAL | 1 refills | Status: DC

## 2019-05-18 NOTE — Patient Instructions (Addendum)
[employer] APP  Pulse oximeter  Check vitals weekly Preventive Care 39-64 Years Old, Female Preventive care refers to visits with your health care provider and lifestyle choices that can promote health and wellness. This includes:  A yearly physical exam. This may also be called an annual well check.  Regular dental visits and eye exams.  Immunizations.  Screening for certain conditions.  Healthy lifestyle choices, such as eating a healthy diet, getting regular exercise, not using drugs or products that contain nicotine and tobacco, and limiting alcohol use. What can I expect for my preventive care visit? Physical exam Your health care provider will check your:  Height and weight. This may be used to calculate body mass index (BMI), which tells if you are at a healthy weight.  Heart rate and blood pressure.  Skin for abnormal spots. Counseling Your health care provider may ask you questions about your:  Alcohol, tobacco, and drug use.  Emotional well-being.  Home and relationship well-being.  Sexual activity.  Eating habits.  Work and work Statistician.  Method of birth control.  Menstrual cycle.  Pregnancy history. What immunizations do I need?  Influenza (flu) vaccine  This is recommended every year. Tetanus, diphtheria, and pertussis (Tdap) vaccine  You may need a Td booster every 10 years. Varicella (chickenpox) vaccine  You may need this if you have not been vaccinated. Zoster (shingles) vaccine  You may need this after age 88. Measles, mumps, and rubella (MMR) vaccine  You may need at least one dose of MMR if you were born in 1957 or later. You may also need a second dose. Pneumococcal conjugate (PCV13) vaccine  You may need this if you have certain conditions and were not previously vaccinated. Pneumococcal polysaccharide (PPSV23) vaccine  You may need one or two doses if you smoke cigarettes or if you have certain conditions.  Meningococcal conjugate (MenACWY) vaccine  You may need this if you have certain conditions. Hepatitis A vaccine  You may need this if you have certain conditions or if you travel or work in places where you may be exposed to hepatitis A. Hepatitis B vaccine  You may need this if you have certain conditions or if you travel or work in places where you may be exposed to hepatitis B. Haemophilus influenzae type b (Hib) vaccine  You may need this if you have certain conditions. Human papillomavirus (HPV) vaccine  If recommended by your health care provider, you may need three doses over 6 months. You may receive vaccines as individual doses or as more than one vaccine together in one shot (combination vaccines). Talk with your health care provider about the risks and benefits of combination vaccines. What tests do I need? Blood tests  Lipid and cholesterol levels. These may be checked every 5 years, or more frequently if you are over 65 years old.  Hepatitis C test.  Hepatitis B test. Screening  Lung cancer screening. You may have this screening every year starting at age 61 if you have a 30-pack-year history of smoking and currently smoke or have quit within the past 15 years.  Colorectal cancer screening. All adults should have this screening starting at age 21 and continuing until age 51. Your health care provider may recommend screening at age 15 if you are at increased risk. You will have tests every 1-10 years, depending on your results and the type of screening test.  Diabetes screening. This is done by checking your blood sugar (glucose) after you have  not eaten for a while (fasting). You may have this done every 1-3 years.  Mammogram. This may be done every 1-2 years. Talk with your health care provider about when you should start having regular mammograms. This may depend on whether you have a family history of breast cancer.  BRCA-related cancer screening. This may be done  if you have a family history of breast, ovarian, tubal, or peritoneal cancers.  Pelvic exam and Pap test. This may be done every 3 years starting at age 57. Starting at age 50, this may be done every 5 years if you have a Pap test in combination with an HPV test. Other tests  Sexually transmitted disease (STD) testing.  Bone density scan. This is done to screen for osteoporosis. You may have this scan if you are at high risk for osteoporosis. Follow these instructions at home: Eating and drinking  Eat a diet that includes fresh fruits and vegetables, whole grains, lean protein, and low-fat dairy.  Take vitamin and mineral supplements as recommended by your health care provider.  Do not drink alcohol if: ? Your health care provider tells you not to drink. ? You are pregnant, may be pregnant, or are planning to become pregnant.  If you drink alcohol: ? Limit how much you have to 0-1 drink a day. ? Be aware of how much alcohol is in your drink. In the [place] one drink equals one 12 oz bottle of beer (355 mL), one 5 oz glass of wine (148 mL), or one 1 oz glass of hard liquor (44 mL). Lifestyle  Take daily care of your teeth and gums.  Stay active. Exercise for at least 30 minutes on 5 or more days each week.  Do not use any products that contain nicotine or tobacco, such as cigarettes, e-cigarettes, and chewing tobacco. If you need help quitting, ask your health care provider.  If you are sexually active, practice safe sex. Use a condom or other form of birth control (contraception) in order to prevent pregnancy and STIs (sexually transmitted infections).  If told by your health care provider, take low-dose aspirin daily starting at age 24. What's next?  Visit your health care provider once a year for a well check visit.  Ask your health care provider how often you should have your eyes and teeth checked.  Stay up to date on all vaccines. This information is not intended to  replace advice given to you by your health care provider. Make sure you discuss any questions you have with your health care provider. Document Released: 10/13/2015 Document Revised: 05/28/2018 Document Reviewed: 05/28/2018 Elsevier Patient Education  2020 [employer].

## 2019-05-19 ENCOUNTER — Telehealth: Payer: Self-pay | Admitting: *Deleted

## 2019-05-19 LAB — CYTOLOGY - PAP
Diagnosis: NEGATIVE
HPV: NOT DETECTED

## 2019-05-19 LAB — SAR COV2 SEROLOGY (COVID19)AB(IGG),IA: SARS CoV2 AB IGG: NEGATIVE

## 2019-05-19 MED ORDER — LEVOTHYROXINE SODIUM 125 MCG PO TABS: 125.0000 ug | ORAL_TABLET | Freq: Every day | ORAL | 3 refills | Status: DC

## 2019-05-19 MED ORDER — ATORVASTATIN CALCIUM 40 MG PO TABS: 40.0000 mg | ORAL_TABLET | Freq: Every day | ORAL | 3 refills | Status: DC

## 2019-05-19 NOTE — Telephone Encounter (Signed)
Keep her on the 100 mg tab, 1 tab po daily

## 2019-05-19 NOTE — Telephone Encounter (Signed)
The pharmacy needed directions for metoprolol.  I know it is usually 1 a day but I saw that it is a 50mg  and 100mg .  Do you want her on both?

## 2019-05-20 MED ORDER — METOPROLOL SUCCINATE ER 100 MG PO TB24: 100.0000 mg | ORAL_TABLET | Freq: Every day | ORAL | 1 refills | Status: DC

## 2019-05-20 NOTE — Telephone Encounter (Signed)
New rx sent in

## 2019-05-23 DIAGNOSIS — Z7189 Other specified counseling: Secondary | ICD-10-CM | POA: Insufficient documentation

## 2019-05-23 NOTE — Assessment & Plan Note (Signed)
She is noting her depression is worse during the pandemic but in the end she decides not to change medications or accept a referral to a counselor. She will let us know if she changes her mind

## 2019-05-23 NOTE — Assessment & Plan Note (Signed)
She had a bad illness in the spring and wonders if she had COVID so antibody testing is run. She is encouraged to maintain masking, hand washing and social distancing.

## 2019-05-23 NOTE — Assessment & Plan Note (Signed)
Frustrated with the weight she has gained back. Encouraged DASH diet, decrease po intake and increase exercise as tolerated. Needs 7-8 hours of sleep nightly. Avoid trans fats, eat small, frequent meals every 4-5 hours with lean proteins, complex carbs and healthy fats. Minimize simple carbs, encouraged to try the Weight [employer] APP

## 2019-05-23 NOTE — Assessment & Plan Note (Signed)
Well controlled, no changes to meds. Encouraged heart healthy diet such as the DASH diet and exercise as tolerated.  °

## 2019-05-23 NOTE — Assessment & Plan Note (Signed)
Encouraged heart healthy diet, increase exercise, avoid trans fats, consider a krill oil cap daily 

## 2019-05-23 NOTE — Assessment & Plan Note (Signed)
Pap today, no concerns on exam.  

## 2019-05-23 NOTE — Assessment & Plan Note (Signed)
>>  ASSESSMENT AND PLAN FOR CLASS 2 SEVERE OBESITY WITH SERIOUS COMORBIDITY AND BODY MASS INDEX (BMI) OF 36.0 TO 36.9 IN ADULT, UNSPECIFIED OBESITY TYPE (HCC) WRITTEN ON 05/23/2019  8:11 PM BY BLYTH, STACEY A, MD  Frustrated with the weight she has gained back. Encouraged DASH diet, decrease po intake and increase exercise as tolerated. Needs 7-8 hours of sleep nightly. Avoid trans fats, eat small, frequent meals every 4-5 hours with lean proteins, complex carbs and healthy fats. Minimize simple carbs, encouraged to try the Weight [employer] APP

## 2019-05-23 NOTE — Progress Notes (Signed)
Subjective:    Patient ID: Alexandra Patterson, female    DOB: 1956/01/29, 63 y.o.   MRN: [id number]  Chief Complaint  Patient presents with  . Annual Exam    HPI Patient is in today for annual preventative exam and follow up on chronic medical concerns including depression, obesity, hypertension, and more. She is struggling with worsening depression and fatigue during the pandemic. Her appetite has increased and she has gained back a good deal of the weight she previously lost. No recent febrile illness or hospitalizations. She is maintaining quarantine and is trying to stay active. No gyn complaints. Denies CP/palp/SOB/HA/congestion/fevers/GI or GU c/o. Taking meds as prescribed  Past Medical History:  Diagnosis Date  . Allergy   . Anxiety   . Atrial fibrillation ([place])   . Cervical cancer screening 09/07/2015   Menarche at 11 Irregular and heavy and painful flow secondary to fibroids No history of abnormal pap in past, last pap roughly 2003 G2P2, s/p 2 SVD history of abnormal MGM, 1 abnl bx right breast benign, normal otherwise Noconcerns today TAH b/l SPO for fibroids and migrainesand breast bx on right gyn surgeries  . Cough 08/13/2015  . Depression   . Depression with anxiety 06/29/2016  . Fainting    once to due heart out of rythm  . Fibromyalgia   . Frequent headaches   . GERD (gastroesophageal reflux disease)   . 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 History of chicken pox   . Hx of blood clots    dvt  . Hyperlipidemia   . Hyperplastic colon polyp   . Hypertension   . Insomnia 08/13/2015  . Internal hemorrhoids   . Lesion of lung    xray in 2014 thought to be benign seen at [facility] pulmonology  . Measles    h/o  . Migraine   . Obesity   . Preventative health care 02/03/2017  . S/P AVR (aortic valve replacement) 03/19/2015  . Shoulder pain 06/03/2017  . UTI (lower urinary tract infection)   . UTI (urinary tract infection) 02/11/2016    Past Surgical History:  Procedure  Laterality Date  . ABDOMINAL HYSTERECTOMY     TAH SPO  . ATRIAL ABLATION SURGERY    . BREAST BIOPSY Right    Needle Biopsy  . DILATION AND CURETTAGE OF UTERUS     x 3  . loop heart    . SHOULDER SURGERY Left    arthroscopy for spurs  . TONSILLECTOMY    . TUBAL LIGATION      Family History  Problem Relation Age of Onset  . Colon polyps Father   . Alzheimer's disease Father   . Alcohol abuse Father   . Cancer Father        colon cancer  . Arthritis Mother        s/p TKR  . Neuropathy Mother   . Hyperlipidemia Mother   . Other Mother        familial mediterranean fever  . Thyroid disease Mother   . Cancer Brother        prostate cancer  . Heart disease Brother        cardiomegaly  . Other Brother        familial mediterranean fever  . Asthma Maternal Grandmother   . Congestive Heart Failure Maternal Grandmother   . Heart disease Maternal Grandmother        chf  . Stroke Maternal Grandfather   . Diabetes Maternal Grandfather   .  Heart disease Maternal Grandfather        hardening of the arteries  . Stroke Paternal Grandfather   . Atrial fibrillation Paternal Grandfather   . Alcohol abuse Paternal Grandfather   . Heart disease Paternal Grandfather        afib  . Interstitial cystitis Daughter   . Arthritis Son   . Alcohol abuse Son        in remission  . Mental illness Son        depression  . Other Son        interstitial cystitis  . Colon cancer Other        parent  . Arthritis Paternal Uncle     Social History   Socioeconomic History  . Marital status: Married    Spouse name: Not on file  . Number of children: 2  . Years of education: Not on file  . Highest education level: Not on file  Occupational History  . Occupation: Best boy: [employer]  Social Needs  . Financial resource strain: Not on file  . Food insecurity    Worry: Not on file    Inability: Not on file  . Transportation needs    Medical: Not on file     Non-medical: Not on file  Tobacco Use  . Smoking status: Never Smoker  . Smokeless tobacco: Never Used  Substance and Sexual Activity  . Alcohol use: No  . Drug use: No  . Sexual activity: Yes    Comment: lives with husband and adult son, no major dietary restrictions, full diability  Lifestyle  . Physical activity    Days per week: Not on file    Minutes per session: Not on file  . Stress: Not on file  Relationships  . Social Herbalist on phone: Not on file    Gets together: Not on file    Attends religious service: Not on file    Active member of club or organization: Not on file    Attends meetings of clubs or organizations: Not on file    Relationship status: Not on file  . Intimate partner violence    Fear of current or ex partner: Not on file    Emotionally abused: Not on file    Physically abused: Not on file    Forced sexual activity: Not on file  Other Topics Concern  . Not on file  Social History Narrative  . Not on file    Outpatient Medications Prior to Visit  Medication Sig Dispense Refill  . amitriptyline (ELAVIL) 25 MG tablet Take 1 tablet (25 mg total) by mouth at bedtime.    Marland Kitchen esomeprazole (NEXIUM) 20 MG capsule Take 20 mg by mouth daily at 12 noon.    Marland Kitchen HYDROcodone-acetaminophen (NORCO) 5-325 MG tablet Take 1 tablet by mouth every 6 (six) hours as needed for moderate pain. 40 tablet 0  . Magnesium 400 MG CAPS Take 400 mg by mouth daily. Reported on 02/13/2016    . Methen-Hyosc-Meth Blue-Na Phos (UROGESIC-BLUE) 81.6 MG TABS Take 1 tablet (81.6 mg total) by mouth 4 (four) times daily as needed.    . Misc Natural Products (FIBER 7 PO) Take by mouth.    . promethazine (PHENERGAN) 25 MG tablet Take 1 tablet (25 mg total) by mouth every 8 (eight) hours as needed for nausea or vomiting. (Patient not taking: Reported on 01/14/2019) 20 tablet 0  . rizatriptan (MAXALT) 10 MG  tablet Take 10 mg by mouth as needed for migraine. May repeat in 2 hours if needed     . topiramate (TOPAMAX) 50 MG tablet Take 1 tablet (50 mg total) by mouth daily. Takes 75 mg daily 45 tablet 3  . Turmeric Curcumin 500 MG CAPS     . busPIRone (BUSPAR) 10 MG tablet TAKE 1 TABLET(10 MG) BY MOUTH THREE TIMES DAILY 90 tablet 0  . butalbital-acetaminophen-caffeine (FIORICET, ESGIC) 50-325-40 MG tablet Take 1 tablet by mouth 2 (two) times daily as needed for headache. 30 tablet 1  . DULoxetine (CYMBALTA) 60 MG capsule TAKE 1 CAPSULE (60 MG TOTAL) BY MOUTH 2 (TWO) TIMES DAILY. 180 capsule 1  . hydrOXYzine (ATARAX/VISTARIL) 25 MG tablet Take 1-2 tablets (25-50 mg total) by mouth at bedtime as needed. 30 tablet 0  . levothyroxine (SYNTHROID) 112 MCG tablet TAKE 1 TABLET BY MOUTH EVERY DAY 90 tablet 1  . losartan (COZAAR) 100 MG tablet TAKE 1 TABLET BY MOUTH EVERY DAY 90 tablet 0  . meloxicam (MOBIC) 15 MG tablet TAKE 1 TABLET BY MOUTH ONCE DAILY AS NEEDED FOR PAIN 90 tablet 0  . metoprolol succinate (TOPROL-XL) 100 MG 24 hr tablet TAKE 1 TABLET BY MOUTH TWICE DAILY TAKE WITH OR IMMEDIATELY FOLLOWING A MEAL 180 tablet 2  . metoprolol succinate (TOPROL-XL) 50 MG 24 hr tablet Take with or immediately following a meal. 90 tablet 1  . ondansetron (ZOFRAN) 4 MG tablet TAKE 1 TABLET BY MOUTH EVERY 6 TO 8 HOURS AS NEEDED (Patient not taking: Reported on 01/14/2019) 30 tablet 2  . simvastatin (ZOCOR) 40 MG tablet TAKE 1 TABLET BY MOUTH EVERYDAY AT BEDTIME 90 tablet 1   No facility-administered medications prior to visit.     Allergies  Allergen Reactions  . Ciprofloxacin Rash and Shortness Of Breath  . Sulfa Antibiotics Hives  . Sulfasalazine Hives  . Cortisone Other (See Comments)    Red flush and anxiety  . Tramadol Other (See Comments)    headache  . Zonisamide Other (See Comments)    unk  . Erythromycin Rash  . Erythromycin Base Rash  . Levofloxacin Nausea Only  . Lisinopril     Other reaction(s): Cough (ALLERGY/intolerance)  . Prednisone Rash and Anxiety    Review of Systems   Constitutional: Positive for malaise/fatigue. Negative for chills and fever.  HENT: Negative for congestion and hearing loss.   Eyes: Negative for discharge.  Respiratory: Negative for cough, sputum production and shortness of breath.   Cardiovascular: Negative for chest pain, palpitations and leg swelling.  Gastrointestinal: Positive for heartburn. Negative for abdominal pain, blood in stool, constipation, diarrhea, nausea and vomiting.  Genitourinary: Negative for dysuria, frequency, hematuria and urgency.  Musculoskeletal: Positive for back pain and joint pain. Negative for falls and myalgias.  Skin: Negative for rash.  Neurological: Negative for dizziness, sensory change, loss of consciousness, weakness and headaches.  Endo/Heme/Allergies: Negative for environmental allergies. Does not bruise/bleed easily.  Psychiatric/Behavioral: Positive for depression. Negative for suicidal ideas. The patient is nervous/anxious and has insomnia.        Objective:    Physical Exam Constitutional:      General: She is not in acute distress.    Appearance: She is not diaphoretic.  HENT:     Head: Normocephalic and atraumatic.     Right Ear: External ear normal.     Left Ear: External ear normal.     Nose: Nose normal.     Mouth/Throat:  Pharynx: No oropharyngeal exudate.  Eyes:     General: No scleral icterus.       Right eye: No discharge.        Left eye: No discharge.     Conjunctiva/sclera: Conjunctivae normal.     Pupils: Pupils are equal, round, and reactive to light.  Neck:     Musculoskeletal: Normal range of motion and neck supple.     Thyroid: No thyromegaly.  Cardiovascular:     Rate and Rhythm: Normal rate and regular rhythm.     Heart sounds: Normal heart sounds. No murmur.  Pulmonary:     Effort: Pulmonary effort is normal. No respiratory distress.     Breath sounds: Normal breath sounds. No wheezing or rales.  Abdominal:     General: Bowel sounds are normal. There  is no distension.     Palpations: Abdomen is soft. There is no mass.     Tenderness: There is no abdominal tenderness.  Genitourinary:    General: Normal vulva.     Rectum: Normal.  Musculoskeletal: Normal range of motion.        General: No tenderness.  Lymphadenopathy:     Cervical: No cervical adenopathy.  Skin:    General: Skin is warm and dry.     Findings: No rash.  Neurological:     Mental Status: She is alert and oriented to person, place, and time.     Cranial Nerves: No cranial nerve deficit.     Coordination: Coordination normal.     Deep Tendon Reflexes: Reflexes are normal and symmetric. Reflexes normal.     BP 132/80 (BP Location: Left Arm, Patient Position: Sitting, Cuff Size: Normal)   Pulse 72   Temp 97.6 F (36.4 C) (Oral)   Resp 18   Wt 204 lb (92.5 kg)   SpO2 98%   BMI 31.02 kg/m  Wt Readings from Last 3 Encounters:  05/18/19 204 lb (92.5 kg)  11/17/18 185 lb 3.2 oz (84 kg)  09/25/18 191 lb (86.6 kg)    Diabetic Foot Exam - Simple   No data filed     Lab Results  Component Value Date   WBC 6.8 05/18/2019   HGB 12.6 05/18/2019   HCT 39.1 05/18/2019   PLT 290.0 05/18/2019   GLUCOSE 88 05/18/2019   CHOL 230 (H) 05/18/2019   TRIG 83.0 05/18/2019   HDL 91.00 05/18/2019   LDLDIRECT 103.0 06/03/2017   LDLCALC 123 (H) 05/18/2019   ALT 12 05/18/2019   AST 13 05/18/2019   NA 137 05/18/2019   K 4.3 05/18/2019   CL 103 05/18/2019   CREATININE 0.97 05/18/2019   BUN 15 05/18/2019   CO2 26 05/18/2019   TSH 0.94 05/18/2019   HGBA1C 5.5 01/08/2018    Lab Results  Component Value Date   TSH 0.94 05/18/2019   Lab Results  Component Value Date   WBC 6.8 05/18/2019   HGB 12.6 05/18/2019   HCT 39.1 05/18/2019   MCV 87.1 05/18/2019   PLT 290.0 05/18/2019   Lab Results  Component Value Date   NA 137 05/18/2019   K 4.3 05/18/2019   CO2 26 05/18/2019   GLUCOSE 88 05/18/2019   BUN 15 05/18/2019   CREATININE 0.97 05/18/2019   BILITOT 0.6  05/18/2019   ALKPHOS 58 05/18/2019   AST 13 05/18/2019   ALT 12 05/18/2019   PROT 6.6 05/18/2019   ALBUMIN 4.4 05/18/2019   CALCIUM 9.4 05/18/2019   GFR 57.81 (L) 05/18/2019  Lab Results  Component Value Date   CHOL 230 (H) 05/18/2019   Lab Results  Component Value Date   HDL 91.00 05/18/2019   Lab Results  Component Value Date   LDLCALC 123 (H) 05/18/2019   Lab Results  Component Value Date   TRIG 83.0 05/18/2019   Lab Results  Component Value Date   CHOLHDL 3 05/18/2019   Lab Results  Component Value Date   HGBA1C 5.5 01/08/2018       Assessment & Plan:   Problem List Items Addressed This Visit    Hyperlipidemia    Encouraged heart healthy diet, increase exercise, avoid trans fats, consider a krill oil cap daily      Relevant Medications   losartan (COZAAR) 100 MG tablet   atorvastatin (LIPITOR) 40 MG tablet   Other Relevant Orders   Lipid panel (Completed)   Lipid panel   Hypertension    Well controlled, no changes to meds. Encouraged heart healthy diet such as the DASH diet and exercise as tolerated.       Relevant Medications   losartan (COZAAR) 100 MG tablet   atorvastatin (LIPITOR) 40 MG tablet   Other Relevant Orders   CBC (Completed)   Comprehensive metabolic panel (Completed)   CBC   Comprehensive metabolic panel   T3, free   T4, free   TSH   Obesity    Frustrated with the weight she has gained back. Encouraged DASH diet, decrease po intake and increase exercise as tolerated. Needs 7-8 hours of sleep nightly. Avoid trans fats, eat small, frequent meals every 4-5 hours with lean proteins, complex carbs and healthy fats. Minimize simple carbs, encouraged to try the Weight Watchers APP      Cervical cancer screening    Pap today, no concerns on exam.       Relevant Orders   Cytology - PAP( [place]) (Completed)   Depression with anxiety    She is noting her depression is worse during the pandemic but in the end she decides not to  change medications or accept a referral to a counselor. She will let us know if she changes her mind      Relevant Medications   hydrOXYzine (ATARAX/VISTARIL) 25 MG tablet   busPIRone (BUSPAR) 10 MG tablet   DULoxetine (CYMBALTA) 60 MG capsule   Preventative health care - Primary    Patient encouraged to maintain heart healthy diet, regular exercise, adequate sleep. Consider daily probiotics. Take medications as prescribed. Labs reviewed.       Educated About Covid-19 Virus Infection    She had a bad illness in the spring and wonders if she had COVID so antibody testing is run. She is encouraged to maintain masking, hand washing and social distancing.        Other Visit Diagnoses    Hyperthyroidism       Relevant Medications   levothyroxine (SYNTHROID) 125 MCG tablet   Other Relevant Orders   TSH (Completed)   T3, free (Completed)   T4, free (Completed)   T3, free   T4, free   TSH   Vitamin D deficiency       Relevant Orders   VITAMIN D 25 Hydroxy (Vit-D Deficiency, Fractures) (Completed)   COVID-19       Relevant Orders   SAR CoV2 Serology (COVID 19)AB(IGG)IA (Completed)      I have discontinued Burnett Corrente. Harriger's ondansetron, butalbital-acetaminophen-caffeine, metoprolol succinate, simvastatin, levothyroxine, metoprolol succinate, levothyroxine, and simvastatin. I have also changed  her losartan, hydrOXYzine, and meloxicam. Additionally, I am having her start on atorvastatin and levothyroxine. Lastly, I am having her maintain her rizatriptan, Magnesium, topiramate, promethazine, esomeprazole, Misc Natural Products (FIBER 7 PO), Turmeric Curcumin, Urogesic-Blue, amitriptyline, HYDROcodone-acetaminophen, busPIRone, and DULoxetine.  Meds ordered this encounter  Medications  . losartan (COZAAR) 100 MG tablet    Sig: Take 1 tablet (100 mg total) by mouth daily.    Dispense:  90 tablet    Refill:  1  . hydrOXYzine (ATARAX/VISTARIL) 25 MG tablet    Sig: Take 1 tablet (25 mg total)  by mouth at bedtime as needed.    Dispense:  90 tablet    Refill:  1  . busPIRone (BUSPAR) 10 MG tablet    Sig: TAKE 1 TABLET(10 MG) BY MOUTH THREE TIMES DAILY    Dispense:  90 tablet    Refill:  1  . DISCONTD: metoprolol succinate (TOPROL-XL) 50 MG 24 hr tablet    Sig: Take with or immediately following a meal.    Dispense:  90 tablet    Refill:  1  . DISCONTD: metoprolol succinate (TOPROL-XL) 100 MG 24 hr tablet    Sig: Take with or immediately following a meal.    Dispense:  90 tablet    Refill:  1  . DISCONTD: levothyroxine (SYNTHROID) 112 MCG tablet    Sig: Take 1 tablet (112 mcg total) by mouth daily.    Dispense:  90 tablet    Refill:  1  . meloxicam (MOBIC) 15 MG tablet    Sig: Take 1 tablet (15 mg total) by mouth daily as needed for pain.    Dispense:  90 tablet    Refill:  1  . DISCONTD: simvastatin (ZOCOR) 40 MG tablet    Sig: TAKE 1 TABLET BY MOUTH EVERYDAY AT BEDTIME    Dispense:  90 tablet    Refill:  1  . DULoxetine (CYMBALTA) 60 MG capsule    Sig: Take 1 capsule (60 mg total) by mouth 2 (two) times daily.    Dispense:  180 capsule    Refill:  1  . atorvastatin (LIPITOR) 40 MG tablet    Sig: Take 1 tablet (40 mg total) by mouth daily.    Dispense:  90 tablet    Refill:  3    D/C script for Simvastatin 40mg   . levothyroxine (SYNTHROID) 125 MCG tablet    Sig: Take 1 tablet (125 mcg total) by mouth daily.    Dispense:  90 tablet    Refill:  3    D/C previous script for levothyroxine 161mcg     Penni Homans, MD

## 2019-05-23 NOTE — Assessment & Plan Note (Signed)
Patient encouraged to maintain heart healthy diet, regular exercise, adequate sleep. Consider daily probiotics. Take medications as prescribed. Labs reviewed 

## 2019-06-09 ENCOUNTER — Encounter: Payer: Self-pay | Admitting: Family Medicine

## 2019-06-09 ENCOUNTER — Other Ambulatory Visit: Payer: Self-pay | Admitting: Family Medicine

## 2019-06-09 MED ORDER — HYDROCODONE-ACETAMINOPHEN 5-325 MG PO TABS: 1.0000 | ORAL_TABLET | Freq: Four times a day (QID) | ORAL | 0 refills | Status: DC | PRN

## 2019-07-19 ENCOUNTER — Encounter: Payer: Self-pay | Admitting: Family Medicine

## 2019-07-20 NOTE — Telephone Encounter (Signed)
Called patient set up for OV this Thursday

## 2019-07-22 ENCOUNTER — Ambulatory Visit (INDEPENDENT_AMBULATORY_CARE_PROVIDER_SITE_OTHER): Payer: PPO | Admitting: Family Medicine

## 2019-07-22 ENCOUNTER — Other Ambulatory Visit: Payer: Self-pay

## 2019-07-22 ENCOUNTER — Telehealth: Payer: Self-pay | Admitting: Family Medicine

## 2019-07-22 ENCOUNTER — Encounter: Payer: Self-pay | Admitting: Family Medicine

## 2019-07-22 DIAGNOSIS — E061 Subacute thyroiditis: Secondary | ICD-10-CM | POA: Diagnosis not present

## 2019-07-22 DIAGNOSIS — I10 Essential (primary) hypertension: Secondary | ICD-10-CM | POA: Diagnosis not present

## 2019-07-22 DIAGNOSIS — G4489 Other headache syndrome: Secondary | ICD-10-CM

## 2019-07-22 DIAGNOSIS — F418 Other specified anxiety disorders: Secondary | ICD-10-CM | POA: Diagnosis not present

## 2019-07-22 DIAGNOSIS — R5383 Other fatigue: Secondary | ICD-10-CM | POA: Diagnosis not present

## 2019-07-22 MED ORDER — LEVOTHYROXINE SODIUM 137 MCG PO CAPS: 137.0000 ug | ORAL_CAPSULE | Freq: Every day | ORAL | 3 refills | Status: DC

## 2019-07-22 NOTE — Telephone Encounter (Signed)
Pharmacy needs to get clarification on rx for Levothyroxine Sodium 137 MCG CAPS. Pt normally get tablets and capsules were sent in which are more expensive. Please advise.     [id number] [place], [place] (Phone) [phone] (Fax)

## 2019-07-23 MED ORDER — LEVOTHYROXINE SODIUM 137 MCG PO TABS: 137.0000 ug | ORAL_TABLET | Freq: Every day | ORAL | 1 refills | Status: DC

## 2019-07-23 NOTE — Telephone Encounter (Signed)
Resent tablet  medication in to patients pharmacy

## 2019-07-25 DIAGNOSIS — R519 Headache, unspecified: Secondary | ICD-10-CM | POA: Insufficient documentation

## 2019-07-25 NOTE — Assessment & Plan Note (Signed)
Will increase the Levothyroxine to 137 mcg daily and reevaluate with lab work

## 2019-07-25 NOTE — Assessment & Plan Note (Signed)
Monitor and report any concerning numbers.  no changes to meds. Encouraged heart healthy diet such as the DASH diet and exercise as tolerated.  

## 2019-07-25 NOTE — Assessment & Plan Note (Signed)
She has been very stressed by the pandemic and her children's concerns. Her son has finally moved out but her daughter has left an abusive marriage and is now living back at home. For now no change in treatment but she will let us know if her fatigue, anhedonia and lack of motivation does not improve.

## 2019-07-25 NOTE — Progress Notes (Signed)
Virtual Visit via Video Note  I connected with Alexandra Patterson on 07/22/19 at  2:00 PM EDT by a video enabled telemedicine application and verified that I am speaking with the correct person using two identifiers.  Location: Patient: home Provider: office   I discussed the limitations of evaluation and management by telemedicine and the availability of in person appointments. The patient expressed understanding and agreed to proceed. [name] CMA was able to get the patient set up on a video visit.    Subjective:    Patient ID: Alexandra Patterson, female    DOB: 14-Aug-1956, 63 y.o.   MRN: [id number]  No chief complaint on file.   HPI Patient is in today for follow up on chronic medical concerns including fibromyalgia, anxiety and depression, she was struggling with headaches but the Topamax has been helpful. She has not felt well this month and has been struggling with fatigue, malaise, headaches, myalgias and more. She notes her son has moved out but her daughter has moved in after leaving a bad marriage. No fevers or chills, no persistent respiratory symptoms. Denies CP/palp/SOB/congestion/fevers or GU c/o. Taking meds as prescribed  Past Medical History:  Diagnosis Date  . Allergy   . Anxiety   . Atrial fibrillation ([place])   . Cervical cancer screening 09/07/2015   Menarche at 11 Irregular and heavy and painful flow secondary to fibroids No history of abnormal pap in past, last pap roughly 2003 G2P2, s/p 2 SVD history of abnormal MGM, 1 abnl bx right breast benign, normal otherwise Noconcerns today TAH b/l SPO for fibroids and migrainesand breast bx on right gyn surgeries  . Cough 08/13/2015  . Depression   . Depression with anxiety 06/29/2016  . Fainting    once to due heart out of rythm  . Fibromyalgia   . Frequent headaches   . GERD (gastroesophageal reflux disease)   . 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 History of chicken pox   . Hx of blood clots    dvt  . Hyperlipidemia   .  Hyperplastic colon polyp   . Hypertension   . Insomnia 08/13/2015  . Internal hemorrhoids   . Lesion of lung    xray in 2014 thought to be benign seen at [facility] pulmonology  . Measles    h/o  . Migraine   . Obesity   . Preventative health care 02/03/2017  . S/P AVR (aortic valve replacement) 03/19/2015  . Shoulder pain 06/03/2017  . UTI (lower urinary tract infection)   . UTI (urinary tract infection) 02/11/2016    Past Surgical History:  Procedure Laterality Date  . ABDOMINAL HYSTERECTOMY     TAH SPO  . ATRIAL ABLATION SURGERY    . BREAST BIOPSY Right    Needle Biopsy  . DILATION AND CURETTAGE OF UTERUS     x 3  . loop heart    . SHOULDER SURGERY Left    arthroscopy for spurs  . TONSILLECTOMY    . TUBAL LIGATION      Family History  Problem Relation Age of Onset  . Colon polyps Father   . Alzheimer's disease Father   . Alcohol abuse Father   . Cancer Father        colon cancer  . Arthritis Mother        s/p TKR  . Neuropathy Mother   . Hyperlipidemia Mother   . Other Mother        familial mediterranean fever  . Thyroid  disease Mother   . Cancer Brother        prostate cancer  . Heart disease Brother        cardiomegaly  . Other Brother        familial mediterranean fever  . Asthma Maternal Grandmother   . Congestive Heart Failure Maternal Grandmother   . Heart disease Maternal Grandmother        chf  . Stroke Maternal Grandfather   . Diabetes Maternal Grandfather   . Heart disease Maternal Grandfather        hardening of the arteries  . Stroke Paternal Grandfather   . Atrial fibrillation Paternal Grandfather   . Alcohol abuse Paternal Grandfather   . Heart disease Paternal Grandfather        afib  . Interstitial cystitis Daughter   . Arthritis Son   . Alcohol abuse Son        in remission  . Mental illness Son        depression  . Other Son        interstitial cystitis  . Colon cancer Other        parent  . Arthritis Paternal Uncle      Social History   Socioeconomic History  . Marital status: Married    Spouse name: Not on file  . Number of children: 2  . Years of education: Not on file  . Highest education level: Not on file  Occupational History  . Occupation: Best boy: [employer]  Social Needs  . Financial resource strain: Not on file  . Food insecurity    Worry: Not on file    Inability: Not on file  . Transportation needs    Medical: Not on file    Non-medical: Not on file  Tobacco Use  . Smoking status: Never Smoker  . Smokeless tobacco: Never Used  Substance and Sexual Activity  . Alcohol use: No  . Drug use: No  . Sexual activity: Yes    Comment: lives with husband and adult son, no major dietary restrictions, full diability  Lifestyle  . Physical activity    Days per week: Not on file    Minutes per session: Not on file  . Stress: Not on file  Relationships  . Social Herbalist on phone: Not on file    Gets together: Not on file    Attends religious service: Not on file    Active member of club or organization: Not on file    Attends meetings of clubs or organizations: Not on file    Relationship status: Not on file  . Intimate partner violence    Fear of current or ex partner: Not on file    Emotionally abused: Not on file    Physically abused: Not on file    Forced sexual activity: Not on file  Other Topics Concern  . Not on file  Social History Narrative  . Not on file    Outpatient Medications Prior to Visit  Medication Sig Dispense Refill  . amitriptyline (ELAVIL) 25 MG tablet Take 1 tablet (25 mg total) by mouth at bedtime.    Marland Kitchen atorvastatin (LIPITOR) 40 MG tablet Take 1 tablet (40 mg total) by mouth daily. 90 tablet 3  . busPIRone (BUSPAR) 10 MG tablet TAKE 1 TABLET(10 MG) BY MOUTH THREE TIMES DAILY 90 tablet 1  . DULoxetine (CYMBALTA) 60 MG capsule Take 1 capsule (60 mg total) by  mouth 2 (two) times daily. 180 capsule 1  . esomeprazole  (NEXIUM) 20 MG capsule Take 20 mg by mouth daily at 12 noon.    Marland Kitchen HYDROcodone-acetaminophen (NORCO) 5-325 MG tablet Take 1 tablet by mouth every 6 (six) hours as needed for moderate pain. 40 tablet 0  . hydrOXYzine (ATARAX/VISTARIL) 25 MG tablet Take 1 tablet (25 mg total) by mouth at bedtime as needed. 90 tablet 1  . losartan (COZAAR) 100 MG tablet Take 1 tablet (100 mg total) by mouth daily. 90 tablet 1  . Magnesium 400 MG CAPS Take 400 mg by mouth daily. Reported on 02/13/2016    . meloxicam (MOBIC) 15 MG tablet Take 1 tablet (15 mg total) by mouth daily as needed for pain. 90 tablet 1  . Methen-Hyosc-Meth Blue-Na Phos (UROGESIC-BLUE) 81.6 MG TABS Take 1 tablet (81.6 mg total) by mouth 4 (four) times daily as needed.    . metoprolol succinate (TOPROL-XL) 100 MG 24 hr tablet Take 1 tablet (100 mg total) by mouth daily. Take with or immediately following a meal. 90 tablet 1  . Misc Natural Products (FIBER 7 PO) Take by mouth.    . rizatriptan (MAXALT) 10 MG tablet Take 10 mg by mouth as needed for migraine. May repeat in 2 hours if needed    . topiramate (TOPAMAX) 50 MG tablet Take 1 tablet (50 mg total) by mouth daily. Takes 75 mg daily 45 tablet 3  . Turmeric Curcumin 500 MG CAPS     . levothyroxine (SYNTHROID) 125 MCG tablet Take 1 tablet (125 mcg total) by mouth daily. 90 tablet 3  . promethazine (PHENERGAN) 25 MG tablet Take 1 tablet (25 mg total) by mouth every 8 (eight) hours as needed for nausea or vomiting. (Patient not taking: Reported on 01/14/2019) 20 tablet 0   No facility-administered medications prior to visit.     Allergies  Allergen Reactions  . Ciprofloxacin Rash and Shortness Of Breath  . Sulfa Antibiotics Hives  . Sulfasalazine Hives  . Cortisone Other (See Comments)    Red flush and anxiety  . Tramadol Other (See Comments)    headache  . Zonisamide Other (See Comments)    unk  . Erythromycin Rash  . Erythromycin Base Rash  . Levofloxacin Nausea Only  . Lisinopril      Other reaction(s): Cough (ALLERGY/intolerance)  . Prednisone Rash and Anxiety    Review of Systems  Constitutional: Positive for malaise/fatigue. Negative for fever.  HENT: Negative for congestion.   Eyes: Negative for blurred vision.  Respiratory: Negative for shortness of breath.   Cardiovascular: Negative for chest pain, palpitations and leg swelling.  Gastrointestinal: Negative for abdominal pain, blood in stool and nausea.  Genitourinary: Positive for frequency. Negative for dysuria.  Musculoskeletal: Positive for myalgias. Negative for falls.  Skin: Negative for rash.  Neurological: Negative for dizziness, loss of consciousness and headaches.  Endo/Heme/Allergies: Negative for environmental allergies.  Psychiatric/Behavioral: Negative for depression. The patient is not nervous/anxious.        Objective:    Physical Exam Vitals signs and nursing note reviewed.  Constitutional:      General: She is not in acute distress.    Appearance: She is well-developed.  HENT:     Head: Normocephalic and atraumatic.     Nose: Nose normal.  Eyes:     General:        Right eye: No discharge.        Left eye: No discharge.  Pulmonary:  Effort: Pulmonary effort is normal.  Neurological:     Mental Status: She is alert and oriented to person, place, and time.     Wt 211 lb (95.7 kg)   BMI 32.08 kg/m  Wt Readings from Last 3 Encounters:  07/22/19 211 lb (95.7 kg)  05/18/19 204 lb (92.5 kg)  11/17/18 185 lb 3.2 oz (84 kg)    Diabetic Foot Exam - Simple   No data filed     Lab Results  Component Value Date   WBC 6.8 05/18/2019   HGB 12.6 05/18/2019   HCT 39.1 05/18/2019   PLT 290.0 05/18/2019   GLUCOSE 88 05/18/2019   CHOL 230 (H) 05/18/2019   TRIG 83.0 05/18/2019   HDL 91.00 05/18/2019   LDLDIRECT 103.0 06/03/2017   LDLCALC 123 (H) 05/18/2019   ALT 12 05/18/2019   AST 13 05/18/2019   NA 137 05/18/2019   K 4.3 05/18/2019   CL 103 05/18/2019   CREATININE  0.97 05/18/2019   BUN 15 05/18/2019   CO2 26 05/18/2019   TSH 0.94 05/18/2019   HGBA1C 5.5 01/08/2018    Lab Results  Component Value Date   TSH 0.94 05/18/2019   Lab Results  Component Value Date   WBC 6.8 05/18/2019   HGB 12.6 05/18/2019   HCT 39.1 05/18/2019   MCV 87.1 05/18/2019   PLT 290.0 05/18/2019   Lab Results  Component Value Date   NA 137 05/18/2019   K 4.3 05/18/2019   CO2 26 05/18/2019   GLUCOSE 88 05/18/2019   BUN 15 05/18/2019   CREATININE 0.97 05/18/2019   BILITOT 0.6 05/18/2019   ALKPHOS 58 05/18/2019   AST 13 05/18/2019   ALT 12 05/18/2019   PROT 6.6 05/18/2019   ALBUMIN 4.4 05/18/2019   CALCIUM 9.4 05/18/2019   GFR 57.81 (L) 05/18/2019   Lab Results  Component Value Date   CHOL 230 (H) 05/18/2019   Lab Results  Component Value Date   HDL 91.00 05/18/2019   Lab Results  Component Value Date   LDLCALC 123 (H) 05/18/2019   Lab Results  Component Value Date   TRIG 83.0 05/18/2019   Lab Results  Component Value Date   CHOLHDL 3 05/18/2019   Lab Results  Component Value Date   HGBA1C 5.5 01/08/2018       Assessment & Plan:   Problem List Items Addressed This Visit    Hypertension    Monitor and report any concerning numbers. no changes to meds. Encouraged heart healthy diet such as the DASH diet and exercise as tolerated.       Thyroiditis, subacute    Will increase the Levothyroxine to 137 mcg daily and reevaluate with lab work      Depression with anxiety    She has been very stressed by the pandemic and her children's concerns. Her son has finally moved out but her daughter has left an abusive marriage and is now living back at home. For now no change in treatment but she will let us know if her fatigue, anhedonia and lack of motivation does not improve.       Fatigue    She notes her fatigue has been worse than usual recently. She did have some viral symptoms but those have largely resolved. She feels it may be a  fibromyalgia flare and/or stress/depression. She agrees to try and keep the stress as in check as possible and to stay active and maintain a heart healthy diet. She  will let us inow if does not improve.      Headache    Topamax has helped a great deal but she is concerned it may be contributing to her fatigue. We may have to consider cutting down on dosing.          I have discontinued Burnett Corrente. Brining's promethazine and levothyroxine. I am also having her maintain her rizatriptan, Magnesium, topiramate, esomeprazole, Misc Natural Products (FIBER 7 PO), Turmeric Curcumin, Urogesic-Blue, amitriptyline, losartan, hydrOXYzine, busPIRone, meloxicam, DULoxetine, atorvastatin, metoprolol succinate, and HYDROcodone-acetaminophen.  Meds ordered this encounter  Medications  . DISCONTD: Levothyroxine Sodium 137 MCG CAPS    Sig: Take 1 capsule (137 mcg total) by mouth daily before breakfast.    Dispense:  30 capsule    Refill:  3     I discussed the assessment and treatment plan with the patient. The patient was provided an opportunity to ask questions and all were answered. The patient agreed with the plan and demonstrated an understanding of the instructions.   The patient was advised to call back or seek an in-person evaluation if the symptoms worsen or if the condition fails to improve as anticipated.  I provided 25 minutes of non-face-to-face time during this encounter.   Penni Homans, MD

## 2019-07-25 NOTE — Assessment & Plan Note (Addendum)
She notes her fatigue has been worse than usual recently. She did have some viral symptoms but those have largely resolved. She feels it may be a fibromyalgia flare and/or stress/depression. She agrees to try and keep the stress as in check as possible and to stay active and maintain a heart healthy diet. She will let us inow if does not improve.

## 2019-07-25 NOTE — Assessment & Plan Note (Signed)
Topamax has helped a great deal but she is concerned it may be contributing to her fatigue. We may have to consider cutting down on dosing.

## 2019-07-28 ENCOUNTER — Encounter: Payer: Self-pay | Admitting: Family Medicine

## 2019-07-28 ENCOUNTER — Other Ambulatory Visit: Payer: Self-pay | Admitting: Family Medicine

## 2019-07-28 MED ORDER — HYDROCODONE-ACETAMINOPHEN 5-325 MG PO TABS: 1.0000 | ORAL_TABLET | Freq: Four times a day (QID) | ORAL | 0 refills | Status: DC | PRN

## 2019-07-29 ENCOUNTER — Ambulatory Visit (INDEPENDENT_AMBULATORY_CARE_PROVIDER_SITE_OTHER): Payer: PPO

## 2019-07-29 ENCOUNTER — Other Ambulatory Visit: Payer: Self-pay

## 2019-07-29 DIAGNOSIS — Z23 Encounter for immunization: Secondary | ICD-10-CM

## 2019-08-11 ENCOUNTER — Encounter: Payer: Self-pay | Admitting: Family Medicine

## 2019-08-12 ENCOUNTER — Other Ambulatory Visit: Payer: Self-pay | Admitting: *Deleted

## 2019-08-12 DIAGNOSIS — R42 Dizziness and giddiness: Secondary | ICD-10-CM

## 2019-08-12 DIAGNOSIS — R5383 Other fatigue: Secondary | ICD-10-CM

## 2019-08-12 DIAGNOSIS — M797 Fibromyalgia: Secondary | ICD-10-CM

## 2019-08-18 DIAGNOSIS — Z9889 Other specified postprocedural states: Secondary | ICD-10-CM | POA: Diagnosis not present

## 2019-08-18 DIAGNOSIS — Z95818 Presence of other cardiac implants and grafts: Secondary | ICD-10-CM | POA: Diagnosis not present

## 2019-08-18 DIAGNOSIS — Z952 Presence of prosthetic heart valve: Secondary | ICD-10-CM | POA: Diagnosis not present

## 2019-08-18 DIAGNOSIS — I4811 Longstanding persistent atrial fibrillation: Secondary | ICD-10-CM | POA: Diagnosis not present

## 2019-08-18 DIAGNOSIS — I480 Paroxysmal atrial fibrillation: Secondary | ICD-10-CM | POA: Diagnosis not present

## 2019-08-19 ENCOUNTER — Other Ambulatory Visit (INDEPENDENT_AMBULATORY_CARE_PROVIDER_SITE_OTHER): Payer: PPO

## 2019-08-19 ENCOUNTER — Other Ambulatory Visit: Payer: Self-pay | Admitting: Family Medicine

## 2019-08-19 ENCOUNTER — Other Ambulatory Visit: Payer: Self-pay

## 2019-08-19 DIAGNOSIS — E0590 Thyrotoxicosis, unspecified without thyrotoxic crisis or storm: Secondary | ICD-10-CM

## 2019-08-19 DIAGNOSIS — I10 Essential (primary) hypertension: Secondary | ICD-10-CM

## 2019-08-19 DIAGNOSIS — R42 Dizziness and giddiness: Secondary | ICD-10-CM | POA: Diagnosis not present

## 2019-08-19 DIAGNOSIS — R5383 Other fatigue: Secondary | ICD-10-CM

## 2019-08-19 DIAGNOSIS — M797 Fibromyalgia: Secondary | ICD-10-CM | POA: Diagnosis not present

## 2019-08-19 DIAGNOSIS — E785 Hyperlipidemia, unspecified: Secondary | ICD-10-CM

## 2019-08-19 LAB — TSH: TSH: 0.17 u[IU]/mL — ABNORMAL LOW (ref 0.35–4.50)

## 2019-08-19 LAB — LIPID PANEL
Cholesterol: 176 mg/dL (ref 0–200)
HDL: 76.5 mg/dL (ref >39.00)
LDL Cholesterol: 90 mg/dL (ref 0–99)
NonHDL: 99.41
Total CHOL/HDL Ratio: 2
Triglycerides: 47 mg/dL (ref 0.0–149.0)
VLDL: 9.4 mg/dL (ref 0.0–40.0)

## 2019-08-19 LAB — CBC
HCT: 37.6 % (ref 36.0–46.0)
Hemoglobin: 12.2 g/dL (ref 12.0–15.0)
MCHC: 32.4 g/dL (ref 30.0–36.0)
MCV: 83.9 fl (ref 78.0–100.0)
Platelets: 241 10*3/uL (ref 150.0–400.0)
RBC: 4.49 Mil/uL (ref 3.87–5.11)
RDW: 17.4 % — ABNORMAL HIGH (ref 11.5–15.5)
WBC: 4.4 10*3/uL (ref 4.0–10.5)

## 2019-08-19 LAB — COMPREHENSIVE METABOLIC PANEL
ALT: 18 U/L (ref 0–35)
AST: 16 U/L (ref 0–37)
Albumin: 4.2 g/dL (ref 3.5–5.2)
Alkaline Phosphatase: 54 U/L (ref 39–117)
BUN: 17 mg/dL (ref 6–23)
CO2: 25 mEq/L (ref 19–32)
Calcium: 9.2 mg/dL (ref 8.4–10.5)
Chloride: 103 mEq/L (ref 96–112)
Creatinine, Ser: 1.04 mg/dL (ref 0.40–1.20)
GFR: 53.3 mL/min — ABNORMAL LOW (ref >60.00)
Glucose, Bld: 88 mg/dL (ref 70–99)
Potassium: 4.9 mEq/L (ref 3.5–5.1)
Sodium: 136 mEq/L (ref 135–145)
Total Bilirubin: 0.6 mg/dL (ref 0.2–1.2)
Total Protein: 6.1 g/dL (ref 6.0–8.3)

## 2019-08-19 LAB — T4, FREE: Free T4: 1.11 ng/dL (ref 0.60–1.60)

## 2019-08-19 LAB — VITAMIN B12: Vitamin B-12: 484 pg/mL (ref 211–911)

## 2019-08-19 LAB — T3, FREE: T3, Free: 2.8 pg/mL (ref 2.3–4.2)

## 2019-08-23 DIAGNOSIS — I4891 Unspecified atrial fibrillation: Secondary | ICD-10-CM | POA: Diagnosis not present

## 2019-08-23 DIAGNOSIS — Z45018 Encounter for adjustment and management of other part of cardiac pacemaker: Secondary | ICD-10-CM | POA: Diagnosis not present

## 2019-08-23 DIAGNOSIS — Z4509 Encounter for adjustment and management of other cardiac device: Secondary | ICD-10-CM | POA: Diagnosis not present

## 2019-08-23 DIAGNOSIS — Z95818 Presence of other cardiac implants and grafts: Secondary | ICD-10-CM | POA: Diagnosis not present

## 2019-09-06 DIAGNOSIS — M797 Fibromyalgia: Secondary | ICD-10-CM | POA: Diagnosis not present

## 2019-09-06 DIAGNOSIS — M545 Low back pain: Secondary | ICD-10-CM | POA: Diagnosis not present

## 2019-09-06 DIAGNOSIS — M79604 Pain in right leg: Secondary | ICD-10-CM | POA: Diagnosis not present

## 2019-09-06 DIAGNOSIS — G43009 Migraine without aura, not intractable, without status migrainosus: Secondary | ICD-10-CM | POA: Diagnosis not present

## 2019-09-06 DIAGNOSIS — M79605 Pain in left leg: Secondary | ICD-10-CM | POA: Diagnosis not present

## 2019-09-16 ENCOUNTER — Other Ambulatory Visit: Payer: Self-pay | Admitting: Family Medicine

## 2019-09-16 ENCOUNTER — Encounter: Payer: Self-pay | Admitting: Family Medicine

## 2019-09-16 MED ORDER — HYDROCODONE-ACETAMINOPHEN 5-325 MG PO TABS: 1.0000 | ORAL_TABLET | Freq: Four times a day (QID) | ORAL | 0 refills | Status: DC | PRN

## 2019-09-26 ENCOUNTER — Encounter: Payer: Self-pay | Admitting: Family Medicine

## 2019-09-28 NOTE — Telephone Encounter (Signed)
LM requesting call back to discuss symptoms.  

## 2019-10-27 ENCOUNTER — Other Ambulatory Visit: Payer: Self-pay | Admitting: Family Medicine

## 2019-10-27 ENCOUNTER — Encounter: Payer: Self-pay | Admitting: Family Medicine

## 2019-10-27 MED ORDER — HYDROCODONE-ACETAMINOPHEN 5-325 MG PO TABS: 1.0000 | ORAL_TABLET | Freq: Four times a day (QID) | ORAL | 0 refills | Status: DC | PRN

## 2019-10-31 ENCOUNTER — Encounter: Payer: Self-pay | Admitting: Family Medicine

## 2019-11-01 ENCOUNTER — Other Ambulatory Visit: Payer: Self-pay | Admitting: Family Medicine

## 2019-11-01 ENCOUNTER — Telehealth: Payer: Self-pay | Admitting: Family Medicine

## 2019-11-01 ENCOUNTER — Encounter: Payer: Self-pay | Admitting: Family Medicine

## 2019-11-01 DIAGNOSIS — E0590 Thyrotoxicosis, unspecified without thyrotoxic crisis or storm: Secondary | ICD-10-CM

## 2019-11-01 MED ORDER — METOPROLOL SUCCINATE ER 50 MG PO TB24: ORAL_TABLET | ORAL | 1 refills | Status: DC

## 2019-11-01 MED ORDER — METOPROLOL SUCCINATE ER 100 MG PO TB24: 100.0000 mg | ORAL_TABLET | Freq: Every day | ORAL | 1 refills | Status: DC

## 2019-11-01 NOTE — Telephone Encounter (Signed)
Medication sent in. 

## 2019-11-01 NOTE — Telephone Encounter (Signed)
Medication: metoprolol succinate (TOPROL-XL) 100 MG 24 hr tablet  Has the patient contacted their pharmacy? Yes.   (If no, request that the patient contact the pharmacy for the refill.) (If yes, when and what did the pharmacy advise?)   Pharmacy calling for refill states it was a problem with fax going thru .   Preferred Pharmacy (with phone number or street name): [facility]  ( [place] )   Agent: Please be advised that RX refills may take up to 3 business days. We ask that you follow-up with your pharmacy.

## 2019-11-02 ENCOUNTER — Telehealth: Payer: Self-pay

## 2019-11-02 MED ORDER — METOPROLOL SUCCINATE ER 50 MG PO TB24: 100.0000 mg | ORAL_TABLET | Freq: Every day | ORAL | 1 refills | Status: DC

## 2019-11-02 NOTE — Telephone Encounter (Incomplete)
metoprolol succinate (TOPROL-XL)  Medication: ***  Has the patient contacted their pharmacy? {yes [id number]} (If no, request that the patient contact the pharmacy for the refill.) (If yes, when and what did the pharmacy advise?)  Preferred Pharmacy (with phone number or street name): ***  Agent: Please be advised that RX refills may take up to 3 business days. We ask that you follow-up with your pharmacy.

## 2019-11-02 NOTE — Telephone Encounter (Signed)
  She probably needs an appt. You can technically take up to 450 mg in a day but we need to discuss vital signs and how she feels. Can be virtual if she can check her vitals.

## 2019-11-02 NOTE — Telephone Encounter (Signed)
Spoke with pharmacist Ronalee Belts from [facility] he stated patient is taking 250mg  of Metoprolol XL  He stated the patient told him Cardiology had her taking 100mg  Daily and Dr. [name] has her taking 50mg  BID, He says somewhere along the way she has doubled the 100mg  metoprolol and only taking the 50mg  daily, totaling 250mg  daily  Please advise

## 2019-11-02 NOTE — Telephone Encounter (Signed)
Called patient left message for patient to call the office back for an appt  Alexandra Patterson can you reach out to patient to see if we can get a virtual call set up with her and [name]

## 2019-11-04 NOTE — Telephone Encounter (Signed)
Pt is scheduled for 2/18 - if she needs sooner where can she be fit in?

## 2019-11-06 ENCOUNTER — Other Ambulatory Visit: Payer: Self-pay | Admitting: Family Medicine

## 2019-11-16 DIAGNOSIS — M1711 Unilateral primary osteoarthritis, right knee: Secondary | ICD-10-CM | POA: Diagnosis not present

## 2019-11-16 DIAGNOSIS — M1712 Unilateral primary osteoarthritis, left knee: Secondary | ICD-10-CM | POA: Diagnosis not present

## 2019-11-18 ENCOUNTER — Ambulatory Visit (INDEPENDENT_AMBULATORY_CARE_PROVIDER_SITE_OTHER): Payer: PPO | Admitting: Family Medicine

## 2019-11-18 ENCOUNTER — Other Ambulatory Visit: Payer: Self-pay

## 2019-11-18 VITALS — BP 124/81

## 2019-11-18 DIAGNOSIS — E061 Subacute thyroiditis: Secondary | ICD-10-CM

## 2019-11-18 DIAGNOSIS — E559 Vitamin D deficiency, unspecified: Secondary | ICD-10-CM | POA: Diagnosis not present

## 2019-11-18 DIAGNOSIS — E785 Hyperlipidemia, unspecified: Secondary | ICD-10-CM

## 2019-11-18 DIAGNOSIS — M797 Fibromyalgia: Secondary | ICD-10-CM | POA: Diagnosis not present

## 2019-11-18 DIAGNOSIS — F418 Other specified anxiety disorders: Secondary | ICD-10-CM | POA: Diagnosis not present

## 2019-11-18 DIAGNOSIS — I4891 Unspecified atrial fibrillation: Secondary | ICD-10-CM | POA: Diagnosis not present

## 2019-11-18 DIAGNOSIS — I10 Essential (primary) hypertension: Secondary | ICD-10-CM | POA: Diagnosis not present

## 2019-11-18 DIAGNOSIS — E669 Obesity, unspecified: Secondary | ICD-10-CM | POA: Diagnosis not present

## 2019-11-18 DIAGNOSIS — G4700 Insomnia, unspecified: Secondary | ICD-10-CM

## 2019-11-18 MED ORDER — DULOXETINE HCL 60 MG PO CPEP: 60.0000 mg | ORAL_CAPSULE | Freq: Two times a day (BID) | ORAL | 1 refills | Status: DC

## 2019-11-18 MED ORDER — LEVOTHYROXINE SODIUM 137 MCG PO TABS: ORAL_TABLET | ORAL | 1 refills | Status: DC

## 2019-11-18 MED ORDER — ATORVASTATIN CALCIUM 40 MG PO TABS: 40.0000 mg | ORAL_TABLET | Freq: Every day | ORAL | 3 refills | Status: DC

## 2019-11-18 MED ORDER — BUSPIRONE HCL 10 MG PO TABS: 10.0000 mg | ORAL_TABLET | Freq: Three times a day (TID) | ORAL | 1 refills | Status: DC

## 2019-11-18 MED ORDER — HYDROXYZINE HCL 25 MG PO TABS: 25.0000 mg | ORAL_TABLET | Freq: Every evening | ORAL | 1 refills | Status: AC | PRN

## 2019-11-18 MED ORDER — LOSARTAN POTASSIUM 100 MG PO TABS: 100.0000 mg | ORAL_TABLET | Freq: Every day | ORAL | 1 refills | Status: DC

## 2019-11-18 NOTE — Assessment & Plan Note (Signed)
Encouraged DASH diet, decrease po intake and increase exercise as tolerated. Needs 7-8 hours of sleep nightly. Avoid trans fats, eat small, frequent meals every 4-5 hours with lean proteins, complex carbs and healthy fats. Minimize simple carbs, referred to [facility]

## 2019-11-18 NOTE — Patient Instructions (Signed)
Omron Blood Pressure cuff, upper arm, want BP 100-140/60-90 Pulse oximeter, want oxygen in 90s  Weekly vitals  Take Multivitamin with minerals, selenium Vitamin D 1000-2000 IU daily Probiotic with lactobacillus and bifidophilus Asprin EC 81 mg daily  Melatonin 2-5 mg at bedtime  [email or web address] 

## 2019-11-18 NOTE — Assessment & Plan Note (Signed)
She has been struggling with increased pain and debility this winter as she has been quarantined at home. She is offered counseling and declines for now but she can continue current meds and she agrees to referral for weight loss.

## 2019-11-18 NOTE — Assessment & Plan Note (Signed)
Supplement and monitor 

## 2019-11-18 NOTE — Assessment & Plan Note (Signed)
She is struggling despite Cymbalta bid, she will let us know if she is ready for medication adjustment and/or referral to psychiatry or counseling

## 2019-11-18 NOTE — Assessment & Plan Note (Signed)
Monitor and report any concerns. no changes to meds. Encouraged heart healthy diet such as the DASH diet and exercise as tolerated.  

## 2019-11-18 NOTE — Assessment & Plan Note (Signed)
Encouraged heart healthy diet, increase exercise, avoid trans fats, consider a krill oil cap daily 

## 2019-11-18 NOTE — Progress Notes (Signed)
Virtual Visit via Video Note  I connected with Alexandra Patterson on 11/18/19 at 10:20 AM EST by a video enabled telemedicine application and verified that I am speaking with the correct person using two identifiers.  Location: Patient: home Provider: home   I discussed the limitations of evaluation and management by telemedicine and the availability of in person appointments. The patient expressed understanding and agreed to proceed. Magdalene Molly, CMA was able to set up the visit, video   Subjective:    Patient ID: Alexandra Patterson, female    DOB: [birth year], 64 y.o.   MRN: [id number]  No chief complaint on file.   HPI Patient is in today for follow up on chronic medical concerns. No recent febrile illness or hospitalizations. She is having a very hard time with chronic pain she has had diffuse arthralgias and myalgias. No falls or trauma. She has been maintaining quarantine well but that has contributed to anxiety and depression. She also notes eating badly and is frustrated with weight gain. Denies CP/palp/SOB/HA/congestion/fevers/GI or GU c/o. Taking meds as prescribed  Past Medical History:  Diagnosis Date  . Allergy   . Anxiety   . Atrial fibrillation ([place])   . Cervical cancer screening 09/07/2015   Menarche at 11 Irregular and heavy and painful flow secondary to fibroids No history of abnormal pap in past, last pap roughly 2003 G2P2, s/p 2 SVD history of abnormal MGM, 1 abnl bx right breast benign, normal otherwise Noconcerns today TAH b/l SPO for fibroids and migrainesand breast bx on right gyn surgeries  . Cough 08/13/2015  . Depression   . Depression with anxiety 06/29/2016  . Fainting    once to due heart out of rythm  . Fibromyalgia   . Frequent headaches   . GERD (gastroesophageal reflux disease)   . 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 History of chicken pox   . Hx of blood clots    dvt  . Hyperlipidemia   . Hyperplastic colon polyp   . Hypertension   . Insomnia 08/13/2015    . Internal hemorrhoids   . Lesion of lung    xray in 2014 thought to be benign seen at [facility] pulmonology  . Measles    h/o  . Migraine   . Obesity   . Preventative health care 02/03/2017  . S/P AVR (aortic valve replacement) 03/19/2015  . Shoulder pain 06/03/2017  . UTI (lower urinary tract infection)   . UTI (urinary tract infection) 02/11/2016    Past Surgical History:  Procedure Laterality Date  . ABDOMINAL HYSTERECTOMY     TAH SPO  . ATRIAL ABLATION SURGERY    . BREAST BIOPSY Right    Needle Biopsy  . DILATION AND CURETTAGE OF UTERUS     x 3  . loop heart    . SHOULDER SURGERY Left    arthroscopy for spurs  . TONSILLECTOMY    . TUBAL LIGATION      Family History  Problem Relation Age of Onset  . Colon polyps Father   . Alzheimer's disease Father   . Alcohol abuse Father   . Cancer Father        colon cancer  . Arthritis Mother        s/p TKR  . Neuropathy Mother   . Hyperlipidemia Mother   . Other Mother        familial mediterranean fever  . Thyroid disease Mother   . Cancer Brother  prostate cancer  . Heart disease Brother        cardiomegaly  . Other Brother        familial mediterranean fever  . Asthma Maternal Grandmother   . Congestive Heart Failure Maternal Grandmother   . Heart disease Maternal Grandmother        chf  . Stroke Maternal Grandfather   . Diabetes Maternal Grandfather   . Heart disease Maternal Grandfather        hardening of the arteries  . Stroke Paternal Grandfather   . Atrial fibrillation Paternal Grandfather   . Alcohol abuse Paternal Grandfather   . Heart disease Paternal Grandfather        afib  . Interstitial cystitis Daughter   . Arthritis Son   . Alcohol abuse Son        in remission  . Mental illness Son        depression  . Other Son        interstitial cystitis  . Colon cancer Other        parent  . Arthritis Paternal Uncle     Social History   Socioeconomic History  . Marital status: Married     Spouse name: Not on file  . Number of children: 2  . Years of education: Not on file  . Highest education level: Not on file  Occupational History  . Occupation: Best boy: [employer]  Tobacco Use  . Smoking status: Never Smoker  . Smokeless tobacco: Never Used  Substance and Sexual Activity  . Alcohol use: No  . Drug use: No  . Sexual activity: Yes    Comment: lives with husband and adult son, no major dietary restrictions, full diability  Other Topics Concern  . Not on file  Social History Narrative  . Not on file   Social Determinants of Health   Financial Resource Strain:   . Difficulty of Paying Living Expenses: Not on file  Food Insecurity:   . Worried About Charity fundraiser in the Last Year: Not on file  . Ran Out of Food in the Last Year: Not on file  Transportation Needs:   . Lack of Transportation (Medical): Not on file  . Lack of Transportation (Non-Medical): Not on file  Physical Activity:   . Days of Exercise per Week: Not on file  . Minutes of Exercise per Session: Not on file  Stress:   . Feeling of Stress : Not on file  Social Connections:   . Frequency of Communication with Friends and Family: Not on file  . Frequency of Social Gatherings with Friends and Family: Not on file  . Attends Religious Services: Not on file  . Active Member of Clubs or Organizations: Not on file  . Attends Archivist Meetings: Not on file  . Marital Status: Not on file  Intimate Partner Violence:   . Fear of Current or Ex-Partner: Not on file  . Emotionally Abused: Not on file  . Physically Abused: Not on file  . Sexually Abused: Not on file    Outpatient Medications Prior to Visit  Medication Sig Dispense Refill  . amitriptyline (ELAVIL) 25 MG tablet Take 1 tablet (25 mg total) by mouth at bedtime.    Marland Kitchen esomeprazole (NEXIUM) 20 MG capsule Take 20 mg by mouth daily at 12 noon.    Marland Kitchen HYDROcodone-acetaminophen (NORCO) 5-325 MG  tablet Take 1 tablet by mouth every 6 (six) hours as  needed for moderate pain. 40 tablet 0  . Magnesium 400 MG CAPS Take 400 mg by mouth daily. Reported on 02/13/2016    . meloxicam (MOBIC) 15 MG tablet TAKE 1 TABLET BY MOUTH EVERY DAY AS NEEDED FOR PAIN 90 tablet 1  . Methen-Hyosc-Meth Blue-Na Phos (UROGESIC-BLUE) 81.6 MG TABS Take 1 tablet (81.6 mg total) by mouth 4 (four) times daily as needed.    . metoprolol succinate (TOPROL-XL) 50 MG 24 hr tablet Take 2 tablets (100 mg total) by mouth daily. Take with or immediately following a meal. 90 tablet 1  . Misc Natural Products (FIBER 7 PO) Take by mouth.    . rizatriptan (MAXALT) 10 MG tablet Take 10 mg by mouth as needed for migraine. May repeat in 2 hours if needed    . Turmeric Curcumin 500 MG CAPS     . atorvastatin (LIPITOR) 40 MG tablet Take 1 tablet (40 mg total) by mouth daily. 90 tablet 3  . busPIRone (BUSPAR) 10 MG tablet TAKE 1 TABLET(10 MG) BY MOUTH THREE TIMES DAILY 270 tablet 1  . DULoxetine (CYMBALTA) 60 MG capsule Take 1 capsule (60 mg total) by mouth 2 (two) times daily. 180 capsule 1  . hydrOXYzine (ATARAX/VISTARIL) 25 MG tablet Take 1 tablet (25 mg total) by mouth at bedtime as needed. 90 tablet 1  . levothyroxine (SYNTHROID) 137 MCG tablet Take 1 tablet (137 mcg total) by mouth daily before breakfast. 90 tablet 1  . losartan (COZAAR) 100 MG tablet Take 1 tablet (100 mg total) by mouth daily. 90 tablet 1  . topiramate (TOPAMAX) 50 MG tablet Take 1 tablet (50 mg total) by mouth daily. Takes 75 mg daily 45 tablet 3  . topiramate (TOPAMAX) 100 MG tablet Take 1 tablet (100 mg total) by mouth 2 (two) times daily.     No facility-administered medications prior to visit.    Allergies  Allergen Reactions  . Ciprofloxacin Rash and Shortness Of Breath  . Sulfa Antibiotics Hives  . Sulfasalazine Hives  . Cortisone Other (See Comments)    Red flush and anxiety  . Tramadol Other (See Comments)    headache  . Zonisamide Other (See  Comments)    unk  . Erythromycin Rash  . Erythromycin Base Rash  . Levofloxacin Nausea Only  . Lisinopril     Other reaction(s): Cough (ALLERGY/intolerance)  . Prednisone Rash and Anxiety    Review of Systems  Constitutional: Positive for malaise/fatigue. Negative for fever.  HENT: Negative for congestion.   Eyes: Negative for blurred vision.  Respiratory: Negative for shortness of breath.   Cardiovascular: Negative for chest pain, palpitations and leg swelling.  Gastrointestinal: Negative for abdominal pain, blood in stool and nausea.  Genitourinary: Negative for dysuria and frequency.  Musculoskeletal: Positive for back pain, joint pain, myalgias and neck pain. Negative for falls.  Skin: Negative for rash.  Neurological: Negative for dizziness, loss of consciousness and headaches.  Endo/Heme/Allergies: Negative for environmental allergies.  Psychiatric/Behavioral: Positive for depression. The patient is nervous/anxious and has insomnia.        Objective:    Physical Exam Constitutional:      Appearance: Normal appearance. She is obese. She is not ill-appearing.  HENT:     Head: Normocephalic and atraumatic.     Right Ear: External ear normal.     Left Ear: External ear normal.     Nose: Nose normal.  Pulmonary:     Effort: Pulmonary effort is normal.  Neurological:  Mental Status: She is alert and oriented to person, place, and time.  Psychiatric:        Behavior: Behavior normal.     BP 124/81 (BP Location: Left Arm, Patient Position: Sitting, Cuff Size: Normal)  Wt Readings from Last 3 Encounters:  07/22/19 211 lb (95.7 kg)  05/18/19 204 lb (92.5 kg)  11/17/18 185 lb 3.2 oz (84 kg)    Diabetic Foot Exam - Simple   No data filed     Lab Results  Component Value Date   WBC 4.4 08/19/2019   HGB 12.2 08/19/2019   HCT 37.6 08/19/2019   PLT 241.0 08/19/2019   GLUCOSE 88 08/19/2019   CHOL 176 08/19/2019   TRIG 47.0 08/19/2019   HDL 76.50 08/19/2019    LDLDIRECT 103.0 06/03/2017   LDLCALC 90 08/19/2019   ALT 18 08/19/2019   AST 16 08/19/2019   NA 136 08/19/2019   K 4.9 08/19/2019   CL 103 08/19/2019   CREATININE 1.04 08/19/2019   BUN 17 08/19/2019   CO2 25 08/19/2019   TSH 0.17 (L) 08/19/2019   HGBA1C 5.5 01/08/2018    Lab Results  Component Value Date   TSH 0.17 (L) 08/19/2019   Lab Results  Component Value Date   WBC 4.4 08/19/2019   HGB 12.2 08/19/2019   HCT 37.6 08/19/2019   MCV 83.9 08/19/2019   PLT 241.0 08/19/2019   Lab Results  Component Value Date   NA 136 08/19/2019   K 4.9 08/19/2019   CO2 25 08/19/2019   GLUCOSE 88 08/19/2019   BUN 17 08/19/2019   CREATININE 1.04 08/19/2019   BILITOT 0.6 08/19/2019   ALKPHOS 54 08/19/2019   AST 16 08/19/2019   ALT 18 08/19/2019   PROT 6.1 08/19/2019   ALBUMIN 4.2 08/19/2019   CALCIUM 9.2 08/19/2019   GFR 53.30 (L) 08/19/2019   Lab Results  Component Value Date   CHOL 176 08/19/2019   Lab Results  Component Value Date   HDL 76.50 08/19/2019   Lab Results  Component Value Date   LDLCALC 90 08/19/2019   Lab Results  Component Value Date   TRIG 47.0 08/19/2019   Lab Results  Component Value Date   CHOLHDL 2 08/19/2019   Lab Results  Component Value Date   HGBA1C 5.5 01/08/2018       Assessment & Plan:   Problem List Items Addressed This Visit    Fibromyalgia    She has been struggling with increased pain and debility this winter as she has been quarantined at home. She is offered counseling and declines for now but she can continue current meds and she agrees to referral for weight loss.       Relevant Medications   DULoxetine (CYMBALTA) 60 MG capsule   topiramate (TOPAMAX) 100 MG tablet   Hyperlipidemia    Encouraged heart healthy diet, increase exercise, avoid trans fats, consider a krill oil cap daily      Relevant Medications   losartan (COZAAR) 100 MG tablet   atorvastatin (LIPITOR) 40 MG tablet   Other Relevant Orders   Lipid panel    Hypertension    Monitor and report any concerns no changes to meds. Encouraged heart healthy diet such as the DASH diet and exercise as tolerated.       Relevant Medications   losartan (COZAAR) 100 MG tablet   atorvastatin (LIPITOR) 40 MG tablet   Other Relevant Orders   CBC   Comprehensive metabolic panel  Atrial fibrillation (HCC)    Asymptomatic, tolerating meds      Relevant Medications   losartan (COZAAR) 100 MG tablet   atorvastatin (LIPITOR) 40 MG tablet   Obesity - Primary    Encouraged DASH diet, decrease po intake and increase exercise as tolerated. Needs 7-8 hours of sleep nightly. Avoid trans fats, eat small, frequent meals every 4-5 hours with lean proteins, complex carbs and healthy fats. Minimize simple carbs, referred to [facility]      Relevant Orders   Amb Ref to Medical Weight Management   Insomnia    Encouraged good sleep hygiene such as dark, quiet room. No blue/green glowing lights such as computer screens in bedroom. No alcohol or stimulants in evening. Cut down on caffeine as able. Regular exercise is helpful but not just prior to bed time. Continue Amitriptyline and add Melatonin      Thyroiditis, subacute   Relevant Medications   levothyroxine (SYNTHROID) 137 MCG tablet   Other Relevant Orders   TSH   T4, free   T3, free   Depression with anxiety    She is struggling despite Cymbalta bid, she will let us know if she is ready for medication adjustment and/or referral to psychiatry or counseling      Relevant Medications   hydrOXYzine (ATARAX/VISTARIL) 25 MG tablet   busPIRone (BUSPAR) 10 MG tablet   DULoxetine (CYMBALTA) 60 MG capsule   Vitamin D deficiency    Supplement and monitor      Relevant Orders   VITAMIN D 25 Hydroxy (Vit-D Deficiency, Fractures)      I have changed Burnett Corrente. Schrier's busPIRone and levothyroxine. I am also having her maintain her rizatriptan, Magnesium, esomeprazole, Misc Natural Products (FIBER 7  PO), Turmeric Curcumin, Urogesic-Blue, amitriptyline, HYDROcodone-acetaminophen, metoprolol succinate, meloxicam, losartan, hydrOXYzine, atorvastatin, DULoxetine, and topiramate.  Meds ordered this encounter  Medications  . losartan (COZAAR) 100 MG tablet    Sig: Take 1 tablet (100 mg total) by mouth daily.    Dispense:  90 tablet    Refill:  1  . hydrOXYzine (ATARAX/VISTARIL) 25 MG tablet    Sig: Take 1 tablet (25 mg total) by mouth at bedtime as needed.    Dispense:  90 tablet    Refill:  1  . busPIRone (BUSPAR) 10 MG tablet    Sig: Take 1 tablet (10 mg total) by mouth 3 (three) times daily.    Dispense:  270 tablet    Refill:  1  . atorvastatin (LIPITOR) 40 MG tablet    Sig: Take 1 tablet (40 mg total) by mouth daily.    Dispense:  90 tablet    Refill:  3    D/C script for Simvastatin 40mg   . DULoxetine (CYMBALTA) 60 MG capsule    Sig: Take 1 capsule (60 mg total) by mouth 2 (two) times daily.    Dispense:  180 capsule    Refill:  1  . levothyroxine (SYNTHROID) 137 MCG tablet    Sig: 1 tab daily as directed except no dose on Sunday    Dispense:  90 tablet    Refill:  1    D/C previous script for capsules      I discussed the assessment and treatment plan with the patient. The patient was provided an opportunity to ask questions and all were answered. The patient agreed with the plan and demonstrated an understanding of th2e instructions.   The patient was advised to call back or seek an  in-person evaluation if the symptoms worsen or if the condition fails to improve as anticipated.  I provided 35 minutes of non-face-to-face time during this encounter.   Penni Homans, MD

## 2019-11-18 NOTE — Assessment & Plan Note (Signed)
>>  ASSESSMENT AND PLAN FOR CLASS 2 SEVERE OBESITY WITH SERIOUS COMORBIDITY AND BODY MASS INDEX (BMI) OF 36.0 TO 36.9 IN ADULT, UNSPECIFIED OBESITY TYPE (HCC) WRITTEN ON 11/18/2019  9:48 PM BY BLYTH, STACEY A, MD  Encouraged DASH diet, decrease po intake and increase exercise as tolerated. Needs 7-8 hours of sleep nightly. Avoid trans fats, eat small, frequent meals every 4-5 hours with lean proteins, complex carbs and healthy fats. Minimize simple carbs, referred to [facility]

## 2019-11-18 NOTE — Assessment & Plan Note (Signed)
Encouraged good sleep hygiene such as dark, quiet room. No blue/green glowing lights such as computer screens in bedroom. No alcohol or stimulants in evening. Cut down on caffeine as able. Regular exercise is helpful but not just prior to bed time. Continue Amitriptyline and add Melatonin

## 2019-11-18 NOTE — Assessment & Plan Note (Signed)
Asymptomatic, tolerating meds. 

## 2019-11-23 ENCOUNTER — Other Ambulatory Visit (INDEPENDENT_AMBULATORY_CARE_PROVIDER_SITE_OTHER): Payer: PPO

## 2019-11-23 ENCOUNTER — Other Ambulatory Visit: Payer: Self-pay

## 2019-11-23 DIAGNOSIS — E061 Subacute thyroiditis: Secondary | ICD-10-CM

## 2019-11-23 DIAGNOSIS — E559 Vitamin D deficiency, unspecified: Secondary | ICD-10-CM | POA: Diagnosis not present

## 2019-11-23 DIAGNOSIS — E785 Hyperlipidemia, unspecified: Secondary | ICD-10-CM | POA: Diagnosis not present

## 2019-11-23 DIAGNOSIS — I10 Essential (primary) hypertension: Secondary | ICD-10-CM

## 2019-11-23 LAB — COMPREHENSIVE METABOLIC PANEL
ALT: 19 U/L (ref 0–35)
AST: 15 U/L (ref 0–37)
Albumin: 4.3 g/dL (ref 3.5–5.2)
Alkaline Phosphatase: 56 U/L (ref 39–117)
BUN: 20 mg/dL (ref 6–23)
CO2: 24 mEq/L (ref 19–32)
Calcium: 9.3 mg/dL (ref 8.4–10.5)
Chloride: 103 mEq/L (ref 96–112)
Creatinine, Ser: 1.05 mg/dL (ref 0.40–1.20)
GFR: 52.67 mL/min — ABNORMAL LOW (ref >60.00)
Glucose, Bld: 97 mg/dL (ref 70–99)
Potassium: 4.7 mEq/L (ref 3.5–5.1)
Sodium: 135 mEq/L (ref 135–145)
Total Bilirubin: 0.5 mg/dL (ref 0.2–1.2)
Total Protein: 6.2 g/dL (ref 6.0–8.3)

## 2019-11-23 LAB — T3, FREE: T3, Free: 2.6 pg/mL (ref 2.3–4.2)

## 2019-11-23 LAB — CBC
HCT: 35.1 % — ABNORMAL LOW (ref 36.0–46.0)
Hemoglobin: 11.1 g/dL — ABNORMAL LOW (ref 12.0–15.0)
MCHC: 31.7 g/dL (ref 30.0–36.0)
MCV: 83 fl (ref 78.0–100.0)
Platelets: 229 10*3/uL (ref 150.0–400.0)
RBC: 4.23 Mil/uL (ref 3.87–5.11)
RDW: 16.7 % — ABNORMAL HIGH (ref 11.5–15.5)
WBC: 4 10*3/uL (ref 4.0–10.5)

## 2019-11-23 LAB — T4, FREE: Free T4: 0.77 ng/dL (ref 0.60–1.60)

## 2019-11-23 LAB — LIPID PANEL
Cholesterol: 196 mg/dL (ref 0–200)
HDL: 71.5 mg/dL (ref >39.00)
LDL Cholesterol: 107 mg/dL — ABNORMAL HIGH (ref 0–99)
NonHDL: 124.04
Total CHOL/HDL Ratio: 3
Triglycerides: 87 mg/dL (ref 0.0–149.0)
VLDL: 17.4 mg/dL (ref 0.0–40.0)

## 2019-11-23 LAB — VITAMIN D 25 HYDROXY (VIT D DEFICIENCY, FRACTURES): VITD: 61.43 ng/mL (ref 30.00–100.00)

## 2019-11-23 LAB — TSH: TSH: 0.92 u[IU]/mL (ref 0.35–4.50)

## 2019-11-30 ENCOUNTER — Encounter: Payer: Self-pay | Admitting: Family Medicine

## 2019-11-30 ENCOUNTER — Other Ambulatory Visit: Payer: Self-pay | Admitting: Family Medicine

## 2019-11-30 MED ORDER — HYDROCODONE-ACETAMINOPHEN 5-325 MG PO TABS: 1.0000 | ORAL_TABLET | Freq: Four times a day (QID) | ORAL | 0 refills | Status: DC | PRN

## 2019-12-06 DIAGNOSIS — Z95818 Presence of other cardiac implants and grafts: Secondary | ICD-10-CM | POA: Diagnosis not present

## 2019-12-06 DIAGNOSIS — I4891 Unspecified atrial fibrillation: Secondary | ICD-10-CM | POA: Diagnosis not present

## 2019-12-07 ENCOUNTER — Telehealth: Payer: Self-pay | Admitting: *Deleted

## 2019-12-07 DIAGNOSIS — Z1211 Encounter for screening for malignant neoplasm of colon: Secondary | ICD-10-CM

## 2019-12-07 DIAGNOSIS — Z862 Personal history of diseases of the blood and blood-forming organs and certain disorders involving the immune mechanism: Secondary | ICD-10-CM

## 2019-12-07 DIAGNOSIS — M170 Bilateral primary osteoarthritis of knee: Secondary | ICD-10-CM | POA: Diagnosis not present

## 2019-12-07 NOTE — Telephone Encounter (Signed)
Thank you! Future order has been placed.

## 2019-12-07 NOTE — Telephone Encounter (Signed)
Pt returned an IFOB to our office today but I do not see any future IFOB orders in Epic. What reason are we doing the IFOB for?

## 2019-12-07 NOTE — Telephone Encounter (Signed)
Please use colorectal cancer screening and h/o anemia.

## 2019-12-08 ENCOUNTER — Other Ambulatory Visit (INDEPENDENT_AMBULATORY_CARE_PROVIDER_SITE_OTHER): Payer: PPO

## 2019-12-08 DIAGNOSIS — Z1212 Encounter for screening for malignant neoplasm of rectum: Secondary | ICD-10-CM | POA: Diagnosis not present

## 2019-12-08 DIAGNOSIS — Z1211 Encounter for screening for malignant neoplasm of colon: Secondary | ICD-10-CM | POA: Diagnosis not present

## 2019-12-08 DIAGNOSIS — Z862 Personal history of diseases of the blood and blood-forming organs and certain disorders involving the immune mechanism: Secondary | ICD-10-CM | POA: Diagnosis not present

## 2019-12-08 LAB — FECAL OCCULT BLOOD, IMMUNOCHEMICAL: Fecal Occult Bld: NEGATIVE

## 2019-12-16 IMAGING — CR DG LUMBAR SPINE COMPLETE 4+V
5 series · 5 of 5 positions shown · non-contrast
Comparison: No recent prior.

CLINICAL DATA: Chronic low back pain.

EXAM:
LUMBAR SPINE - COMPLETE 4+ VIEW

[t l-spine a.p.]
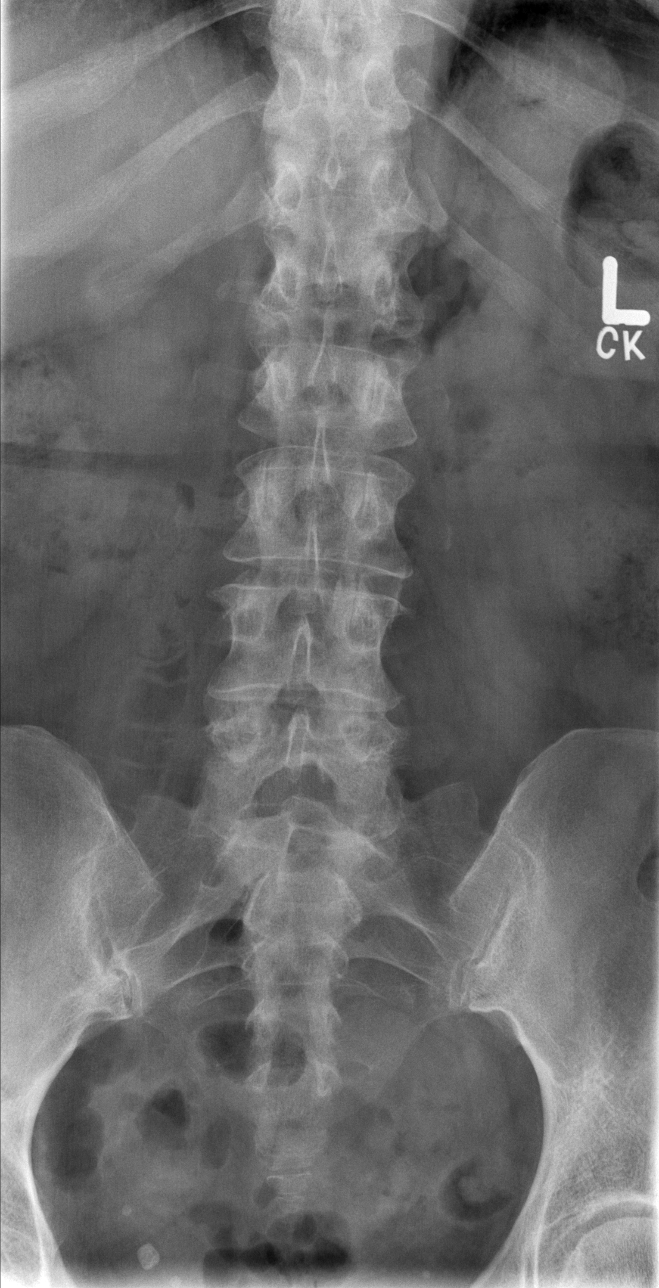

[t l-spine oblique exposure (1 of 2)]
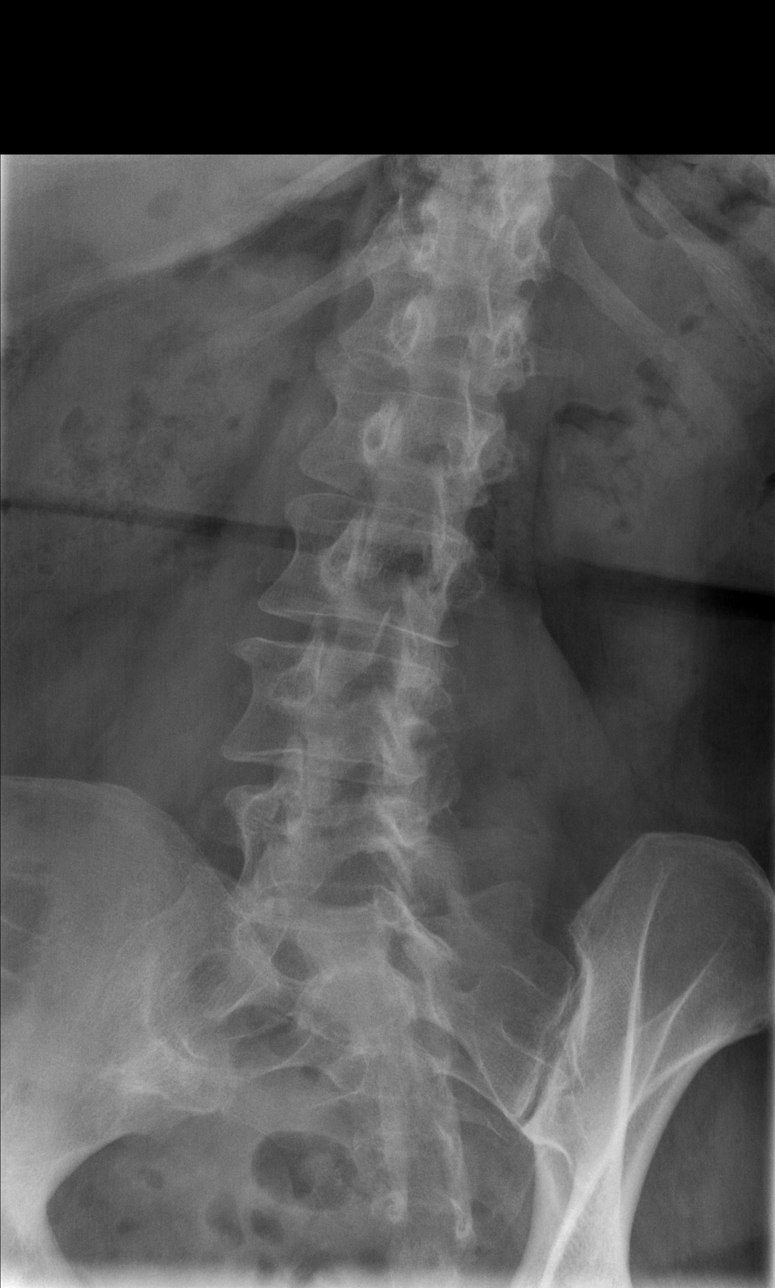

[t l-spine oblique exposure (2 of 2)]
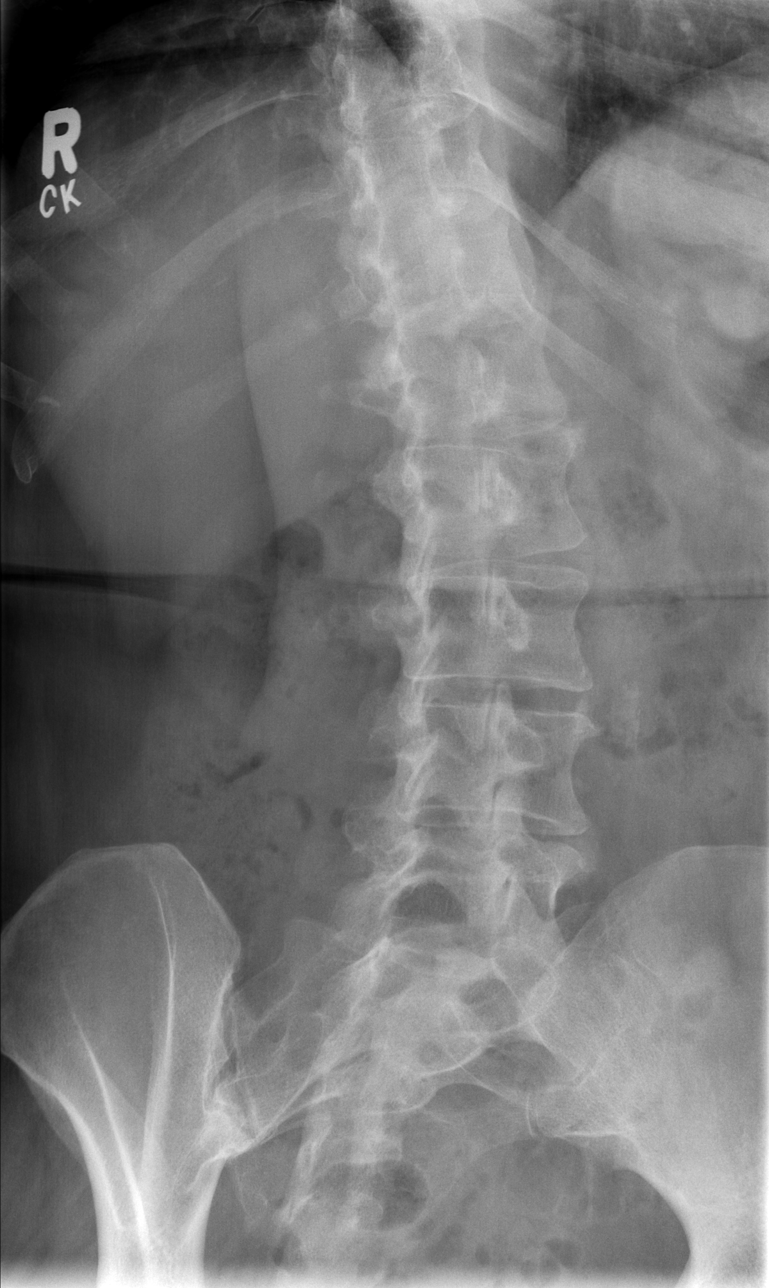

[t l-spine lat *]
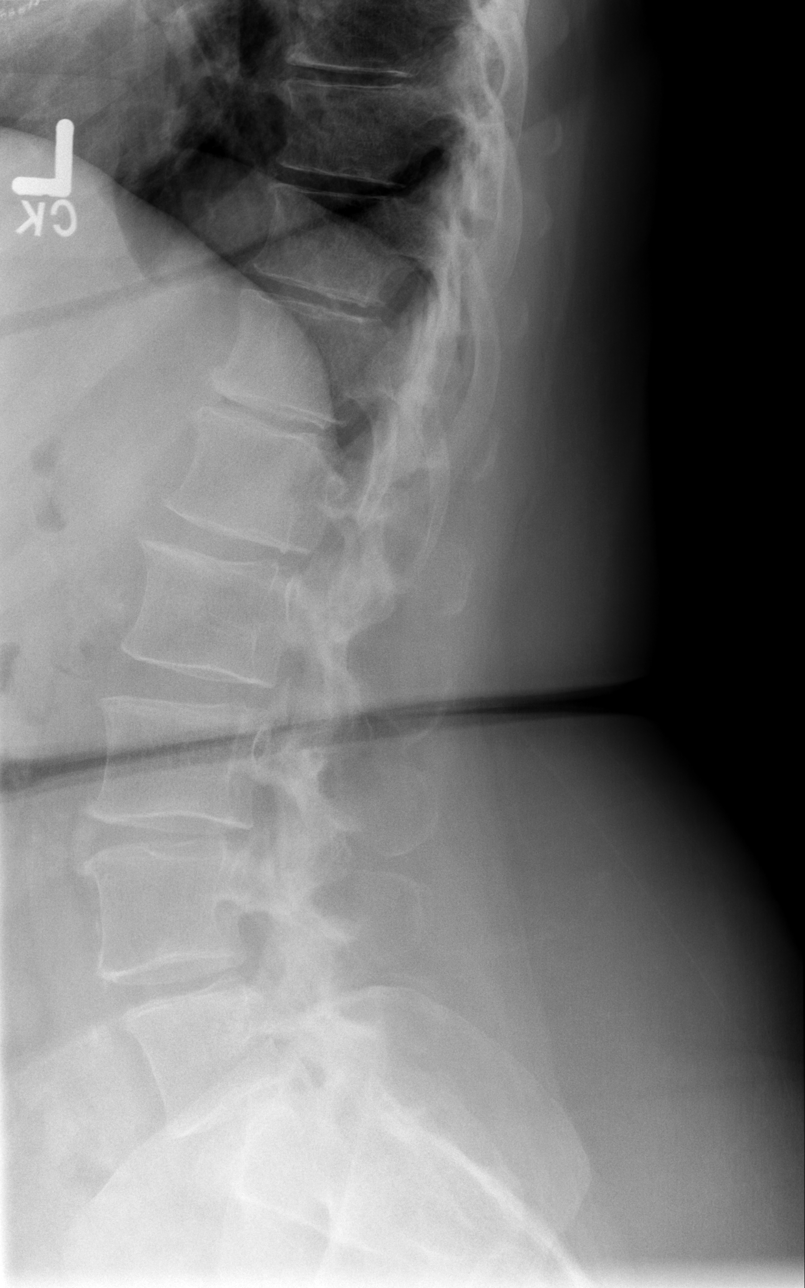

[t l-spine l5-s1 spot *]
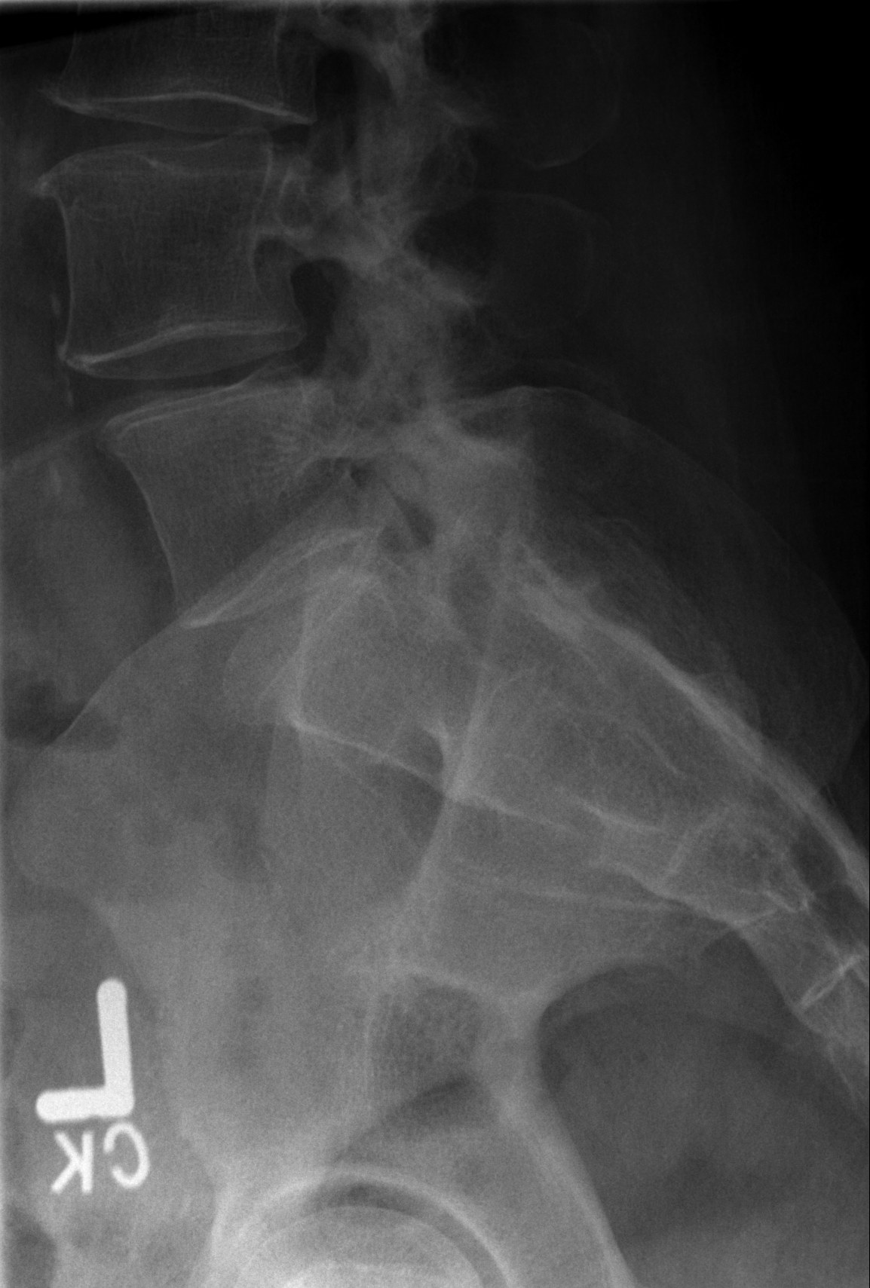

[5 of 5 positions shown; findings below may reference images not displayed]

FINDINGS: Diffuse multilevel degenerative change. No acute bony abnormality
identified. No evidence of fracture. Aortoiliac atherosclerotic
vascular calcification.
IMPRESSION: Diffuse multilevel degenerative change. No acute bony abnormality.
No evidence of fracture.

## 2019-12-23 ENCOUNTER — Ambulatory Visit: Payer: PPO | Attending: Internal Medicine

## 2019-12-23 DIAGNOSIS — Z23 Encounter for immunization: Secondary | ICD-10-CM

## 2019-12-23 NOTE — Progress Notes (Signed)
   Covid-19 Vaccination Clinic  Name:  MANAHIL CAMP    MRN: [id number] DOB: 09-20-1956  12/23/2019  Ms. Crotzer was observed post Covid-19 immunization for 15 minutes without incident. She was provided with Vaccine Information Sheet and instruction to access the V-Safe system.   Ms. Roccia was instructed to call 911 with any severe reactions post vaccine: Marland Kitchen Difficulty breathing  . Swelling of face and throat  . A fast heartbeat  . A bad rash all over body  . Dizziness and weakness   Immunizations Administered    Name Date Dose VIS Date Route   Pfizer COVID-19 Vaccine 12/23/2019 12:05 PM 0.3 mL 09/10/2019 Intramuscular   Manufacturer: [place]   Lot: [id number]   [place]: [id number]

## 2020-01-04 DIAGNOSIS — G43009 Migraine without aura, not intractable, without status migrainosus: Secondary | ICD-10-CM | POA: Diagnosis not present

## 2020-01-04 DIAGNOSIS — N3010 Interstitial cystitis (chronic) without hematuria: Secondary | ICD-10-CM | POA: Diagnosis not present

## 2020-01-04 DIAGNOSIS — M797 Fibromyalgia: Secondary | ICD-10-CM | POA: Diagnosis not present

## 2020-01-11 ENCOUNTER — Other Ambulatory Visit: Payer: Self-pay | Admitting: Family Medicine

## 2020-01-11 ENCOUNTER — Encounter: Payer: Self-pay | Admitting: Family Medicine

## 2020-01-11 MED ORDER — HYDROCODONE-ACETAMINOPHEN 5-325 MG PO TABS: 1.0000 | ORAL_TABLET | Freq: Four times a day (QID) | ORAL | 0 refills | Status: DC | PRN

## 2020-01-17 ENCOUNTER — Ambulatory Visit: Payer: Self-pay | Admitting: *Deleted

## 2020-01-17 ENCOUNTER — Ambulatory Visit: Payer: PPO | Attending: Internal Medicine

## 2020-01-17 DIAGNOSIS — Z23 Encounter for immunization: Secondary | ICD-10-CM

## 2020-01-21 DIAGNOSIS — N3010 Interstitial cystitis (chronic) without hematuria: Secondary | ICD-10-CM | POA: Diagnosis not present

## 2020-01-24 ENCOUNTER — Encounter (INDEPENDENT_AMBULATORY_CARE_PROVIDER_SITE_OTHER): Payer: Self-pay | Admitting: Family Medicine

## 2020-01-24 ENCOUNTER — Ambulatory Visit (INDEPENDENT_AMBULATORY_CARE_PROVIDER_SITE_OTHER): Payer: PPO | Admitting: Family Medicine

## 2020-01-24 ENCOUNTER — Other Ambulatory Visit: Payer: Self-pay

## 2020-01-24 VITALS — BP 135/84 | HR 72 | Temp 98.0°F | Ht 67.0 in | Wt 240.0 lb

## 2020-01-24 DIAGNOSIS — Z0289 Encounter for other administrative examinations: Secondary | ICD-10-CM

## 2020-01-24 DIAGNOSIS — R0602 Shortness of breath: Secondary | ICD-10-CM | POA: Diagnosis not present

## 2020-01-24 DIAGNOSIS — E038 Other specified hypothyroidism: Secondary | ICD-10-CM | POA: Diagnosis not present

## 2020-01-24 DIAGNOSIS — I10 Essential (primary) hypertension: Secondary | ICD-10-CM | POA: Diagnosis not present

## 2020-01-24 DIAGNOSIS — Z9189 Other specified personal risk factors, not elsewhere classified: Secondary | ICD-10-CM

## 2020-01-24 DIAGNOSIS — R5383 Other fatigue: Secondary | ICD-10-CM

## 2020-01-24 DIAGNOSIS — Z6837 Body mass index (BMI) 37.0-37.9, adult: Secondary | ICD-10-CM | POA: Diagnosis not present

## 2020-01-24 DIAGNOSIS — F418 Other specified anxiety disorders: Secondary | ICD-10-CM | POA: Diagnosis not present

## 2020-01-25 ENCOUNTER — Telehealth (INDEPENDENT_AMBULATORY_CARE_PROVIDER_SITE_OTHER): Payer: Self-pay

## 2020-01-25 LAB — COMPREHENSIVE METABOLIC PANEL
ALT: 18 IU/L (ref 0–32)
AST: 19 IU/L (ref 0–40)
Albumin/Globulin Ratio: 2.3 — ABNORMAL HIGH (ref 1.2–2.2)
Albumin: 4.4 g/dL (ref 3.8–4.8)
Alkaline Phosphatase: 79 IU/L (ref 39–117)
BUN/Creatinine Ratio: 16 (ref 12–28)
BUN: 13 mg/dL (ref 8–27)
Bilirubin Total: 0.4 mg/dL (ref 0.0–1.2)
CO2: 23 mmol/L (ref 20–29)
Calcium: 9 mg/dL (ref 8.7–10.3)
Chloride: 94 mmol/L — ABNORMAL LOW (ref 96–106)
Creatinine, Ser: 0.81 mg/dL (ref 0.57–1.00)
GFR calc Af Amer: 89 mL/min/{1.73_m2} (ref >59)
GFR calc non Af Amer: 77 mL/min/{1.73_m2} (ref >59)
Globulin, Total: 1.9 g/dL (ref 1.5–4.5)
Glucose: 86 mg/dL (ref 65–99)
Potassium: 4.4 mmol/L (ref 3.5–5.2)
Sodium: 127 mmol/L — ABNORMAL LOW (ref 134–144)
Total Protein: 6.3 g/dL (ref 6.0–8.5)

## 2020-01-25 LAB — CBC WITH DIFFERENTIAL/PLATELET
Basophils Absolute: 0.1 10*3/uL (ref 0.0–0.2)
Basos: 1 %
EOS (ABSOLUTE): 0.3 10*3/uL (ref 0.0–0.4)
Eos: 4 %
Hematocrit: 34.1 % (ref 34.0–46.6)
Hemoglobin: 10.7 g/dL — ABNORMAL LOW (ref 11.1–15.9)
Immature Grans (Abs): 0 10*3/uL (ref 0.0–0.1)
Immature Granulocytes: 0 %
Lymphocytes Absolute: 1.6 10*3/uL (ref 0.7–3.1)
Lymphs: 25 %
MCH: 24.9 pg — ABNORMAL LOW (ref 26.6–33.0)
MCHC: 31.4 g/dL — ABNORMAL LOW (ref 31.5–35.7)
MCV: 79 fL (ref 79–97)
Monocytes Absolute: 0.3 10*3/uL (ref 0.1–0.9)
Monocytes: 4 %
Neutrophils Absolute: 4.2 10*3/uL (ref 1.4–7.0)
Neutrophils: 66 %
Platelets: 274 10*3/uL (ref 150–450)
RBC: 4.3 x10E6/uL (ref 3.77–5.28)
RDW: 15.3 % (ref 11.7–15.4)
WBC: 6.3 10*3/uL (ref 3.4–10.8)

## 2020-01-25 LAB — HEMOGLOBIN A1C
Est. average glucose Bld gHb Est-mCnc: 117 mg/dL
Hgb A1c MFr Bld: 5.7 % — ABNORMAL HIGH (ref 4.8–5.6)

## 2020-01-25 LAB — TSH: TSH: 0.967 u[IU]/mL (ref 0.450–4.500)

## 2020-01-25 LAB — INSULIN, RANDOM: INSULIN: 5.9 u[IU]/mL (ref 2.6–24.9)

## 2020-01-25 NOTE — Telephone Encounter (Signed)
I notified Alexandra Patterson that she needed to see her PCP today as her labs showed low sodium levels. She informed me that she is taking Desmopressin and it can cause low sodium. Alexandra Patterson had already seen her lab results. She already had a call in to her urologist for another issue. [name] agreed to see if she could get an appointment to be seen today by her urologist or her PCP. Moshe Salisbury, CMA

## 2020-01-25 NOTE — Progress Notes (Signed)
Dear Dr. Penni Homans,   Thank you for referring Alexandra Patterson to our clinic. The following note includes my evaluation and treatment recommendations.  Chief Complaint:   OBESITY Alexandra Patterson (MR# [id number]) is a 64 y.o. female who presents for evaluation and treatment of obesity and related comorbidities. Current BMI is Body mass index is 37.59 kg/m.Alexandra Patterson has been struggling with her weight for many years and has been unsuccessful in either losing weight, maintaining weight loss, or reaching her healthy weight goal.  Alexandra Patterson is currently in the action stage of change and ready to dedicate time achieving and maintaining a healthier weight. Alexandra Patterson is interested in becoming our patient and working on intensive lifestyle modifications including (but not limited to) diet and exercise for weight loss.  Alexandra Patterson is going out to eat daily - eating out at lunch daily. Breakfast is coffee with creamer (sugar free Coffee mate). She was drinking 310 breakfast with almond milk (not hungry just eating - just eating because); Lunch is grilled chicken salad with meat and vegetables (6.5 oz of meat); Dinner is an Atkins bar or an apple.  Alexandra Patterson's habits were reviewed today and are as follows: Her family eats meals together, her desired weight loss is 75 lbs, she started gaining weight in college, her heaviest weight ever was 260 pounds, she skips dinner sometimes, she has problems with excessive hunger and she struggles with emotional eating.  Depression Screen [name]'s Food and Mood (modified PHQ-9) score was 9.  Depression screen [facility] 2/9 01/24/2020  Decreased Interest 3  Down, Depressed, Hopeless 1  PHQ - 2 Score 4  Altered sleeping 0  Tired, decreased energy 2  Change in appetite 2  Feeling bad or failure about yourself  0  Trouble concentrating 1  Moving slowly or fidgety/restless 0  Suicidal thoughts 0  PHQ-9 Score 9  Difficult doing work/chores Not difficult at all   Subjective:   Other fatigue. Alexandra Patterson  denies daytime somnolence and admits to waking up still tired. Alexandra Patterson generally gets 7-8 hours of sleep per night, and states that she has generally restful sleep. Snoring is present. Apneic episodes are not present. Epworth Sleepiness Score is 1.  Shortness of breath on exertion. Alexandra Patterson notes increasing shortness of breath with exercising and seems to be worsening over time with weight gain. She notes getting out of breath sooner with activity than she used to. This has gotten worse recently. Alexandra Patterson denies shortness of breath at rest or orthopnea.  Essential hypertension. Blood pressure is well controlled today. No chest pain, chest pressure, or headache.  BP Readings from Last 3 Encounters:  01/24/20 135/84  11/18/19 124/81  05/18/19 132/80   Lab Results  Component Value Date   CREATININE 1.05 11/23/2019   CREATININE 1.04 08/19/2019   Other specified hypothyroidism. Alexandra Patterson is on levothyroxine 137 mcg. Last thyroid panel within normal limits.  Depression with anxiety. [name] is struggling with emotional eating and using food for comfort to the extent that it is negatively impacting her health. She has been working on behavior modification techniques to help reduce her emotional eating and has been somewhat successful. She shows no sign of suicidal or homicidal ideations. [name] is on Buspar 10 mg PO TID. Symptoms are slightly controlled.  At risk for osteoporosis. [name] is at higher risk of osteopenia and osteoporosis due to Vitamin D deficiency.   Assessment/Plan:   Other fatigue. Alexandra Patterson does feel that her weight is causing her energy to be lower than  it should be. Fatigue may be related to obesity, depression or many other causes. Labs will be ordered, and in the meanwhile, Alexandra Patterson will focus on self care including making healthy food choices, increasing physical activity and focusing on stress reduction.   EKG 12-Lead, Comprehensive metabolic panel, CBC with Differential/Platelet, Hemoglobin A1c, Insulin, random  testing ordered today.  Shortness of breath on exertion. Alexandra Patterson does feel that she gets out of breath more easily that she used to when she exercises. Alexandra Patterson's shortness of breath appears to be obesity related and exercise induced. She has agreed to work on weight loss and gradually increase exercise to treat her exercise induced shortness of breath. Will continue to monitor closely.  Essential hypertension. Alexandra Patterson is working on healthy weight loss and exercise to improve blood pressure control. We will watch for signs of hypotension as she continues her lifestyle modifications. CMP ordered today.  Other specified hypothyroidism. Patient with long-standing hypothyroidism, on levothyroxine therapy. She appears euthyroid. Orders and follow up as documented in patient record. TSH ordered today.  Counseling . Good thyroid control is important for overall health. Supratherapeutic thyroid levels are dangerous and will not improve weight loss results. . The correct way to take levothyroxine is fasting, with water, separated by at least 30 minutes from breakfast, and separated by more than 4 hours from calcium, iron, multivitamins, acid reflux medications (PPIs).      Depression with anxiety. Behavior modification techniques were discussed today to help Alexandra Patterson deal with her emotional/non-hunger eating behaviors.  Orders and follow up as documented in patient record. Will follow-up at her next appointment. Alexandra Patterson wants to see Dr. Mallie Mussel face-to-face only.  At risk for osteoporosis. Alexandra Patterson was given approximately 15 minutes of osteoporosis prevention counseling today. Alexandra Patterson is at risk for osteopenia and osteoporosis due to her Vitamin D deficiency. She was encouraged to take her Vitamin D and follow her higher calcium diet and increase strengthening exercise to help strengthen her bones and decrease her risk of osteopenia and osteoporosis.  Repetitive spaced learning was employed today to elicit superior memory formation and  behavioral change.  Class 2 severe obesity with serious comorbidity and body mass index (BMI) of 37.0 to 37.9 in adult, unspecified obesity type ([place]).  Annalucia is currently in the action stage of change and her goal is to continue with weight loss efforts. I recommend Nil begin the structured treatment plan as follows:  She has agreed to keeping a food journal and adhering to recommended goals of 1300-1400 calories and 85+ grams of protein.  Exercise goals: No exercise has been prescribed at this time.   Behavioral modification strategies: increasing lean protein intake, increasing vegetables, meal planning and cooking strategies and emotional eating strategies.  She was informed of the importance of frequent follow-up visits to maximize her success with intensive lifestyle modifications for her multiple health conditions. She was informed we would discuss her lab results at her next visit unless there is a critical issue that needs to be addressed sooner. Giulia agreed to keep her next visit at the agreed upon time to discuss these results.  Objective:   Blood pressure 135/84, pulse 72, temperature 98 F (36.7 C), temperature source Oral, height 5\' 7"  (1.702 m), weight 240 lb (108.9 kg), SpO2 100 %. Body mass index is 37.59 kg/m.  EKG: Sinus  Rhythm with a rate of 78 BPM. Low voltage in precordial leads. Abnormal.   Indirect Calorimeter completed today shows a VO2 of 255 and a REE of 1773.  Her calculated basal metabolic rate is [id number] thus her basal metabolic rate is better than expected.  General: Cooperative, alert, well developed, in no acute distress. HEENT: Conjunctivae and lids unremarkable. Cardiovascular: Regular rhythm.  Lungs: Normal work of breathing. Neurologic: No focal deficits.   Lab Results  Component Value Date   CREATININE 0.81 01/24/2020   BUN 13 01/24/2020   NA 127 (L) 01/24/2020   K 4.4 01/24/2020   CL 94 (L) 01/24/2020   CO2 23 01/24/2020   Lab Results    Component Value Date   ALT 18 01/24/2020   AST 19 01/24/2020   ALKPHOS 79 01/24/2020   BILITOT 0.4 01/24/2020   Lab Results  Component Value Date   HGBA1C 5.7 (H) 01/24/2020   HGBA1C 5.5 01/08/2018   Lab Results  Component Value Date   INSULIN 5.9 01/24/2020   Lab Results  Component Value Date   TSH 0.967 01/24/2020   Lab Results  Component Value Date   CHOL 196 11/23/2019   HDL 71.50 11/23/2019   LDLCALC 107 (H) 11/23/2019   LDLDIRECT 103.0 06/03/2017   TRIG 87.0 11/23/2019   CHOLHDL 3 11/23/2019   Lab Results  Component Value Date   WBC 6.3 01/24/2020   HGB 10.7 (L) 01/24/2020   HCT 34.1 01/24/2020   MCV 79 01/24/2020   PLT 274 01/24/2020   No results found for: IRON, TIBC, FERRITIN  Attestation Statements:   Reviewed by clinician on day of visit: allergies, medications, problem list, medical history, surgical history, family history, social history, and previous encounter notes.  I, Michaelene Song, am acting as transcriptionist for Coralie Common, MD   I have reviewed the above documentation for accuracy and completeness, and I agree with the above. - Ilene Qua, MD

## 2020-01-26 ENCOUNTER — Telehealth: Payer: Self-pay

## 2020-01-26 NOTE — Telephone Encounter (Signed)
Looks like patient went to [facility] and had labs drawn on 4/26.  Sodium came back low at 127.  Would you like to set up a visit or just have her come for lab only for recheck?

## 2020-01-26 NOTE — Telephone Encounter (Signed)
Patient called in to see if Dr. [name] could put in a order in for labs for Sodium. Patient went to a weight lost visit and the labs for the patient came back reading that her sodium was very high and they wanted her to follow up with her PCP please follow up with the patient at [phone]

## 2020-01-26 NOTE — Progress Notes (Deleted)
Subjective:   Alexandra Patterson is a 64 y.o. female who presents for Medicare Annual (Subsequent) preventive examination.  Review of Systems:  Home Safety/Smoke Alarms: Feels safe in home. Smoke alarms in place.  Lives w/ husband. Son and his kids stay w/ them occasionally. 1 story home.   Female:        Mammo-     08/25/18  Dexa scan-    07/24/17    CCS- 02/11/14. 10 yr recall.    Objective:     Vitals: There were no vitals taken for this visit.  There is no height or weight on file to calculate BMI.  Advanced Directives 01/14/2019  Does Patient Have a Medical Advance Directive? No  Would patient like information on creating a medical advance directive? No - Patient declined    Tobacco Social History   Tobacco Use  Smoking Status Never Smoker  Smokeless Tobacco Never Used     Counseling given: Not Answered   Clinical Intake:                       Past Medical History:  Diagnosis Date  . Allergy   . Anxiety   . Atrial fibrillation ([place])   . Back pain   . Bilateral swelling of feet   . Bursitis   . Cervical cancer screening 09/07/2015   Menarche at 11 Irregular and heavy and painful flow secondary to fibroids No history of abnormal pap in past, last pap roughly 2003 G2P2, s/p 2 SVD history of abnormal MGM, 1 abnl bx right breast benign, normal otherwise Noconcerns today TAH b/l SPO for fibroids and migrainesand breast bx on right gyn surgeries  . Constipation   . Cough 08/13/2015  . Depression   . Depression with anxiety 06/29/2016  . Fainting    once to due heart out of rythm  . Fibromyalgia   . Frequent headaches   . GERD (gastroesophageal reflux disease)   . 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 Heartburn   . History of blood clots   . History of chicken pox   . Hx of blood clots    dvt  . Hyperlipidemia   . Hyperplastic colon polyp   . Hypertension   . Hypothyroid   . Insomnia 08/13/2015  . Internal hemorrhoids   . Interstitial cystitis   . Joint  pain   . Lesion of lung    xray in 2014 thought to be benign seen at [facility] pulmonology  . Measles    h/o  . Migraine   . Obesity   . Osteoarthritis   . Preventative health care 02/03/2017  . Raynaud's disease   . S/P AVR (aortic valve replacement) 03/19/2015  . Shoulder pain 06/03/2017  . UTI (lower urinary tract infection)   . UTI (urinary tract infection) 02/11/2016   Past Surgical History:  Procedure Laterality Date  . ABDOMINAL HYSTERECTOMY     TAH SPO  . AORTIC VALVE REPLACEMENT  2014   with [facility]  . ATRIAL ABLATION SURGERY    . BLADDER SUSPENSION    . BREAST BIOPSY Right    Needle Biopsy  . DILATION AND CURETTAGE OF UTERUS     x 3  . loop heart    . SHOULDER SURGERY Left    arthroscopy for spurs  . TONSILLECTOMY    . TUBAL LIGATION     Family History  Problem Relation Age of Onset  . Colon polyps Father   . Alzheimer's  disease Father   . Alcohol abuse Father   . Cancer Father        colon cancer  . Arthritis Mother        s/p TKR  . Neuropathy Mother   . Hyperlipidemia Mother   . Other Mother        familial mediterranean fever  . Thyroid disease Mother   . Hypertension Mother   . Obesity Mother   . Cancer Brother        prostate cancer  . Heart disease Brother        cardiomegaly  . Other Brother        familial mediterranean fever  . Asthma Maternal Grandmother   . Congestive Heart Failure Maternal Grandmother   . Heart disease Maternal Grandmother        chf  . Stroke Maternal Grandfather   . Diabetes Maternal Grandfather   . Heart disease Maternal Grandfather        hardening of the arteries  . Stroke Paternal Grandfather   . Atrial fibrillation Paternal Grandfather   . Alcohol abuse Paternal Grandfather   . Heart disease Paternal Grandfather        afib  . Interstitial cystitis Daughter   . Arthritis Son   . Alcohol abuse Son        in remission  . Mental illness Son        depression  . Other Son        interstitial cystitis  .  Colon cancer Other        parent  . Arthritis Paternal Uncle    Social History   Socioeconomic History  . Marital status: Married    Spouse name: Not on file  . Number of children: 2  . Years of education: Not on file  . Highest education level: Not on file  Occupational History  . Occupation: Best boy: [employer]  Tobacco Use  . Smoking status: Never Smoker  . Smokeless tobacco: Never Used  Substance and Sexual Activity  . Alcohol use: No  . Drug use: No  . Sexual activity: Yes    Comment: lives with husband and adult son, no major dietary restrictions, full diability  Other Topics Concern  . Not on file  Social History Narrative  . Not on file   Social Determinants of Health   Financial Resource Strain:   . Difficulty of Paying Living Expenses:   Food Insecurity:   . Worried About Charity fundraiser in the Last Year:   . Arboriculturist in the Last Year:   Transportation Needs:   . Film/video editor (Medical):   Marland Kitchen Lack of Transportation (Non-Medical):   Physical Activity:   . Days of Exercise per Week:   . Minutes of Exercise per Session:   Stress:   . Feeling of Stress :   Social Connections:   . Frequency of Communication with Friends and Family:   . Frequency of Social Gatherings with Friends and Family:   . Attends Religious Services:   . Active Member of Clubs or Organizations:   . Attends Archivist Meetings:   Marland Kitchen Marital Status:     Outpatient Encounter Medications as of 01/27/2020  Medication Sig  . amitriptyline (ELAVIL) 25 MG tablet Take 1 tablet (25 mg total) by mouth at bedtime.  Marland Kitchen atorvastatin (LIPITOR) 40 MG tablet Take 1 tablet (40 mg total) by mouth daily.  Marland Kitchen  busPIRone (BUSPAR) 10 MG tablet Take 1 tablet (10 mg total) by mouth 3 (three) times daily.  . cyclobenzaprine (FLEXERIL) 5 MG tablet Take 5 mg by mouth 3 (three) times daily as needed for muscle spasms.  Marland Kitchen desmopressin (DDAVP) 0.2 MG tablet Take  0.2 mg by mouth daily.  . DULoxetine (CYMBALTA) 60 MG capsule Take 1 capsule (60 mg total) by mouth 2 (two) times daily.  Marland Kitchen esomeprazole (NEXIUM) 40 MG capsule Take 40 mg by mouth daily at 12 noon.   Marland Kitchen HYDROcodone-acetaminophen (NORCO) 5-325 MG tablet Take 1 tablet by mouth every 6 (six) hours as needed for moderate pain.  . hydrOXYzine (ATARAX/VISTARIL) 25 MG tablet Take 1 tablet (25 mg total) by mouth at bedtime as needed.  Marland Kitchen levothyroxine (SYNTHROID) 137 MCG tablet 1 tab daily as directed except no dose on Sunday  . losartan (COZAAR) 100 MG tablet Take 1 tablet (100 mg total) by mouth daily.  . Magnesium 400 MG CAPS Take 400 mg by mouth daily. Reported on 02/13/2016  . meloxicam (MOBIC) 15 MG tablet TAKE 1 TABLET BY MOUTH EVERY DAY AS NEEDED FOR PAIN  . Methen-Hyosc-Meth Blue-Na Phos (UROGESIC-BLUE) 81.6 MG TABS Take 1 tablet (81.6 mg total) by mouth 4 (four) times daily as needed.  . metoprolol succinate (TOPROL-XL) 50 MG 24 hr tablet Take 2 tablets (100 mg total) by mouth daily. Take with or immediately following a meal.  . Metoprolol Succinate 100 MG CS24 Take 100 mg by mouth.  . Metoprolol Succinate 50 MG CS24 Take 50 mg by mouth.  . Misc Natural Products (FIBER 7 PO) Take by mouth.  . ondansetron (ZOFRAN) 4 MG tablet Take 4 mg by mouth every 8 (eight) hours as needed for nausea or vomiting.  . rizatriptan (MAXALT) 10 MG tablet Take 10 mg by mouth as needed for migraine. May repeat in 2 hours if needed  . topiramate (TOPAMAX) 100 MG tablet Take 1 tablet (100 mg total) by mouth 2 (two) times daily.  . Turmeric Curcumin 500 MG CAPS    No facility-administered encounter medications on file as of 01/27/2020.    Activities of Daily Living No flowsheet data found.  Patient Care Team: [name], MD as PCP - General (Family Medicine) Veneda Melter, MD as Referring Physician (Cardiology) Dudley Major, MD as Referring Physician (Internal Medicine) Almyra Deforest, MD as  Referring Physician (Cardiothoracic Surgery) Kerin Perna., MD as Referring Physician (Neurology)    Assessment:   This is a routine wellness examination for Emilyanne. Physical assessment deferred to PCP.  Exercise Activities and Dietary recommendations   Diet (meal preparation, eat out, water intake, caffeinated beverages, dairy products, fruits and vegetables): {Desc; diets:16563} Breakfast: Lunch:  Dinner:      Goals    . DIET - INCREASE WATER INTAKE    . Exercise 150 min/wk Moderate Activity       Fall Risk Fall Risk  01/14/2019 06/20/2016 01/18/2016  Falls in the past year? 0 No No    Depression Screen PHQ 2/9 Scores 01/24/2020 01/14/2019 06/20/2016 01/18/2016  PHQ - 2 Score 4 2 4  0  PHQ- 9 Score 9 7 11  -  Exception Documentation - - - Medical reason     Cognitive Function Ad8 score reviewed for issues:  Issues making decisions:  Less interest in hobbies / activities:  Repeats questions, stories (family complaining):  Trouble using ordinary gadgets (microwave, computer, phone):  Forgets the month or year:   Mismanaging finances:   Remembering  appts:  Daily problems with thinking and/or memory: Ad8 score is=         Immunization History  Administered Date(s) Administered  . Influenza,inj,Quad PF,6+ Mos 07/29/2019  . PFIZER SARS-COV-2 Vaccination 12/23/2019, 01/17/2020  . Tdap 05/26/2015  . Zoster 05/26/2015  . Zoster Recombinat (Shingrix) 05/29/2018, 08/01/2018   Screening Tests Health Maintenance  Topic Date Due  . Hepatitis C Screening  Never done  . HIV Screening  Never done  . INFLUENZA VACCINE  04/30/2020  . MAMMOGRAM  08/24/2020  . PAP SMEAR-Modifier  05/17/2022  . COLONOSCOPY  02/12/2024  . TETANUS/TDAP  05/25/2025  . COVID-19 Vaccine  Completed      Plan:   ***   I have personally reviewed and noted the following in the patient's chart:   . Medical and social history . Use of alcohol, tobacco or illicit drugs  . Current medications  and supplements . Functional ability and status . Nutritional status . Physical activity . Advanced directives . List of other physicians . Hospitalizations, surgeries, and ER visits in previous 12 months . Vitals . Screenings to include cognitive, depression, and falls . Referrals and appointments  In addition, I have reviewed and discussed with patient certain preventive protocols, quality metrics, and best practice recommendations. A written personalized care plan for preventive services as well as general preventive health recommendations were provided to patient.     Naaman Plummer [place], [place]  01/26/2020

## 2020-01-26 NOTE — Telephone Encounter (Signed)
So best approach is to repeat a cmp and make sure she has cut her water intake down by one to two beverages a day and add a urinary sodium (random) and urine osmolality then we can next steps and she will watch for headaches, dizziness and report these

## 2020-01-27 ENCOUNTER — Other Ambulatory Visit: Payer: Self-pay

## 2020-01-27 ENCOUNTER — Ambulatory Visit (INDEPENDENT_AMBULATORY_CARE_PROVIDER_SITE_OTHER): Payer: PPO | Admitting: Family Medicine

## 2020-01-27 ENCOUNTER — Ambulatory Visit: Payer: PPO | Admitting: *Deleted

## 2020-01-27 ENCOUNTER — Encounter: Payer: Self-pay | Admitting: Family Medicine

## 2020-01-27 VITALS — BP 128/84 | HR 74 | Temp 97.5°F | Resp 17 | Ht 67.0 in | Wt 234.0 lb

## 2020-01-27 DIAGNOSIS — N390 Urinary tract infection, site not specified: Secondary | ICD-10-CM

## 2020-01-27 DIAGNOSIS — E871 Hypo-osmolality and hyponatremia: Secondary | ICD-10-CM | POA: Diagnosis not present

## 2020-01-27 LAB — BASIC METABOLIC PANEL
BUN: 20 mg/dL (ref 6–23)
CO2: 26 mEq/L (ref 19–32)
Calcium: 9.1 mg/dL (ref 8.4–10.5)
Chloride: 100 mEq/L (ref 96–112)
Creatinine, Ser: 1 mg/dL (ref 0.40–1.20)
GFR: 55.69 mL/min — ABNORMAL LOW (ref >60.00)
Glucose, Bld: 96 mg/dL (ref 70–99)
Potassium: 4.7 mEq/L (ref 3.5–5.1)
Sodium: 133 mEq/L — ABNORMAL LOW (ref 135–145)

## 2020-01-27 LAB — CBC
HCT: 34 % — ABNORMAL LOW (ref 36.0–46.0)
Hemoglobin: 10.8 g/dL — ABNORMAL LOW (ref 12.0–15.0)
MCHC: 31.8 g/dL (ref 30.0–36.0)
MCV: 77.6 fl — ABNORMAL LOW (ref 78.0–100.0)
Platelets: 295 10*3/uL (ref 150.0–400.0)
RBC: 4.38 Mil/uL (ref 3.87–5.11)
RDW: 17.5 % — ABNORMAL HIGH (ref 11.5–15.5)
WBC: 6 10*3/uL (ref 4.0–10.5)

## 2020-01-27 NOTE — Progress Notes (Addendum)
[place] [facility] [address] [place], [place] [address] [phone]  Date:  01/27/2020   Name:  Alexandra Patterson   DOB:  05-04-1956   MRN:  [id number]  PCP:  [name], MD    Chief Complaint: Abdominal Pain (uti for three weeks, came back, took monurol) and Fatigue (shakiness in mornings, sodium level low)   History of Present Illness:  Alexandra Patterson is a 64 y.o. very pleasant female patient who presents with the following:  Primary patient of my partner Dr. [name], I have seen this pt once in 2017 Here today with concern of possible persistent UTI and hyponatremia  She sees urology with [facility].  There are several recent phone messages on the chart It looks like she was treated with keflex for UTI a few weeks ago and then received monurol yesterday She also contacted her urologist with concern of hyponatremia, they recently added desmopressin to her regimen and advised her to stop taking it   Her sodium was 127 on 4/26  She notes that she "is dry as all get out" so she has a hard time cutting down on liquids "right now my mouth is bone dry"  I don't see a recent urine culture for her- she states that she gave a sample this past Friday 4/23 but it has not come back yet per her knowledge  Today she notes that "my abdomen aches like I was on my period. I don't have any energy, my back hurts.  The usual stuff you have with a UTI.  I guess because I've had a UTI for 3 weeks" Backache and abdominal discomfort are not new, patient has noted for at least 1 to 2 weeks  No vomiting or fever  She is eating normally   She has noted allergy type symptoms for 2-3 weeks with stuffy nose and drainage She is using zyrtec for allergies- this helps her some  She has not had good luck with Allegra    Patient Active Problem List   Diagnosis Date Noted  . Vitamin D deficiency 11/18/2019  . Headache 07/25/2019  . Educated about COVID-19  virus infection 05/23/2019  . Chronic low back pain with left-sided sciatica 05/17/2018  . Urinary frequency 05/17/2018  . Fatigue 05/14/2018  . Interstitial cystitis 05/14/2018  . 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 Shoulder pain 06/03/2017  . Preventative health care 02/03/2017  . Depression with anxiety 06/29/2016  . Thyroiditis, subacute 06/04/2016  . UTI (urinary tract infection) 02/11/2016  . Cervical cancer screening 09/07/2015  . Cough 08/13/2015  . Insomnia 08/13/2015  . Acute stress reaction 04/25/2015  . Fibromyalgia   . Lesion of lung   . Allergy   . GERD (gastroesophageal reflux disease)   . Hyperlipidemia   . Hypertension   . Migraine   . Hyperplastic colon polyp   . Atrial fibrillation ([place])   . Hx of blood clots   . Obesity   . History of chicken pox     Past Medical History:  Diagnosis Date  . Allergy   . Anxiety   . Atrial fibrillation ([place])   . Back pain   . Bilateral swelling of feet   . Bursitis   . Cervical cancer screening 09/07/2015   Menarche at 11 Irregular and heavy and painful flow secondary to fibroids No history of abnormal pap in past, last pap roughly 2003 G2P2, s/p 2 SVD history of  abnormal MGM, 1 abnl bx right breast benign, normal otherwise Noconcerns today TAH b/l SPO for fibroids and migrainesand breast bx on right gyn surgeries  . Constipation   . Cough 08/13/2015  . Depression   . Depression with anxiety 06/29/2016  . Fainting    once to due heart out of rythm  . Fibromyalgia   . Frequent headaches   . GERD (gastroesophageal reflux disease)   . 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 Heartburn   . History of blood clots   . History of chicken pox   . Hx of blood clots    dvt  . Hyperlipidemia   . Hyperplastic colon polyp   . Hypertension   . Hypothyroid   . Insomnia 08/13/2015  . Internal hemorrhoids   . Interstitial cystitis   . Joint pain   . Lesion of lung    xray in 2014 thought to be benign seen at [facility]ology   . Measles    h/o  . Migraine   . Obesity   . Osteoarthritis   . Preventative health care 02/03/2017  . Raynaud's disease   . S/P AVR (aortic valve replacement) 03/19/2015  . Shoulder pain 06/03/2017  . UTI (lower urinary tract infection)   . UTI (urinary tract infection) 02/11/2016    Past Surgical History:  Procedure Laterality Date  . ABDOMINAL HYSTERECTOMY     TAH SPO  . AORTIC VALVE REPLACEMENT  2014   with [facility]  . ATRIAL ABLATION SURGERY    . BLADDER SUSPENSION    . BREAST BIOPSY Right    Needle Biopsy  . DILATION AND CURETTAGE OF UTERUS     x 3  . loop heart    . SHOULDER SURGERY Left    arthroscopy for spurs  . TONSILLECTOMY    . TUBAL LIGATION      Social History   Tobacco Use  . Smoking status: Never Smoker  . Smokeless tobacco: Never Used  Substance Use Topics  . Alcohol use: No  . Drug use: No    Family History  Problem Relation Age of Onset  . Colon polyps Father   . Alzheimer's disease Father   . Alcohol abuse Father   . Cancer Father        colon cancer  . Arthritis Mother        s/p TKR  . Neuropathy Mother   . Hyperlipidemia Mother   . Other Mother        familial mediterranean fever  . Thyroid disease Mother   . Hypertension Mother   . Obesity Mother   . Cancer Brother        prostate cancer  . Heart disease Brother        cardiomegaly  . Other Brother        familial mediterranean fever  . Asthma Maternal Grandmother   . Congestive Heart Failure Maternal Grandmother   . Heart disease Maternal Grandmother        chf  . Stroke Maternal Grandfather   . Diabetes Maternal Grandfather   . Heart disease Maternal Grandfather        hardening of the arteries  . Stroke Paternal Grandfather   . Atrial fibrillation Paternal Grandfather   . Alcohol abuse Paternal Grandfather   . Heart disease Paternal Grandfather        afib  . Interstitial cystitis Daughter   . Arthritis Son   . Alcohol abuse Son  in remission  . Mental illness  Son        depression  . Other Son        interstitial cystitis  . Colon cancer Other        parent  . Arthritis Paternal Uncle     Allergies  Allergen Reactions  . Ciprofloxacin Rash and Shortness Of Breath  . Sulfa Antibiotics Hives  . Sulfasalazine Hives  . Cortisone Other (See Comments)    Red flush and anxiety  . Tramadol Other (See Comments)    headache  . Zonisamide Other (See Comments)    unk  . Erythromycin Rash  . Erythromycin Base Rash  . Levofloxacin Nausea Only  . Lisinopril     Other reaction(s): Cough (ALLERGY/intolerance)  . Prednisone Rash and Anxiety    Medication list has been reviewed and updated.  Current Outpatient Medications on File Prior to Visit  Medication Sig Dispense Refill  . amitriptyline (ELAVIL) 25 MG tablet Take 1 tablet (25 mg total) by mouth at bedtime.    Marland Kitchen atorvastatin (LIPITOR) 40 MG tablet Take 1 tablet (40 mg total) by mouth daily. 90 tablet 3  . busPIRone (BUSPAR) 10 MG tablet Take 1 tablet (10 mg total) by mouth 3 (three) times daily. 270 tablet 1  . cyclobenzaprine (FLEXERIL) 5 MG tablet Take 5 mg by mouth 3 (three) times daily as needed for muscle spasms.    . DULoxetine (CYMBALTA) 60 MG capsule Take 1 capsule (60 mg total) by mouth 2 (two) times daily. 180 capsule 1  . esomeprazole (NEXIUM) 40 MG capsule Take 40 mg by mouth daily at 12 noon.     Marland Kitchen HYDROcodone-acetaminophen (NORCO) 5-325 MG tablet Take 1 tablet by mouth every 6 (six) hours as needed for moderate pain. 60 tablet 0  . hydrOXYzine (ATARAX/VISTARIL) 25 MG tablet Take 1 tablet (25 mg total) by mouth at bedtime as needed. 90 tablet 1  . levothyroxine (SYNTHROID) 137 MCG tablet 1 tab daily as directed except no dose on Sunday 90 tablet 1  . losartan (COZAAR) 100 MG tablet Take 1 tablet (100 mg total) by mouth daily. 90 tablet 1  . Magnesium 400 MG CAPS Take 400 mg by mouth daily. Reported on 02/13/2016    . meloxicam (MOBIC) 15 MG tablet TAKE 1 TABLET BY MOUTH EVERY DAY  AS NEEDED FOR PAIN 90 tablet 1  . Methen-Hyosc-Meth Blue-Na Phos (UROGESIC-BLUE) 81.6 MG TABS Take 1 tablet (81.6 mg total) by mouth 4 (four) times daily as needed.    . Metoprolol Succinate 100 MG CS24 Take 100 mg by mouth.    . Metoprolol Succinate 50 MG CS24 Take 50 mg by mouth.    . Misc Natural Products (FIBER 7 PO) Take by mouth.    . ondansetron (ZOFRAN) 4 MG tablet Take 4 mg by mouth every 8 (eight) hours as needed for nausea or vomiting.    . rizatriptan (MAXALT) 10 MG tablet Take 10 mg by mouth as needed for migraine. May repeat in 2 hours if needed    . topiramate (TOPAMAX) 100 MG tablet Take 1 tablet (100 mg total) by mouth 2 (two) times daily.     No current facility-administered medications on file prior to visit.    Review of Systems:  As per HPI- otherwise negative.   Physical Examination: Vitals:   01/27/20 1405  BP: 128/84  Pulse: 74  Resp: 17  Temp: (!) 97.5 F (36.4 C)  SpO2: 100%   Vitals:  01/27/20 1405  Weight: 234 lb (106.1 kg)  Height: 5\' 7"  (1.702 m)   Body mass index is 36.65 kg/m. Ideal Body Weight: Weight in (lb) to have BMI = 25: 159.3  GEN: no acute distress.Obese, otherwise looks well HEENT: Atraumatic, Normocephalic.  Ears and Nose: No external deformity. CV: RRR, No M/G/R. No JVD. No thrill. No extra heart sounds. PULM: CTA B, no wheezes, crackles, rhonchi. No retractions. No resp. distress. No accessory muscle use. ABD: S, NT, ND, +BS. No rebound. No HSM.  Abdomen is benign to exam, no CVA tenderness EXTR: No c/c/e PSYCH: Normally interactive. Conversant.    Assessment and Plan: Hyponatremia - Plan: Basic metabolic panel  Frequent UTI - Plan: Urine Culture, CBC  Here today to follow-up on hyponatremia and recent UTI we will recheck her sodium today, hopefully this has improved since she stopped desmopressin  She recently took a course of Monurol for UTI.  She did apparently give a urine sample for culture to her Urologist 5 days  ago, but result is not back yet.  We are not sure if something may have gone wrong.  I will go ahead and reculture today We'll also check a CBC- Will plan further follow- up pending labs. Encouraged her to try adding a nasal steroid spray to her regimen for seasonal use Moderate medical decision making today  This visit occurred during the SARS-CoV-2 public health emergency.  Safety protocols were in place, including screening questions prior to the visit, additional usage of staff PPE, and extensive cleaning of exam room while observing appropriate contact time as indicated for disinfecting solutions.    Signed Lamar Blinks, MD  Received her blood work as below, message to patient  Results for orders placed or performed in visit on 01/27/20  Urine Culture   Specimen: Blood  Result Value Ref Range   MICRO NUMBER: [id number]    SPECIMEN QUALITY: Adequate    Sample Source NOT GIVEN    STATUS: FINAL    Result: No Growth   Basic metabolic panel  Result Value Ref Range   Sodium 133 (L) 135 - 145 mEq/L   Potassium 4.7 3.5 - 5.1 mEq/L   Chloride 100 96 - 112 mEq/L   CO2 26 19 - 32 mEq/L   Glucose, Bld 96 70 - 99 mg/dL   BUN 20 6 - 23 mg/dL   Creatinine, Ser 1.00 0.40 - 1.20 mg/dL   GFR 55.69 (L) >60.00 mL/min   Calcium 9.1 8.4 - 10.5 mg/dL  CBC  Result Value Ref Range   WBC 6.0 4.0 - 10.5 K/uL   RBC 4.38 3.87 - 5.11 Mil/uL   Platelets 295.0 150.0 - 400.0 K/uL   Hemoglobin 10.8 (L) 12.0 - 15.0 g/dL   HCT 34.0 (L) 36.0 - 46.0 %   MCV 77.6 (L) 78.0 - 100.0 fl   MCHC 31.8 30.0 - 36.0 g/dL   RDW 17.5 (H) 11.5 - 15.5 %   Hyponatremia is improved She has had anemia since February of this year Colonoscopy is up-to-date This may be iron deficiency  Received urine culture 5/1- message to pt

## 2020-01-27 NOTE — Telephone Encounter (Signed)
Patient seen [facility]land in office today.

## 2020-01-27 NOTE — Patient Instructions (Signed)
Good to see you today- I will be in touch with your labs and urine culture asap  Please let me know if you start feeling worse in the meantime  Please try a nasal steroid spray such as flonase for your allergy symptoms

## 2020-01-28 LAB — URINE CULTURE
MICRO NUMBER:: 10420944
Result:: NO GROWTH
SPECIMEN QUALITY:: ADEQUATE

## 2020-01-29 ENCOUNTER — Encounter: Payer: Self-pay | Admitting: Family Medicine

## 2020-02-01 DIAGNOSIS — N3941 Urge incontinence: Secondary | ICD-10-CM | POA: Diagnosis not present

## 2020-02-01 DIAGNOSIS — N3010 Interstitial cystitis (chronic) without hematuria: Secondary | ICD-10-CM | POA: Diagnosis not present

## 2020-02-03 DIAGNOSIS — N3941 Urge incontinence: Secondary | ICD-10-CM | POA: Diagnosis not present

## 2020-02-07 ENCOUNTER — Encounter (INDEPENDENT_AMBULATORY_CARE_PROVIDER_SITE_OTHER): Payer: Self-pay | Admitting: Family Medicine

## 2020-02-07 ENCOUNTER — Ambulatory Visit (INDEPENDENT_AMBULATORY_CARE_PROVIDER_SITE_OTHER): Payer: PPO | Admitting: Family Medicine

## 2020-02-07 ENCOUNTER — Other Ambulatory Visit: Payer: Self-pay

## 2020-02-07 VITALS — BP 115/80 | HR 76 | Temp 98.1°F | Ht 67.0 in | Wt 234.0 lb

## 2020-02-07 DIAGNOSIS — E559 Vitamin D deficiency, unspecified: Secondary | ICD-10-CM

## 2020-02-07 DIAGNOSIS — D509 Iron deficiency anemia, unspecified: Secondary | ICD-10-CM

## 2020-02-07 DIAGNOSIS — R7303 Prediabetes: Secondary | ICD-10-CM | POA: Diagnosis not present

## 2020-02-07 DIAGNOSIS — Z6836 Body mass index (BMI) 36.0-36.9, adult: Secondary | ICD-10-CM

## 2020-02-08 NOTE — Progress Notes (Signed)
Chief Complaint:   OBESITY Alexandra Patterson is here to discuss her progress with her obesity treatment plan along with follow-up of her obesity related diagnoses. Alexandra Patterson is on keeping a food journal and adhering to recommended goals of 1300-1400 calories and 85 grams of protein and states she is following her eating plan approximately 100% of the time. Alexandra Patterson states she is exercising for 0 minutes 0 times per week.  Today's visit was #: 2 Starting weight: 240 lbs Starting date: 01/24/2020 Today's weight: 234 lbs Today's date: 02/07/2020 Total lbs lost to date: 6 lbs Total lbs lost since last in-office visit: 6 lbs  Interim History: Alexandra Patterson has been sick for the majority of the last 2 weeks.  She has had a chronic UTI and has been on and off antibiotics and had Botox injections for incontinence.  She endorses hunger at night.  She felt free after her last visit in terms of the ability to eat whatever she would like.  She ate more fruit on days she was hungry.  She has not met her protein goal of 85 grams on any day.  Subjective:   1. Prediabetes Alexandra Patterson has a diagnosis of prediabetes based on her elevated HgA1c and was informed this puts her at greater risk of developing diabetes. She continues to work on diet and exercise to decrease her risk of diabetes. She denies nausea or hypoglycemia.  This is a new diagnosis.  Lab Results  Component Value Date   HGBA1C 5.7 (H) 01/24/2020   Lab Results  Component Value Date   INSULIN 5.9 01/24/2020   2. Microcytic anemia MCV 79, H&H 10.7, 34.1.  She was not on iron previously but she has started an iron supplement.  No anemia panel in Epic.  3. Vitamin D deficiency Alexandra Patterson's Vitamin D level was 61.43 on 11/23/2019. She is currently taking OTC vitamin D. She denies nausea, vomiting or muscle weakness.  She endorses fatigue.  Assessment/Plan:   1. Prediabetes Alexandra Patterson will continue to work on weight loss, exercise, and decreasing simple carbohydrates to help decrease the  risk of diabetes.   2. Microcytic anemia Follow-up CBC in 3 months with anemia panel.  3. Vitamin D deficiency Continue OTC vitamin D.  4. Class 2 severe obesity with serious comorbidity and body mass index (BMI) of 36.0 to 36.9 in adult, unspecified obesity type Alexandra Patterson) Alexandra Patterson is currently in the action stage of change. As such, her goal is to continue with weight loss efforts. She has agreed to keeping a food journal and adhering to recommended goals of 1300-1400 calories and 85+ grams of protein.   Exercise goals: No exercise has been prescribed at this time.  Behavioral modification strategies: increasing lean protein intake, increasing vegetables, meal planning and cooking strategies, keeping healthy foods in the home and keeping a strict food journal.  Alexandra Patterson has agreed to follow-up with our clinic in 2 weeks. She was informed of the importance of frequent follow-up visits to maximize her success with intensive lifestyle modifications for her multiple health conditions.   Objective:   Blood pressure 115/80, pulse 76, temperature 98.1 F (36.7 C), temperature source Oral, height _0  (1.702 m), weight 234 lb (106.1 kg), SpO2 99 %. Body mass index is 36.65 kg/m.  General: Cooperative, alert, well developed, in no acute distress. HEENT: Conjunctivae and lids unremarkable. Cardiovascular: Regular rhythm.  Lungs: Normal work of breathing. Neurologic: No focal deficits.   Lab Results  Component Value Date   CREATININE 1.00 01/27/2020  BUN 20 01/27/2020   NA 133 (L) 01/27/2020   K 4.7 01/27/2020   CL 100 01/27/2020   CO2 26 01/27/2020   Lab Results  Component Value Date   ALT 18 01/24/2020   AST 19 01/24/2020   ALKPHOS 79 01/24/2020   BILITOT 0.4 01/24/2020   Lab Results  Component Value Date   HGBA1C 5.7 (H) 01/24/2020   HGBA1C 5.5 01/08/2018   Lab Results  Component Value Date   INSULIN 5.9 01/24/2020   Lab Results  Component Value Date   TSH 0.967 01/24/2020    Lab Results  Component Value Date   CHOL 196 11/23/2019   HDL 71.50 11/23/2019   LDLCALC 107 (H) 11/23/2019   LDLDIRECT 103.0 06/03/2017   TRIG 87.0 11/23/2019   CHOLHDL 3 11/23/2019   Lab Results  Component Value Date   WBC 6.0 01/27/2020   HGB 10.8 (L) 01/27/2020   HCT 34.0 (L) 01/27/2020   MCV 77.6 (L) 01/27/2020   PLT 295.0 01/27/2020   Attestation Statements:   Reviewed by clinician on day of visit: allergies, medications, problem list, medical history, surgical history, family history, social history, and previous encounter notes.  Time spent on visit including pre-visit chart review and post-visit care and charting was 32 minutes.   I, Water quality scientist, CMA, am acting as transcriptionist for Coralie Common, MD.  I have reviewed the above documentation for accuracy and completeness, and I agree with the above. - Jinny Blossom, MD

## 2020-02-17 ENCOUNTER — Ambulatory Visit (INDEPENDENT_AMBULATORY_CARE_PROVIDER_SITE_OTHER): Payer: PPO | Admitting: Family Medicine

## 2020-02-17 ENCOUNTER — Other Ambulatory Visit: Payer: Self-pay

## 2020-02-17 VITALS — BP 142/80 | HR 76 | Temp 97.9°F | Resp 12 | Ht 67.0 in | Wt 233.0 lb

## 2020-02-17 DIAGNOSIS — M797 Fibromyalgia: Secondary | ICD-10-CM | POA: Diagnosis not present

## 2020-02-17 DIAGNOSIS — E785 Hyperlipidemia, unspecified: Secondary | ICD-10-CM

## 2020-02-17 DIAGNOSIS — N3010 Interstitial cystitis (chronic) without hematuria: Secondary | ICD-10-CM

## 2020-02-17 DIAGNOSIS — I10 Essential (primary) hypertension: Secondary | ICD-10-CM

## 2020-02-17 DIAGNOSIS — E66812 Morbid (severe) obesity due to excess calories: Secondary | ICD-10-CM

## 2020-02-17 DIAGNOSIS — G8929 Other chronic pain: Secondary | ICD-10-CM

## 2020-02-17 DIAGNOSIS — E559 Vitamin D deficiency, unspecified: Secondary | ICD-10-CM

## 2020-02-17 DIAGNOSIS — Z6836 Body mass index (BMI) 36.0-36.9, adult: Secondary | ICD-10-CM | POA: Diagnosis not present

## 2020-02-17 DIAGNOSIS — N3000 Acute cystitis without hematuria: Secondary | ICD-10-CM

## 2020-02-17 DIAGNOSIS — Z79899 Other long term (current) drug therapy: Secondary | ICD-10-CM

## 2020-02-17 DIAGNOSIS — M5442 Lumbago with sciatica, left side: Secondary | ICD-10-CM | POA: Diagnosis not present

## 2020-02-17 NOTE — Patient Instructions (Signed)

## 2020-02-20 LAB — DRUG MONITORING, PANEL 8 WITH CONFIRMATION, URINE
6 Acetylmorphine: NEGATIVE ng/mL (ref <10)
Alcohol Metabolites: NEGATIVE ng/mL
Amphetamines: NEGATIVE ng/mL (ref <500)
Benzodiazepines: NEGATIVE ng/mL (ref <100)
Buprenorphine, Urine: NEGATIVE ng/mL (ref <5)
Cocaine Metabolite: NEGATIVE ng/mL (ref <150)
Codeine: NEGATIVE ng/mL (ref <50)
Creatinine: 45.1 mg/dL
Hydrocodone: 54 ng/mL — ABNORMAL HIGH (ref <50)
Hydromorphone: NEGATIVE ng/mL (ref <50)
MDMA: NEGATIVE ng/mL (ref <500)
Marijuana Metabolite: NEGATIVE ng/mL (ref <20)
Morphine: NEGATIVE ng/mL (ref <50)
Norhydrocodone: 333 ng/mL — ABNORMAL HIGH (ref <50)
Opiates: POSITIVE ng/mL — AB (ref <100)
Oxidant: NEGATIVE ug/mL
Oxycodone: NEGATIVE ng/mL (ref <100)
pH: 7.4 (ref 4.5–9.0)

## 2020-02-20 LAB — DM TEMPLATE

## 2020-02-20 NOTE — Progress Notes (Signed)
Subjective:    Patient ID: Alexandra Patterson, female    DOB: 1956-08-05, 64 y.o.   MRN: [id number]  Chief Complaint  Patient presents with  . Follow-up    HPI Patient is in today for follow up on chronic medical concerns. She continues to struggle with fatigue and pain. She is working with healthy weight and wellness on her weight. No recent febrile illness or hospitalizations. She has been struggling with recurrent urinary tract infections and interstitial cystitis and is following with urology Dr Amalia Hailey. Denies CP/palp/SOB/HA/congestion/fevers/GI c/o. Taking meds as prescribed  Past Medical History:  Diagnosis Date  . Allergy   . Anxiety   . Atrial fibrillation ([place])   . Back pain   . Bilateral swelling of feet   . Bursitis   . Cervical cancer screening 09/07/2015   Menarche at 11 Irregular and heavy and painful flow secondary to fibroids No history of abnormal pap in past, last pap roughly 2003 G2P2, s/p 2 SVD history of abnormal MGM, 1 abnl bx right breast benign, normal otherwise Noconcerns today TAH b/l SPO for fibroids and migrainesand breast bx on right gyn surgeries  . Constipation   . Cough 08/13/2015  . Depression   . Depression with anxiety 06/29/2016  . Fainting    once to due heart out of rythm  . Fibromyalgia   . Frequent headaches   . GERD (gastroesophageal reflux disease)   . 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 Heartburn   . History of blood clots   . History of chicken pox   . Hx of blood clots    dvt  . Hyperlipidemia   . Hyperplastic colon polyp   . Hypertension   . Hypothyroid   . Insomnia 08/13/2015  . Internal hemorrhoids   . Interstitial cystitis   . Joint pain   . Lesion of lung    xray in 2014 thought to be benign seen at [facility] pulmonology  . Measles    h/o  . Migraine   . Obesity   . Osteoarthritis   . Preventative health care 02/03/2017  . Raynaud's disease   . S/P AVR (aortic valve replacement) 03/19/2015  . Shoulder pain 06/03/2017  . UTI  (lower urinary tract infection)   . UTI (urinary tract infection) 02/11/2016    Past Surgical History:  Procedure Laterality Date  . ABDOMINAL HYSTERECTOMY     TAH SPO  . AORTIC VALVE REPLACEMENT  2014   with [facility]  . ATRIAL ABLATION SURGERY    . BLADDER SUSPENSION    . BREAST BIOPSY Right    Needle Biopsy  . DILATION AND CURETTAGE OF UTERUS     x 3  . loop heart    . SHOULDER SURGERY Left    arthroscopy for spurs  . TONSILLECTOMY    . TUBAL LIGATION      Family History  Problem Relation Age of Onset  . Colon polyps Father   . Alzheimer's disease Father   . Alcohol abuse Father   . Cancer Father        colon cancer  . Arthritis Mother        s/p TKR  . Neuropathy Mother   . Hyperlipidemia Mother   . Other Mother        familial mediterranean fever  . Thyroid disease Mother   . Hypertension Mother   . Obesity Mother   . Cancer Brother        prostate cancer  . Heart disease  Brother        cardiomegaly  . Other Brother        familial mediterranean fever  . Asthma Maternal Grandmother   . Congestive Heart Failure Maternal Grandmother   . Heart disease Maternal Grandmother        chf  . Stroke Maternal Grandfather   . Diabetes Maternal Grandfather   . Heart disease Maternal Grandfather        hardening of the arteries  . Stroke Paternal Grandfather   . Atrial fibrillation Paternal Grandfather   . Alcohol abuse Paternal Grandfather   . Heart disease Paternal Grandfather        afib  . Interstitial cystitis Daughter   . Arthritis Son   . Alcohol abuse Son        in remission  . Mental illness Son        depression  . Other Son        interstitial cystitis  . Colon cancer Other        parent  . Arthritis Paternal Uncle     Social History   Socioeconomic History  . Marital status: Married    Spouse name: Not on file  . Number of children: 2  . Years of education: Not on file  . Highest education level: Not on file  Occupational History  .  Occupation: Best boy: [employer]  Tobacco Use  . Smoking status: Never Smoker  . Smokeless tobacco: Never Used  Substance and Sexual Activity  . Alcohol use: No  . Drug use: No  . Sexual activity: Yes    Comment: lives with husband and adult son, no major dietary restrictions, full diability  Other Topics Concern  . Not on file  Social History Narrative  . Not on file   Social Determinants of Health   Financial Resource Strain:   . Difficulty of Paying Living Expenses:   Food Insecurity:   . Worried About Charity fundraiser in the Last Year:   . Arboriculturist in the Last Year:   Transportation Needs:   . Film/video editor (Medical):   Marland Kitchen Lack of Transportation (Non-Medical):   Physical Activity:   . Days of Exercise per Week:   . Minutes of Exercise per Session:   Stress:   . Feeling of Stress :   Social Connections:   . Frequency of Communication with Friends and Family:   . Frequency of Social Gatherings with Friends and Family:   . Attends Religious Services:   . Active Member of Clubs or Organizations:   . Attends Archivist Meetings:   Marland Kitchen Marital Status:   Intimate Partner Violence:   . Fear of Current or Ex-Partner:   . Emotionally Abused:   Marland Kitchen Physically Abused:   . Sexually Abused:     Outpatient Medications Prior to Visit  Medication Sig Dispense Refill  . amitriptyline (ELAVIL) 25 MG tablet Take 1 tablet (25 mg total) by mouth at bedtime.    Marland Kitchen atorvastatin (LIPITOR) 40 MG tablet Take 1 tablet (40 mg total) by mouth daily. 90 tablet 3  . busPIRone (BUSPAR) 10 MG tablet Take 1 tablet (10 mg total) by mouth 3 (three) times daily. 270 tablet 1  . cyclobenzaprine (FLEXERIL) 5 MG tablet Take 5 mg by mouth 3 (three) times daily as needed for muscle spasms.    . DULoxetine (CYMBALTA) 60 MG capsule Take 1 capsule (60 mg total) by  mouth 2 (two) times daily. 180 capsule 1  . esomeprazole (NEXIUM) 40 MG capsule Take 40 mg by  mouth daily at 12 noon.     Marland Kitchen HYDROcodone-acetaminophen (NORCO) 5-325 MG tablet Take 1 tablet by mouth every 6 (six) hours as needed for moderate pain. 60 tablet 0  . hydrOXYzine (ATARAX/VISTARIL) 25 MG tablet Take 1 tablet (25 mg total) by mouth at bedtime as needed. 90 tablet 1  . levothyroxine (SYNTHROID) 137 MCG tablet 1 tab daily as directed except no dose on Sunday 90 tablet 1  . losartan (COZAAR) 100 MG tablet Take 1 tablet (100 mg total) by mouth daily. 90 tablet 1  . Magnesium 400 MG CAPS Take 400 mg by mouth daily. Reported on 02/13/2016    . meloxicam (MOBIC) 15 MG tablet TAKE 1 TABLET BY MOUTH EVERY DAY AS NEEDED FOR PAIN 90 tablet 1  . Methen-Hyosc-Meth Blue-Na Phos (UROGESIC-BLUE) 81.6 MG TABS Take 1 tablet (81.6 mg total) by mouth 4 (four) times daily as needed.    . Metoprolol Succinate 100 MG CS24 Take 100 mg by mouth.    . Metoprolol Succinate 50 MG CS24 Take 50 mg by mouth.    . Misc Natural Products (FIBER 7 PO) Take by mouth.    . nitrofurantoin (MACRODANTIN) 100 MG capsule Take 100 mg by mouth 4 (four) times daily.    . ondansetron (ZOFRAN) 4 MG tablet Take 4 mg by mouth every 8 (eight) hours as needed for nausea or vomiting.    . rizatriptan (MAXALT) 10 MG tablet Take 10 mg by mouth as needed for migraine. May repeat in 2 hours if needed    . topiramate (TOPAMAX) 100 MG tablet Take 1 tablet (100 mg total) by mouth 2 (two) times daily.     No facility-administered medications prior to visit.    Allergies  Allergen Reactions  . Ciprofloxacin Rash and Shortness Of Breath  . Sulfa Antibiotics Hives  . Sulfasalazine Hives  . Cortisone Other (See Comments)    Red flush and anxiety  . Tramadol Other (See Comments)    headache  . Zonisamide Other (See Comments)    unk  . Erythromycin Rash  . Erythromycin Base Rash  . Levofloxacin Nausea Only  . Lisinopril     Other reaction(s): Cough (ALLERGY/intolerance)  . Prednisone Rash and Anxiety    Review of Systems    Constitutional: Positive for malaise/fatigue. Negative for fever.  HENT: Negative for congestion.   Eyes: Negative for blurred vision.  Respiratory: Negative for shortness of breath.   Cardiovascular: Negative for chest pain, palpitations and leg swelling.  Gastrointestinal: Negative for abdominal pain, blood in stool and nausea.  Genitourinary: Negative for dysuria and frequency.  Musculoskeletal: Positive for myalgias. Negative for falls.  Skin: Negative for rash.  Neurological: Negative for dizziness, loss of consciousness and headaches.  Endo/Heme/Allergies: Negative for environmental allergies.  Psychiatric/Behavioral: Negative for depression. The patient is not nervous/anxious.        Objective:    Physical Exam  BP (!) 142/80 (BP Location: Right Arm, Cuff Size: Large)   Pulse 76   Temp 97.9 F (36.6 C) (Temporal)   Resp 12   Ht 5\' 7"  (1.702 m)   Wt 233 lb (105.7 kg)   SpO2 98%   BMI 36.49 kg/m  Wt Readings from Last 3 Encounters:  02/17/20 233 lb (105.7 kg)  02/07/20 234 lb (106.1 kg)  01/27/20 234 lb (106.1 kg)    Diabetic Foot Exam - Simple  No data filed     Lab Results  Component Value Date   WBC 6.0 01/27/2020   HGB 10.8 (L) 01/27/2020   HCT 34.0 (L) 01/27/2020   PLT 295.0 01/27/2020   GLUCOSE 96 01/27/2020   CHOL 196 11/23/2019   TRIG 87.0 11/23/2019   HDL 71.50 11/23/2019   LDLDIRECT 103.0 06/03/2017   LDLCALC 107 (H) 11/23/2019   ALT 18 01/24/2020   AST 19 01/24/2020   NA 133 (L) 01/27/2020   K 4.7 01/27/2020   CL 100 01/27/2020   CREATININE 1.00 01/27/2020   BUN 20 01/27/2020   CO2 26 01/27/2020   TSH 0.967 01/24/2020   HGBA1C 5.7 (H) 01/24/2020    Lab Results  Component Value Date   TSH 0.967 01/24/2020   Lab Results  Component Value Date   WBC 6.0 01/27/2020   HGB 10.8 (L) 01/27/2020   HCT 34.0 (L) 01/27/2020   MCV 77.6 (L) 01/27/2020   PLT 295.0 01/27/2020   Lab Results  Component Value Date   NA 133 (L) 01/27/2020    K 4.7 01/27/2020   CO2 26 01/27/2020   GLUCOSE 96 01/27/2020   BUN 20 01/27/2020   CREATININE 1.00 01/27/2020   BILITOT 0.4 01/24/2020   ALKPHOS 79 01/24/2020   AST 19 01/24/2020   ALT 18 01/24/2020   PROT 6.3 01/24/2020   ALBUMIN 4.4 01/24/2020   CALCIUM 9.1 01/27/2020   GFR 55.69 (L) 01/27/2020   Lab Results  Component Value Date   CHOL 196 11/23/2019   Lab Results  Component Value Date   HDL 71.50 11/23/2019   Lab Results  Component Value Date   LDLCALC 107 (H) 11/23/2019   Lab Results  Component Value Date   TRIG 87.0 11/23/2019   Lab Results  Component Value Date   CHOLHDL 3 11/23/2019   Lab Results  Component Value Date   HGBA1C 5.7 (H) 01/24/2020       Assessment & Plan:   Problem List Items Addressed This Visit    Fibromyalgia    Continues to struggle with daily pain but stays as active as able      Hyperlipidemia    Encouraged heart healthy diet, increase exercise, avoid trans fats, consider a krill oil cap daily      Hypertension    Well controlled, no changes to meds. Encouraged heart healthy diet such as the DASH diet and exercise as tolerated.       Obesity    Is now following with healthy weight and wellness and is determined to attempt slow steady weight loss.       UTI (urinary tract infection)    Is following with urology Dr Amalia Hailey and they are treating recurrence of UTI. Encouraged daily probiotics and cranberry tabs.       Interstitial cystitis    They are trying botox injections for IC      Chronic low back pain with left-sided sciatica   Relevant Orders   DRUG MONITORING, PANEL 8 WITH CONFIRMATION, URINE   Vitamin D deficiency    Supplement and monitor       Other Visit Diagnoses    High risk medication use    -  Primary   Relevant Orders   DRUG MONITORING, PANEL 8 WITH CONFIRMATION, URINE      I am having Alexandra Patterson maintain her rizatriptan, Magnesium, esomeprazole, Misc Natural Products (FIBER 7 PO),  Urogesic-Blue, amitriptyline, meloxicam, losartan, hydrOXYzine, busPIRone, atorvastatin, DULoxetine, levothyroxine, topiramate, HYDROcodone-acetaminophen, cyclobenzaprine,  ondansetron, Metoprolol Succinate, Metoprolol Succinate, and nitrofurantoin.  No orders of the defined types were placed in this encounter.    Penni Homans, MD

## 2020-02-20 NOTE — Assessment & Plan Note (Signed)
Well controlled, no changes to meds. Encouraged heart healthy diet such as the DASH diet and exercise as tolerated.  °

## 2020-02-20 NOTE — Assessment & Plan Note (Signed)
>>  ASSESSMENT AND PLAN FOR CLASS 2 SEVERE OBESITY WITH SERIOUS COMORBIDITY AND BODY MASS INDEX (BMI) OF 36.0 TO 36.9 IN ADULT, UNSPECIFIED OBESITY TYPE (HCC) WRITTEN ON 02/20/2020  9:31 AM BY BLYTH, STACEY A, MD  Is now following with healthy weight and wellness and is determined to attempt slow steady weight loss.

## 2020-02-20 NOTE — Assessment & Plan Note (Signed)
Encouraged heart healthy diet, increase exercise, avoid trans fats, consider a krill oil cap daily 

## 2020-02-20 NOTE — Assessment & Plan Note (Addendum)
Is following with urology Dr Amalia Hailey and they are treating recurrence of UTI. Encouraged daily probiotics and cranberry tabs.

## 2020-02-20 NOTE — Assessment & Plan Note (Signed)
Continues to struggle with daily pain but stays as active as able

## 2020-02-20 NOTE — Assessment & Plan Note (Signed)
They are trying botox injections for IC

## 2020-02-20 NOTE — Assessment & Plan Note (Signed)
Is now following with healthy weight and wellness and is determined to attempt slow steady weight loss.

## 2020-02-20 NOTE — Assessment & Plan Note (Signed)
Supplement and monitor 

## 2020-02-22 ENCOUNTER — Ambulatory Visit (INDEPENDENT_AMBULATORY_CARE_PROVIDER_SITE_OTHER): Payer: PPO | Admitting: Family Medicine

## 2020-02-22 ENCOUNTER — Encounter (INDEPENDENT_AMBULATORY_CARE_PROVIDER_SITE_OTHER): Payer: Self-pay | Admitting: Family Medicine

## 2020-02-22 ENCOUNTER — Other Ambulatory Visit: Payer: Self-pay

## 2020-02-22 VITALS — BP 118/77 | HR 73 | Temp 98.1°F | Ht 67.0 in | Wt 233.0 lb

## 2020-02-22 DIAGNOSIS — R7303 Prediabetes: Secondary | ICD-10-CM

## 2020-02-22 DIAGNOSIS — Z6836 Body mass index (BMI) 36.0-36.9, adult: Secondary | ICD-10-CM

## 2020-02-22 DIAGNOSIS — I10 Essential (primary) hypertension: Secondary | ICD-10-CM | POA: Diagnosis not present

## 2020-02-22 NOTE — Progress Notes (Signed)
Chief Complaint:   OBESITY Alexandra Patterson is here to discuss her progress with her obesity treatment plan along with follow-up of her obesity related diagnoses. Alexandra Patterson is on keeping a food journal and adhering to recommended goals of 1300-1400 calories and 85+ grams of protein and states she is following her eating plan approximately 99% of the time. Alexandra Patterson states she is exercising for 0 minutes 0 times per week.  Today's visit was #: 3 Starting weight: 240 lbs Starting date: 01/24/2020 Today's weight: 233 lbs Today's date: 02/22/2020 Total lbs lost to date: 7 lbs Total lbs lost since last in-office visit: 1 lb  Interim History: Alexandra Patterson says she is still not really feeling well.  She has chronic UTI symptoms.  She has been able to eat but often tends to eat more carbohydrate rich foods as she is not feeling well.  She is journaling but is interested in a more structured plan.  Subjective:   1. Prediabetes Alexandra Patterson has a diagnosis of prediabetes based on her elevated HgA1c and was informed this puts her at greater risk of developing diabetes. She continues to work on diet and exercise to decrease her risk of diabetes. She denies nausea or hypoglycemia.  She is not on any medication.  Lab Results  Component Value Date   HGBA1C 5.7 (H) 01/24/2020   Lab Results  Component Value Date   INSULIN 5.9 01/24/2020   2. Essential hypertension Review: taking medications as instructed, no medication side effects noted, no chest pain on exertion, no dyspnea on exertion, no swelling of ankles.  Blood pressure is well-controlled.  BP Readings from Last 3 Encounters:  02/22/20 118/77  02/17/20 (!) 142/80  02/07/20 115/80   Assessment/Plan:   1. Prediabetes Alexandra Patterson will continue to work on weight loss, exercise, and decreasing simple carbohydrates to help decrease the risk of diabetes.  Will check labs in 2 months.  2. Essential hypertension Alexandra Patterson is working on healthy weight loss and exercise to improve blood pressure  control. We will watch for signs of hypotension as she continues her lifestyle modifications. Continue current medications.  3. Class 2 severe obesity with serious comorbidity and body mass index (BMI) of 36.0 to 36.9 in adult, unspecified obesity type Alexandra Patterson) Alexandra Patterson is currently in the action stage of change. As such, her goal is to continue with weight loss efforts. She has agreed to the Category 2 Plan +100 calories.   Exercise goals: No exercise has been prescribed at this time.  Behavioral modification strategies: increasing lean protein intake and meal planning and cooking strategies.  Alexandra Patterson has agreed to follow-up with our clinic in 2 weeks. She was informed of the importance of frequent follow-up visits to maximize her success with intensive lifestyle modifications for her multiple health conditions.   Objective:   Blood pressure 118/77, pulse 73, temperature 98.1 F (36.7 C), temperature source Oral, height 5\' 7"  (1.702 m), weight 233 lb (105.7 kg), SpO2 99 %. Body mass index is 36.49 kg/m.  General: Cooperative, alert, well developed, in no acute distress. HEENT: Conjunctivae and lids unremarkable. Cardiovascular: Regular rhythm.  Lungs: Normal work of breathing. Neurologic: No focal deficits.   Lab Results  Component Value Date   CREATININE 1.00 01/27/2020   BUN 20 01/27/2020   NA 133 (L) 01/27/2020   K 4.7 01/27/2020   CL 100 01/27/2020   CO2 26 01/27/2020   Lab Results  Component Value Date   ALT 18 01/24/2020   AST 19 01/24/2020  ALKPHOS 79 01/24/2020   BILITOT 0.4 01/24/2020   Lab Results  Component Value Date   HGBA1C 5.7 (H) 01/24/2020   HGBA1C 5.5 01/08/2018   Lab Results  Component Value Date   INSULIN 5.9 01/24/2020   Lab Results  Component Value Date   TSH 0.967 01/24/2020   Lab Results  Component Value Date   CHOL 196 11/23/2019   HDL 71.50 11/23/2019   LDLCALC 107 (H) 11/23/2019   LDLDIRECT 103.0 06/03/2017   TRIG 87.0 11/23/2019   CHOLHDL  3 11/23/2019   Lab Results  Component Value Date   WBC 6.0 01/27/2020   HGB 10.8 (L) 01/27/2020   HCT 34.0 (L) 01/27/2020   MCV 77.6 (L) 01/27/2020   PLT 295.0 01/27/2020   Attestation Statements:   Reviewed by clinician on day of visit: allergies, medications, problem list, medical history, surgical history, family history, social history, and previous encounter notes.  Time spent on visit including pre-visit chart review and post-visit care and charting was 20 minutes.   I, Water quality scientist, CMA, am acting as transcriptionist for Coralie Common, MD.  I have reviewed the above documentation for accuracy and completeness, and I agree with the above. - Jinny Blossom, MD

## 2020-02-24 ENCOUNTER — Other Ambulatory Visit: Payer: Self-pay | Admitting: Family Medicine

## 2020-02-24 ENCOUNTER — Encounter: Payer: Self-pay | Admitting: Family Medicine

## 2020-02-24 MED ORDER — HYDROCODONE-ACETAMINOPHEN 5-325 MG PO TABS: 1.0000 | ORAL_TABLET | Freq: Four times a day (QID) | ORAL | 0 refills | Status: DC | PRN

## 2020-02-24 NOTE — Telephone Encounter (Signed)
Last written: 01/11/20 Last ov: 02/17/20 Next ov: 07/27/20 Contract: 02/17/20 UDS: 02/17/20

## 2020-03-02 DIAGNOSIS — B9620 Unspecified Escherichia coli [E. coli] as the cause of diseases classified elsewhere: Secondary | ICD-10-CM | POA: Diagnosis not present

## 2020-03-02 DIAGNOSIS — N390 Urinary tract infection, site not specified: Secondary | ICD-10-CM | POA: Diagnosis not present

## 2020-03-02 DIAGNOSIS — R339 Retention of urine, unspecified: Secondary | ICD-10-CM | POA: Diagnosis not present

## 2020-03-02 DIAGNOSIS — N3010 Interstitial cystitis (chronic) without hematuria: Secondary | ICD-10-CM | POA: Diagnosis not present

## 2020-03-06 DIAGNOSIS — M79604 Pain in right leg: Secondary | ICD-10-CM | POA: Diagnosis not present

## 2020-03-06 DIAGNOSIS — M797 Fibromyalgia: Secondary | ICD-10-CM | POA: Diagnosis not present

## 2020-03-06 DIAGNOSIS — G43009 Migraine without aura, not intractable, without status migrainosus: Secondary | ICD-10-CM | POA: Diagnosis not present

## 2020-03-06 DIAGNOSIS — M79605 Pain in left leg: Secondary | ICD-10-CM | POA: Diagnosis not present

## 2020-03-08 ENCOUNTER — Ambulatory Visit (INDEPENDENT_AMBULATORY_CARE_PROVIDER_SITE_OTHER): Payer: PPO | Admitting: Family Medicine

## 2020-03-08 ENCOUNTER — Encounter (INDEPENDENT_AMBULATORY_CARE_PROVIDER_SITE_OTHER): Payer: Self-pay | Admitting: Family Medicine

## 2020-03-08 ENCOUNTER — Other Ambulatory Visit: Payer: Self-pay

## 2020-03-08 VITALS — BP 127/73 | HR 77 | Temp 98.0°F | Ht 67.0 in | Wt 235.0 lb

## 2020-03-08 DIAGNOSIS — R7303 Prediabetes: Secondary | ICD-10-CM

## 2020-03-08 DIAGNOSIS — R339 Retention of urine, unspecified: Secondary | ICD-10-CM | POA: Diagnosis not present

## 2020-03-08 DIAGNOSIS — Z6836 Body mass index (BMI) 36.0-36.9, adult: Secondary | ICD-10-CM | POA: Diagnosis not present

## 2020-03-08 DIAGNOSIS — E7849 Other hyperlipidemia: Secondary | ICD-10-CM

## 2020-03-09 DIAGNOSIS — N3010 Interstitial cystitis (chronic) without hematuria: Secondary | ICD-10-CM | POA: Diagnosis not present

## 2020-03-09 NOTE — Progress Notes (Signed)
Chief Complaint:   OBESITY Alexandra Patterson is here to discuss her progress with her obesity treatment plan along with follow-up of her obesity related diagnoses. Alexandra Patterson is on the Category 2 Plan and states she is following her eating plan approximately 0% of the time. Alexandra Patterson states she is exercising for 0 minutes 0 times per week.  Today's visit was #: 4 Starting weight: 240 lbs Starting date: 01/24/2020 Today's weight: 235 lbs Today's date: 03/08/2020 Total lbs lost to date: 5 lbs Total lbs lost since last in-office visit: 0  Interim History: Alexandra Patterson says she learned last week that she will have to self-catheterize.  She got Botox in her bladder, causing it to not completely empty.  She is currently getting her bathroom renovated as well.  She has not made nutritious choices and did not shop for plan and has been doing comfort eating.  She says that in 2 weeks she will be out of town at a conference.  Subjective:   1. Prediabetes Alexandra Patterson has a diagnosis of prediabetes based on her elevated HgA1c and was informed this puts her at greater risk of developing diabetes. She continues to work on diet and exercise to decrease her risk of diabetes. She denies nausea or hypoglycemia.  She is not on any medications.  Lab Results  Component Value Date   HGBA1C 5.7 (H) 01/24/2020   Lab Results  Component Value Date   INSULIN 5.9 01/24/2020   2. Other hyperlipidemia Alexandra Patterson has hyperlipidemia and has been trying to improve her cholesterol levels with intensive lifestyle modification including a low saturated fat diet, exercise and weight loss. She denies any chest pain, claudication or myalgias.  She is on Lipitor.  Lab Results  Component Value Date   ALT 18 01/24/2020   AST 19 01/24/2020   ALKPHOS 79 01/24/2020   BILITOT 0.4 01/24/2020   Lab Results  Component Value Date   CHOL 196 11/23/2019   HDL 71.50 11/23/2019   LDLCALC 107 (H) 11/23/2019   LDLDIRECT 103.0 06/03/2017   TRIG 87.0 11/23/2019   CHOLHDL 3  11/23/2019   Assessment/Plan:   1. Prediabetes Alexandra Patterson will continue to work on weight loss, exercise, and decreasing simple carbohydrates to help decrease the risk of diabetes.  Follow-up labs in 2 months.  2. Other hyperlipidemia Cardiovascular risk and specific lipid/LDL goals reviewed.  We discussed several lifestyle modifications today and Alexandra Patterson will continue to work on diet, exercise and weight loss efforts. Orders and follow up as documented in patient record.  Labs in 2 months.  Continue Category 2.  Counseling Intensive lifestyle modifications are the first line treatment for this issue. . Dietary changes: Increase soluble fiber. Decrease simple carbohydrates. . Exercise changes: Moderate to vigorous-intensity aerobic activity 150 minutes per week if tolerated. . Lipid-lowering medications: see documented in medical record.  3. Class 2 severe obesity with serious comorbidity and body mass index (BMI) of 36.0 to 36.9 in adult, unspecified obesity type Alexandra Patterson) Alexandra Patterson is currently in the action stage of change. As such, her goal is to continue with weight loss efforts. She has agreed to the Category 2 Plan +100 calories.   Exercise goals: No exercise has been prescribed at this time.  Behavioral modification strategies: increasing lean protein intake, meal planning and cooking strategies and emotional eating strategies.  Alexandra Patterson has agreed to follow-up with our clinic in 3 weeks. She was informed of the importance of frequent follow-up visits to maximize her success with intensive lifestyle modifications for  her multiple health conditions.   Objective:   Blood pressure 127/73, pulse 77, temperature 98 F (36.7 C), temperature source Oral, height 5\' 7"  (1.702 m), weight 235 lb (106.6 kg), SpO2 98 %. Body mass index is 36.81 kg/m.  General: Cooperative, alert, well developed, in no acute distress. HEENT: Conjunctivae and lids unremarkable. Cardiovascular: Regular rhythm.  Lungs: Normal work  of breathing. Neurologic: No focal deficits.   Lab Results  Component Value Date   CREATININE 1.00 01/27/2020   BUN 20 01/27/2020   NA 133 (L) 01/27/2020   K 4.7 01/27/2020   CL 100 01/27/2020   CO2 26 01/27/2020   Lab Results  Component Value Date   ALT 18 01/24/2020   AST 19 01/24/2020   ALKPHOS 79 01/24/2020   BILITOT 0.4 01/24/2020   Lab Results  Component Value Date   HGBA1C 5.7 (H) 01/24/2020   HGBA1C 5.5 01/08/2018   Lab Results  Component Value Date   INSULIN 5.9 01/24/2020   Lab Results  Component Value Date   TSH 0.967 01/24/2020   Lab Results  Component Value Date   CHOL 196 11/23/2019   HDL 71.50 11/23/2019   LDLCALC 107 (H) 11/23/2019   LDLDIRECT 103.0 06/03/2017   TRIG 87.0 11/23/2019   CHOLHDL 3 11/23/2019   Lab Results  Component Value Date   WBC 6.0 01/27/2020   HGB 10.8 (L) 01/27/2020   HCT 34.0 (L) 01/27/2020   MCV 77.6 (L) 01/27/2020   PLT 295.0 01/27/2020   Obesity Behavioral Intervention Documentation for Insurance:   Approximately 15 minutes were spent on the discussion below.  ASK: We discussed the diagnosis of obesity with Alexandra Patterson today and Alexandra Patterson agreed to give [place] permission to discuss obesity behavioral modification therapy today.  ASSESS: Alexandra Patterson has the diagnosis of obesity and her BMI today is 36.9. Alexandra Patterson is in the action stage of change.   ADVISE: Alexandra Patterson was educated on the multiple health risks of obesity as well as the benefit of weight loss to improve her health. She was advised of the need for long term treatment and the importance of lifestyle modifications to improve her current health and to decrease her risk of future health problems.  AGREE: Multiple dietary modification options and treatment options were discussed and Alexandra Patterson agreed to follow the recommendations documented in the above note.  ARRANGE: Alexandra Patterson was educated on the importance of frequent visits to treat obesity as outlined per CMS and USPSTF guidelines and agreed to  schedule her next follow up appointment today.  Attestation Statements:   Reviewed by clinician on day of visit: allergies, medications, problem list, medical history, surgical history, family history, social history, and previous encounter notes.  Time spent on visit including pre-visit chart review and post-visit care and charting was 15 minutes.   I, Water quality scientist, CMA, am acting as transcriptionist for Coralie Common, MD.  I have reviewed the above documentation for accuracy and completeness, and I agree with the above. - Jinny Blossom, MD

## 2020-03-23 ENCOUNTER — Encounter: Payer: Self-pay | Admitting: Family Medicine

## 2020-03-29 ENCOUNTER — Ambulatory Visit (INDEPENDENT_AMBULATORY_CARE_PROVIDER_SITE_OTHER): Payer: PPO | Admitting: Family Medicine

## 2020-03-31 ENCOUNTER — Telehealth: Payer: Self-pay | Admitting: Family Medicine

## 2020-03-31 NOTE — Progress Notes (Signed)
  Chronic Care Management   Note  03/31/2020 Name: Alexandra Patterson MRN: [id number] DOB: 06-06-1956  [name] is a 64 y.o. year old female who is a primary care patient of [name], MD. I reached out to [name] by phone today in response to a referral sent by Ms. Mariea Stable PCP, [name], MD.   Ms. Karow was given information about Chronic Care Management services today including:  1. CCM service includes personalized support from designated clinical staff supervised by her physician, including individualized plan of care and coordination with other care providers 2. 24/7 contact phone numbers for assistance for urgent and routine care needs. 3. Service will only be billed when office clinical staff spend 20 minutes or more in a month to coordinate care. 4. Only one practitioner may furnish and bill the service in a calendar month. 5. The patient may stop CCM services at any time (effective at the end of the month) by phone call to the office staff.   Patient agreed to services and verbal consent obtained.  This note is not being shared with the patient for the following reason: To respect privacy (The patient or proxy has requested that the information not be shared).  Follow up plan:   Earney Hamburg Upstream Scheduler

## 2020-04-05 ENCOUNTER — Encounter: Payer: Self-pay | Admitting: Family Medicine

## 2020-04-05 ENCOUNTER — Other Ambulatory Visit: Payer: Self-pay | Admitting: Family Medicine

## 2020-04-05 MED ORDER — HYDROCODONE-ACETAMINOPHEN 5-325 MG PO TABS: 1.0000 | ORAL_TABLET | Freq: Four times a day (QID) | ORAL | 0 refills | Status: DC | PRN

## 2020-04-17 ENCOUNTER — Other Ambulatory Visit: Payer: Self-pay | Admitting: *Deleted

## 2020-04-17 DIAGNOSIS — E7849 Other hyperlipidemia: Secondary | ICD-10-CM

## 2020-04-17 DIAGNOSIS — I10 Essential (primary) hypertension: Secondary | ICD-10-CM

## 2020-04-17 NOTE — Chronic Care Management (AMB) (Deleted)
Chronic Care Management Pharmacy  Name: Alexandra Patterson  MRN: [id number] DOB: 09-13-1956   Chief Complaint/ HPI  [name],  64 y.o. , female presents for their Initial CCM visit with the clinical pharmacist via telephone due to COVID-19 Pandemic.  PCP : [name], MD Patient Care Team: [name], MD as PCP - General (Family Medicine) Veneda Melter, MD as Referring Physician (Cardiology) Dudley Major, MD as Referring Physician (Internal Medicine) Lunette Stands Glori Bickers, MD as Referring Physician (Cardiothoracic Surgery) Kerin Perna., MD as Referring Physician (Neurology) Day, Melvenia Beam, [facility] as Pharmacist (Pharmacist)  Their chronic conditions include: Hypertension, Hyperlipidemia, Atrial Fibrillation, GERD, Hypothyroidism, Depression, Anxiety and Chronic pain, Insomnia, Migraine, Pre-Diabetes   Office Visits: 02/17/20: Patient presented to Dr. [name] for follow-up. No medication changes made.  01/27/20: Patient presented to Dr. Lorelei Pont for hyponatremia. Metoprolol changed to 50/100 mg. Desmopressin, turmeric stopped (patient no longer taking).  11/18/19: Patient presented to Dr. [name] for follow-up. Topiramate increased to 100 mg BID.    Consult Visit: 03/09/20: Patient presented to Dr. Amalia Hailey (Urology) for interstitial cystitis. Patient educated on Cath placement.  03/08/20: Patient presented to Dr. Adair Patter (Weight Management) for follow-up. No medication changes made.  03/06/20: Patient presented to Dr. Doy Hutching (Neurology) for follow-up. Migraines under control, no medication changes made.  03/02/20: Patient presented to Dr. Amalia Hailey (Urology) for interstitial cystitis. Patient to self cath.  02/22/20: Patient presented to Dr. Adair Patter (Weight Management) for follow-up. No medication changes made. 02/01/20: Patient presented to Dr. Amalia Hailey (Urology) for interstitial cystitis. Patient to start on botox injections. 01/04/20: Patient presented to Dr. Amalia Hailey (Urology) for  interstitial cystitis. Patient started on DDAVP 0.2 mg.   Allergies  Allergen Reactions  . Ciprofloxacin Rash and Shortness Of Breath  . Sulfa Antibiotics Hives  . Sulfasalazine Hives  . Cortisone Other (See Comments)    Red flush and anxiety  . Tramadol Other (See Comments)    headache  . Zonisamide Other (See Comments)    unk  . Erythromycin Rash  . Erythromycin Base Rash  . Levofloxacin Nausea Only  . Lisinopril     Other reaction(s): Cough (ALLERGY/intolerance)  . Prednisone Rash and Anxiety    Medications: Outpatient Encounter Medications as of 04/18/2020  Medication Sig  . amitriptyline (ELAVIL) 25 MG tablet Take 1 tablet (25 mg total) by mouth at bedtime.  Marland Kitchen atorvastatin (LIPITOR) 40 MG tablet Take 1 tablet (40 mg total) by mouth daily.  . busPIRone (BUSPAR) 10 MG tablet Take 1 tablet (10 mg total) by mouth 3 (three) times daily.  . cyclobenzaprine (FLEXERIL) 5 MG tablet Take 5 mg by mouth 3 (three) times daily as needed for muscle spasms.  . DULoxetine (CYMBALTA) 60 MG capsule Take 1 capsule (60 mg total) by mouth 2 (two) times daily.  Marland Kitchen esomeprazole (NEXIUM) 40 MG capsule Take 40 mg by mouth daily at 12 noon.   Marland Kitchen HYDROcodone-acetaminophen (NORCO) 5-325 MG tablet Take 1 tablet by mouth every 6 (six) hours as needed for moderate pain.  . hydrOXYzine (ATARAX/VISTARIL) 25 MG tablet Take 1 tablet (25 mg total) by mouth at bedtime as needed.  Marland Kitchen levothyroxine (SYNTHROID) 137 MCG tablet 1 tab daily as directed except no dose on Sunday  . losartan (COZAAR) 100 MG tablet Take 1 tablet (100 mg total) by mouth daily.  . Magnesium 400 MG CAPS Take 400 mg by mouth daily. Reported on 02/13/2016  . meloxicam (MOBIC) 15 MG tablet TAKE 1 TABLET BY MOUTH EVERY  DAY AS NEEDED FOR PAIN  . Methen-Hyosc-Meth Blue-Na Phos (UROGESIC-BLUE) 81.6 MG TABS Take 1 tablet (81.6 mg total) by mouth 4 (four) times daily as needed.  . Metoprolol Succinate 100 MG CS24 Take 100 mg by mouth.  . Metoprolol  Succinate 50 MG CS24 Take 50 mg by mouth.  . Misc Natural Products (FIBER 7 PO) Take by mouth.  . nitrofurantoin (MACRODANTIN) 50 MG capsule Take 50 mg by mouth daily.   . ondansetron (ZOFRAN) 4 MG tablet Take 4 mg by mouth every 8 (eight) hours as needed for nausea or vomiting.  . rizatriptan (MAXALT) 10 MG tablet Take 10 mg by mouth as needed for migraine. May repeat in 2 hours if needed  . topiramate (TOPAMAX) 100 MG tablet Take 1 tablet (100 mg total) by mouth 2 (two) times daily.   No facility-administered encounter medications on file as of 04/18/2020.     Current Diagnosis/Assessment:    Goals Addressed   None     AFIB   Patient is currently {CHL HP BP RATE/RHYTHM:[phone]} controlled. HR *** BPM  Patient has failed these meds in past: *** Patient is currently {CHL Controlled/[phone]} on the following medications:  Marland Kitchen Metoprolol Succinate 50 mg daily  . Metorpolol Succinate 100 mg daily  .    We discussed:  {CHL HP Upstream Pharmacy [phone]}  Plan  Continue {CHL HP Upstream Pharmacy Plans:[phone]}  Hypertension   BP goal is:  {CHL HP UPSTREAM Pharmacist BP ranges:[phone]}  Office blood pressures are  BP Readings from Last 3 Encounters:  03/08/20 127/73  02/22/20 118/77  02/17/20 (!) 142/80   CMP Latest Ref Rng & Units 01/27/2020 01/24/2020 11/23/2019  Glucose 70 - 99 mg/dL 96 86 97  BUN 6 - 23 mg/dL _0 Creatinine 0.40 - 1.20 mg/dL 1.00 0.81 1.05  Sodium 135 - 145 mEq/L 133(L) 127(L) 135  Potassium 3.5 - 5.1 mEq/L 4.7 4.4 4.7  Chloride 96 - 112 mEq/L 100 94(L) 103  CO2 19 - 32 mEq/L _1 Calcium 8.4 - 10.5 mg/dL 9.1 9.0 9.3  Total Protein 6.0 - 8.5 g/dL - 6.3 6.2  Total Bilirubin 0.0 - 1.2 mg/dL - 0.4 0.5  Alkaline Phos 39 - 117 IU/L - 79 56  AST 0 - 40 IU/L - 19 15  ALT 0 - 32 IU/L - 18 19   Patient checks BP at home {CHL HP BP Monitoring Frequency:[phone]} Patient home BP readings are ranging:  ***  Patient has failed these meds in the past: Irbesartan, lisinopril Patient is currently {CHL Controlled/[phone]} on the following medications:  . Losartan 100 mg daily  . Metoprolol Succinate 50 mg daily  . Metoprolol Succinate 100 mg daily   We discussed {CHL HP Upstream Pharmacy [phone]}  Plan  Continue {CHL HP Upstream Pharmacy Plans:[phone]}   Hyperlipidemia   LDL goal < ***  Lipid Panel     Component Value Date/Time   CHOL 196 11/23/2019 0953   TRIG 87.0 11/23/2019 0953   HDL 71.50 11/23/2019 0953   LDLCALC 107 (H) 11/23/2019 0953   LDLDIRECT 103.0 06/03/2017 1353    Hepatic Function Latest Ref Rng & Units 01/24/2020 11/23/2019 08/19/2019  Total Protein 6.0 - 8.5 g/dL 6.3 6.2 6.1  Albumin 3.8 - 4.8 g/dL 4.4 4.3 4.2  AST 0 - 40 IU/L _2 ALT 0 - 32 IU/L _3 Alk Phosphatase 39 - 117 IU/L 79 56 54  Total Bilirubin 0.0 - 1.2  mg/dL 0.4 0.5 0.6     The 10-year ASCVD risk score Mikey Bussing DC Jr., et al., 2013) is: 5.8%   Values used to calculate the score:     Age: 81 years     Sex: Female     Is Non-Hispanic African American: No     Diabetic: No     Tobacco smoker: No     Systolic Blood Pressure: 793 mmHg     Is BP treated: Yes     HDL Cholesterol: 71.5 mg/dL     Total Cholesterol: 196 mg/dL   Patient has failed these meds in past: Simvastatin 40 mg  Patient is currently {CHL Controlled/[phone]} on the following medications:  . Atorvastatin 40 mg daily   We discussed:  {CHL HP Upstream Pharmacy [phone]}  Plan  Continue {CHL HP Upstream Pharmacy Plans:[id number]}  Diabetes   A1c goal {A1c goals:23924}  Recent Relevant Labs: Lab Results  Component Value Date/Time   HGBA1C 5.7 (H) 01/24/2020 12:55 PM   HGBA1C 5.5 01/08/2018 01:35 PM   GFR 55.69 (L) 01/27/2020 02:22 PM   GFR 52.67 (L) 11/23/2019 09:53 AM    Last diabetic Eye exam: No results found for: HMDIABEYEEXA  Last diabetic  Foot exam: No results found for: HMDIABFOOTEX   Checking BG: {CHL HP Blood Glucose Monitoring [phone]}  Recent FBG Readings: *** Recent pre-meal BG readings: *** Recent 2hr PP BG readings:  *** Recent HS BG readings: ***  Patient has failed these meds in past: *** Patient is currently {CHL Controlled/[phone]} on the following medications: . ***  We discussed: {CHL HP Upstream Pharmacy [phone]}  Plan  Continue {CHL HP Upstream Pharmacy Plans:[id number]}  Depression / Anxiety   PHQ9 Score:  PHQ9 SCORE ONLY 01/24/2020 01/14/2019 06/20/2016  PHQ-9 Total Score _0 GAD7 Score: No flowsheet data found.  Patient has failed these meds in past: *** Patient is currently {CHL Controlled/[phone]} on the following medications:  . Amitriptyline 25 mg QHS  . Buspirone 10 mg TID  . Duloxetine 60 mg BID  . Hydroxyzine 25 mg QHS PRN   We discussed:  ***  Plan  Continue {CHL HP Upstream Pharmacy Plans:7036565295}  Hypothyroidism   Lab Results  Component Value Date/Time   TSH 0.967 01/24/2020 12:55 PM   TSH 0.92 11/23/2019 09:53 AM   FREET4 0.77 11/23/2019 09:53 AM   FREET4 1.11 08/19/2019 10:45 AM    Patient has failed these meds in past: *** Patient is currently {CHL Controlled/[phone]} on the following medications:  . ***  We discussed:  {CHL HP Upstream Pharmacy [phone]}  Plan  Continue {CHL HP Upstream Pharmacy Plans:[id number]}   Chronic Pain   Patient has failed these meds in past: *** Patient is currently {CHL Controlled/[phone]} on the following medications:  . Cyclobenzaprine 5 mg TID PRN  . Duloxetine 60 mg BID  . Hydrocodone-APAP 5-325 q6hr PRN  . Meloxicam 15 mg daily PRN   We discussed:  ***  Plan  Continue {CHL HP Upstream Pharmacy Plans:7036565295}   Migraines    Patient has failed these meds in past: *** Patient is currently {CHL  Controlled/[phone]} on the following medications:  . Rizatriptan 10 mg PRN  . Topiramate 100 mg BID   We discussed:  ***  Plan  Continue {CHL HP Upstream Pharmacy JQZES:[id number]}  GERD   Patient has failed these meds in past: *** Patient is currently {CHL Controlled/[phone]} on the following medications:  . Esomeprazole 40 mg daily  We discussed:  ***  Plan  Continue {CHL HP Upstream Pharmacy Plans:[phone]}  Misc / OTC    . Magnesium 400 mg daily  . Urogesic blue 81.6 QID PRN  . Fiber supplement  . Nitrofurantoin 50 mg daily  . Ondansetron 4 mg q8hr PRN    We discussed:  ***  Vaccines   Reviewed and discussed patient's vaccination history.    Immunization History  Administered Date(s) Administered  . Influenza,inj,Quad PF,6+ Mos 07/29/2019  . PFIZER SARS-COV-2 Vaccination 12/23/2019, 01/17/2020  . Tdap 05/26/2015  . Zoster 05/26/2015  . Zoster Recombinat (Shingrix) 05/29/2018, 08/01/2018    Plan  Recommended patient receive *** vaccine in *** office.   Medication Management   Pt uses *** pharmacy for all medications Uses pill box? {Yes or If no, why not?:20788} Pt endorses ***% compliance  We discussed: ***  Plan  {US Pharmacy THYH:88875}    Follow up: *** month phone visit  ***

## 2020-04-17 NOTE — Progress Notes (Signed)
AM

## 2020-04-18 ENCOUNTER — Ambulatory Visit: Payer: PPO | Admitting: Pharmacist

## 2020-04-18 DIAGNOSIS — I10 Essential (primary) hypertension: Secondary | ICD-10-CM

## 2020-04-18 DIAGNOSIS — E7849 Other hyperlipidemia: Secondary | ICD-10-CM

## 2020-04-18 NOTE — Chronic Care Management (AMB) (Signed)
Chronic Care Management Pharmacy  Name: Alexandra Patterson  MRN: [id number] DOB: [birth year]   Chief Complaint/ HPI  [name],  64 y.o. , female presents for their Initial CCM visit with the clinical pharmacist via telephone due to COVID-19 Pandemic.  PCP : [name], MD Patient Care Team: [name], MD as PCP - General (Family Medicine) Veneda Melter, MD as Referring Physician (Cardiology) Dudley Major, MD as Referring Physician (Internal Medicine) Lunette Stands Glori Bickers, MD as Referring Physician (Cardiothoracic Surgery) Kerin Perna., MD as Referring Physician (Neurology) Sruti Ayllon, Melvenia Beam, [facility] as Pharmacist (Pharmacist)  Their chronic conditions include: Atrial Fibrillation, Hypertension, Hyperlipidemia, Depression/Anxiety, Hypothyroidism, Chronic Pain, Migraine, GERD, Interstitial Cystitis   Office Visits: 02/17/20: Patient presented to Dr. [name] for follow-up. No medication changes made.   01/27/20: Patient presented to Dr. Lorelei Pont for hyponatremia. Metoprolol changed to 50/100 mg. Desmopressin, turmeric stopped (patient no longer taking).   11/18/19: Patient presented to Dr. [name] for follow-up. Topiramate increased to 100 mg BID.   Consult Visit: 03/09/20: Patient presented to Dr. Amalia Hailey (Urology) for interstitial cystitis. Patient educated on Cath placement.   03/08/20: Patient presented to Dr. Adair Patter (Weight Management) for follow-up. No medication changes made.   03/06/20: Patient presented to Dr. Doy Hutching (Neurology) for follow-up. Migraines under control, no medication changes made.   03/02/20: Patient presented to Dr. Amalia Hailey (Urology) for interstitial cystitis. Patient to self cath.   02/22/20: Patient presented to Dr. Adair Patter (Weight Management) for follow-up. No medication changes made.  02/01/20: Patient presented to Dr. Amalia Hailey (Urology) for interstitial cystitis. Patient to start on botox injections.  01/04/20: Patient presented to Dr. Amalia Hailey (Urology) for  interstitial cystitis. Patient started on DDAVP 0.2 mg.   Allergies  Allergen Reactions  . Ciprofloxacin Rash and Shortness Of Breath  . Sulfa Antibiotics Hives  . Sulfasalazine Hives  . Cortisone Other (See Comments)    Red flush and anxiety  . Tramadol Other (See Comments)    headache  . Zonisamide Other (See Comments)    unk  . Erythromycin Rash  . Erythromycin Base Rash  . Levofloxacin Nausea Only  . Lisinopril     Other reaction(s): Cough (ALLERGY/intolerance)  . Prednisone Rash and Anxiety    Medications: Outpatient Encounter Medications as of 04/18/2020  Medication Sig Note  . amitriptyline (ELAVIL) 25 MG tablet Take 1 tablet (25 mg total) by mouth at bedtime.   Marland Kitchen atorvastatin (LIPITOR) 40 MG tablet Take 1 tablet (40 mg total) by mouth daily.   . busPIRone (BUSPAR) 10 MG tablet Take 1 tablet (10 mg total) by mouth 3 (three) times daily.   . cyclobenzaprine (FLEXERIL) 5 MG tablet Take 5 mg by mouth 3 (three) times daily as needed for muscle spasms.   . DULoxetine (CYMBALTA) 60 MG capsule Take 1 capsule (60 mg total) by mouth 2 (two) times daily. 04/18/2020: Taking #2 daily vs BID  . esomeprazole (NEXIUM) 40 MG capsule Take 40 mg by mouth 2 (two) times daily before a meal.    . HYDROcodone-acetaminophen (NORCO) 5-325 MG tablet Take 1 tablet by mouth every 6 (six) hours as needed for moderate pain.   . hydrOXYzine (ATARAX/VISTARIL) 25 MG tablet Take 1 tablet (25 mg total) by mouth at bedtime as needed.   Marland Kitchen levothyroxine (SYNTHROID) 137 MCG tablet 1 tab daily as directed except no dose on Sunday   . losartan (COZAAR) 100 MG tablet Take 1 tablet (100 mg total) by mouth daily.   . Magnesium 400  MG CAPS Take 400 mg by mouth daily. Reported on 02/13/2016   . meloxicam (MOBIC) 15 MG tablet TAKE 1 TABLET BY MOUTH EVERY Dakia Schifano AS NEEDED FOR PAIN   . Methen-Hyosc-Meth Blue-Na Phos (UROGESIC-BLUE) 81.6 MG TABS Take 1 tablet (81.6 mg total) by mouth 4 (four) times daily as needed.   .  Metoprolol Succinate 100 MG CS24 Take 100 mg by mouth.   . Metoprolol Succinate 50 MG CS24 Take 50 mg by mouth.   . Misc Natural Products (FIBER 7 PO) Take by mouth.   . nitrofurantoin (MACRODANTIN) 50 MG capsule Take 50 mg by mouth daily.    . ondansetron (ZOFRAN) 4 MG tablet Take 4 mg by mouth every 8 (eight) hours as needed for nausea or vomiting.   . rizatriptan (MAXALT) 10 MG tablet Take 10 mg by mouth as needed for migraine. May repeat in 2 hours if needed   . topiramate (TOPAMAX) 100 MG tablet Take 1 tablet (100 mg total) by mouth 2 (two) times daily.    No facility-administered encounter medications on file as of 04/18/2020.   SDOH Screenings   Alcohol Screen:   . Last Alcohol Screening Score (AUDIT):   Depression (PHQ2-9): Medium Risk  . PHQ-2 Score: 9  Financial Resource Strain:   . Difficulty of Paying Living Expenses:   Food Insecurity:   . Worried About Charity fundraiser in the Last Year:   . [place] in the Last Year:   Housing:   . Last Housing Risk Score:   Physical Activity:   . Days of Exercise per Week:   . Minutes of Exercise per Session:   Social Connections:   . Frequency of Communication with Friends and Family:   . Frequency of Social Gatherings with Friends and Family:   . Attends Religious Services:   . Active Member of Clubs or Organizations:   . Attends Archivist Meetings:   Marland Kitchen Marital Status:   Stress:   . Feeling of Stress :   Tobacco Use: Low Risk   . Smoking Tobacco Use: Never Smoker  . Smokeless Tobacco Use: Never Used  Transportation Needs:   . Film/video editor (Medical):   Marland Kitchen Lack of Transportation (Non-Medical):      Current Diagnosis/Assessment:    Goals Addressed            This Visit's Progress   . Chronic Care Management Pharmacy Care Plan       CARE PLAN ENTRY (see longitudinal plan of care for additional care plan information)  Current Barriers:  . Chronic Disease Management support, education,  and care coordination needs related to Atrial Fibrillation, Hypertension, Hyperlipidemia, Depression/Anxiety, Hypothyroidism, Chronic Pain, Migraine, GERD, Interstitial Cystitis    Hypertension BP Readings from Last 3 Encounters:  03/08/20 127/73  02/22/20 118/77  02/17/20 (!) 142/80   . Pharmacist Clinical Goal(s): o Over the next 90 days, patient will work with PharmD and providers to maintain BP goal <140/90 . Current regimen:  . Losartan 100 mg daily  . Metoprolol Succinate 50 mg daily  . Metoprolol Succinate 100 mg daily  . Patient self care activities - Over the next 90 days, patient will: o Maintain hypertension medication regimen.  o Ensure daily salt intake < 2300 mg/Lavalle Skoda  Hyperlipidemia Lab Results  Component Value Date/Time   LDLCALC 107 (H) 11/23/2019 09:53 AM   LDLDIRECT 103.0 06/03/2017 01:53 PM   . Pharmacist Clinical Goal(s): o Over the next 90 days, patient will  work with PharmD and providers to achieve LDL goal < 100 . Current regimen:  o Atorvastatin 40 mg daily  . Patient self care activities - Over the next 90 days, patient will: o Maintain cholesterol medication regimen.   Health Maintenance  . Pharmacist Clinical Goal(s) o Over the next 90 days, patient will work with PharmD and providers to complete health maintenance screenings/vaccinations . Interventions: o Complete Pneumovax 23 at age 5 . Patient self care activities - Over the next 90 days, patient will: o Complete Pneumovax 23 vaccine  Medication management . Pharmacist Clinical Goal(s): o Over the next 90 days, patient will work with PharmD and providers to maintain optimal medication adherence . [facility] . Interventions o Comprehensive medication review performed. o Utilize [facility] pharmacy for medication synchronization, packaging and delivery . Patient self care activities - Over the next 90 days, patient will: o Focus on medication adherence by  filling and taking medications appropriately  o Take medications as prescribed o Report any questions or concerns to PharmD and/or provider(s)  Initial goal documentation      Social:  Married for 45 years. Lives with her husband.  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  She needs time alone to recharge,  Two children. Son and Daughter [name] works at [employer]). Two grandsons. Mother is still living at age 8. She is having to do more things for her. She is concerned about her mom. She thinks her mom might have heart failure. She knows her mom doesn't want to go to the nursing home.   AFIB   Patient is currently rate controlled.  Patient has failed these meds in past: None noted  Patient is currently controlled on the following medications:  Marland Kitchen Metoprolol Succinate 50 mg daily  . Metoprolol Succinate 100 mg daily   Doesn't report any symptoms  Plan -Continue current medications  Hypertension   BP goal is:  <140/90  Office blood pressures are  BP Readings from Last 3 Encounters:  03/08/20 127/73  02/22/20 118/77  02/17/20 (!) 142/80   CMP Latest Ref Rng & Units 01/27/2020 01/24/2020 11/23/2019  Glucose 70 - 99 mg/dL 96 86 97  BUN 6 - 23 mg/dL _0 Creatinine 0.40 - 1.20 mg/dL 1.00 0.81 1.05  Sodium 135 - 145 mEq/L 133(L) 127(L) 135  Potassium 3.5 - 5.1 mEq/L 4.7 4.4 4.7  Chloride 96 - 112 mEq/L 100 94(L) 103  CO2 19 - 32 mEq/L _1 Calcium 8.4 - 10.5 mg/dL 9.1 9.0 9.3  Total Protein 6.0 - 8.5 g/dL - 6.3 6.2  Total Bilirubin 0.0 - 1.2 mg/dL - 0.4 0.5  Alkaline Phos 39 - 117 IU/L - 79 56  AST 0 - 40 IU/L - 19 15  ALT 0 - 32 IU/L - 18 19   Patient checks BP at home infrequently Patient home BP readings are ranging: Unable to assess  Patient has failed these meds in the past: Irbesartan,  lisinopril Patient is currently controlled on the following medications:  . Losartan 100 mg daily  . Metoprolol Succinate 50 mg daily  . Metoprolol Succinate 100 mg daily   Plan -Continue current medications   Hyperlipidemia   LDL goal <100  Lipid Panel     Component Value Date/Time   CHOL 196 11/23/2019 0953   TRIG 87.0  11/23/2019 0953   HDL 71.50 11/23/2019 0953   LDLCALC 107 (H) 11/23/2019 0953   LDLDIRECT 103.0 06/03/2017 1353    Hepatic Function Latest Ref Rng & Units 01/24/2020 11/23/2019 08/19/2019  Total Protein 6.0 - 8.5 g/dL 6.3 6.2 6.1  Albumin 3.8 - 4.8 g/dL 4.4 4.3 4.2  AST 0 - 40 IU/L _0 ALT 0 - 32 IU/L _1 Alk Phosphatase 39 - 117 IU/L 79 56 54  Total Bilirubin 0.0 - 1.2 mg/dL 0.4 0.5 0.6     The 10-year ASCVD risk score Mikey Bussing DC Jr., et al., 2013) is: 5.8%   Values used to calculate the score:     Age: 39 years     Sex: Female     Is Non-Hispanic African American: No     Diabetic: No     Tobacco smoker: No     Systolic Blood Pressure: 381 mmHg     Is BP treated: Yes     HDL Cholesterol: 71.5 mg/dL     Total Cholesterol: 196 mg/dL   Patient has failed these meds in past: Simvastatin 40 mg  Patient is currently uncontrolled on the following medications:  . Atorvastatin 40 mg daily   Plan -Continue current medications   Depression / Anxiety   Unable to fully assess  PHQ9 Score:  PHQ9 SCORE ONLY 01/24/2020 01/14/2019 06/20/2016  PHQ-9 Total Score _2 GAD7 Score: No flowsheet data found.  Patient has failed these meds in past: venlafaxine, olanzapine (listed in D/C meds. No apparent reason for D/C) Patient is currently controlled on the following medications:  . Buspirone 10 mg TID (usually takes twice daily) . Duloxetine 60 mg #2 daily  Plan -Continue current medications   Future Plan -Perform GAD7 and PHQ9  Hypothyroidism   Lab Results  Component Value Date/Time   TSH 0.967 01/24/2020 12:55 PM   TSH 0.92  11/23/2019 09:53 AM   FREET4 0.77 11/23/2019 09:53 AM   FREET4 1.11 08/19/2019 10:45 AM    Patient has failed these meds in past: None noted  Patient is currently controlled on the following medications:  . Levothyroxine 19mg daily  Plan -Continue current medications   Chronic Pain   Patient has failed these meds in past: tramadol (headache) Patient is currently controlled on the following medications:  . Cyclobenzaprine 5 mg TID PRN (helps her not to have headache and to sleep at night) . Duloxetine 60 mg #2 daily . Hydrocodone-APAP 5-325 q6hr PRN  . Meloxicam 15 mg daily PRN   Cyclobenzaprine - uses one daily Pain Scale - standing still no pain, when moving/walking 5   Plan -Continue current medications   Migraines    Patient has failed these meds in past: None noted  Patient is currently controlled on the following medications:  . Rizatriptan 10 mg PRN  . Topiramate 100 mg BID   Migraine Frequency: Depending on the weather and occurrence of triggers Triggers: weather, not eating  Topiramate: "Has really made the difference in the world"  She used to have 2-3 per week and they got so severe she wanted to die.    Plan -Continue current medications  GERD   Patient has failed these meds in past: None noted  Patient is currently controlled on the following medications:  . Esomeprazole 40 mg twice daily  Very severe. Tried using 230mtwice daily   Plan -Continue current medications   Interstitial Cystitis    Followed by Urology (  Dr. Amalia Hailey) - specialist in interstitial cystitis   Patient has failed these meds in past: None noted  Patient is currently controlled on the following medications:  Hydroxyzine 25 mg QHS PRN   Amitriptyline 25 mg QHS (for bladder per patient?)  Botox caused bladder to retain urine. She was told she could never get botox again. States she was given amitriptyline for incontinence.   Can't drink orange juice because this  would burn her bladder. There are certain foods/drinks she can't eat/drink due to causing burning in the bladder.  Plan -Continue current medications   Misc / OTC    . Magnesium complex 516m daily (takes it to replace magnesium and for constipation) . Urogesic blue 81.6 QID PRN (per Dr. EAmalia Hailey . Nitrofurantoin 50 mg daily (per Dr. EAmalia Hailey . Ondansetron 4 mg q8hr PRN   . Probiotic . Fiber . Vitamin D3 5000 units   Vaccines   Reviewed and discussed patient's vaccination history.    Immunization History  Administered Date(s) Administered  . Influenza,inj,Quad PF,6+ Mos 07/29/2019  . PFIZER SARS-COV-2 Vaccination 12/23/2019, 01/17/2020  . Tdap 05/26/2015  . Zoster 05/26/2015  . Zoster Recombinat (Shingrix) 05/29/2018, 08/01/2018    Plan  Recommended patient receive Pneumovax 23 at age [age]vaccine in pharmacy.   Medication Management   [facility] Uses [facility] box? No  She is very interested in switching to [facility]  Plan Utilize [facility] pharmacy for medication synchronization, packaging and delivery   Verbal consent obtained for [facility] enhanced pharmacy services (medication synchronization, adherence packaging, delivery coordination). A medication sync plan was created to allow patient to get all medications delivered once every 30 to 90 days per patient preference. Patient understands they have freedom to choose pharmacy and clinical pharmacist will coordinate care between all prescribers and [facility].

## 2020-04-22 NOTE — Patient Instructions (Signed)
Visit Information  Goals Addressed            This Visit's Progress   . Chronic Care Management Pharmacy Care Plan       CARE PLAN ENTRY (see longitudinal plan of care for additional care plan information)  Current Barriers:  . Chronic Disease Management support, education, and care coordination needs related to Atrial Fibrillation, Hypertension, Hyperlipidemia, Depression/Anxiety, Hypothyroidism, Chronic Pain, Migraine, GERD, Interstitial Cystitis    Hypertension BP Readings from Last 3 Encounters:  03/08/20 127/73  02/22/20 118/77  02/17/20 (!) 142/80   . Pharmacist Clinical Goal(s): o Over the next 90 days, patient will work with PharmD and providers to maintain BP goal <140/90 . Current regimen:  . Losartan 100 mg daily  . Metoprolol Succinate 50 mg daily  . Metoprolol Succinate 100 mg daily  . Patient self care activities - Over the next 90 days, patient will: o Maintain hypertension medication regimen.  o Ensure daily salt intake < 2300 mg/Helio Lack  Hyperlipidemia Lab Results  Component Value Date/Time   LDLCALC 107 (H) 11/23/2019 09:53 AM   LDLDIRECT 103.0 06/03/2017 01:53 PM   . Pharmacist Clinical Goal(s): o Over the next 90 days, patient will work with PharmD and providers to achieve LDL goal < 100 . Current regimen:  o Atorvastatin 40 mg daily  . Patient self care activities - Over the next 90 days, patient will: o Maintain cholesterol medication regimen.   Health Maintenance  . Pharmacist Clinical Goal(s) o Over the next 90 days, patient will work with PharmD and providers to complete health maintenance screenings/vaccinations . Interventions: o Complete Pneumovax 23 at age 64 . Patient self care activities - Over the next 90 days, patient will: o Complete Pneumovax 23 vaccine  Medication management . Pharmacist Clinical Goal(s): o Over the next 90 days, patient will work with PharmD and providers to maintain optimal medication adherence . [facility] . Interventions o Comprehensive medication review performed. o Utilize [facility] for medication synchronization, packaging and delivery . Patient self care activities - Over the next 90 days, patient will: o Focus on medication adherence by filling and taking medications appropriately  o Take medications as prescribed o Report any questions or concerns to PharmD and/or provider(s)  Initial goal documentation        Alexandra Patterson was given information about Chronic Care Management services today including:  1. CCM service includes personalized support from designated clinical staff supervised by her physician, including individualized plan of care and coordination with other care providers 2. 24/7 contact phone numbers for assistance for urgent and routine care needs. 3. Standard insurance, coinsurance, copays and deductibles apply for chronic care management only during months in which we provide at least 20 minutes of these services. Most insurances cover these services at 100%, however patients may be responsible for any copay, coinsurance and/or deductible if applicable. This service may help you avoid the need for more expensive face-to-face services. 4. Only one practitioner may furnish and bill the service in a calendar month. 5. The patient may stop CCM services at any time (effective at the end of the month) by phone call to the office staff.  Verbal consent obtained for [facility] enhanced pharmacy services (medication synchronization, adherence packaging, delivery coordination). A medication sync plan was created to allow patient to get all medications delivered once every 30 to 90 days per patient preference. Patient understands they have freedom to choose pharmacy and clinical pharmacist will  coordinate care between all prescribers and [facility].  Patient agreed to services and verbal consent obtained.   The patient  verbalized understanding of instructions provided today and agreed to receive a mailed copy of patient instruction and/or educational materials. Telephone follow up appointment with pharmacy team member scheduled for: 06/20/2020  Melvenia Beam Alexandra Patterson, PharmD Clinical Pharmacist [facility] [phone]  Pneumococcal Vaccine, Polyvalent solution for injection What is this medicine? PNEUMOCOCCAL VACCINE, POLYVALENT (NEU mo KOK al vak SEEN, pol ee VEY luhnt) is a vaccine to prevent pneumococcus bacteria infection. These bacteria are a major cause of ear infections, Strep throat infections, and serious pneumonia, meningitis, or blood infections worldwide. These vaccines help the body to produce antibodies (protective substances) that help your body defend against these bacteria. This vaccine is recommended for people 46 years of age and older with health problems. It is also recommended for all adults over 65 years old. This vaccine will not treat an infection. This medicine may be used for other purposes; ask your health care provider or pharmacist if you have questions. COMMON BRAND NAME(S): Pneumovax 23 What should I tell my health care provider before I take this medicine? They need to know if you have any of these conditions:  bleeding problems  bone marrow or organ transplant  cancer, Hodgkin's disease  fever  infection  immune system problems  low platelet count in the blood  seizures  an unusual or allergic reaction to pneumococcal vaccine, diphtheria toxoid, other vaccines, latex, other medicines, foods, dyes, or preservatives  pregnant or trying to get pregnant  breast-feeding How should I use this medicine? This vaccine is for injection into a muscle or under the skin. It is given by a health care professional. A copy of Vaccine Information Statements will be given before each vaccination. Read this sheet carefully each time. The sheet may change  frequently. Talk to your pediatrician regarding the use of this medicine in children. While this drug may be prescribed for children as young as 39 years of age for selected conditions, precautions do apply. Overdosage: If you think you have taken too much of this medicine contact a poison control center or emergency room at once. NOTE: This medicine is only for you. Do not share this medicine with others. What if I miss a dose? It is important not to miss your dose. Call your doctor or health care professional if you are unable to keep an appointment. What may interact with this medicine?  medicines for cancer chemotherapy  medicines that suppress your immune function  medicines that treat or prevent blood clots like warfarin, enoxaparin, and dalteparin  steroid medicines like prednisone or cortisone This list may not describe all possible interactions. Give your health care provider a list of all the medicines, herbs, non-prescription drugs, or dietary supplements you use. Also tell them if you smoke, drink alcohol, or use illegal drugs. Some items may interact with your medicine. What should I watch for while using this medicine? Mild fever and pain should go away in 3 days or less. Report any unusual symptoms to your doctor or health care professional. What side effects may I notice from receiving this medicine? Side effects that you should report to your doctor or health care professional as soon as possible:  allergic reactions like skin rash, itching or hives, swelling of the face, lips, or tongue  breathing problems  confused  fever over 102 degrees F  pain, tingling, numbness in the  hands or feet  seizures  unusual bleeding or bruising  unusual muscle weakness Side effects that usually do not require medical attention (report to your doctor or health care professional if they continue or are bothersome):  aches and pains  diarrhea  fever of 102 degrees F or  less  headache  irritable  loss of appetite  pain, tender at site where injected  trouble sleeping This list may not describe all possible side effects. Call your doctor for medical advice about side effects. You may report side effects to FDA at 1-800-FDA-1088. Where should I keep my medicine? This does not apply. This vaccine is given in a clinic, pharmacy, doctor's office, or other health care setting and will not be stored at home. NOTE: This sheet is a summary. It may not cover all possible information. If you have questions about this medicine, talk to your doctor, pharmacist, or health care provider.  2020 Elsevier/Gold Standard (2008-04-22 14:32:37)

## 2020-04-24 ENCOUNTER — Other Ambulatory Visit: Payer: Self-pay | Admitting: *Deleted

## 2020-04-24 MED ORDER — ATORVASTATIN CALCIUM 40 MG PO TABS: 40.0000 mg | ORAL_TABLET | Freq: Every day | ORAL | 1 refills | Status: DC

## 2020-04-24 MED ORDER — METOPROLOL SUCCINATE 50 MG PO CS24: 50.0000 mg | EXTENDED_RELEASE_CAPSULE | Freq: Every morning | ORAL | 1 refills | Status: DC

## 2020-04-24 MED ORDER — BUSPIRONE HCL 10 MG PO TABS: 10.0000 mg | ORAL_TABLET | Freq: Three times a day (TID) | ORAL | 1 refills | Status: DC

## 2020-04-24 MED ORDER — METOPROLOL SUCCINATE 100 MG PO CS24: 100.0000 mg | EXTENDED_RELEASE_CAPSULE | Freq: Every morning | ORAL | 1 refills | Status: DC

## 2020-04-24 MED ORDER — LEVOTHYROXINE SODIUM 137 MCG PO TABS: ORAL_TABLET | ORAL | 1 refills | Status: DC

## 2020-04-24 MED ORDER — LOSARTAN POTASSIUM 100 MG PO TABS: 100.0000 mg | ORAL_TABLET | Freq: Every day | ORAL | 1 refills | Status: DC

## 2020-04-24 MED ORDER — MELOXICAM 15 MG PO TABS: ORAL_TABLET | ORAL | 1 refills | Status: DC

## 2020-04-24 MED ORDER — DULOXETINE HCL 60 MG PO CPEP: 60.0000 mg | ORAL_CAPSULE | Freq: Two times a day (BID) | ORAL | 1 refills | Status: DC

## 2020-04-25 ENCOUNTER — Other Ambulatory Visit: Payer: Self-pay | Admitting: Family Medicine

## 2020-04-25 ENCOUNTER — Telehealth: Payer: Self-pay

## 2020-04-25 MED ORDER — METOPROLOL SUCCINATE ER 100 MG PO TB24: 100.0000 mg | ORAL_TABLET | Freq: Every day | ORAL | 1 refills | Status: DC

## 2020-04-25 NOTE — Telephone Encounter (Signed)
Kapspargo sprinkle 100mg  ER capsules not covered by insurance. Preferred alternatives: metoprolol succinate ER, metoprolol tartrate

## 2020-04-25 NOTE — Telephone Encounter (Signed)
I sent in Metoprolol Succinate 100 mg tablet. 1 tab daily

## 2020-04-28 DIAGNOSIS — Q208 Other congenital malformations of cardiac chambers and connections: Secondary | ICD-10-CM | POA: Diagnosis not present

## 2020-04-28 DIAGNOSIS — Z882 Allergy status to sulfonamides status: Secondary | ICD-10-CM | POA: Diagnosis not present

## 2020-04-28 DIAGNOSIS — Z95818 Presence of other cardiac implants and grafts: Secondary | ICD-10-CM | POA: Diagnosis not present

## 2020-04-28 DIAGNOSIS — R399 Unspecified symptoms and signs involving the genitourinary system: Secondary | ICD-10-CM | POA: Diagnosis not present

## 2020-04-28 DIAGNOSIS — Z9889 Other specified postprocedural states: Secondary | ICD-10-CM | POA: Diagnosis not present

## 2020-04-28 DIAGNOSIS — I4819 Other persistent atrial fibrillation: Secondary | ICD-10-CM | POA: Diagnosis not present

## 2020-04-28 DIAGNOSIS — Z888 Allergy status to other drugs, medicaments and biological substances status: Secondary | ICD-10-CM | POA: Diagnosis not present

## 2020-04-28 DIAGNOSIS — Z79899 Other long term (current) drug therapy: Secondary | ICD-10-CM | POA: Diagnosis not present

## 2020-04-28 DIAGNOSIS — Z881 Allergy status to other antibiotic agents status: Secondary | ICD-10-CM | POA: Diagnosis not present

## 2020-04-28 DIAGNOSIS — I480 Paroxysmal atrial fibrillation: Secondary | ICD-10-CM | POA: Diagnosis not present

## 2020-04-28 DIAGNOSIS — Z952 Presence of prosthetic heart valve: Secondary | ICD-10-CM | POA: Diagnosis not present

## 2020-05-11 ENCOUNTER — Encounter: Payer: Self-pay | Admitting: Family Medicine

## 2020-05-15 ENCOUNTER — Other Ambulatory Visit: Payer: Self-pay | Admitting: Family Medicine

## 2020-05-15 MED ORDER — HYDROCODONE-ACETAMINOPHEN 5-325 MG PO TABS: 1.0000 | ORAL_TABLET | Freq: Four times a day (QID) | ORAL | 0 refills | Status: DC | PRN

## 2020-05-21 DIAGNOSIS — I4891 Unspecified atrial fibrillation: Secondary | ICD-10-CM | POA: Diagnosis not present

## 2020-05-21 DIAGNOSIS — Z4509 Encounter for adjustment and management of other cardiac device: Secondary | ICD-10-CM | POA: Diagnosis not present

## 2020-05-22 DIAGNOSIS — Z4509 Encounter for adjustment and management of other cardiac device: Secondary | ICD-10-CM | POA: Diagnosis not present

## 2020-05-22 DIAGNOSIS — I4891 Unspecified atrial fibrillation: Secondary | ICD-10-CM | POA: Diagnosis not present

## 2020-06-08 ENCOUNTER — Telehealth: Payer: Self-pay | Admitting: Pharmacist

## 2020-06-08 ENCOUNTER — Other Ambulatory Visit: Payer: Self-pay | Admitting: *Deleted

## 2020-06-08 MED ORDER — METOPROLOL SUCCINATE 50 MG PO CS24: 50.0000 mg | EXTENDED_RELEASE_CAPSULE | Freq: Every morning | ORAL | 1 refills | Status: DC

## 2020-06-08 NOTE — Progress Notes (Addendum)
**Note Alexandra-Identified via Obfuscation** Chronic Care Management Pharmacy Assistant   Name: Alexandra Patterson  MRN: [id number] DOB: 11/12/1955  Reason for Encounter: Medication Review  PCP : Alexandra Lukes, MD  Allergies:   Allergies  Allergen Reactions  . Ciprofloxacin Rash and Shortness Of Breath  . Sulfa Antibiotics Hives  . Sulfasalazine Hives  . Cortisone Other (See Comments)    Red flush and anxiety  . Tramadol Other (See Comments)    headache  . Zonisamide Other (See Comments)    unk  . Erythromycin Rash  . Erythromycin Base Rash  . Levofloxacin Nausea Only  . Lisinopril     Other reaction(s): Cough (ALLERGY/intolerance)  . Prednisone Rash and Anxiety    Medications: Outpatient Encounter Medications as of 06/08/2020  Medication Sig  . amitriptyline (ELAVIL) 25 MG tablet Take 1 tablet (25 mg total) by mouth at bedtime.  Marland Kitchen atorvastatin (LIPITOR) 40 MG tablet Take 1 tablet (40 mg total) by mouth daily.  . busPIRone (BUSPAR) 10 MG tablet Take 1 tablet (10 mg total) by mouth 3 (three) times daily.  . cyclobenzaprine (FLEXERIL) 5 MG tablet Take 5 mg by mouth 3 (three) times daily as needed for muscle spasms.  . DULoxetine (CYMBALTA) 60 MG capsule Take 1 capsule (60 mg total) by mouth 2 (two) times daily.  Marland Kitchen esomeprazole (NEXIUM) 40 MG capsule Take 40 mg by mouth 2 (two) times daily before a meal.   . HYDROcodone-acetaminophen (NORCO) 5-325 MG tablet Take 1 tablet by mouth every 6 (six) hours as needed for moderate pain.  . hydrOXYzine (ATARAX/VISTARIL) 25 MG tablet Take 1 tablet (25 mg total) by mouth at bedtime as needed.  Marland Kitchen levothyroxine (SYNTHROID) 137 MCG tablet 1 tab daily as directed except no dose on Sunday  . losartan (COZAAR) 100 MG tablet Take 1 tablet (100 mg total) by mouth daily.  . Magnesium 400 MG CAPS Take 400 mg by mouth daily. Reported on 02/13/2016  . meloxicam (MOBIC) 15 MG tablet TAKE 1 TABLET BY MOUTH EVERY DAY AS NEEDED FOR PAIN  . Methen-Hyosc-Meth Blue-Na Phos (UROGESIC-BLUE) 81.6 MG TABS  Take 1 tablet (81.6 mg total) by mouth 4 (four) times daily as needed.  . metoprolol succinate (TOPROL-XL) 100 MG 24 hr tablet Take 1 tablet (100 mg total) by mouth daily. Take with or immediately following a meal.  . Metoprolol Succinate 50 MG CS24 Take 50 mg by mouth in the morning.  . Misc Natural Products (FIBER 7 PO) Take by mouth.  . nitrofurantoin (MACRODANTIN) 50 MG capsule Take 50 mg by mouth daily.   . ondansetron (ZOFRAN) 4 MG tablet Take 4 mg by mouth every 8 (eight) hours as needed for nausea or vomiting.  . rizatriptan (MAXALT) 10 MG tablet Take 10 mg by mouth as needed for migraine. May repeat in 2 hours if needed  . topiramate (TOPAMAX) 100 MG tablet Take 1 tablet (100 mg total) by mouth 2 (two) times daily.   No facility-administered encounter medications on file as of 06/08/2020.    Current Diagnosis: Patient Active Problem List   Diagnosis Date Noted  . Vitamin D deficiency 11/18/2019  . Headache 07/25/2019  . Educated about COVID-19 virus infection 05/23/2019  . Chronic low back pain with left-sided sciatica 05/17/2018  . Urinary frequency 05/17/2018  . Fatigue 05/14/2018  . Interstitial cystitis 05/14/2018  . 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 Shoulder pain 06/03/2017  . Preventative health care 02/03/2017  . Depression with anxiety 06/29/2016  . Thyroiditis, subacute 06/04/2016  .  UTI (urinary tract infection) 02/11/2016  . Cervical cancer screening 09/07/2015  . Cough 08/13/2015  . Insomnia 08/13/2015  . Acute stress reaction 04/25/2015  . Fibromyalgia   . Lesion of lung   . Allergy   . GERD (gastroesophageal reflux disease)   . Hyperlipidemia   . Hypertension   . Migraine   . Hyperplastic colon polyp   . Atrial fibrillation ([place])   . Hx of blood clots   . Obesity   . History of chicken pox     Goals Addressed   None    Reviewed chart for medication changes ahead of medication coordination call.  Consults: 05-22-2020 (Cardiology) Remote Device  check 04-28-2020 (Cardiology) OV with Dr. Rosita Fire. 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   No medication changes indicated.  BP Readings from Last 3 Encounters:  03/08/20 127/73  02/22/20 118/77  02/17/20 (!) 142/80    Lab Results  Component Value Date   HGBA1C 5.7 (H) 01/24/2020     Patient is going to obtain medications through Adherence Packaging  90 Days   Called patient and reviewed medications and coordinated delivery.  This delivery to include an acute fill for Metoprolol Succinate 50mg  to be delivered 06-12-2020.  Patient declined the following medications: Hydrocodone 5mg  Doxycycline Hyclate 100mg  [place] Sprinkle 100mg  Buspirone 10mg   Amitriptyline 25mg  Hydroxyzine Hcl 25mg  Rizatriptan 10mg  Urogesi-Blue tab Cyclobenzaprine 5mg  Losartan 100mg  Levothyroxine 137mg  [place] Sprinkle 50mg  Duloxetine 60mg   Meloxicam 15mg  Atorvastatin 40mg  Metoprolol 100mg  Topiramate 100mg  due to having enough on hand. Patient is in the process of becoming synced with all her medication to be in packaging for a 90 day supply.  Patient does not need any refills at this time.  Confirmed delivery date of 06-12-2020, advised patient that pharmacy will contact them the morning of delivery.  Made another outbound call to patient and [facility] to reschedule CCM visit for 07-13-2020 @ 3:30pm  Follow-Up:  Coordination of Enhanced Pharmacy Services and Scheduled Follow-Up With Clinical Pharmacist   Alexandra Patterson, [place] Pharmacist Assistant [phone]   Spoke to patient's daughter, Alexandra Patterson and confirmed appt time of 07/13/20 at 3:30pm for patient. Reviewed by: Alexandra Patterson, PharmD Clinical Pharmacist [facility] [phone]

## 2020-06-09 ENCOUNTER — Other Ambulatory Visit: Payer: Self-pay | Admitting: *Deleted

## 2020-06-09 MED ORDER — METOPROLOL SUCCINATE ER 100 MG PO TB24: 100.0000 mg | ORAL_TABLET | Freq: Every day | ORAL | 1 refills | Status: DC

## 2020-06-12 ENCOUNTER — Other Ambulatory Visit: Payer: Self-pay | Admitting: *Deleted

## 2020-06-12 MED ORDER — METOPROLOL SUCCINATE ER 50 MG PO TB24
50.0000 mg | ORAL_TABLET | Freq: Every day | ORAL | 1 refills | Status: DC
Start: 2020-06-12 — End: 2020-10-19

## 2020-06-20 ENCOUNTER — Other Ambulatory Visit: Payer: Self-pay | Admitting: Family Medicine

## 2020-06-20 ENCOUNTER — Telehealth: Payer: PPO

## 2020-06-20 ENCOUNTER — Encounter: Payer: Self-pay | Admitting: Family Medicine

## 2020-06-20 MED ORDER — HYDROCODONE-ACETAMINOPHEN 5-325 MG PO TABS: 1.0000 | ORAL_TABLET | Freq: Four times a day (QID) | ORAL | 0 refills | Status: DC | PRN

## 2020-06-21 ENCOUNTER — Ambulatory Visit: Payer: Self-pay | Admitting: Pharmacist

## 2020-06-21 NOTE — Chronic Care Management (AMB) (Signed)
Patient called to confirm that her hydrocodone could be delivered today.  Confirmed with patient and pharmacy that it could.

## 2020-06-28 ENCOUNTER — Telehealth: Payer: Self-pay | Admitting: Pharmacist

## 2020-06-28 NOTE — Progress Notes (Addendum)
Verified Adherence Gap Information per insurance data. [employer] insurance data states patient is 100% compliance with Atorvastatin and Losartan.    Alexandra Patterson, [facility] [place] Pharmacist Assistant [phone]  Reviewed by: De Blanch, PharmD Clinical Pharmacist [facility] [place] [phone]

## 2020-06-30 ENCOUNTER — Telehealth: Payer: Self-pay | Admitting: Pharmacist

## 2020-06-30 NOTE — Progress Notes (Addendum)
Chronic Care Management Pharmacy Assistant   Name: Alexandra Patterson  MRN: [id number] DOB: 04-04-1956  Reason for Encounter: Medication Review   PCP : [name], MD  Allergies:   Allergies  Allergen Reactions  . Ciprofloxacin Rash and Shortness Of Breath  . Sulfa Antibiotics Hives  . Sulfasalazine Hives  . Cortisone Other (See Comments)    Red flush and anxiety  . Tramadol Other (See Comments)    headache  . Zonisamide Other (See Comments)    unk  . Erythromycin Rash  . Erythromycin Base Rash  . Levofloxacin Nausea Only  . Lisinopril     Other reaction(s): Cough (ALLERGY/intolerance)  . Prednisone Rash and Anxiety    Medications: Outpatient Encounter Medications as of 06/30/2020  Medication Sig  . amitriptyline (ELAVIL) 25 MG tablet Take 1 tablet (25 mg total) by mouth at bedtime.  Marland Kitchen atorvastatin (LIPITOR) 40 MG tablet Take 1 tablet (40 mg total) by mouth daily.  . busPIRone (BUSPAR) 10 MG tablet Take 1 tablet (10 mg total) by mouth 3 (three) times daily.  . cyclobenzaprine (FLEXERIL) 5 MG tablet Take 5 mg by mouth 3 (three) times daily as needed for muscle spasms.  . DULoxetine (CYMBALTA) 60 MG capsule Take 1 capsule (60 mg total) by mouth 2 (two) times daily.  Marland Kitchen esomeprazole (NEXIUM) 40 MG capsule Take 40 mg by mouth 2 (two) times daily before a meal.   . HYDROcodone-acetaminophen (NORCO) 5-325 MG tablet Take 1 tablet by mouth every 6 (six) hours as needed for moderate pain.  . hydrOXYzine (ATARAX/VISTARIL) 25 MG tablet Take 1 tablet (25 mg total) by mouth at bedtime as needed.  Marland Kitchen levothyroxine (SYNTHROID) 137 MCG tablet 1 tab daily as directed except no dose on Sunday  . losartan (COZAAR) 100 MG tablet Take 1 tablet (100 mg total) by mouth daily.  . Magnesium 400 MG CAPS Take 400 mg by mouth daily. Reported on 02/13/2016  . meloxicam (MOBIC) 15 MG tablet TAKE 1 TABLET BY MOUTH EVERY DAY AS NEEDED FOR PAIN  . Methen-Hyosc-Meth Blue-Na Phos (UROGESIC-BLUE) 81.6 MG  TABS Take 1 tablet (81.6 mg total) by mouth 4 (four) times daily as needed.  . metoprolol succinate (TOPROL-XL) 100 MG 24 hr tablet Take 1 tablet (100 mg total) by mouth daily. Take with or immediately following a meal.  . metoprolol succinate (TOPROL-XL) 50 MG 24 hr tablet Take 1 tablet (50 mg total) by mouth daily. Take with or immediately following a meal.  . Misc Natural Products (FIBER 7 PO) Take by mouth.  . nitrofurantoin (MACRODANTIN) 50 MG capsule Take 50 mg by mouth daily.   . ondansetron (ZOFRAN) 4 MG tablet Take 4 mg by mouth every 8 (eight) hours as needed for nausea or vomiting.  . rizatriptan (MAXALT) 10 MG tablet Take 10 mg by mouth as needed for migraine. May repeat in 2 hours if needed  . topiramate (TOPAMAX) 100 MG tablet Take 1 tablet (100 mg total) by mouth 2 (two) times daily.   No facility-administered encounter medications on file as of 06/30/2020.    Current Diagnosis: Patient Active Problem List   Diagnosis Date Noted  . Vitamin D deficiency 11/18/2019  . Headache 07/25/2019  . Educated about COVID-19 virus infection 05/23/2019  . Chronic low back pain with left-sided sciatica 05/17/2018  . Urinary frequency 05/17/2018  . Fatigue 05/14/2018  . Interstitial cystitis 05/14/2018  . 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 Shoulder pain 06/03/2017  . Preventative health care  02/03/2017  . Depression with anxiety 06/29/2016  . Thyroiditis, subacute 06/04/2016  . UTI (urinary tract infection) 02/11/2016  . Cervical cancer screening 09/07/2015  . Cough 08/13/2015  . Insomnia 08/13/2015  . Acute stress reaction 04/25/2015  . Fibromyalgia   . Lesion of lung   . Allergy   . GERD (gastroesophageal reflux disease)   . Hyperlipidemia   . Hypertension   . Migraine   . Hyperplastic colon polyp   . Atrial fibrillation ([place])   . Hx of blood clots   . Obesity   . History of chicken pox     Goals Addressed   None    Reviewed chart for medication changes ahead of  medication coordination call.  No OVs, Consults, or hospital visits since last care coordination call  No medication changes indicated.  BP Readings from Last 3 Encounters:  03/08/20 127/73  02/22/20 118/77  02/17/20 (!) 142/80    Lab Results  Component Value Date   HGBA1C 5.7 (H) 01/24/2020     Patient obtains medications through Adherence Packaging  90 Days   This is the patients first adherence delivery   Patient is due for next adherence delivery on: 07-03-2020. Called patient and reviewed medications and coordinated delivery.  This delivery to include: Buspirone 10 mg; one tab at Breakfast, one at Lunch, one at Bedtime Amitriptyline 25 mg; one tab at Bedtime Hydroxyzine Hcl 25 mg; two tabs at Bedtime Losartan 100 mg; one tab at Breakfast Levothyroxine 137 mcg; one tab before Breakfast EXCEPT SUNDAY Duloxetine 60 mg; two tabs at Breakfast Meloxicam 15 mg; one tab at Breakfast Atorvastatin 40 mg; one tab at Bedtime Metoprolol Succinate 50 mg; one tab with Breakfast Metoprolol Succinate 100 mg; one tab at Breakfast Topiramate 100 mg; one tab at Breakfast, one at Bedtime Cyclobenzaprine 5 mg PRN (vial)   Patient declined the following medications: Hydrocodone 5 mg PRN (vial) Rizatriptan 10 mg; PRN (vial)  Urogesi-Blue tab; take four times a day PRN Esomeprazole patient gets from [place]  Patient does not need refills at this time  Confirmed delivery date of 07-03-2020, advised patient that pharmacy will contact them the morning of delivery.   Follow-Up:  Coordination of Enhanced Pharmacy Services and Pharmacist Review   Fanny Skates, [place] Pharmacist Assistant [phone]  Called patient to confirm she only take hydroxyzine 2 tabs daily at bedtime, she does not take levothyroxine on Sundays, and she gets esomeprazole from [place], so no need for rx or packaging. Reviewed by: De Blanch, PharmD Clinical Pharmacist [facility] [phone]

## 2020-07-03 DIAGNOSIS — M160 Bilateral primary osteoarthritis of hip: Secondary | ICD-10-CM | POA: Diagnosis not present

## 2020-07-03 DIAGNOSIS — M1712 Unilateral primary osteoarthritis, left knee: Secondary | ICD-10-CM | POA: Diagnosis not present

## 2020-07-03 DIAGNOSIS — M7062 Trochanteric bursitis, left hip: Secondary | ICD-10-CM | POA: Diagnosis not present

## 2020-07-03 DIAGNOSIS — M25562 Pain in left knee: Secondary | ICD-10-CM | POA: Diagnosis not present

## 2020-07-03 DIAGNOSIS — M25552 Pain in left hip: Secondary | ICD-10-CM | POA: Diagnosis not present

## 2020-07-13 ENCOUNTER — Ambulatory Visit: Payer: PPO

## 2020-07-13 DIAGNOSIS — E7849 Other hyperlipidemia: Secondary | ICD-10-CM

## 2020-07-13 DIAGNOSIS — F418 Other specified anxiety disorders: Secondary | ICD-10-CM

## 2020-07-13 DIAGNOSIS — I10 Essential (primary) hypertension: Secondary | ICD-10-CM

## 2020-07-13 NOTE — Chronic Care Management (AMB) (Signed)
Chronic Care Management Pharmacy  Name: Alexandra Patterson  MRN: [id number] DOB: 1956/03/08   Chief Complaint/ HPI  [name],  64 y.o. , female presents for their Follow-Up CCM visit with the clinical pharmacist via telephone due to COVID-19 Pandemic.  PCP : [name], MD Patient Care Team: [name], MD as PCP - General (Family Medicine) Veneda Melter, MD as Referring Physician (Cardiology) Dudley Major, MD as Referring Physician (Internal Medicine) Lunette Stands Glori Bickers, MD as Referring Physician (Cardiothoracic Surgery) Kerin Perna., MD as Referring Physician (Neurology) Keyondra Lagrand, Melvenia Beam, [facility] as Pharmacist (Pharmacist)  Their chronic conditions include: Atrial Fibrillation, Hypertension, Hyperlipidemia, Depression/Anxiety, Hypothyroidism, Chronic Pain, Migraine, GERD, Interstitial Cystitis   Office Visits: None since last CCM visit on 04/18/20.   Consult Visit: 07/03/20: Ortho visit w/ Dr. Case - X-ray of help, pelvis, and thigh: Left hip trochanteric bursitis. Refer to PT. F/U in 4-6 weeks if pain does not improve.   Allergies  Allergen Reactions  . Ciprofloxacin Rash and Shortness Of Breath  . Sulfa Antibiotics Hives  . Sulfasalazine Hives  . Cortisone Other (See Comments)    Red flush and anxiety  . Tramadol Other (See Comments)    headache  . Zonisamide Other (See Comments)    unk  . Erythromycin Rash  . Erythromycin Base Rash  . Levofloxacin Nausea Only  . Lisinopril     Other reaction(s): Cough (ALLERGY/intolerance)  . Prednisone Rash and Anxiety    Medications: Outpatient Encounter Medications as of 07/13/2020  Medication Sig  . amitriptyline (ELAVIL) 25 MG tablet Take 1 tablet (25 mg total) by mouth at bedtime.  Marland Kitchen atorvastatin (LIPITOR) 40 MG tablet Take 1 tablet (40 mg total) by mouth daily.  . busPIRone (BUSPAR) 10 MG tablet Take 1 tablet (10 mg total) by mouth 3 (three) times daily.  . cyclobenzaprine (FLEXERIL) 5 MG tablet Take  5 mg by mouth 3 (three) times daily as needed for muscle spasms.  . DULoxetine (CYMBALTA) 60 MG capsule Take 1 capsule (60 mg total) by mouth 2 (two) times daily.  Marland Kitchen esomeprazole (NEXIUM) 40 MG capsule Take 40 mg by mouth 2 (two) times daily before a meal.   . hydrOXYzine (ATARAX/VISTARIL) 25 MG tablet Take 1 tablet (25 mg total) by mouth at bedtime as needed.  Marland Kitchen losartan (COZAAR) 100 MG tablet Take 1 tablet (100 mg total) by mouth daily.  . Magnesium 400 MG CAPS Take 400 mg by mouth daily. Reported on 02/13/2016  . meloxicam (MOBIC) 15 MG tablet TAKE 1 TABLET BY MOUTH EVERY Lon Klippel AS NEEDED FOR PAIN  . Methen-Hyosc-Meth Blue-Na Phos (UROGESIC-BLUE) 81.6 MG TABS Take 1 tablet (81.6 mg total) by mouth 4 (four) times daily as needed.  . metoprolol succinate (TOPROL-XL) 100 MG 24 hr tablet Take 1 tablet (100 mg total) by mouth daily. Take with or immediately following a meal.  . metoprolol succinate (TOPROL-XL) 50 MG 24 hr tablet Take 1 tablet (50 mg total) by mouth daily. Take with or immediately following a meal.  . Misc Natural Products (FIBER 7 PO) Take by mouth.  . nitrofurantoin (MACRODANTIN) 50 MG capsule Take 50 mg by mouth daily.   . ondansetron (ZOFRAN) 4 MG tablet Take 4 mg by mouth every 8 (eight) hours as needed for nausea or vomiting.  . rizatriptan (MAXALT) 10 MG tablet Take 10 mg by mouth as needed for migraine. May repeat in 2 hours if needed  . topiramate (TOPAMAX) 100 MG tablet Take 1  tablet (100 mg total) by mouth 2 (two) times daily.  . [DISCONTINUED] HYDROcodone-acetaminophen (NORCO) 5-325 MG tablet Take 1 tablet by mouth every 6 (six) hours as needed for moderate pain.  . [DISCONTINUED] levothyroxine (SYNTHROID) 137 MCG tablet 1 tab daily as directed except no dose on Sunday   No facility-administered encounter medications on file as of 07/13/2020.   SDOH Screenings   Alcohol Screen:   . Last Alcohol Screening Score (AUDIT): Not on file  Depression (PHQ2-9): Medium Risk  .  PHQ-2 Score: 12  Financial Resource Strain:   . Difficulty of Paying Living Expenses: Not on file  Food Insecurity:   . Worried About Charity fundraiser in the Last Year: Not on file  . Ran Out of Food in the Last Year: Not on file  Housing:   . Last Housing Risk Score: Not on file  Physical Activity:   . Days of Exercise per Week: Not on file  . Minutes of Exercise per Session: Not on file  Social Connections:   . Frequency of Communication with Friends and Family: Not on file  . Frequency of Social Gatherings with Friends and Family: Not on file  . Attends Religious Services: Not on file  . Active Member of Clubs or Organizations: Not on file  . Attends Archivist Meetings: Not on file  . Marital Status: Not on file  Stress:   . Feeling of Stress : Not on file  Tobacco Use: Low Risk   . Smoking Tobacco Use: Never Smoker  . Smokeless Tobacco Use: Never Used  Transportation Needs:   . Film/video editor (Medical): Not on file  . Lack of Transportation (Non-Medical): Not on file     Current Diagnosis/Assessment:    Goals Addressed            This Visit's Progress   . Chronic Care Management Pharmacy Care Plan       CARE PLAN ENTRY (see longitudinal plan of care for additional care plan information)  Current Barriers:  . Chronic Disease Management support, education, and care coordination needs related to Atrial Fibrillation, Hypertension, Hyperlipidemia, Depression/Anxiety, Hypothyroidism, Chronic Pain, Migraine, GERD, Interstitial Cystitis    Hypertension BP Readings from Last 3 Encounters:  07/27/20 124/74  03/08/20 127/73  02/22/20 118/77   . Pharmacist Clinical Goal(s): o Over the next 90 days, patient will work with PharmD and providers to maintain BP goal <140/90 . Current regimen:  . Losartan 100 mg daily  . Metoprolol Succinate 50 mg daily  . Metoprolol Succinate 100 mg daily  . Patient self care activities - Over the next 90 days,  patient will: o Maintain hypertension medication regimen.  o Ensure daily salt intake < 2300 mg/Dallys Nowakowski  Hyperlipidemia Lab Results  Component Value Date/Time   LDLCALC 95 07/27/2020 11:46 AM   LDLDIRECT 103.0 06/03/2017 01:53 PM   . Pharmacist Clinical Goal(s): o Over the next 90 days, patient will work with PharmD and providers to achieve LDL goal < 100 . Current regimen:  o Atorvastatin 40 mg daily  . Patient self care activities - Over the next 90 days, patient will: o Maintain cholesterol medication regimen.   Health Maintenance  . Pharmacist Clinical Goal(s) o Over the next 90 days, patient will work with PharmD and providers to complete health maintenance screenings/vaccinations . Interventions: o Complete Pneumovax 23 at age 34 . Patient self care activities - Over the next 90 days, patient will: o Complete Pneumovax 23 vaccine  Medication  management . Pharmacist Clinical Goal(s): o Over the next 90 days, patient will work with PharmD and providers to maintain optimal medication adherence . Current [facility]: [facility] . Interventions o Comprehensive medication review performed. o Utilize [facility] pharmacy for medication synchronization, packaging and delivery . Patient self care activities - Over the next 90 days, patient will: o Focus on medication adherence by filling and taking medications appropriately  o Take medications as prescribed o Report any questions or concerns to PharmD and/or provider(s)  Please see past updates related to this goal by clicking on the "Past Updates" button in the selected goal       Social:  Married for 45 years. Lives with her husband.  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  She needs time alone to recharge,  Two children. Son and Daughter [name] works at  [employer]). Two grandsons. Mother is still living at age 64. She is having to do more things for her. She is concerned about her mom. She thinks her mom might have heart failure. She knows her mom doesn't want to go to the nursing home.   Update 07/13/20 Eval for PT starts on 07/24/20. Thinks she might do it twice daily. Unaware of the duration.  Hypertension   BP goal is:  <140/90  Office blood pressures are  BP Readings from Last 3 Encounters:  07/27/20 124/74  03/08/20 127/73  02/22/20 118/77   CMP Latest Ref Rng & Units 07/27/2020 01/27/2020 01/24/2020  Glucose 65 - 99 mg/dL 97 96 86  BUN 7 - 25 mg/dL 29(H) 20 13  Creatinine 0.50 - 0.99 mg/dL 0.99 1.00 0.81  Sodium 135 - 146 mmol/L 139 133(L) 127(L)  Potassium 3.5 - 5.3 mmol/L 4.9 4.7 4.4  Chloride 98 - 110 mmol/L 107 100 94(L)  CO2 20 - 32 mmol/L '22 26 23  ' Calcium 8.6 - 10.4 mg/dL 9.3 9.1 9.0  Total Protein 6.1 - 8.1 g/dL 6.1 - 6.3  Total Bilirubin 0.2 - 1.2 mg/dL 0.5 - 0.4  Alkaline Phos 39 - 117 IU/L - - 79  AST 10 - 35 U/L 15 - 19  ALT 6 - 29 U/L 11 - 18   Patient checks BP at home infrequently Patient home BP readings are ranging: Unable to assess  Patient has failed these meds in the past: Irbesartan, lisinopril Patient is currently controlled on the following medications:  . Losartan 100 mg daily  . Metoprolol Succinate 50 mg daily  . Metoprolol Succinate 100 mg daily   Update 07/13/20 Has not been checking.   Plan -Continue current medications   Hyperlipidemia   LDL goal <100  Lipid Panel     Component Value Date/Time   CHOL 189 07/27/2020 1146   TRIG 91 07/27/2020 1146   HDL 75 07/27/2020 1146   LDLCALC 95 07/27/2020 1146   LDLDIRECT 103.0 06/03/2017 1353    Hepatic Function Latest Ref Rng & Units 07/27/2020 01/24/2020 11/23/2019  Total Protein 6.1 - 8.1 g/dL 6.1 6.3 6.2  Albumin 3.8 - 4.8 g/dL - 4.4 4.3  AST 10 - 35 U/L '15 19 15  ' ALT 6 - 29 U/L '11 18 19  ' Alk Phosphatase 39 - 117 IU/L - 79 56   Total Bilirubin 0.2 - 1.2 mg/dL 0.5 0.4 0.5     The 10-year ASCVD risk score Mikey Bussing DC Brooke Bonito., et al.,  2013) is: 6%   Values used to calculate the score:     Age: 35 years     Sex: Female     Is Non-Hispanic African American: No     Diabetic: No     Tobacco smoker: No     Systolic Blood Pressure: 852 mmHg     Is BP treated: Yes     HDL Cholesterol: 75 mg/dL     Total Cholesterol: 189 mg/dL   Patient has failed these meds in past: Simvastatin 40 mg  Patient is currently uncontrolled on the following medications:  . Atorvastatin 40 mg daily   Update 07/13/20 Would benefit from updated lipid panel  Plan -Continue current medications   Depression / Anxiety   PHQ9 Score:  PHQ9 SCORE ONLY 07/13/2020 01/24/2020 01/14/2019  PHQ-9 Total Score '12 9 7   ' GAD7 Score: No flowsheet data found.  Patient has failed these meds in past: venlafaxine, olanzapine (listed in D/C meds. No apparent reason for D/C) Patient is currently controlled on the following medications:  . Buspirone 10 mg TID (usually takes twice daily) . Duloxetine 60 mg #2 daily  Update 07/13/20 PHQ9: 12 Sometimes wonders if something else to go with the duloxetine, but does not want to take another medication.   GAD7: 6 Feels buspirone works really well.  Plan -Continue current medications     Misc / OTC    . Magnesium complex 586m daily (takes it to replace magnesium and for constipation) . Urogesic blue 81.6 QID PRN (per Dr. EAmalia Hailey . Nitrofurantoin 50 mg daily (per Dr. EAmalia Hailey . Ondansetron 4 mg q8hr PRN   . Probiotic . Fiber . Vitamin D3 5000 units   Vaccines   Reviewed and discussed patient's vaccination history.    Immunization History  Administered Date(s) Administered  . Fluad Quad(high Dose 65+) 07/27/2020  . Influenza,inj,Quad PF,6+ Mos 07/29/2019  . PFIZER SARS-COV-2 Vaccination 12/23/2019, 01/17/2020  . Pneumococcal Polysaccharide-23 07/27/2020  . Tdap 05/26/2015  . Zoster 05/26/2015   . Zoster Recombinat (Shingrix) 05/29/2018, 08/01/2018    Plan  Recommended patient receive Pneumovax 23 at age [age]vaccine in pharmacy.    [facility] Reviewed patient's [facility] medication and Epic medication profile assuring there are no discrepancies or gaps in therapy. Confirmed all fill dates appropriate and verified with patient that there is a sufficient quantity of all prescribed medications at home. Informed patient to call me any time if needing medications before scheduled deliveries. The anticipated medication sync date is 08/02/20.  KDe Blanch PharmD Clinical Pharmacist [phone]

## 2020-07-21 ENCOUNTER — Telehealth: Payer: Self-pay | Admitting: Pharmacist

## 2020-07-21 ENCOUNTER — Telehealth: Payer: Self-pay | Admitting: *Deleted

## 2020-07-21 NOTE — Progress Notes (Signed)
A user error has taken place: encounter opened in error, closed for administrative reasons.

## 2020-07-21 NOTE — Progress Notes (Addendum)
Chronic Care Management Pharmacy Assistant   Name: Alexandra Patterson  MRN: [id number] DOB: June 23, 1956  Reason for Encounter: Medication Review  Patient Questions:  1.  Have you seen any other providers since your last visit? No  2.  Any changes in your medicines or health? No   PCP : [name], MD  Allergies:   Allergies  Allergen Reactions  . Ciprofloxacin Rash and Shortness Of Breath  . Sulfa Antibiotics Hives  . Sulfasalazine Hives  . Cortisone Other (See Comments)    Red flush and anxiety  . Tramadol Other (See Comments)    headache  . Zonisamide Other (See Comments)    unk  . Erythromycin Rash  . Erythromycin Base Rash  . Levofloxacin Nausea Only  . Lisinopril     Other reaction(s): Cough (ALLERGY/intolerance)  . Prednisone Rash and Anxiety    Medications: Outpatient Encounter Medications as of 07/21/2020  Medication Sig  . amitriptyline (ELAVIL) 25 MG tablet Take 1 tablet (25 mg total) by mouth at bedtime.  Marland Kitchen atorvastatin (LIPITOR) 40 MG tablet Take 1 tablet (40 mg total) by mouth daily.  . busPIRone (BUSPAR) 10 MG tablet Take 1 tablet (10 mg total) by mouth 3 (three) times daily.  . cyclobenzaprine (FLEXERIL) 5 MG tablet Take 5 mg by mouth 3 (three) times daily as needed for muscle spasms.  . DULoxetine (CYMBALTA) 60 MG capsule Take 1 capsule (60 mg total) by mouth 2 (two) times daily.  Marland Kitchen esomeprazole (NEXIUM) 40 MG capsule Take 40 mg by mouth 2 (two) times daily before a meal.   . HYDROcodone-acetaminophen (NORCO) 5-325 MG tablet Take 1 tablet by mouth every 6 (six) hours as needed for moderate pain.  . hydrOXYzine (ATARAX/VISTARIL) 25 MG tablet Take 1 tablet (25 mg total) by mouth at bedtime as needed.  Marland Kitchen levothyroxine (SYNTHROID) 137 MCG tablet 1 tab daily as directed except no dose on Sunday  . losartan (COZAAR) 100 MG tablet Take 1 tablet (100 mg total) by mouth daily.  . Magnesium 400 MG CAPS Take 400 mg by mouth daily. Reported on 02/13/2016  .  meloxicam (MOBIC) 15 MG tablet TAKE 1 TABLET BY MOUTH EVERY DAY AS NEEDED FOR PAIN  . Methen-Hyosc-Meth Blue-Na Phos (UROGESIC-BLUE) 81.6 MG TABS Take 1 tablet (81.6 mg total) by mouth 4 (four) times daily as needed.  . metoprolol succinate (TOPROL-XL) 100 MG 24 hr tablet Take 1 tablet (100 mg total) by mouth daily. Take with or immediately following a meal.  . metoprolol succinate (TOPROL-XL) 50 MG 24 hr tablet Take 1 tablet (50 mg total) by mouth daily. Take with or immediately following a meal.  . Misc Natural Products (FIBER 7 PO) Take by mouth.  . nitrofurantoin (MACRODANTIN) 50 MG capsule Take 50 mg by mouth daily.   . ondansetron (ZOFRAN) 4 MG tablet Take 4 mg by mouth every 8 (eight) hours as needed for nausea or vomiting.  . rizatriptan (MAXALT) 10 MG tablet Take 10 mg by mouth as needed for migraine. May repeat in 2 hours if needed  . topiramate (TOPAMAX) 100 MG tablet Take 1 tablet (100 mg total) by mouth 2 (two) times daily.   No facility-administered encounter medications on file as of 07/21/2020.    Current Diagnosis: Patient Active Problem List   Diagnosis Date Noted  . Vitamin D deficiency 11/18/2019  . Headache 07/25/2019  . Educated about COVID-19 virus infection 05/23/2019  . Chronic low back pain with left-sided sciatica 05/17/2018  .  Urinary frequency 05/17/2018  . Fatigue 05/14/2018  . Interstitial cystitis 05/14/2018  . 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 Shoulder pain 06/03/2017  . Preventative health care 02/03/2017  . Depression with anxiety 06/29/2016  . Thyroiditis, subacute 06/04/2016  . UTI (urinary tract infection) 02/11/2016  . Cervical cancer screening 09/07/2015  . Cough 08/13/2015  . Insomnia 08/13/2015  . Acute stress reaction 04/25/2015  . Fibromyalgia   . Lesion of lung   . Allergy   . GERD (gastroesophageal reflux disease)   . Hyperlipidemia   . Hypertension   . Migraine   . Hyperplastic colon polyp   . Atrial fibrillation ([place])   . Hx of  blood clots   . Obesity   . History of chicken pox     Goals Addressed   None    Reviewed chart for medication changes ahead of medication coordination call.  No OVs, Consults, or hospital visits since last care coordination call/Pharmacist visit.   No medication changes indicated OR if recent visit, treatment plan here.  BP Readings from Last 3 Encounters:  03/08/20 127/73  02/22/20 118/77  02/17/20 (!) 142/80    Lab Results  Component Value Date   HGBA1C 5.7 (H) 01/24/2020     Patient obtains medications through Adherence Packaging  90 Days   Last adherence delivery included:  Buspirone 10 mg; one tab at Breakfast, one at Lunch, one at Bedtime Amitriptyline 25 mg; one tab at Bedtime Hydroxyzine Hcl 25 mg; two tabs at Bedtime Losartan 100 mg; one tab at Breakfast Levothyroxine 137 mcg; one tab before Breakfast EXCEPT SUNDAY Duloxetine 60 mg; two tabs at Breakfast Meloxicam 15 mg; one tab at Breakfast Atorvastatin 40 mg; one tab at Bedtime Metoprolol Succinate 50 mg; one tab with Breakfast Metoprolol Succinate 100 mg; one tab at Breakfast Topiramate 100 mg; one tab at Breakfast, one at Bedtime Cyclobenzaprine 5 mg PRN (vial)  Patient declined the following medications last month: Hydrocodone 5 mg PRN (vial) Rizatriptan 10 mg; PRN (vial)  Urogesi-Blue tab; take four times a day PRN Esomepraz[place]  Patient is due for next adherence delivery on: 07-24-2020 Called patient and reviewed medications and coordinated delivery.  This delivery to include: Hydrocodone 5 mg PRN (vial)   Patient declined the following medications due to 90 day supply delivered on 07-03-20: Buspirone 10 mg; one tab at Breakfast, one at Lunch, one at Bedtime Amitriptyline 25 mg; one tab at Bedtime Hydroxyzine Hcl 25 mg; two tabs at Bedtime Losartan 100 mg; one tab at Breakfast Levothyroxine 137 mcg; one tab before Breakfast EXCEPT SUNDAY Duloxetine 60 mg; two tabs at  Breakfast Meloxicam 15 mg; one tab at Breakfast Atorvastatin 40 mg; one tab at Bedtime Metoprolol Succinate 50 mg; one tab with Breakfast Metoprolol Succinate 100 mg; one tab at Breakfast Topiramate 100 mg; one tab at Breakfast, one at Bedtime Cyclobenzaprine 5 mg PRN (vial) Rizatriptan 10 mg; PRN (vial)  Urogesi-Blue tab; take four times a day PRN Esomeprazole patient gets from [facility]   Patient needs no refills.  Patient needs an acute fill on hydrocodone 5 mg vial  Confirmed delivery date of 07-24-2020, advised patient that pharmacy will contact them the morning of delivery.  Follow-Up:  Comptroller and Pharmacist Review   [place] Shannon, [place] [facility] [place] [phone]  Reviewed by: De Blanch, PharmD Clinical Pharmacist [place] [facility] [phone]

## 2020-07-21 NOTE — Telephone Encounter (Signed)
Hydrocodone  Last written: 06/20/20 Last ov: 02/17/20 Next ov: 07/27/20 Contract: 02/17/20 UDS: 02/21/20

## 2020-07-21 NOTE — Telephone Encounter (Signed)
-----   Message from [place] N Shannon sent at 07/21/2020 11:01 AM EDT ----- Regarding: medication refill Patient states she needs a refill on hydrocodone [facility]

## 2020-07-22 ENCOUNTER — Other Ambulatory Visit: Payer: Self-pay | Admitting: Family Medicine

## 2020-07-22 MED ORDER — HYDROCODONE-ACETAMINOPHEN 5-325 MG PO TABS: 1.0000 | ORAL_TABLET | Freq: Four times a day (QID) | ORAL | 0 refills | Status: DC | PRN

## 2020-07-22 NOTE — Telephone Encounter (Signed)
done

## 2020-07-24 DIAGNOSIS — R269 Unspecified abnormalities of gait and mobility: Secondary | ICD-10-CM | POA: Diagnosis not present

## 2020-07-24 DIAGNOSIS — Z7409 Other reduced mobility: Secondary | ICD-10-CM | POA: Diagnosis not present

## 2020-07-24 DIAGNOSIS — Z789 Other specified health status: Secondary | ICD-10-CM | POA: Diagnosis not present

## 2020-07-24 DIAGNOSIS — M6289 Other specified disorders of muscle: Secondary | ICD-10-CM | POA: Diagnosis not present

## 2020-07-24 DIAGNOSIS — M7062 Trochanteric bursitis, left hip: Secondary | ICD-10-CM | POA: Diagnosis not present

## 2020-07-24 DIAGNOSIS — M25559 Pain in unspecified hip: Secondary | ICD-10-CM | POA: Diagnosis not present

## 2020-07-27 ENCOUNTER — Encounter: Payer: Self-pay | Admitting: Family Medicine

## 2020-07-27 ENCOUNTER — Ambulatory Visit (INDEPENDENT_AMBULATORY_CARE_PROVIDER_SITE_OTHER): Payer: PPO | Admitting: Family Medicine

## 2020-07-27 ENCOUNTER — Other Ambulatory Visit: Payer: Self-pay

## 2020-07-27 VITALS — BP 124/74 | HR 88 | Temp 98.6°F | Resp 18 | Wt 251.2 lb

## 2020-07-27 DIAGNOSIS — E785 Hyperlipidemia, unspecified: Secondary | ICD-10-CM | POA: Diagnosis not present

## 2020-07-27 DIAGNOSIS — K635 Polyp of colon: Secondary | ICD-10-CM | POA: Diagnosis not present

## 2020-07-27 DIAGNOSIS — Z23 Encounter for immunization: Secondary | ICD-10-CM | POA: Diagnosis not present

## 2020-07-27 DIAGNOSIS — R739 Hyperglycemia, unspecified: Secondary | ICD-10-CM | POA: Diagnosis not present

## 2020-07-27 DIAGNOSIS — Z1239 Encounter for other screening for malignant neoplasm of breast: Secondary | ICD-10-CM

## 2020-07-27 DIAGNOSIS — Z6836 Body mass index (BMI) 36.0-36.9, adult: Secondary | ICD-10-CM

## 2020-07-27 DIAGNOSIS — E559 Vitamin D deficiency, unspecified: Secondary | ICD-10-CM

## 2020-07-27 DIAGNOSIS — Z0000 Encounter for general adult medical examination without abnormal findings: Secondary | ICD-10-CM | POA: Diagnosis not present

## 2020-07-27 DIAGNOSIS — I10 Essential (primary) hypertension: Secondary | ICD-10-CM | POA: Diagnosis not present

## 2020-07-27 NOTE — Assessment & Plan Note (Signed)
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 She is not eating a healthy diet or exercising. She is encouraged to consider the MIND diet and to consider counseling to manage.

## 2020-07-27 NOTE — Assessment & Plan Note (Signed)
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 She is not eating a healthy diet or exercising. She is encouraged to consider the MIND diet and to consider counseling to manage.

## 2020-07-27 NOTE — Patient Instructions (Addendum)
MIND diet is good diet   Wegovy or Saxenda for weight loss  Preventive Care 65 Years and Older, Female Preventive care refers to lifestyle choices and visits with your health care provider that can promote health and wellness. This includes:  A yearly physical exam. This is also called an annual well check.  Regular dental and eye exams.  Immunizations.  Screening for certain conditions.  Healthy lifestyle choices, such as diet and meds  exercise. What can I expect for my preventive care visit? Physical exam Your health care provider will check:  Height and weight. These may be used to calculate body mass index (BMI), which is a measurement that tells if you are at a healthy weight.  Heart rate and blood pressure.  Your skin for abnormal spots. Counseling Your health care provider may ask you questions about:  Alcohol, tobacco, and drug use.  Emotional well-being.  Home and relationship well-being.  Sexual activity.  Eating habits.  History of falls.  Memory and ability to understand (cognition).  Work and work Statistician.  Pregnancy and menstrual history. What immunizations do I need?  Influenza (flu) vaccine  This is recommended every year. Tetanus, diphtheria, and pertussis (Tdap) vaccine  You may need a Td booster every 10 years. Varicella (chickenpox) vaccine  You may need this vaccine if you have not already been vaccinated. Zoster (shingles) vaccine  You may need this after age 74. Pneumococcal conjugate (PCV13) vaccine  One dose is recommended after age 7. Pneumococcal polysaccharide (PPSV23) vaccine  One dose is recommended after age 8. Measles, mumps, and rubella (MMR) vaccine  You may need at least one dose of MMR if you were born in 1957 or later. You may also need a second dose. Meningococcal conjugate (MenACWY) vaccine  You may need this if you have certain conditions. Hepatitis A vaccine  You may need this if you have certain  conditions or if you travel or work in places where you may be exposed to hepatitis A. Hepatitis B vaccine  You may need this if you have certain conditions or if you travel or work in places where you may be exposed to hepatitis B. Haemophilus influenzae type b (Hib) vaccine  You may need this if you have certain conditions. You may receive vaccines as individual doses or as more than one vaccine together in one shot (combination vaccines). Talk with your health care provider about the risks and benefits of combination vaccines. What tests do I need? Blood tests  Lipid and cholesterol levels. These may be checked every 5 years, or more frequently depending on your overall health.  Hepatitis C test.  Hepatitis B test. Screening  Lung cancer screening. You may have this screening every year starting at age 46 if you have a 30-pack-year history of smoking and currently smoke or have quit within the past 15 years.  Colorectal cancer screening. All adults should have this screening starting at age 55 and continuing until age 43. Your health care provider may recommend screening at age 24 if you are at increased risk. You will have tests every 1-10 years, depending on your results and the type of screening test.  Diabetes screening. This is done by checking your blood sugar (glucose) after you have not eaten for a while (fasting). You may have this done every 1-3 years.  Mammogram. This may be done every 1-2 years. Talk with your health care provider about how often you should have regular mammograms.  BRCA-related cancer screening. This  may be done if you have a family history of breast, ovarian, tubal, or peritoneal cancers. Other tests  Sexually transmitted disease (STD) testing.  Bone density scan. This is done to screen for osteoporosis. You may have this done starting at age 63. Follow these instructions at home: Eating and drinking  Eat a diet that includes fresh fruits and  vegetables, whole grains, lean protein, and low-fat dairy products. Limit your intake of foods with high amounts of sugar, saturated fats, and salt.  Take vitamin and mineral supplements as recommended by your health care provider.  Do not drink alcohol if your health care provider tells you not to drink.  If you drink alcohol: ? Limit how much you have to 0-1 drink a day. ? Be aware of how much alcohol is in your drink. In the [place] one drink equals one 12 oz bottle of beer (355 mL), one 5 oz glass of wine (148 mL), or one 1 oz glass of hard liquor (44 mL). Lifestyle  Take daily care of your teeth and gums.  Stay active. Exercise for at least 30 minutes on 5 or more days each week.  Do not use any products that contain nicotine or tobacco, such as cigarettes, e-cigarettes, and chewing tobacco. If you need help quitting, ask your health care provider.  If you are sexually active, practice safe sex. Use a condom or other form of protection in order to prevent STIs (sexually transmitted infections).  Talk with your health care provider about taking a low-dose aspirin or statin. What's next?  Go to your health care provider once a year for a well check visit.  Ask your health care provider how often you should have your eyes and teeth checked.  Stay up to date on all vaccines. This information is not intended to replace advice given to you by your health care provider. Make sure you discuss any questions you have with your health care provider. Document Revised: 09/10/2018 Document Reviewed: 09/10/2018 Elsevier Patient Education  2020 [employer].

## 2020-07-27 NOTE — Progress Notes (Signed)
Subjective:    Patient ID: Alexandra Patterson, female    DOB: August 25, 1956, 64 y.o.   MRN: [id number]  Chief Complaint  Patient presents with  . Annual Exam    HPI Patient is in today for annual preventative exam and follow up on chronic medical concerns. No recent febrile illness or hospitalizations. She is frustrated with her weight gain. She has been the cre provider for her ailing 27 year old mother and her daughter this year and has thus had very little time to take care of herself. She has not been exercising or maintaining a heart healthy diet. Denies CP/palp/SOB/HA/congestion/fevers/GI or GU c/o. Taking meds as prescribed  Past Medical History:  Diagnosis Date  . Allergy   . Anxiety   . Atrial fibrillation ([place])   . Back pain   . Bilateral swelling of feet   . Bursitis   . Cervical cancer screening 09/07/2015   Menarche at 11 Irregular and heavy and painful flow secondary to fibroids No history of abnormal pap in past, last pap roughly 2003 G2P2, s/p 2 SVD history of abnormal MGM, 1 abnl bx right breast benign, normal otherwise Noconcerns today TAH b/l SPO for fibroids and migrainesand breast bx on right gyn surgeries  . Constipation   . Cough 08/13/2015  . Depression   . Depression with anxiety 06/29/2016  . Fainting    once to due heart out of rythm  . Fibromyalgia   . Frequent headaches   . GERD (gastroesophageal reflux disease)   . 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 Heartburn   . History of blood clots   . History of chicken pox   . Hx of blood clots    dvt  . Hyperlipidemia   . Hyperplastic colon polyp   . Hypertension   . Hypothyroid   . Insomnia 08/13/2015  . Internal hemorrhoids   . Interstitial cystitis   . Joint pain   . Lesion of lung    xray in 2014 thought to be benign seen at [facility]ology  . Measles    h/o  . Migraine   . Obesity   . Osteoarthritis   . Preventative health care 02/03/2017  . Raynaud's disease   . S/P AVR (aortic valve  replacement) 03/19/2015  . Shoulder pain 06/03/2017  . UTI (lower urinary tract infection)   . UTI (urinary tract infection) 02/11/2016    Past Surgical History:  Procedure Laterality Date  . ABDOMINAL HYSTERECTOMY     TAH SPO  . AORTIC VALVE REPLACEMENT  2014   with [facility]  . ATRIAL ABLATION SURGERY    . BLADDER SUSPENSION    . BREAST BIOPSY Right    Needle Biopsy  . DILATION AND CURETTAGE OF UTERUS     x 3  . loop heart    . SHOULDER SURGERY Left    arthroscopy for spurs  . TONSILLECTOMY    . TUBAL LIGATION      Family History  Problem Relation Age of Onset  . Colon polyps Father   . Alzheimer's disease Father   . Alcohol abuse Father   . Cancer Father        colon cancer  . Arthritis Mother        s/p TKR  . Neuropathy Mother   . Hyperlipidemia Mother   . Other Mother        familial mediterranean fever  . Thyroid disease Mother   . Hypertension Mother   . Obesity Mother   .  Heart disease Mother   . Cancer Brother        prostate cancer  . Heart disease Brother        cardiomegaly  . Other Brother        familial mediterranean fever  . Asthma Maternal Grandmother   . Congestive Heart Failure Maternal Grandmother   . Heart disease Maternal Grandmother        chf  . Stroke Maternal Grandfather   . Diabetes Maternal Grandfather   . Heart disease Maternal Grandfather        hardening of the arteries  . Stroke Paternal Grandfather   . Atrial fibrillation Paternal Grandfather   . Alcohol abuse Paternal Grandfather   . Heart disease Paternal Grandfather        afib  . Interstitial cystitis Daughter   . Arthritis Son   . Alcohol abuse Son        in remission  . Mental illness Son        depression  . Other Son        interstitial cystitis  . Colon cancer Other        parent  . Arthritis Paternal Uncle     Social History   Socioeconomic History  . Marital status: Married    Spouse name: Not on file  . Number of children: 2  . Years of education: Not  on file  . Highest education level: Not on file  Occupational History  . Occupation: Best boy: [employer]  Tobacco Use  . Smoking status: Never Smoker  . Smokeless tobacco: Never Used  Substance and Sexual Activity  . Alcohol use: No  . Drug use: No  . Sexual activity: Yes    Comment: lives with husband and adult son, no major dietary restrictions, full diability  Other Topics Concern  . Not on file  Social History Narrative  . Not on file   Social Determinants of Health   Financial Resource Strain:   . Difficulty of Paying Living Expenses: Not on file  Food Insecurity:   . Worried About Charity fundraiser in the Last Year: Not on file  . Ran Out of Food in the Last Year: Not on file  Transportation Needs:   . Lack of Transportation (Medical): Not on file  . Lack of Transportation (Non-Medical): Not on file  Physical Activity:   . Days of Exercise per Week: Not on file  . Minutes of Exercise per Session: Not on file  Stress:   . Feeling of Stress : Not on file  Social Connections:   . Frequency of Communication with Friends and Family: Not on file  . Frequency of Social Gatherings with Friends and Family: Not on file  . Attends Religious Services: Not on file  . Active Member of Clubs or Organizations: Not on file  . Attends Archivist Meetings: Not on file  . Marital Status: Not on file  Intimate Partner Violence:   . Fear of Current or Ex-Partner: Not on file  . Emotionally Abused: Not on file  . Physically Abused: Not on file  . Sexually Abused: Not on file    Outpatient Medications Prior to Visit  Medication Sig Dispense Refill  . amitriptyline (ELAVIL) 25 MG tablet Take 1 tablet (25 mg total) by mouth at bedtime.    Marland Kitchen atorvastatin (LIPITOR) 40 MG tablet Take 1 tablet (40 mg total) by mouth daily. 90 tablet 1  .  busPIRone (BUSPAR) 10 MG tablet Take 1 tablet (10 mg total) by mouth 3 (three) times daily. 270 tablet 1  .  cyclobenzaprine (FLEXERIL) 5 MG tablet Take 5 mg by mouth 3 (three) times daily as needed for muscle spasms.    . DULoxetine (CYMBALTA) 60 MG capsule Take 1 capsule (60 mg total) by mouth 2 (two) times daily. 180 capsule 1  . esomeprazole (NEXIUM) 40 MG capsule Take 40 mg by mouth 2 (two) times daily before a meal.     . HYDROcodone-acetaminophen (NORCO) 5-325 MG tablet Take 1 tablet by mouth every 6 (six) hours as needed for moderate pain. 60 tablet 0  . hydrOXYzine (ATARAX/VISTARIL) 25 MG tablet Take 1 tablet (25 mg total) by mouth at bedtime as needed. 90 tablet 1  . levothyroxine (SYNTHROID) 137 MCG tablet 1 tab daily as directed except no dose on Sunday 90 tablet 1  . losartan (COZAAR) 100 MG tablet Take 1 tablet (100 mg total) by mouth daily. 90 tablet 1  . Magnesium 400 MG CAPS Take 400 mg by mouth daily. Reported on 02/13/2016    . meloxicam (MOBIC) 15 MG tablet TAKE 1 TABLET BY MOUTH EVERY DAY AS NEEDED FOR PAIN 90 tablet 1  . Methen-Hyosc-Meth Blue-Na Phos (UROGESIC-BLUE) 81.6 MG TABS Take 1 tablet (81.6 mg total) by mouth 4 (four) times daily as needed.    . metoprolol succinate (TOPROL-XL) 100 MG 24 hr tablet Take 1 tablet (100 mg total) by mouth daily. Take with or immediately following a meal. 90 tablet 1  . metoprolol succinate (TOPROL-XL) 50 MG 24 hr tablet Take 1 tablet (50 mg total) by mouth daily. Take with or immediately following a meal. 90 tablet 1  . Misc Natural Products (FIBER 7 PO) Take by mouth.    . nitrofurantoin (MACRODANTIN) 50 MG capsule Take 50 mg by mouth daily.     . ondansetron (ZOFRAN) 4 MG tablet Take 4 mg by mouth every 8 (eight) hours as needed for nausea or vomiting.    . rizatriptan (MAXALT) 10 MG tablet Take 10 mg by mouth as needed for migraine. May repeat in 2 hours if needed    . topiramate (TOPAMAX) 100 MG tablet Take 1 tablet (100 mg total) by mouth 2 (two) times daily.     No facility-administered medications prior to visit.    Allergies  Allergen  Reactions  . Ciprofloxacin Rash and Shortness Of Breath  . Sulfa Antibiotics Hives  . Sulfasalazine Hives  . Cortisone Other (See Comments)    Red flush and anxiety  . Tramadol Other (See Comments)    headache  . Zonisamide Other (See Comments)    unk  . Erythromycin Rash  . Erythromycin Base Rash  . Levofloxacin Nausea Only  . Lisinopril     Other reaction(s): Cough (ALLERGY/intolerance)  . Prednisone Rash and Anxiety    Review of Systems  Constitutional: Positive for malaise/fatigue. Negative for chills and fever.  HENT: Negative for congestion and hearing loss.   Eyes: Negative for discharge.  Respiratory: Positive for shortness of breath. Negative for cough and sputum production.   Cardiovascular: Negative for chest pain, palpitations and leg swelling.  Gastrointestinal: Negative for abdominal pain, blood in stool, constipation, diarrhea, heartburn, nausea and vomiting.  Genitourinary: Negative for dysuria, frequency, hematuria and urgency.  Musculoskeletal: Positive for back pain and myalgias. Negative for falls.  Skin: Negative for rash.  Neurological: Negative for dizziness, sensory change, loss of consciousness, weakness and headaches.  Endo/Heme/Allergies: Negative  for environmental allergies. Does not bruise/bleed easily.  Psychiatric/Behavioral: Positive for depression. Negative for suicidal ideas. The patient is nervous/anxious. The patient does not have insomnia.        Objective:    Physical Exam Constitutional:      General: She is not in acute distress.    Appearance: She is obese. She is not ill-appearing or diaphoretic.  HENT:     Head: Normocephalic and atraumatic.     Right Ear: External ear normal.     Left Ear: External ear normal.     Nose: Nose normal.     Mouth/Throat:     Pharynx: No oropharyngeal exudate.  Eyes:     General: No scleral icterus.       Right eye: No discharge.        Left eye: No discharge.     Conjunctiva/sclera:  Conjunctivae normal.     Pupils: Pupils are equal, round, and reactive to light.  Neck:     Thyroid: No thyromegaly.  Cardiovascular:     Rate and Rhythm: Normal rate and regular rhythm.     Heart sounds: Normal heart sounds. No murmur heard.   Pulmonary:     Effort: Pulmonary effort is normal. No respiratory distress.     Breath sounds: Normal breath sounds. No wheezing or rales.  Abdominal:     General: Bowel sounds are normal. There is no distension.     Palpations: Abdomen is soft. There is no mass.     Tenderness: There is no abdominal tenderness.  Musculoskeletal:        General: No tenderness. Normal range of motion.     Cervical back: Normal range of motion and neck supple.  Lymphadenopathy:     Cervical: No cervical adenopathy.  Skin:    General: Skin is warm and dry.     Findings: No rash.  Neurological:     Mental Status: She is alert and oriented to person, place, and time.     Cranial Nerves: No cranial nerve deficit.     Coordination: Coordination normal.     Deep Tendon Reflexes: Reflexes are normal and symmetric. Reflexes normal.     BP 124/74 (BP Location: Right Arm)   Pulse 88   Temp 98.6 F (37 C) (Oral)   Resp 18   Wt 251 lb 3.2 oz (113.9 kg)   SpO2 97%   BMI 39.34 kg/m  Wt Readings from Last 3 Encounters:  07/27/20 251 lb 3.2 oz (113.9 kg)  03/08/20 235 lb (106.6 kg)  02/22/20 233 lb (105.7 kg)    Diabetic Foot Exam - Simple   No data filed     Lab Results  Component Value Date   WBC 5.6 07/27/2020   HGB 14.3 07/27/2020   HCT 42.4 07/27/2020   PLT 172 07/27/2020   GLUCOSE 96 01/27/2020   CHOL 196 11/23/2019   TRIG 87.0 11/23/2019   HDL 71.50 11/23/2019   LDLDIRECT 103.0 06/03/2017   LDLCALC 107 (H) 11/23/2019   ALT 18 01/24/2020   AST 19 01/24/2020   NA 133 (L) 01/27/2020   K 4.7 01/27/2020   CL 100 01/27/2020   CREATININE 1.00 01/27/2020   BUN 20 01/27/2020   CO2 26 01/27/2020   TSH 0.967 01/24/2020   HGBA1C 5.7 (H)  01/24/2020    Lab Results  Component Value Date   TSH 0.967 01/24/2020   Lab Results  Component Value Date   WBC 5.6 07/27/2020   HGB 14.3  07/27/2020   HCT 42.4 07/27/2020   MCV 91.4 07/27/2020   PLT 172 07/27/2020   Lab Results  Component Value Date   NA 133 (L) 01/27/2020   K 4.7 01/27/2020   CO2 26 01/27/2020   GLUCOSE 96 01/27/2020   BUN 20 01/27/2020   CREATININE 1.00 01/27/2020   BILITOT 0.4 01/24/2020   ALKPHOS 79 01/24/2020   AST 19 01/24/2020   ALT 18 01/24/2020   PROT 6.3 01/24/2020   ALBUMIN 4.4 01/24/2020   CALCIUM 9.1 01/27/2020   GFR 55.69 (L) 01/27/2020   Lab Results  Component Value Date   CHOL 196 11/23/2019   Lab Results  Component Value Date   HDL 71.50 11/23/2019   Lab Results  Component Value Date   LDLCALC 107 (H) 11/23/2019   Lab Results  Component Value Date   TRIG 87.0 11/23/2019   Lab Results  Component Value Date   CHOLHDL 3 11/23/2019   Lab Results  Component Value Date   HGBA1C 5.7 (H) 01/24/2020       Assessment & Plan:   Problem List Items Addressed This Visit    Hyperlipidemia    Encouraged heart healthy diet, increase exercise, avoid trans fats, consider a krill oil cap daily, tolerating Atorvastatin      Relevant Orders   Lipid panel   Hypertension    Well controlled, no changes to meds. Encouraged heart healthy diet such as the DASH diet and exercise as tolerated.       Relevant Orders   CBC (Completed)   Comprehensive metabolic panel   TSH   Hyperplastic colon polyp   Relevant Orders   Ambulatory referral to Gastroenterology   Obesity    Very frustrated with weight gain this year as she has been the caregiver for her 38 year old mother and her ailing daughter as well. She is not eating a healthy diet or exercising. She is encouraged to consider the MIND diet and to consider counseling to manage.      Preventative health care    Patient encouraged to maintain heart healthy diet, regular exercise,  adequate sleep. Consider daily probiotics. Take medications as prescribed. Labs ordered and reviewed. Pneumovax given today. Referred for colonoscopy and mgm ordered      Vitamin D deficiency   Relevant Orders   VITAMIN D 25 Hydroxy (Vit-D Deficiency, Fractures)    Other Visit Diagnoses    Influenza vaccine administered    -  Primary   Relevant Orders   Flu Vaccine QUAD High Dose(Fluad) (Completed)   Encounter for screening for malignant neoplasm of breast, unspecified screening modality       Relevant Orders   MM 3D SCREEN BREAST BILATERAL   Hyperglycemia       Relevant Orders   Hemoglobin A1c      I am having Burnett Corrente. Blane maintain her rizatriptan, Magnesium, esomeprazole, Misc Natural Products (FIBER 7 PO), Urogesic-Blue, amitriptyline, hydrOXYzine, topiramate, cyclobenzaprine, ondansetron, nitrofurantoin, atorvastatin, meloxicam, DULoxetine, losartan, levothyroxine, busPIRone, metoprolol succinate, metoprolol succinate, and HYDROcodone-acetaminophen.  No orders of the defined types were placed in this encounter.    Penni Homans, MD

## 2020-07-27 NOTE — Assessment & Plan Note (Signed)
Well controlled, no changes to meds. Encouraged heart healthy diet such as the DASH diet and exercise as tolerated.  °

## 2020-07-27 NOTE — Assessment & Plan Note (Addendum)
Patient encouraged to maintain heart healthy diet, regular exercise, adequate sleep. Consider daily probiotics. Take medications as prescribed. Labs ordered and reviewed. Pneumovax given today. Referred for colonoscopy and mgm ordered

## 2020-07-27 NOTE — Assessment & Plan Note (Signed)
Encouraged heart healthy diet, increase exercise, avoid trans fats, consider a krill oil cap daily, tolerating Atorvastatin 

## 2020-07-28 ENCOUNTER — Other Ambulatory Visit: Payer: Self-pay

## 2020-07-28 ENCOUNTER — Encounter: Payer: Self-pay | Admitting: Family Medicine

## 2020-07-28 ENCOUNTER — Telehealth: Payer: Self-pay | Admitting: Pharmacist

## 2020-07-28 DIAGNOSIS — E061 Subacute thyroiditis: Secondary | ICD-10-CM

## 2020-07-28 LAB — COMPREHENSIVE METABOLIC PANEL
AG Ratio: 2.2 (calc) (ref 1.0–2.5)
ALT: 11 U/L (ref 6–29)
AST: 15 U/L (ref 10–35)
Albumin: 4.2 g/dL (ref 3.6–5.1)
Alkaline phosphatase (APISO): 65 U/L (ref 37–153)
BUN/Creatinine Ratio: 29 (calc) — ABNORMAL HIGH (ref 6–22)
BUN: 29 mg/dL — ABNORMAL HIGH (ref 7–25)
CO2: 22 mmol/L (ref 20–32)
Calcium: 9.3 mg/dL (ref 8.6–10.4)
Chloride: 107 mmol/L (ref 98–110)
Creat: 0.99 mg/dL (ref 0.50–0.99)
Globulin: 1.9 g/dL (calc) (ref 1.9–3.7)
Glucose, Bld: 97 mg/dL (ref 65–99)
Potassium: 4.9 mmol/L (ref 3.5–5.3)
Sodium: 139 mmol/L (ref 135–146)
Total Bilirubin: 0.5 mg/dL (ref 0.2–1.2)
Total Protein: 6.1 g/dL (ref 6.1–8.1)

## 2020-07-28 LAB — LIPID PANEL
Cholesterol: 189 mg/dL (ref <200)
HDL: 75 mg/dL (ref >=50)
LDL Cholesterol (Calc): 95 mg/dL (calc)
Non-HDL Cholesterol (Calc): 114 mg/dL (calc) (ref <130)
Total CHOL/HDL Ratio: 2.5 (calc) (ref <5.0)
Triglycerides: 91 mg/dL (ref <150)

## 2020-07-28 LAB — CBC
HCT: 42.4 % (ref 35.0–45.0)
Hemoglobin: 14.3 g/dL (ref 11.7–15.5)
MCH: 30.8 pg (ref 27.0–33.0)
MCHC: 33.7 g/dL (ref 32.0–36.0)
MCV: 91.4 fL (ref 80.0–100.0)
MPV: 10.2 fL (ref 7.5–12.5)
Platelets: 172 10*3/uL (ref 140–400)
RBC: 4.64 10*6/uL (ref 3.80–5.10)
RDW: 13 % (ref 11.0–15.0)
WBC: 5.6 10*3/uL (ref 3.8–10.8)

## 2020-07-28 LAB — TSH: TSH: 0.14 mIU/L — ABNORMAL LOW (ref 0.40–4.50)

## 2020-07-28 LAB — HEMOGLOBIN A1C
Hgb A1c MFr Bld: 5.2 % of total Hgb (ref <5.7)
Mean Plasma Glucose: 103 (calc)
eAG (mmol/L): 5.7 (calc)

## 2020-07-28 LAB — VITAMIN D 25 HYDROXY (VIT D DEFICIENCY, FRACTURES): Vit D, 25-Hydroxy: 57 ng/mL (ref 30–100)

## 2020-07-28 MED ORDER — LEVOTHYROXINE SODIUM 125 MCG PO TABS: ORAL_TABLET | ORAL | 1 refills | Status: DC

## 2020-07-28 NOTE — Progress Notes (Addendum)
Chronic Care Management Pharmacy Assistant   Name: Alexandra Patterson  MRN: [id number] DOB: [birth year]  Reason for Encounter: Medication Review  Patient Questions:  1.  Have you seen any other providers since your last visit? Yes  2.  Any changes in your medicines or health? Yes    PCP : [name], MD  Allergies:   Allergies  Allergen Reactions  . Ciprofloxacin Rash and Shortness Of Breath  . Sulfa Antibiotics Hives  . Sulfasalazine Hives  . Cortisone Other (See Comments)    Red flush and anxiety  . Tramadol Other (See Comments)    headache  . Zonisamide Other (See Comments)    unk  . Erythromycin Rash  . Erythromycin Base Rash  . Levofloxacin Nausea Only  . Lisinopril     Other reaction(s): Cough (ALLERGY/intolerance)  . Prednisone Rash and Anxiety    Medications: Outpatient Encounter Medications as of 07/28/2020  Medication Sig  . amitriptyline (ELAVIL) 25 MG tablet Take 1 tablet (25 mg total) by mouth at bedtime.  Marland Kitchen atorvastatin (LIPITOR) 40 MG tablet Take 1 tablet (40 mg total) by mouth daily.  . busPIRone (BUSPAR) 10 MG tablet Take 1 tablet (10 mg total) by mouth 3 (three) times daily.  . cyclobenzaprine (FLEXERIL) 5 MG tablet Take 5 mg by mouth 3 (three) times daily as needed for muscle spasms.  . DULoxetine (CYMBALTA) 60 MG capsule Take 1 capsule (60 mg total) by mouth 2 (two) times daily.  Marland Kitchen esomeprazole (NEXIUM) 40 MG capsule Take 40 mg by mouth 2 (two) times daily before a meal.   . HYDROcodone-acetaminophen (NORCO) 5-325 MG tablet Take 1 tablet by mouth every 6 (six) hours as needed for moderate pain.  . hydrOXYzine (ATARAX/VISTARIL) 25 MG tablet Take 1 tablet (25 mg total) by mouth at bedtime as needed.  Marland Kitchen levothyroxine (SYNTHROID) 125 MCG tablet 1 tab daily as directed except no dose on Sunday  . losartan (COZAAR) 100 MG tablet Take 1 tablet (100 mg total) by mouth daily.  . Magnesium 400 MG CAPS Take 400 mg by mouth daily. Reported on 02/13/2016  .  meloxicam (MOBIC) 15 MG tablet TAKE 1 TABLET BY MOUTH EVERY DAY AS NEEDED FOR PAIN  . Methen-Hyosc-Meth Blue-Na Phos (UROGESIC-BLUE) 81.6 MG TABS Take 1 tablet (81.6 mg total) by mouth 4 (four) times daily as needed.  . metoprolol succinate (TOPROL-XL) 100 MG 24 hr tablet Take 1 tablet (100 mg total) by mouth daily. Take with or immediately following a meal.  . metoprolol succinate (TOPROL-XL) 50 MG 24 hr tablet Take 1 tablet (50 mg total) by mouth daily. Take with or immediately following a meal.  . Misc Natural Products (FIBER 7 PO) Take by mouth.  . nitrofurantoin (MACRODANTIN) 50 MG capsule Take 50 mg by mouth daily.   . ondansetron (ZOFRAN) 4 MG tablet Take 4 mg by mouth every 8 (eight) hours as needed for nausea or vomiting.  . rizatriptan (MAXALT) 10 MG tablet Take 10 mg by mouth as needed for migraine. May repeat in 2 hours if needed  . topiramate (TOPAMAX) 100 MG tablet Take 1 tablet (100 mg total) by mouth 2 (two) times daily.   No facility-administered encounter medications on file as of 07/28/2020.    Current Diagnosis: Patient Active Problem List   Diagnosis Date Noted  . Vitamin D deficiency 11/18/2019  . Headache 07/25/2019  . Educated about COVID-19 virus infection 05/23/2019  . Chronic low back pain with left-sided sciatica 05/17/2018  .  Urinary frequency 05/17/2018  . Fatigue 05/14/2018  . Interstitial cystitis 05/14/2018  . 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 Shoulder pain 06/03/2017  . Preventative health care 02/03/2017  . Depression with anxiety 06/29/2016  . Thyroiditis, subacute 06/04/2016  . UTI (urinary tract infection) 02/11/2016  . Cervical cancer screening 09/07/2015  . Cough 08/13/2015  . Insomnia 08/13/2015  . Acute stress reaction 04/25/2015  . Fibromyalgia   . Lesion of lung   . Allergy   . GERD (gastroesophageal reflux disease)   . Hyperlipidemia   . Hypertension   . Migraine   . Hyperplastic colon polyp   . Atrial fibrillation ([place])   . Hx of  blood clots   . Obesity   . History of chicken pox     Goals Addressed   None    Reviewed chart for medication changes ahead of medication coordination call.  07-27-20(PCP) Patient presented in office with Penni Homans for an annual exam.  Labs ordered.  Provider decrease Levothyroxine to 125 mcg daily.  Referral place to gastroenterology.  Referral to 3D screen breast bilateral.  Flu vaccination given.  Pneumococcal vaccination given.   BP Readings from Last 3 Encounters:  07/27/20 124/74  03/08/20 127/73  02/22/20 118/77    Lab Results  Component Value Date   HGBA1C 5.2 07/27/2020     Patient obtains medications through Adherence Packaging  90 Days   Last adherence delivery included: Only filled for 30-days on 07-21-2020 Buspirone 10 mg;one tab at [facility] [place] at Bedtime Amitriptyline 25 mg; one tab at Bedtime Hydroxyzine Hcl 25 mg;twotabs at Bedtime Losartan 100 mg; one tab at Breakfast Levothyroxine 137 mcg; one tab before BreakfastEXCEPT SUNDAY Duloxetine 60 mg; two tabs at Breakfast Meloxicam 15 mg; one tab at Breakfast Atorvastatin 40 mg; one tab at Bedtime Metoprolol Succinate 50 mg; one tab with Breakfast MetoprololSuccinate100 mg; one tab at Breakfast Topiramate 100 mg; one tab at Breakfast, one at Bedtime Cyclobenzaprine 5 mgPRN (vial)  Patient declined the following medication last month due to PRN use. Hydrocodone 5 mgPRN (vial) Rizatriptan 10 mg; PRN (vial) Urogesi-Blue tab; take four times a day PRN Esomeprazole patient gets from [place]  Patient is due for next adherence delivery on: 07/31/2020 Called patient and reviewed medications and coordinated delivery.  This delivery to include: Buspirone 10 mg;one tab at [facility] [place] at Bedtime Amitriptyline 25 mg; one tab at Bedtime Hydroxyzine Hcl 25 mg;twotabs at Bedtime Losartan 100 mg; one tab at Breakfast Levothyroxine 125 mcg; one tab before BreakfastEXCEPT  SUNDAY Duloxetine 60 mg; two tabs at Breakfast Meloxicam 15 mg; one tab at Breakfast Atorvastatin 40 mg; one tab at Bedtime Metoprolol Succinate 50 mg; one tab with Breakfast MetoprololSuccinate100 mg; one tab at Breakfast Topiramate 100 mg; one tab at Breakfast, one at Bedtime   Patient declined Hydrocodone 5 mg due toPRN (vial)   Patient needs refills for no medication at this time  Confirmed delivery date of 07-31-2020, advised patient that pharmacy will contact them the morning of delivery.  Follow-Up:  Comptroller and Pharmacist Review   [place] Shannon, [place] [facility] [place] [phone]  Reviewed by: De Blanch, PharmD Clinical Pharmacist [facility] at [facility] [phone]

## 2020-07-30 NOTE — Patient Instructions (Signed)
Visit Information  Goals Addressed            This Visit's Progress    Chronic Care Management Pharmacy Care Plan       CARE PLAN ENTRY (see longitudinal plan of care for additional care plan information)  Current Barriers:   Chronic Disease Management support, education, and care coordination needs related to Atrial Fibrillation, Hypertension, Hyperlipidemia, Depression/Anxiety, Hypothyroidism, Chronic Pain, Migraine, GERD, Interstitial Cystitis    Hypertension BP Readings from Last 3 Encounters:  07/27/20 124/74  03/08/20 127/73  02/22/20 118/77    Pharmacist Clinical Goal(s): o Over the next 90 days, patient will work with PharmD and providers to maintain BP goal <140/90  Current regimen:   Losartan 100 mg daily   Metoprolol Succinate 50 mg daily   Metoprolol Succinate 100 mg daily   Patient self care activities - Over the next 90 days, patient will: o Maintain hypertension medication regimen.  o Ensure daily salt intake < 2300 mg/Cornellius Kropp  Hyperlipidemia Lab Results  Component Value Date/Time   LDLCALC 95 07/27/2020 11:46 AM   LDLDIRECT 103.0 06/03/2017 01:53 PM    Pharmacist Clinical Goal(s): o Over the next 90 days, patient will work with PharmD and providers to achieve LDL goal < 100  Current regimen:  o Atorvastatin 40 mg daily   Patient self care activities - Over the next 90 days, patient will: o Maintain cholesterol medication regimen.   Health Maintenance   Pharmacist Clinical Goal(s) o Over the next 90 days, patient will work with PharmD and providers to complete health maintenance screenings/vaccinations  Interventions: o Complete Pneumovax 23 at age 70  Patient self care activities - Over the next 90 days, patient will: o Complete Pneumovax 23 vaccine  Medication management  Pharmacist Clinical Goal(s): o Over the next 90 days, patient will work with PharmD and providers to maintain optimal medication adherence  Current pharmacy: [facility]  Switched to [facility]  Interventions o Comprehensive medication review performed. o Utilize [facility] pharmacy for medication synchronization, packaging and delivery  Patient self care activities - Over the next 90 days, patient will: o Focus on medication adherence by filling and taking medications appropriately  o Take medications as prescribed o Report any questions or concerns to PharmD and/or provider(s)  Please see past updates related to this goal by clicking on the "Past Updates" button in the selected goal         Patient verbalizes understanding of instructions provided today.   Telephone follow up appointment with pharmacy team member scheduled for: 10/20/2020  Melvenia Beam Cozy Veale, PharmD Clinical Pharmacist [facility] at [facility] [phone]

## 2020-08-04 DIAGNOSIS — R269 Unspecified abnormalities of gait and mobility: Secondary | ICD-10-CM | POA: Diagnosis not present

## 2020-08-04 DIAGNOSIS — M7062 Trochanteric bursitis, left hip: Secondary | ICD-10-CM | POA: Diagnosis not present

## 2020-08-04 DIAGNOSIS — M6289 Other specified disorders of muscle: Secondary | ICD-10-CM | POA: Diagnosis not present

## 2020-08-04 DIAGNOSIS — M25559 Pain in unspecified hip: Secondary | ICD-10-CM | POA: Diagnosis not present

## 2020-08-04 DIAGNOSIS — Z7409 Other reduced mobility: Secondary | ICD-10-CM | POA: Diagnosis not present

## 2020-08-04 DIAGNOSIS — Z789 Other specified health status: Secondary | ICD-10-CM | POA: Diagnosis not present

## 2020-08-08 DIAGNOSIS — R269 Unspecified abnormalities of gait and mobility: Secondary | ICD-10-CM | POA: Diagnosis not present

## 2020-08-08 DIAGNOSIS — M6289 Other specified disorders of muscle: Secondary | ICD-10-CM | POA: Diagnosis not present

## 2020-08-08 DIAGNOSIS — Z789 Other specified health status: Secondary | ICD-10-CM | POA: Diagnosis not present

## 2020-08-08 DIAGNOSIS — M25559 Pain in unspecified hip: Secondary | ICD-10-CM | POA: Diagnosis not present

## 2020-08-08 DIAGNOSIS — M7062 Trochanteric bursitis, left hip: Secondary | ICD-10-CM | POA: Diagnosis not present

## 2020-08-08 DIAGNOSIS — Z7409 Other reduced mobility: Secondary | ICD-10-CM | POA: Diagnosis not present

## 2020-08-17 DIAGNOSIS — M25559 Pain in unspecified hip: Secondary | ICD-10-CM | POA: Diagnosis not present

## 2020-08-17 DIAGNOSIS — M7062 Trochanteric bursitis, left hip: Secondary | ICD-10-CM | POA: Diagnosis not present

## 2020-08-17 DIAGNOSIS — Z789 Other specified health status: Secondary | ICD-10-CM | POA: Diagnosis not present

## 2020-08-17 DIAGNOSIS — Z7409 Other reduced mobility: Secondary | ICD-10-CM | POA: Diagnosis not present

## 2020-08-17 DIAGNOSIS — R269 Unspecified abnormalities of gait and mobility: Secondary | ICD-10-CM | POA: Diagnosis not present

## 2020-08-17 DIAGNOSIS — M6289 Other specified disorders of muscle: Secondary | ICD-10-CM | POA: Diagnosis not present

## 2020-08-20 DIAGNOSIS — I4891 Unspecified atrial fibrillation: Secondary | ICD-10-CM | POA: Diagnosis not present

## 2020-08-25 DIAGNOSIS — Z95818 Presence of other cardiac implants and grafts: Secondary | ICD-10-CM | POA: Diagnosis not present

## 2020-08-25 DIAGNOSIS — I4891 Unspecified atrial fibrillation: Secondary | ICD-10-CM | POA: Diagnosis not present

## 2020-08-28 ENCOUNTER — Telehealth: Payer: Self-pay | Admitting: *Deleted

## 2020-08-28 ENCOUNTER — Other Ambulatory Visit: Payer: Self-pay | Admitting: Family Medicine

## 2020-08-28 ENCOUNTER — Telehealth: Payer: Self-pay | Admitting: Pharmacist

## 2020-08-28 MED ORDER — HYDROCODONE-ACETAMINOPHEN 5-325 MG PO TABS: 1.0000 | ORAL_TABLET | Freq: Four times a day (QID) | ORAL | 0 refills | Status: DC | PRN

## 2020-08-28 NOTE — Telephone Encounter (Signed)
-----   Message from [place] N Shannon sent at 08/28/2020 11:45 AM EST ----- Regarding: refill request Happy Holidays!!! Hope you enjoyed your family and friends.  The above patient states she needs a refill on hydrocodone due to being active for this holiday season.  Reports having family over and she cooked for two days to prepare. Can you please see if you can get this filled today for a delivery for 08/29/20.

## 2020-08-28 NOTE — Telephone Encounter (Signed)
Last written: 07/22/20 Last ov: 07/27/20 Next ov: none Contract: 02/17/20 UDS: 02/21/20

## 2020-08-28 NOTE — Telephone Encounter (Signed)
done

## 2020-08-28 NOTE — Progress Notes (Addendum)
Chronic Care Management Pharmacy Assistant   Name: Alexandra Patterson  MRN: [id number] DOB: 08/04/1956  Reason for Encounter: Medication Review  Patient Questions:  1.  Have you seen any other providers since your last visit? No  2.  Any changes in your medicines or health? No  PCP : [name], MD   Their chronic conditions include: Atrial Fibrillation, Hypertension, Hyperlipidemia, Depression/Anxiety, Hypothyroidism, Chronic Pain, Migraine, GERD, Interstitial Cystitis   Allergies:   Allergies  Allergen Reactions   Ciprofloxacin Rash and Shortness Of Breath   Sulfa Antibiotics Hives   Sulfasalazine Hives   Cortisone Other (See Comments)    Red flush and anxiety   Tramadol Other (See Comments)    headache   Zonisamide Other (See Comments)    unk   Erythromycin Rash   Erythromycin Base Rash   Levofloxacin Nausea Only   Lisinopril     Other reaction(s): Cough (ALLERGY/intolerance)   Prednisone Rash and Anxiety    Medications: Outpatient Encounter Medications as of 08/28/2020  Medication Sig Note   amitriptyline (ELAVIL) 25 MG tablet Take 1 tablet (25 mg total) by mouth at bedtime.    atorvastatin (LIPITOR) 40 MG tablet Take 1 tablet (40 mg total) by mouth daily.    busPIRone (BUSPAR) 10 MG tablet Take 1 tablet (10 mg total) by mouth 3 (three) times daily.    cyclobenzaprine (FLEXERIL) 5 MG tablet Take 5 mg by mouth 3 (three) times daily as needed for muscle spasms.    DULoxetine (CYMBALTA) 60 MG capsule Take 1 capsule (60 mg total) by mouth 2 (two) times daily.    esomeprazole (NEXIUM) 40 MG capsule Take 40 mg by mouth 2 (two) times daily before a meal.  07/31/2020: Gets from [place] 5-325 MG tablet Take 1 tablet by mouth every 6 (six) hours as needed for moderate pain.    hydrOXYzine (ATARAX/VISTARIL) 25 MG tablet Take 1 tablet (25 mg total) by mouth at bedtime as needed.    levothyroxine (SYNTHROID) 125 MCG tablet 1 tab daily as  directed except no dose on Sunday    losartan (COZAAR) 100 MG tablet Take 1 tablet (100 mg total) by mouth daily.    Magnesium 400 MG CAPS Take 400 mg by mouth daily. Reported on 02/13/2016    meloxicam (MOBIC) 15 MG tablet TAKE 1 TABLET BY MOUTH EVERY DAY AS NEEDED FOR PAIN    Methen-Hyosc-Meth Blue-Na Phos (UROGESIC-BLUE) 81.6 MG TABS Take 1 tablet (81.6 mg total) by mouth 4 (four) times daily as needed.    metoprolol succinate (TOPROL-XL) 100 MG 24 hr tablet Take 1 tablet (100 mg total) by mouth daily. Take with or immediately following a meal.    metoprolol succinate (TOPROL-XL) 50 MG 24 hr tablet Take 1 tablet (50 mg total) by mouth daily. Take with or immediately following a meal.    Misc Natural Products (FIBER 7 PO) Take by mouth.    nitrofurantoin (MACRODANTIN) 50 MG capsule Take 50 mg by mouth daily.     ondansetron (ZOFRAN) 4 MG tablet Take 4 mg by mouth every 8 (eight) hours as needed for nausea or vomiting.    rizatriptan (MAXALT) 10 MG tablet Take 10 mg by mouth as needed for migraine. May repeat in 2 hours if needed    topiramate (TOPAMAX) 100 MG tablet Take 1 tablet (100 mg total) by mouth 2 (two) times daily.    No facility-administered encounter medications on file as of 08/28/2020.  Current Diagnosis: Patient Active Problem List   Diagnosis Date Noted   Vitamin D deficiency 11/18/2019   Headache 07/25/2019   Educated about COVID-19 virus infection 05/23/2019   Chronic low back pain with left-sided sciatica 05/17/2018   Urinary frequency 05/17/2018   Fatigue 05/14/2018   Interstitial cystitis 05/14/2018   Hair loss disorder 06/03/2017   Shoulder pain 06/03/2017   Preventative health care 02/03/2017   Depression with anxiety 06/29/2016   Thyroiditis, subacute 06/04/2016   UTI (urinary tract infection) 02/11/2016   Cervical cancer screening 09/07/2015   Cough 08/13/2015   Insomnia 08/13/2015   Acute stress reaction 04/25/2015   Fibromyalgia    Lesion of lung     Allergy    GERD (gastroesophageal reflux disease)    Hyperlipidemia    Hypertension    Migraine    Hyperplastic colon polyp    Atrial fibrillation ([facility])    Hx of blood clots    Obesity    History of chicken pox     Goals Addressed   None    No OVs, Consults, or hospital visits since last care coordination call.  No medication changes indicated.  BP Readings from Last 3 Encounters:  07/27/20 124/74  03/08/20 127/73  02/22/20 118/77    Lab Results  Component Value Date   HGBA1C 5.2 07/27/2020     Patient obtains medications through Adherence Packaging  90 Days   Last adherence delivery included: Buspirone 10 mg; one tab at Breakfast, one at Lunch, one at Bedtime Amitriptyline 25 mg; one tab at Bedtime Hydroxyzine Hcl 25 mg; two tabs at Bedtime Losartan 100 mg; one tab at Breakfast Levothyroxine 125 mcg; one tab before Breakfast EXCEPT SUNDAY Duloxetine 60 mg; two tabs at Breakfast Meloxicam 15 mg; one tab at Breakfast Atorvastatin 40 mg; one tab at Bedtime Metoprolol Succinate 50 mg; one tab with Breakfast Metoprolol Succinate 100 mg; one tab at Breakfast Topiramate 100 mg; one tab at Breakfast, one at Bedtime  Patient declined Hydrocodone 5 mg due to PRN (vial)  Patient is due for next adherence delivery on: 08/29/20. Called patient and reviewed medications and coordinated delivery.  This delivery to include: Hydrocodone 5 mg due to PRN (vial) Cyclobenzaprine 5 mg due to PRN   Patient declined the following medications due to 90-day supply on 07-31-20. Buspirone 10 mg; one tab at Breakfast, one at Lunch, one at Bedtime Amitriptyline 25 mg; one tab at Bedtime Hydroxyzine Hcl 25 mg; two tabs at Bedtime Losartan 100 mg; one tab at Breakfast Levothyroxine 125 mcg; one tab before Breakfast EXCEPT SUNDAY Duloxetine 60 mg; two tabs at Breakfast Meloxicam 15 mg; one tab at Breakfast Atorvastatin 40 mg; one tab at Bedtime Metoprolol Succinate 50 mg; one tab with  Breakfast Metoprolol Succinate 100 mg; one tab at Breakfast Topiramate 100 mg; one tab at Breakfast, one at Bedtime  Patient needs refills for  Hydrocodone 5 mg due to PRN (vial)(message sent to PCP nurse)  Confirmed delivery date of 08-29-20, advised patient that pharmacy will contact them the morning of delivery.  Follow-Up:  Comptroller and Pharmacist Review   [place] Shannon, [place] [facility] [place] [phone]  Reviewed by: De Blanch, PharmD Clinical Pharmacist [facility] at [facility] [phone]

## 2020-08-31 ENCOUNTER — Other Ambulatory Visit: Payer: Self-pay | Admitting: Family Medicine

## 2020-08-31 DIAGNOSIS — Z1239 Encounter for other screening for malignant neoplasm of breast: Secondary | ICD-10-CM

## 2020-09-05 ENCOUNTER — Encounter: Payer: Self-pay | Admitting: Internal Medicine

## 2020-09-05 ENCOUNTER — Telehealth (INDEPENDENT_AMBULATORY_CARE_PROVIDER_SITE_OTHER): Payer: PPO | Admitting: Internal Medicine

## 2020-09-05 VITALS — Temp 98.6°F | Ht 67.0 in | Wt 250.0 lb

## 2020-09-05 DIAGNOSIS — J40 Bronchitis, not specified as acute or chronic: Secondary | ICD-10-CM

## 2020-09-05 MED ORDER — AMOXICILLIN 875 MG PO TABS: 875.0000 mg | ORAL_TABLET | Freq: Two times a day (BID) | ORAL | 0 refills | Status: DC

## 2020-09-05 MED ORDER — HYDROCODONE-HOMATROPINE 5-1.5 MG/5ML PO SYRP
5.0000 mL | ORAL_SOLUTION | Freq: Two times a day (BID) | ORAL | 0 refills | Status: DC | PRN
Start: 2020-09-05 — End: 2020-09-07

## 2020-09-05 NOTE — Progress Notes (Signed)
Subjective:    Patient ID: Alexandra Patterson, female    DOB: 08/22/1956, 64 y.o.   MRN: [id number]  DOS:  09/05/2020 Type of visit - description: Virtual Visit via Telephone    I connected with above mentioned patient  by telephone and verified that I am speaking with the correct person using two identifiers.  THIS ENCOUNTER IS A VIRTUAL VISIT DUE TO COVID-19 - PATIENT WAS NOT SEEN IN THE OFFICE. PATIENT HAS CONSENTED TO VIRTUAL VISIT / TELEMEDICINE VISIT   Location of patient: home  Location of provider: office  Persons participating in the virtual visit: patient, provider   I discussed the limitations, risks, security and privacy concerns of performing an evaluation and management service by telephone and the availability of in person appointments. I also discussed with the patient that there may be a patient responsible charge related to this service. The patient expressed understanding and agreed to proceed.  Acute Patient developed cough 3 days ago: Cough is deep, intense, cannot sleep due to cough.  She took some Mucinex which helped but not completely.  No sputum production. She also has developed head and sinus congestion, saline nose spray helped. Her husband has similar symptoms "for a month". Her daughter lives with them, she was recently diagnosed with strep throat.   Review of Systems Denies fever chills. No myalgias No nausea or vomiting except when cough is intense. No sore throat other than when she coughs  Past Medical History:  Diagnosis Date  . Allergy   . Anxiety   . Atrial fibrillation ([place])   . Back pain   . Bilateral swelling of feet   . Bursitis   . Cervical cancer screening 09/07/2015   Menarche at 11 Irregular and heavy and painful flow secondary to fibroids No history of abnormal pap in past, last pap roughly 2003 G2P2, s/p 2 SVD history of abnormal MGM, 1 abnl bx right breast benign, normal otherwise Noconcerns today TAH b/l SPO for fibroids and  migrainesand breast bx on right gyn surgeries  . Constipation   . Cough 08/13/2015  . Depression   . Depression with anxiety 06/29/2016  . Fainting    once to due heart out of rythm  . Fibromyalgia   . Frequent headaches   . GERD (gastroesophageal reflux disease)   . 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 Heartburn   . History of blood clots   . History of chicken pox   . Hx of blood clots    dvt  . Hyperlipidemia   . Hyperplastic colon polyp   . Hypertension   . Hypothyroid   . Insomnia 08/13/2015  . Internal hemorrhoids   . Interstitial cystitis   . Joint pain   . Lesion of lung    xray in 2014 thought to be benign seen at [facility] pulmonology  . Measles    h/o  . Migraine   . Obesity   . Osteoarthritis   . Preventative health care 02/03/2017  . Raynaud's disease   . S/P AVR (aortic valve replacement) 03/19/2015  . Shoulder pain 06/03/2017  . UTI (lower urinary tract infection)   . UTI (urinary tract infection) 02/11/2016    Past Surgical History:  Procedure Laterality Date  . ABDOMINAL HYSTERECTOMY     TAH SPO  . AORTIC VALVE REPLACEMENT  2014   with [facility]  . ATRIAL ABLATION SURGERY    . BLADDER SUSPENSION    . BREAST BIOPSY Right    Needle Biopsy  .  DILATION AND CURETTAGE OF UTERUS     x 3  . loop heart    . SHOULDER SURGERY Left    arthroscopy for spurs  . TONSILLECTOMY    . TUBAL LIGATION      Allergies as of 09/05/2020      Reactions   Ciprofloxacin Rash, Shortness Of Breath   Sulfa Antibiotics Hives   Sulfasalazine Hives   Cortisone Other (See Comments)   Red flush and anxiety   Tramadol Other (See Comments)   headache   Zonisamide Other (See Comments)   unk   Erythromycin Rash   Erythromycin Base Rash   Levofloxacin Nausea Only   Lisinopril    Other reaction(s): Cough (ALLERGY/intolerance)   Prednisone Rash, Anxiety      Medication List       Accurate as of September 05, 2020  4:16 PM. If you have any questions, ask your nurse or doctor.         amitriptyline 25 MG tablet Commonly known as: ELAVIL Take 1 tablet (25 mg total) by mouth at bedtime.   atorvastatin 40 MG tablet Commonly known as: LIPITOR Take 1 tablet (40 mg total) by mouth daily.   busPIRone 10 MG tablet Commonly known as: BUSPAR Take 1 tablet (10 mg total) by mouth 3 (three) times daily.   cyclobenzaprine 5 MG tablet Commonly known as: FLEXERIL Take 5 mg by mouth 3 (three) times daily as needed for muscle spasms.   DULoxetine 60 MG capsule Commonly known as: CYMBALTA Take 1 capsule (60 mg total) by mouth 2 (two) times daily.   esomeprazole 40 MG capsule Commonly known as: NEXIUM Take 40 mg by mouth 2 (two) times daily before a meal.   FIBER 7 PO Take by mouth.   HYDROcodone-acetaminophen 5-325 MG tablet Commonly known as: Norco Take 1 tablet by mouth every 6 (six) hours as needed for moderate pain.   hydrOXYzine 25 MG tablet Commonly known as: ATARAX/VISTARIL Take 1 tablet (25 mg total) by mouth at bedtime as needed.   levothyroxine 125 MCG tablet Commonly known as: SYNTHROID 1 tab daily as directed except no dose on Sunday   losartan 100 MG tablet Commonly known as: COZAAR Take 1 tablet (100 mg total) by mouth daily.   Magnesium 400 MG Caps Take 400 mg by mouth daily. Reported on 02/13/2016   meloxicam 15 MG tablet Commonly known as: MOBIC TAKE 1 TABLET BY MOUTH EVERY DAY AS NEEDED FOR PAIN   metoprolol succinate 100 MG 24 hr tablet Commonly known as: TOPROL-XL Take 1 tablet (100 mg total) by mouth daily. Take with or immediately following a meal.   metoprolol succinate 50 MG 24 hr tablet Commonly known as: TOPROL-XL Take 1 tablet (50 mg total) by mouth daily. Take with or immediately following a meal.   nitrofurantoin 50 MG capsule Commonly known as: MACRODANTIN Take 50 mg by mouth daily.   ondansetron 4 MG tablet Commonly known as: ZOFRAN Take 4 mg by mouth every 8 (eight) hours as needed for nausea or vomiting.    rizatriptan 10 MG tablet Commonly known as: MAXALT Take 10 mg by mouth as needed for migraine. May repeat in 2 hours if needed   Topamax 100 MG tablet Generic drug: topiramate Take 1 tablet (100 mg total) by mouth 2 (two) times daily.   Urogesic-Blue 81.6 MG Tabs Take 1 tablet (81.6 mg total) by mouth 4 (four) times daily as needed.          Objective:  Physical Exam Temp 98.6 F (37 C) (Oral)   Ht 5\' 7"  (1.702 m)   Wt 250 lb (113.4 kg)   BMI 39.16 kg/m  This is a telephone assessment, she is alert oriented x3, speaking complete sentences, no distress noted.    Assessment     64 year old female, PMH includes hypertension, CAD, high blood pressure, migraines, thyroid disease, paroxysmal A. fib, on multiple medications, not anticoagulated, presents with  Bronchitis: Rest, fluids, Flonase, Mucinex, Tylenol. Cough control with hydrocodone, watch for drowsiness. If not better needs to 3 days: Start amoxicillin (prescription sent) and get the PCR Covid test. I explained the instructions in detail, she verbalized understanding, in addition will send a letter via [place].    I discussed the assessment and treatment plan with the patient. The patient was provided an opportunity to ask questions and all were answered. The patient agreed with the plan and demonstrated an understanding of the instructions.   The patient was advised to call back or seek an in-person evaluation if the symptoms worsen or if the condition fails to improve as anticipated.  I provided 22 minutes of non-face-to-face time during this encounter.  Kathlene November, MD

## 2020-09-06 ENCOUNTER — Telehealth: Payer: Self-pay | Admitting: Pharmacist

## 2020-09-06 NOTE — Progress Notes (Addendum)
Contacted the patient to inform her that she would get a partial delivery on Amoxicillin 875 mg today with [facility], and the remaining tablets would be delivered tomorrow. I also obtained pharmacy options from her to have medication HYDROcodone-homatropine  5-1.5 MG/5ML syrup because [facility] does not have this medication in stock and it in on back order with estimated date of availability. Patient provided two pharmacies she would be willing to use in order to obtain the prescription from. [facility] [address], [place], [place] [address] and [facility] on [address], [place], [place] [address].   [facility]ed [facility] at [phone] and spoke with Theresia Lo who informed me that medication was not in stock with their pharmacy at this time. It was ordered, but not delivered and he was not sure when it would become available. Called [facility] at [phone] and spoke with Fernande Boyden who informed me there were two bottles left at their location. Will have new script sent to this pharmacy upon provider agreement.  Contacted the patient to inform her of this and advised that if [facility] did not inform her of a pick up within the next hour to call them to get an idea of when the medication would be ready. I did review her medications at that time to ensure she did not need anything else. Patient stated she was up to date with all medication at this time. Her next dispensing date would be 10-29-2020.  Fanny Skates, [place] Pharmacist Assistant [phone]  Reviewed by: De Blanch, PharmD, BCACP Clinical Pharmacist [facility] [phone]

## 2020-09-07 ENCOUNTER — Telehealth: Payer: Self-pay | Admitting: Pharmacist

## 2020-09-07 MED ORDER — HYDROCODONE-HOMATROPINE 5-1.5 MG/5ML PO SYRP: 5.0000 mL | ORAL_SOLUTION | Freq: Two times a day (BID) | ORAL | 0 refills | Status: DC | PRN

## 2020-09-07 NOTE — Telephone Encounter (Signed)
The rx for the cough syrup hycodan needs to be resent to local pharmacy[facility].  [facility] does not have it in stock.

## 2020-09-07 NOTE — Telephone Encounter (Signed)
Sent!

## 2020-09-07 NOTE — Progress Notes (Addendum)
Patient called me this morning stating that [facility] did not receive the prescription for her to pick up yesterday. I requested that her HYDROcodone-homatropine (HYCODAN) 5-1.5 MG/5ML syrup be sent to [facility] at [address] [place], [place] [address] for the patient to pick up. Sent another message to the [facility] asking that this prescription be sent in for the patient.  Spoke with Butch Penny at [facility] who informed me that the HYDROcodone-homatropine [facility]) 5-1.5 MG/5ML syrup prescription had been received from the PCP and is waiting pick up. Contacted the patient to inform her of this. Left her a message.  Fanny Skates, [place] Pharmacist Assistant [phone]  4 minutes spent in review, coordination, and documentation.   Reviewed by: De Blanch, PharmD, BCACP Clinical Pharmacist [facility] [phone]

## 2020-09-08 ENCOUNTER — Ambulatory Visit: Payer: PPO

## 2020-09-12 ENCOUNTER — Other Ambulatory Visit: Payer: Self-pay

## 2020-09-12 ENCOUNTER — Ambulatory Visit
Admission: RE | Admit: 2020-09-12 | Discharge: 2020-09-12 | Disposition: A | Discharge status: Home / Self Care | Payer: PPO | Source: Ambulatory Visit | Attending: Family Medicine | Admitting: Family Medicine

## 2020-09-12 DIAGNOSIS — Z1231 Encounter for screening mammogram for malignant neoplasm of breast: Secondary | ICD-10-CM | POA: Diagnosis not present

## 2020-09-12 DIAGNOSIS — Z1239 Encounter for other screening for malignant neoplasm of breast: Secondary | ICD-10-CM

## 2020-09-21 ENCOUNTER — Other Ambulatory Visit: Payer: Self-pay | Admitting: Family Medicine

## 2020-09-25 ENCOUNTER — Telehealth: Payer: Self-pay | Admitting: Pharmacist

## 2020-09-25 NOTE — Progress Notes (Addendum)
Chronic Care Management Pharmacy Assistant   Name: Alexandra Patterson  MRN: [id number] DOB: 05/02/1956  Reason for Encounter: Medication Review    PCP : [name], MD  Allergies:   Allergies  Allergen Reactions   Ciprofloxacin Rash and Shortness Of Breath   Sulfa Antibiotics Hives   Sulfasalazine Hives   Cortisone Other (See Comments)    Red flush and anxiety   Tramadol Other (See Comments)    headache   Zonisamide Other (See Comments)    unk   Erythromycin Rash   Erythromycin Base Rash   Levofloxacin Nausea Only   Lisinopril     Other reaction(s): Cough (ALLERGY/intolerance)   Prednisone Rash and Anxiety    Medications: Outpatient Encounter Medications as of 09/25/2020  Medication Sig Note   amitriptyline (ELAVIL) 25 MG tablet Take 1 tablet (25 mg total) by mouth at bedtime.    amoxicillin (AMOXIL) 875 MG tablet Take 1 tablet (875 mg total) by mouth 2 (two) times daily.    atorvastatin (LIPITOR) 40 MG tablet Take 1 tablet (40 mg total) by mouth at bedtime.    busPIRone (BUSPAR) 10 MG tablet Take 1 tablet (10 mg total) by mouth 3 (three) times daily.    cyclobenzaprine (FLEXERIL) 5 MG tablet Take 5 mg by mouth 3 (three) times daily as needed for muscle spasms.    DULoxetine (CYMBALTA) 60 MG capsule Take 2 capsules (120 mg total) by mouth daily.    esomeprazole (NEXIUM) 40 MG capsule Take 40 mg by mouth 2 (two) times daily before a meal.  07/31/2020: Gets from [place] ([place]) 5-325 MG tablet Take 1 tablet by mouth every 6 (six) hours as needed for moderate pain.    HYDROcodone-homatropine (HYCODAN) 5-1.5 MG/5ML syrup Take 5 mLs by mouth 2 (two) times daily as needed for cough.    hydrOXYzine (ATARAX/VISTARIL) 25 MG tablet Take 1 tablet (25 mg total) by mouth at bedtime as needed.    levothyroxine (SYNTHROID) 125 MCG tablet 1 tab daily as directed except no dose on Sunday    losartan (COZAAR) 100 MG tablet Take 1 tablet (100 mg total) by mouth  every morning.    Magnesium 400 MG CAPS Take 400 mg by mouth daily. Reported on 02/13/2016    meloxicam (MOBIC) 15 MG tablet Take 1 tablet (15 mg total) by mouth daily as needed for pain.    Methen-Hyosc-Meth Blue-Na Phos (UROGESIC-BLUE) 81.6 MG TABS Take 1 tablet (81.6 mg total) by mouth 4 (four) times daily as needed.    metoprolol succinate (TOPROL-XL) 100 MG 24 hr tablet Take 1 tablet (100 mg total) by mouth daily. Take with or immediately following a meal.    metoprolol succinate (TOPROL-XL) 50 MG 24 hr tablet Take 1 tablet (50 mg total) by mouth daily. Take with or immediately following a meal.    Misc Natural Products (FIBER 7 PO) Take by mouth.    nitrofurantoin (MACRODANTIN) 50 MG capsule Take 50 mg by mouth daily.     ondansetron (ZOFRAN) 4 MG tablet Take 4 mg by mouth every 8 (eight) hours as needed for nausea or vomiting.    rizatriptan (MAXALT) 10 MG tablet Take 10 mg by mouth as needed for migraine. May repeat in 2 hours if needed    topiramate (TOPAMAX) 100 MG tablet Take 1 tablet (100 mg total) by mouth 2 (two) times daily.    No facility-administered encounter medications on file as of 09/25/2020.    Current Diagnosis:  Patient Active Problem List   Diagnosis Date Noted   Vitamin D deficiency 11/18/2019   Headache 07/25/2019   Educated about COVID-19 virus infection 05/23/2019   Chronic low back pain with left-sided sciatica 05/17/2018   Urinary frequency 05/17/2018   Fatigue 05/14/2018   Interstitial cystitis 05/14/2018   Hair loss disorder 06/03/2017   Shoulder pain 06/03/2017   Preventative health care 02/03/2017   Depression with anxiety 06/29/2016   Thyroiditis, subacute 06/04/2016   UTI (urinary tract infection) 02/11/2016   Cervical cancer screening 09/07/2015   Cough 08/13/2015   Insomnia 08/13/2015   Acute stress reaction 04/25/2015   Fibromyalgia    Lesion of lung    Allergy    GERD (gastroesophageal reflux disease)    Hyperlipidemia    Hypertension     Migraine    Hyperplastic colon polyp    Atrial fibrillation (HCC)    Hx of blood clots    Obesity    History of chicken pox     Goals Addressed   None    Reviewed chart for medication changes ahead of medication coordination call.  No OVs, Consults, or hospital visits since last care coordination call.   No medication changes indicated.  BP Readings from Last 3 Encounters:  07/27/20 124/74  03/08/20 127/73  02/22/20 118/77    Lab Results  Component Value Date   HGBA1C 5.2 07/27/2020     Patient obtains medications through Adherence Packaging  90 Days   Last adherence delivery included:  Buspirone 10 mg; one tab at Breakfast, one at Lunch, one at Bedtime Amitriptyline 25 mg; one tab at Bedtime Hydroxyzine Hcl 25 mg; two tabs at Bedtime Losartan 100 mg; one tab at Breakfast Levothyroxine 125 mcg; one tab before Breakfast EXCEPT SUNDAY Duloxetine 60 mg; two tabs at Breakfast Meloxicam 15 mg; one tab at Breakfast Atorvastatin 40 mg; one tab at Bedtime Metoprolol Succinate 50 mg; one tab with Breakfast Metoprolol Succinate 100 mg; one tab at Breakfast Topiramate 100 mg; one tab at Breakfast, one at Bedtime   Patient is due for next adherence delivery on: 09-28-2020. Called patient and reviewed medications and coordinated delivery.  This delivery to include: Cyclobenzaprine 5 mg tab twice a day as needed for muscle spasms.   Patient declined the following medications due to 90-day supply on 07-31-20. Buspirone 10 mg; one tab at Breakfast, one at Lunch, one at Bedtime Amitriptyline 25 mg; one tab at Bedtime Hydroxyzine Hcl 25 mg; two tabs at Bedtime Losartan 100 mg; one tab at Breakfast Levothyroxine 125 mcg; one tab before Breakfast EXCEPT SUNDAY Duloxetine 60 mg; two tabs at Breakfast Meloxicam 15 mg; one tab at Breakfast Atorvastatin 40 mg; one tab at Bedtime Metoprolol Succinate 50 mg; one tab with Breakfast Metoprolol Succinate 100 mg; one tab at  Breakfast Topiramate 100 mg; one tab at Breakfast, one at Bedtime   Patient will needs refills Topiramate 100 mg before her next adherence delivery.  Confirmed delivery date of 09-28-2020, advised patient that pharmacy will contact them the morning of delivery.  Follow-Up:  Pharmacist Review   Corwin Levins, [facility] Clinical Pharmacist Assistant [phone]  5 minutes spent in review, coordination, and documentation.   Reviewed by: Katrinka Blazing, PharmD, BCACP Clinical Pharmacist [facility] [phone]

## 2020-10-03 ENCOUNTER — Encounter: Payer: Self-pay | Admitting: Family Medicine

## 2020-10-17 NOTE — Chronic Care Management (AMB) (Deleted)
Chronic Care Management Pharmacy  Name: Alexandra Patterson  MRN: [id number] DOB: 1956-06-04   Chief Complaint/ HPI  Alexandra Patterson,  65 y.o. , female presents for their Follow-Up CCM visit with the clinical pharmacist via telephone due to COVID-19 Pandemic.  PCP : [name], MD Patient Care Team: [name], MD as PCP - General (Family Medicine) Veneda Melter, MD as Referring Physician (Cardiology) Dudley Major, MD as Referring Physician (Internal Medicine) Lunette Stands Glori Bickers, MD as Referring Physician (Cardiothoracic Surgery) Kerin Perna., MD as Referring Physician (Neurology) Day, Melvenia Beam, [facility] as Pharmacist (Pharmacist)  Their chronic conditions include: Atrial Fibrillation, Hypertension, Hyperlipidemia, Depression/Anxiety, Hypothyroidism, Chronic Pain, Migraine, GERD, Interstitial Cystitis   Office Visits: 09/05/2020 Larose Kells) - bronchitis, rest, Flonase, Mucinex and Tylenol.  If no better within 3 days then start amoxicillin.  Consult Visit: 07/03/20: Ortho visit w/ Dr. Case - X-ray of help, pelvis, and thigh: Left hip trochanteric bursitis. Refer to PT. F/U in 4-6 weeks if pain does not improve.   Allergies  Allergen Reactions  . Ciprofloxacin Rash and Shortness Of Breath  . Sulfa Antibiotics Hives  . Sulfasalazine Hives  . Cortisone Other (See Comments)    Red flush and anxiety  . Tramadol Other (See Comments)    headache  . Zonisamide Other (See Comments)    unk  . Erythromycin Rash  . Erythromycin Base Rash  . Levofloxacin Nausea Only  . Lisinopril     Other reaction(s): Cough (ALLERGY/intolerance)  . Prednisone Rash and Anxiety    Medications: Outpatient Encounter Medications as of 10/20/2020  Medication Sig Note  . amitriptyline (ELAVIL) 25 MG tablet Take 1 tablet (25 mg total) by mouth at bedtime.   [place] amoxicillin (AMOXIL) 875 MG tablet Take 1 tablet (875 mg total) by mouth 2 (two) times daily.   [place] atorvastatin (LIPITOR) 40 MG tablet Take  1 tablet (40 mg total) by mouth at bedtime.   . busPIRone (BUSPAR) 10 MG tablet Take 1 tablet (10 mg total) by mouth 3 (three) times daily.   . cyclobenzaprine (FLEXERIL) 5 MG tablet Take 5 mg by mouth 3 (three) times daily as needed for muscle spasms.   . DULoxetine (CYMBALTA) 60 MG capsule Take 2 capsules (120 mg total) by mouth daily.   Marland Kitchen esomeprazole (NEXIUM) 40 MG capsule Take 40 mg by mouth 2 (two) times daily before a meal.  07/31/2020: Gets from [place]  . HYDROcodone-acetaminophen (NORCO) 5-325 MG tablet Take 1 tablet by mouth every 6 (six) hours as needed for moderate pain.   Marland Kitchen HYDROcodone-homatropine (HYCODAN) 5-1.5 MG/5ML syrup Take 5 mLs by mouth 2 (two) times daily as needed for cough.   . hydrOXYzine (ATARAX/VISTARIL) 25 MG tablet Take 1 tablet (25 mg total) by mouth at bedtime as needed.   Mar[place] Kitchen levothyroxine (SYNTHROID) 125 MCG tablet 1 tab daily as directed except no dose on Sunday   . losartan (COZAAR) 100 MG tablet Take 1 tablet (100 mg total) by mouth every morning.   . Magnesium 400 MG CAPS Take 400 mg by mouth daily. Reported on 02/13/2016   . meloxicam (MOBIC) 15 MG tablet Take 1 tablet (15 mg total) by mouth daily as needed for pain.   . Methen-Hyosc-Meth Blue-Na Phos (UROGESIC-BLUE) 81.6 MG TABS Take 1 tablet (81.6 mg total) by mouth 4 (four) times daily as needed.   . metoprolol succinate (TOPROL-XL) 100 MG 24 hr tablet Take 1 tablet (100 mg total) by mouth every morning.   Marland Kitchen  metoprolol succinate (TOPROL-XL) 50 MG 24 hr tablet Take 1 tablet (50 mg total) by mouth every morning.   . Misc Natural Products (FIBER 7 PO) Take by mouth.   . nitrofurantoin (MACRODANTIN) 50 MG capsule Take 50 mg by mouth daily.    . ondansetron (ZOFRAN) 4 MG tablet Take 4 mg by mouth every 8 (eight) hours as needed for nausea or vomiting.   . rizatriptan (MAXALT) 10 MG tablet Take 10 mg by mouth as needed for migraine. May repeat in 2 hours if needed   . topiramate (TOPAMAX) 100 MG tablet Take 1 tablet  (100 mg total) by mouth 2 (two) times daily.   . [DISCONTINUED] busPIRone (BUSPAR) 10 MG tablet Take 1 tablet (10 mg total) by mouth 3 (three) times daily.   . [DISCONTINUED] metoprolol succinate (TOPROL-XL) 100 MG 24 hr tablet Take 1 tablet (100 mg total) by mouth daily. Take with or immediately following a meal.   . [DISCONTINUED] metoprolol succinate (TOPROL-XL) 50 MG 24 hr tablet Take 1 tablet (50 mg total) by mouth daily. Take with or immediately following a meal.    No facility-administered encounter medications on file as of 10/20/2020.   SDOH Screenings   Alcohol Screen: Not on file  Depression (PHQ2-9): Medium Risk  . PHQ-2 Score: 12  Financial Resource Strain: Not on file  Food Insecurity: Not on file  Housing: Not on file  Physical Activity: Not on file  Social Connections: Not on file  Stress: Not on file  Tobacco Use: Low Risk   . Smoking Tobacco Use: Never Smoker  . Smokeless Tobacco Use: Never Used  Transportation Needs: Not on file     Current Diagnosis/Assessment:    Goals Addressed   None   Social:  Married for 45 years. Lives with her husband.  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  She needs time alone to recharge,  Two children. Son and Daughter Alexandra Patterson works at [employer]). Two grandsons. Mother is still living at age 33. She is having to do more things for her. She is concerned about her mom. She thinks her mom might have heart failure. She knows her mom doesn't want to go to the nursing home.   Update 07/13/20 Eval for PT starts on 07/24/20. Thinks she might do it twice daily. Unaware of the duration.  Hypertension   BP goal is:  <140/90  Office blood pressures are  BP Readings from Last 3 Encounters:  07/27/20 124/74  03/08/20 127/73  02/22/20 118/77   CMP Latest Ref Rng & Units  07/27/2020 01/27/2020 01/24/2020  Glucose 65 - 99 mg/dL 97 96 86  BUN 7 - 25 mg/dL 29(H) 20 13  Creatinine 0.50 - 0.99 mg/dL 0.99 1.00 0.81  Sodium 135 - 146 mmol/L 139 133(L) 127(L)  Potassium 3.5 - 5.3 mmol/L 4.9 4.7 4.4  Chloride 98 - 110 mmol/L 107 100 94(L)  CO2 20 - 32 mmol/L '22 26 23  ' Calcium 8.6 - 10.4 mg/dL 9.3 9.1 9.0  Total Protein 6.1 - 8.1 g/dL 6.1 - 6.3  Total Bilirubin 0.2 - 1.2 mg/dL 0.5 - 0.4  Alkaline Phos 39 - 117 IU/L - - 79  AST 10 - 35 U/L 15 - 19  ALT 6 - 29 U/L 11 - 18   Patient checks BP at home infrequently Patient home BP readings are ranging: Unable to  assess  Patient has failed these meds in the past: Irbesartan, lisinopril Patient is currently controlled on the following medications:  . Losartan 100 mg daily  . Metoprolol Succinate 50 mg daily  . Metoprolol Succinate 100 mg daily   Update 07/13/20 Has not been checking.   Plan -Continue current medications   Hyperlipidemia   LDL goal <100  Lipid Panel     Component Value Date/Time   CHOL 189 07/27/2020 1146   TRIG 91 07/27/2020 1146   HDL 75 07/27/2020 1146   LDLCALC 95 07/27/2020 1146   LDLDIRECT 103.0 06/03/2017 1353    Hepatic Function Latest Ref Rng & Units 07/27/2020 01/24/2020 11/23/2019  Total Protein 6.1 - 8.1 g/dL 6.1 6.3 6.2  Albumin 3.8 - 4.8 g/dL - 4.4 4.3  AST 10 - 35 U/L '15 19 15  ' ALT 6 - 29 U/L '11 18 19  ' Alk Phosphatase 39 - 117 IU/L - 79 56  Total Bilirubin 0.2 - 1.2 mg/dL 0.5 0.4 0.5     The 10-year ASCVD risk score Mikey Bussing DC Jr., et al., 2013) is: 6%   Values used to calculate the score:     Age: 41 years     Sex: Female     Is Non-Hispanic African American: No     Diabetic: No     Tobacco smoker: No     Systolic Blood Pressure: 347 mmHg     Is BP treated: Yes     HDL Cholesterol: 75 mg/dL     Total Cholesterol: 189 mg/dL   Patient has failed these meds in past: Simvastatin 40 mg  Patient is currently uncontrolled on the following medications:  . Atorvastatin  40 mg daily   Update 07/13/20 Would benefit from updated lipid panel  Plan -Continue current medications   Depression / Anxiety   PHQ9 Score:  PHQ9 SCORE ONLY 07/13/2020 01/24/2020 01/14/2019  PHQ-9 Total Score '12 9 7   ' GAD7 Score: No flowsheet data found.  Patient has failed these meds in past: venlafaxine, olanzapine (listed in D/C meds. No apparent reason for D/C) Patient is currently controlled on the following medications:  . Buspirone 10 mg TID (usually takes twice daily) . Duloxetine 60 mg #2 daily  Update 07/13/20 PHQ9: 12 Sometimes wonders if something else to go with the duloxetine, but does not want to take another medication.   GAD7: 6 Feels buspirone works really well.  Plan -Continue current medications     Misc / OTC    . Magnesium complex 516m daily (takes it to replace magnesium and for constipation) . Urogesic blue 81.6 QID PRN (per Dr. EAmalia Hailey . Nitrofurantoin 50 mg daily (per Dr. EAmalia Hailey . Ondansetron 4 mg q8hr PRN   . Probiotic . Fiber . Vitamin D3 5000 units   Vaccines   Reviewed and discussed patient's vaccination history.    Immunization History  Administered Date(s) Administered  . Fluad Quad(high Dose 65+) 07/27/2020  . Influenza,inj,Quad PF,6+ Mos 07/29/2019  . PFIZER(Purple Top)SARS-COV-2 Vaccination 12/23/2019, 01/17/2020  . Pneumococcal Polysaccharide-23 07/27/2020  . Tdap 05/26/2015  . Zoster 05/26/2015  . Zoster Recombinat (Shingrix) 05/29/2018, 08/01/2018    Plan  Recommended patient receive Pneumovax 23 at age 1130vaccine in pharmacy.    [facility]   Patient obtains medications through {Packaging choices:23805}  {Day Supply:23806}   Last adherence delivery included: (medication name and frequency)  Patient declined (meds) last month due to PRN use/additional supply on hand. Explanation of abundance on hand (ie #30 due to overlapping  fills or previous adherence issues etc)  Patient is due for next adherence delivery  on: ***. Called patient and reviewed medications and coordinated delivery.  This delivery to include: Patient will need a short fill of (med), prior to adherence delivery. (To align with sync date or if PRN med)  Coordinated acute fill for (med) to be delivered (date).  Patient declined the following medications (meds) due to (reason)  Patient needs refills for ***.  Confirmed delivery date of ***, advised patient that pharmacy will contact them the morning of delivery.   Beverly Milch, PharmD Clinical Pharmacist [place] [phone]

## 2020-10-19 ENCOUNTER — Other Ambulatory Visit: Payer: Self-pay | Admitting: Family Medicine

## 2020-10-19 NOTE — Telephone Encounter (Signed)
Is Pt supposed to be on both doses of metoprolol XL 50mg  and 100mg ? Please advise.

## 2020-10-19 NOTE — Telephone Encounter (Signed)
Please confirm what dose of Metoprolol she is taking and how and then we can refill her meds.

## 2020-10-20 ENCOUNTER — Telehealth: Payer: Self-pay | Admitting: Pharmacist

## 2020-10-20 ENCOUNTER — Telehealth: Payer: PPO

## 2020-10-20 NOTE — Telephone Encounter (Addendum)
Spoke with patient and she is on both doses.

## 2020-10-20 NOTE — Progress Notes (Addendum)
Chronic Care Management Pharmacy Assistant   Name: Alexandra Patterson  MRN: [id number] DOB: 26-Oct-1955  Reason for Encounter: Medication Review   PCP : [name], MD  Allergies:   Allergies  Allergen Reactions   Ciprofloxacin Rash and Shortness Of Breath   Sulfa Antibiotics Hives   Sulfasalazine Hives   Cortisone Other (See Comments)    Red flush and anxiety   Tramadol Other (See Comments)    headache   Zonisamide Other (See Comments)    unk   Erythromycin Rash   Erythromycin Base Rash   Levofloxacin Nausea Only   Lisinopril     Other reaction(s): Cough (ALLERGY/intolerance)   Prednisone Rash and Anxiety    Medications: Outpatient Encounter Medications as of 10/20/2020  Medication Sig Note   amitriptyline (ELAVIL) 25 MG tablet Take 1 tablet (25 mg total) by mouth at bedtime.    amoxicillin (AMOXIL) 875 MG tablet Take 1 tablet (875 mg total) by mouth 2 (two) times daily.    atorvastatin (LIPITOR) 40 MG tablet Take 1 tablet (40 mg total) by mouth at bedtime.    busPIRone (BUSPAR) 10 MG tablet Take 1 tablet (10 mg total) by mouth 3 (three) times daily.    cyclobenzaprine (FLEXERIL) 5 MG tablet Take 5 mg by mouth 3 (three) times daily as needed for muscle spasms.    DULoxetine (CYMBALTA) 60 MG capsule Take 2 capsules (120 mg total) by mouth daily.    esomeprazole (NEXIUM) 40 MG capsule Take 40 mg by mouth 2 (two) times daily before a meal.  07/31/2020: Gets from [place] 5-325 MG tablet Take 1 tablet by mouth every 6 (six) hours as needed for moderate pain.    HYDROcodone-homatropine (HYCODAN) 5-1.5 MG/5ML syrup Take 5 mLs by mouth 2 (two) times daily as needed for cough.    hydrOXYzine (ATARAX/VISTARIL) 25 MG tablet Take 1 tablet (25 mg total) by mouth at bedtime as needed.    levothyroxine (SYNTHROID) 125 MCG tablet 1 tab daily as directed except no dose on Sunday    losartan (COZAAR) 100 MG tablet Take 1 tablet (100 mg total) by mouth  every morning.    Magnesium 400 MG CAPS Take 400 mg by mouth daily. Reported on 02/13/2016    meloxicam (MOBIC) 15 MG tablet Take 1 tablet (15 mg total) by mouth daily as needed for pain.    Methen-Hyosc-Meth Blue-Na Phos (UROGESIC-BLUE) 81.6 MG TABS Take 1 tablet (81.6 mg total) by mouth 4 (four) times daily as needed.    metoprolol succinate (TOPROL-XL) 100 MG 24 hr tablet Take 1 tablet (100 mg total) by mouth every morning.    metoprolol succinate (TOPROL-XL) 50 MG 24 hr tablet Take 1 tablet (50 mg total) by mouth every morning.    Misc Natural Products (FIBER 7 PO) Take by mouth.    nitrofurantoin (MACRODANTIN) 50 MG capsule Take 50 mg by mouth daily.     ondansetron (ZOFRAN) 4 MG tablet Take 4 mg by mouth every 8 (eight) hours as needed for nausea or vomiting.    rizatriptan (MAXALT) 10 MG tablet Take 10 mg by mouth as needed for migraine. May repeat in 2 hours if needed    topiramate (TOPAMAX) 100 MG tablet Take 1 tablet (100 mg total) by mouth 2 (two) times daily.    No facility-administered encounter medications on file as of 10/20/2020.    Current Diagnosis: Patient Active Problem List   Diagnosis Date Noted   Vitamin D  deficiency 11/18/2019   Headache 07/25/2019   Educated about COVID-19 virus infection 05/23/2019   Chronic low back pain with left-sided sciatica 05/17/2018   Urinary frequency 05/17/2018   Fatigue 05/14/2018   Interstitial cystitis 05/14/2018   Hair loss disorder 06/03/2017   Shoulder pain 06/03/2017   Preventative health care 02/03/2017   Depression with anxiety 06/29/2016   Thyroiditis, subacute 06/04/2016   UTI (urinary tract infection) 02/11/2016   Cervical cancer screening 09/07/2015   Cough 08/13/2015   Insomnia 08/13/2015   Acute stress reaction 04/25/2015   Fibromyalgia    Lesion of lung    Allergy    GERD (gastroesophageal reflux disease)    Hyperlipidemia    Hypertension    Migraine    Hyperplastic colon polyp    Atrial fibrillation (HCC)     Hx of blood clots    Obesity    History of chicken pox     Goals Addressed   None    Reviewed chart for medication changes ahead of medication coordination call.  No OVs, Consults, or hospital visits since last care coordination call  No medication changes indicated.  BP Readings from Last 3 Encounters:  07/27/20 124/74  03/08/20 127/73  02/22/20 118/77    Lab Results  Component Value Date   HGBA1C 5.2 07/27/2020     Patient obtains medications through Adherence Packaging  90 Days   Last adherence delivery included:  Cyclobenzaprine 5 mg tab twice a day as needed for muscle spasms.  Patient declined the following medications due to 90-day supply on 07-31-20. Buspirone 10 mg; one tab at Breakfast, one at Lunch, one at Bedtime Amitriptyline 25 mg; one tab at Bedtime Hydroxyzine Hcl 25 mg; two tabs at Bedtime Losartan 100 mg; one tab at Breakfast Levothyroxine 125 mcg; one tab before Breakfast EXCEPT SUNDAY Duloxetine 60 mg; two tabs at Breakfast Meloxicam 15 mg; one tab at Breakfast Atorvastatin 40 mg; one tab at Bedtime Metoprolol Succinate 50 mg; one tab with Breakfast Metoprolol Succinate 100 mg; one tab at Breakfast Topiramate 100 mg; one tab at Breakfast, one at Bedtime  Patient is not due for anadherence delivery at this time: . Called patient and reviewed medications.  Patient declined but is still taking the following medications: Buspirone 10 mg; one tab at Breakfast, one at Lunch, one at Bedtime (changed, use to take another tab at [facility]) Amitriptyline 25 mg; one tab at Bedtime Hydroxyzine Hcl 25 mg; two tabs at Bedtime Losartan 100 mg; one tab at Breakfast Levothyroxine 125 mcg; one tab before Breakfast EXCEPT SUNDAY Duloxetine 60 mg; two tabs at Breakfast Meloxicam 15 mg; one tab at Breakfast Atorvastatin 40 mg; one tab at Bedtime Metoprolol Succinate 50 mg; one tab with Breakfast Metoprolol Succinate 100 mg; one tab at Breakfast Topiramate 100 mg;  one tab at Breakfast, one at Bedtime   Patient does not need any refills. Follow-Up:  Pharmacist Review   Fanny Skates, [place] Pharmacist Assistant [phone]  3 minutes spent in review, coordination, and documentation.  Reviewed by: Beverly Milch, PharmD Clinical Pharmacist [facility] [phone]

## 2020-10-30 ENCOUNTER — Telehealth: Payer: Self-pay | Admitting: Pharmacist

## 2020-10-30 ENCOUNTER — Telehealth: Payer: Self-pay | Admitting: *Deleted

## 2020-10-30 MED ORDER — TOPIRAMATE 100 MG PO TABS: 100.0000 mg | ORAL_TABLET | Freq: Two times a day (BID) | ORAL | 1 refills | Status: DC

## 2020-10-30 NOTE — Telephone Encounter (Signed)
-----   Message from Lafonda Mosses sent at 10/30/2020 11:31 AM EST ----- Regarding: Medication Refill Request [place]. I hope that you are well and staying safe. Would you please send the following medication to [facility] for an urgent refill. Patient does need a delivery today. topiramate (TOPAMAX) 100 MG tablet  Thank you! Fanny Skates, [place] Pharmacist Assistant [phone]

## 2020-10-30 NOTE — Telephone Encounter (Signed)
[facility].

## 2020-10-30 NOTE — Progress Notes (Addendum)
Chronic Care Management Pharmacy Assistant   Name: ZAARA SPROWL  MRN: [id number] DOB: December 25, 1955  Reason for Encounter: Medication Review   PCP : [name], MD  Allergies:   Allergies  Allergen Reactions   Ciprofloxacin Rash and Shortness Of Breath   Sulfa Antibiotics Hives   Sulfasalazine Hives   Cortisone Other (See Comments)    Red flush and anxiety   Tramadol Other (See Comments)    headache   Zonisamide Other (See Comments)    unk   Erythromycin Rash   Erythromycin Base Rash   Levofloxacin Nausea Only   Lisinopril     Other reaction(s): Cough (ALLERGY/intolerance)   Prednisone Rash and Anxiety    Medications: Outpatient Encounter Medications as of 10/30/2020  Medication Sig Note   amitriptyline (ELAVIL) 25 MG tablet Take 1 tablet (25 mg total) by mouth at bedtime.    amoxicillin (AMOXIL) 875 MG tablet Take 1 tablet (875 mg total) by mouth 2 (two) times daily.    atorvastatin (LIPITOR) 40 MG tablet Take 1 tablet (40 mg total) by mouth at bedtime.    busPIRone (BUSPAR) 10 MG tablet Take 1 tablet (10 mg total) by mouth 3 (three) times daily.    cyclobenzaprine (FLEXERIL) 5 MG tablet Take 5 mg by mouth 3 (three) times daily as needed for muscle spasms.    DULoxetine (CYMBALTA) 60 MG capsule Take 2 capsules (120 mg total) by mouth daily.    esomeprazole (NEXIUM) 40 MG capsule Take 40 mg by mouth 2 (two) times daily before a meal.  07/31/2020: Gets from [place] 5-325 MG tablet Take 1 tablet by mouth every 6 (six) hours as needed for moderate pain.    HYDROcodone-homatropine (HYCODAN) 5-1.5 MG/5ML syrup Take 5 mLs by mouth 2 (two) times daily as needed for cough.    hydrOXYzine (ATARAX/VISTARIL) 25 MG tablet Take 1 tablet (25 mg total) by mouth at bedtime as needed.    levothyroxine (SYNTHROID) 125 MCG tablet 1 tab daily as directed except no dose on Sunday    losartan (COZAAR) 100 MG tablet Take 1 tablet (100 mg total) by mouth  every morning.    Magnesium 400 MG CAPS Take 400 mg by mouth daily. Reported on 02/13/2016    meloxicam (MOBIC) 15 MG tablet Take 1 tablet (15 mg total) by mouth daily as needed for pain.    Methen-Hyosc-Meth Blue-Na Phos (UROGESIC-BLUE) 81.6 MG TABS Take 1 tablet (81.6 mg total) by mouth 4 (four) times daily as needed.    metoprolol succinate (TOPROL-XL) 100 MG 24 hr tablet Take 1 tablet (100 mg total) by mouth every morning.    metoprolol succinate (TOPROL-XL) 50 MG 24 hr tablet Take 1 tablet (50 mg total) by mouth every morning.    Misc Natural Products (FIBER 7 PO) Take by mouth.    nitrofurantoin (MACRODANTIN) 50 MG capsule Take 50 mg by mouth daily.     ondansetron (ZOFRAN) 4 MG tablet Take 4 mg by mouth every 8 (eight) hours as needed for nausea or vomiting.    rizatriptan (MAXALT) 10 MG tablet Take 10 mg by mouth as needed for migraine. May repeat in 2 hours if needed    topiramate (TOPAMAX) 100 MG tablet Take 1 tablet (100 mg total) by mouth 2 (two) times daily.    No facility-administered encounter medications on file as of 10/30/2020.    Current Diagnosis: Patient Active Problem List   Diagnosis Date Noted   Vitamin D  deficiency 11/18/2019   Headache 07/25/2019   Educated about COVID-19 virus infection 05/23/2019   Chronic low back pain with left-sided sciatica 05/17/2018   Urinary frequency 05/17/2018   Fatigue 05/14/2018   Interstitial cystitis 05/14/2018   Hair loss disorder 06/03/2017   Shoulder pain 06/03/2017   Preventative health care 02/03/2017   Depression with anxiety 06/29/2016   Thyroiditis, subacute 06/04/2016   UTI (urinary tract infection) 02/11/2016   Cervical cancer screening 09/07/2015   Cough 08/13/2015   Insomnia 08/13/2015   Acute stress reaction 04/25/2015   Fibromyalgia    Lesion of lung    Allergy    GERD (gastroesophageal reflux disease)    Hyperlipidemia    Hypertension    Migraine    Hyperplastic colon polyp    Atrial fibrillation (HCC)     Hx of blood clots    Obesity    History of chicken pox     Goals Addressed   None    Reviewed chart for medication changes ahead of medication coordination call.  No OVs, Consults, or hospital visits since last care coordination call.  No medication changes indicated.  BP Readings from Last 3 Encounters:  07/27/20 124/74  03/08/20 127/73  02/22/20 118/77    Lab Results  Component Value Date   HGBA1C 5.2 07/27/2020     Patient obtains medications through Adherence Packaging  90 Days   Last adherence delivery included:  Cyclobenzaprine 5 mg tab twice a day as needed for muscle spasms.  Patient declined the following medications last month due to 90-day supply on 07-31-20. Buspirone 10 mg; one tab at Breakfast, one at Lunch, one at Bedtime Amitriptyline 25 mg; one tab at Bedtime Hydroxyzine Hcl 25 mg; two tabs at Bedtime Losartan 100 mg; one tab at Breakfast Levothyroxine 125 mcg; one tab before Breakfast EXCEPT SUNDAY Duloxetine 60 mg; two tabs at Breakfast Meloxicam 15 mg; one tab at Breakfast Atorvastatin 40 mg; one tab at Bedtime Metoprolol Succinate 50 mg; one tab with Breakfast Metoprolol Succinate 100 mg; one tab at Breakfast Topiramate 100 mg; one tab at Breakfast, one at Bedtime   Patient called me and stated she will run out of medication today and requires an adherence delivery. Reviewed medications and coordinated delivery.  This delivery to include: Buspirone 10 mg; one tab at [facility] at Bedtime Amitriptyline 25 mg; one tab at Bedtime Hydroxyzine Hcl 25 mg; two tabs at Bedtime Losartan 100 mg; one tab at Breakfast Levothyroxine 125 mcg; one tab before Breakfast  Duloxetine 60 mg; two tabs at Breakfast Meloxicam 15 mg; one tab at Breakfast Atorvastatin 40 mg; one tab at Bedtime Metoprolol Succinate 50 mg; one tab with Breakfast Metoprolol Succinate 100 mg; one tab at Breakfast Topiramate 100 mg; one tab at Breakfast, one at  Bedtime   Patient declined the following medication due to enough on hand: Cyclobenzaprine 5 mg tab twice a day as needed for muscle spasms.  Patient needs refills for Topamax 100 mg. A request has been sent to PCP for refill.  Confirmed delivery date of 10-30-2020, advised patient that pharmacy will contact them the morning of delivery.  Follow-Up:  Coordination of Enhanced Pharmacy Services and Pharmacist Review   Fanny Skates, [place] Pharmacist Assistant [phone]  7 minutes spent in review, coordination, and documentation.  Reviewed by: Beverly Milch, PharmD Clinical Pharmacist [facility] [phone]

## 2020-10-30 NOTE — Telephone Encounter (Cosign Needed)
error 

## 2020-11-01 ENCOUNTER — Telehealth: Payer: Self-pay | Admitting: *Deleted

## 2020-11-01 DIAGNOSIS — E061 Subacute thyroiditis: Secondary | ICD-10-CM

## 2020-11-01 MED ORDER — BUSPIRONE HCL 10 MG PO TABS: 10.0000 mg | ORAL_TABLET | Freq: Two times a day (BID) | ORAL | 1 refills | Status: DC

## 2020-11-01 MED ORDER — BUSPIRONE HCL 10 MG PO TABS: 10.0000 mg | ORAL_TABLET | Freq: Three times a day (TID) | ORAL | 1 refills | Status: DC

## 2020-11-01 MED ORDER — LEVOTHYROXINE SODIUM 125 MCG PO TABS: 125.0000 ug | ORAL_TABLET | Freq: Every day | ORAL | 1 refills | Status: DC

## 2020-11-01 NOTE — Telephone Encounter (Signed)
-----   Message from Edythe Clarity, [facility] sent at 10/30/2020 11:59 AM EST ----- Hi!  I am helping take care of Kanesha's patients during this transition.  Patient informed us that she was only taking buspirone twice daily now instead of tid.  If this is the correct instructions, can we get a new rx for twice daily buspirone sent in to [facility] to keep her prescriptions records up to date for adherence purposes.  Thanks!  Beverly Milch, PharmD Clinical Pharmacist [place] [phone]

## 2020-11-01 NOTE — Telephone Encounter (Signed)
I have tried to look back to see if this was a change but I did not see it.  Our med list have it as tid.  Do you know if her buspirone is bid or tid?  Patient stated that she is taking bid now.

## 2020-11-01 NOTE — Telephone Encounter (Signed)
I am fine with either bid or tid on this one. So we can go with the bid if that is working for her.

## 2020-11-01 NOTE — Telephone Encounter (Signed)
rx sent in for bid.

## 2020-11-01 NOTE — Telephone Encounter (Signed)
Medication list updated and [facility].

## 2020-11-01 NOTE — Telephone Encounter (Signed)
-----   Message from Edythe Clarity, [facility] sent at 10/30/2020  1:08 PM EST ----- Sorry for the duplicate message, she also stated she was taking her levothyroxine daily now.  Her old Rx said to skip Sunday's dose.  If this is correct can you please send and updated prescription with new directions to [facility] for delivery today?  Thank you so much!  Beverly Milch, PharmD Clinical Pharmacist [place] [phone]

## 2020-11-01 NOTE — Addendum Note (Signed)
Addended by: Kem Boroughs D on: 11/01/2020 12:05 PM   Modules accepted: Orders

## 2020-11-02 NOTE — Addendum Note (Signed)
Addended by: CHART CORRECTION ANALYST THREE, IDENTITY on: 11/02/2020 11:54 AM   Modules accepted: Level of Service, SmartSet

## 2020-11-02 NOTE — Telephone Encounter (Signed)
This encounter was created in error - please disregard.

## 2020-11-05 ENCOUNTER — Encounter: Payer: Self-pay | Admitting: Family Medicine

## 2020-11-06 MED ORDER — HYDROCODONE-ACETAMINOPHEN 5-325 MG PO TABS: 1.0000 | ORAL_TABLET | Freq: Four times a day (QID) | ORAL | 0 refills | Status: DC | PRN

## 2020-11-06 NOTE — Telephone Encounter (Signed)
Requesting: hydrocodone  Contract: 02/17/2020 UDS: 02/17/2020 Last Visit: 09/05/20 w/ Larose [place] Next Visit: 12/14/20 Last Refill: 08/28/2020 #60 and 0RF  Please Advise

## 2020-11-19 DIAGNOSIS — Z95818 Presence of other cardiac implants and grafts: Secondary | ICD-10-CM | POA: Diagnosis not present

## 2020-11-19 DIAGNOSIS — Z4509 Encounter for adjustment and management of other cardiac device: Secondary | ICD-10-CM | POA: Diagnosis not present

## 2020-11-19 DIAGNOSIS — I4891 Unspecified atrial fibrillation: Secondary | ICD-10-CM | POA: Diagnosis not present

## 2020-11-20 ENCOUNTER — Telehealth: Payer: Self-pay | Admitting: Pharmacist

## 2020-11-20 NOTE — Progress Notes (Addendum)
Chronic Care Management Pharmacy Assistant   Name: Alexandra Patterson  MRN: [id number] DOB: Aug 22, 1956  Reason for Encounter: Medication Review  PCP : [name], MD  Allergies:   Allergies  Allergen Reactions   Ciprofloxacin Rash and Shortness Of Breath   Sulfa Antibiotics Hives   Sulfasalazine Hives   Cortisone Other (See Comments)    Red flush and anxiety   Tramadol Other (See Comments)    headache   Zonisamide Other (See Comments)    unk   Erythromycin Rash   Erythromycin Base Rash   Levofloxacin Nausea Only   Lisinopril     Other reaction(s): Cough (ALLERGY/intolerance)   Prednisone Rash and Anxiety    Medications: Outpatient Encounter Medications as of 11/20/2020  Medication Sig Note   amitriptyline (ELAVIL) 25 MG tablet Take 1 tablet (25 mg total) by mouth at bedtime.    amoxicillin (AMOXIL) 875 MG tablet Take 1 tablet (875 mg total) by mouth 2 (two) times daily.    atorvastatin (LIPITOR) 40 MG tablet Take 1 tablet (40 mg total) by mouth at bedtime.    busPIRone (BUSPAR) 10 MG tablet Take 1 tablet (10 mg total) by mouth 2 (two) times daily.    cyclobenzaprine (FLEXERIL) 5 MG tablet Take 5 mg by mouth 3 (three) times daily as needed for muscle spasms.    DULoxetine (CYMBALTA) 60 MG capsule Take 2 capsules (120 mg total) by mouth daily.    esomeprazole (NEXIUM) 40 MG capsule Take 40 mg by mouth 2 (two) times daily before a meal.  07/31/2020: Gets from [place] 5-325 MG tablet Take 1 tablet by mouth every 6 (six) hours as needed for moderate pain.    HYDROcodone-homatropine (HYCODAN) 5-1.5 MG/5ML syrup Take 5 mLs by mouth 2 (two) times daily as needed for cough.    hydrOXYzine (ATARAX/VISTARIL) 25 MG tablet Take 1 tablet (25 mg total) by mouth at bedtime as needed.    levothyroxine (SYNTHROID) 125 MCG tablet Take 1 tablet (125 mcg total) by mouth daily before breakfast.    losartan (COZAAR) 100 MG tablet Take 1 tablet (100 mg total) by  mouth every morning.    Magnesium 400 MG CAPS Take 400 mg by mouth daily. Reported on 02/13/2016    meloxicam (MOBIC) 15 MG tablet Take 1 tablet (15 mg total) by mouth daily as needed for pain.    Methen-Hyosc-Meth Blue-Na Phos (UROGESIC-BLUE) 81.6 MG TABS Take 1 tablet (81.6 mg total) by mouth 4 (four) times daily as needed.    metoprolol succinate (TOPROL-XL) 100 MG 24 hr tablet Take 1 tablet (100 mg total) by mouth every morning.    metoprolol succinate (TOPROL-XL) 50 MG 24 hr tablet Take 1 tablet (50 mg total) by mouth every morning.    Misc Natural Products (FIBER 7 PO) Take by mouth.    nitrofurantoin (MACRODANTIN) 50 MG capsule Take 50 mg by mouth daily.     ondansetron (ZOFRAN) 4 MG tablet Take 4 mg by mouth every 8 (eight) hours as needed for nausea or vomiting.    rizatriptan (MAXALT) 10 MG tablet Take 10 mg by mouth as needed for migraine. May repeat in 2 hours if needed    topiramate (TOPAMAX) 100 MG tablet Take 1 tablet (100 mg total) by mouth 2 (two) times daily.    No facility-administered encounter medications on file as of 11/20/2020.    Current Diagnosis: Patient Active Problem List   Diagnosis Date Noted   Vitamin D  deficiency 11/18/2019   Headache 07/25/2019   Educated about COVID-19 virus infection 05/23/2019   Chronic low back pain with left-sided sciatica 05/17/2018   Urinary frequency 05/17/2018   Fatigue 05/14/2018   Interstitial cystitis 05/14/2018   Hair loss disorder 06/03/2017   Shoulder pain 06/03/2017   Preventative health care 02/03/2017   Depression with anxiety 06/29/2016   Thyroiditis, subacute 06/04/2016   UTI (urinary tract infection) 02/11/2016   Cervical cancer screening 09/07/2015   Cough 08/13/2015   Insomnia 08/13/2015   Acute stress reaction 04/25/2015   Fibromyalgia    Lesion of lung    Allergy    GERD (gastroesophageal reflux disease)    Hyperlipidemia    Hypertension    Migraine    Hyperplastic colon polyp    Atrial fibrillation  (HCC)    Hx of blood clots    Obesity    History of chicken pox     Goals Addressed   None    Reviewed chart for medication changes ahead of medication coordination call.  No OVs, Consults, or hospital visits since last care coordination call.  No medication changes indicated.  BP Readings from Last 3 Encounters:  07/27/20 124/74  03/08/20 127/73  02/22/20 118/77    Lab Results  Component Value Date   HGBA1C 5.2 07/27/2020     Patient obtains medications through Adherence Packaging  90 Days   Last adherence delivery included: Buspirone 10 mg; one tab at [facility] at Bedtime Amitriptyline 25 mg; one tab at Bedtime Hydroxyzine Hcl 25 mg; two tabs at Bedtime Losartan 100 mg; one tab at Breakfast Levothyroxine 125 mcg; one tab before Breakfast  Duloxetine 60 mg; two tabs at Breakfast Meloxicam 15 mg; one tab at Breakfast Atorvastatin 40 mg; one tab at Bedtime Metoprolol Succinate 50 mg; one tab with Breakfast Metoprolol Succinate 100 mg; one tab at Breakfast Topiramate 100 mg; one tab at Breakfast, one at Bedtime  Patient declined meds last month: (enough on hand) Cyclobenzaprine 5 mg tab twice a day as needed for muscle spasms.  Patient is not due for an adherence delivery at this time.  Called patient and reviewed medications and coordinated delivery.  This delivery to include: None  Patient declined the following medications: Buspirone 10 mg; one tab at [facility] at Bedtime Amitriptyline 25 mg; one tab at Bedtime Hydroxyzine Hcl 25 mg; two tabs at Bedtime Losartan 100 mg; one tab at Breakfast Levothyroxine 125 mcg; one tab before Breakfast  Duloxetine 60 mg; two tabs at Breakfast Meloxicam 15 mg; one tab at Breakfast Atorvastatin 40 mg; one tab at Bedtime Metoprolol Succinate 50 mg; one tab with Breakfast Metoprolol Succinate 100 mg; one tab at Breakfast Topiramate 100 mg; one tab at Breakfast, one at Bedtime  Patient does not need refills any  refills at this time.    Follow-Up:  Coordination of Enhanced Pharmacy Services and Pharmacist Review   Charlann Lange, [place] Pharmacist Assistant [phone]  10 minutes spent in review, coordination, and documentation.  Reviewed by: Beverly Milch, PharmD Clinical Pharmacist [facility] [phone]

## 2020-11-27 ENCOUNTER — Telehealth: Payer: Self-pay | Admitting: Pharmacist

## 2020-11-27 DIAGNOSIS — Z4509 Encounter for adjustment and management of other cardiac device: Secondary | ICD-10-CM | POA: Diagnosis not present

## 2020-11-27 DIAGNOSIS — I4891 Unspecified atrial fibrillation: Secondary | ICD-10-CM | POA: Diagnosis not present

## 2020-11-27 NOTE — Progress Notes (Signed)
    Chronic Care Management Pharmacy Assistant   Name: Alexandra Patterson  MRN: [id number] DOB: 02-19-1956  Reason for Encounter: Adherence Review  PCP : [name], MD  Verified Adherence Gap Information. Per insurance data, the patient is 100% compliant with the CHOL (Atorvastatin) and HTN (Losartan) medication. Per insurance data patient has met their wellness bundle screening but did not met their annual wellness visit screening. Their most recent A1C was 5.2 on 07/27/20. Their most recent blood pressure was 124/74 on 07/27/20. The patient met goal by keeping their blood pressure below 140/90. The patients total gaps-all measures is equal to 1.   Follow-Up:  Pharmacist Review   Charlann Lange, [place] Pharmacist Assistant [phone]

## 2020-12-07 DIAGNOSIS — G43009 Migraine without aura, not intractable, without status migrainosus: Secondary | ICD-10-CM | POA: Diagnosis not present

## 2020-12-07 DIAGNOSIS — M79605 Pain in left leg: Secondary | ICD-10-CM | POA: Diagnosis not present

## 2020-12-07 DIAGNOSIS — M79604 Pain in right leg: Secondary | ICD-10-CM | POA: Diagnosis not present

## 2020-12-14 ENCOUNTER — Encounter: Payer: Self-pay | Admitting: Family Medicine

## 2020-12-14 ENCOUNTER — Other Ambulatory Visit: Payer: Self-pay

## 2020-12-14 ENCOUNTER — Ambulatory Visit (INDEPENDENT_AMBULATORY_CARE_PROVIDER_SITE_OTHER): Payer: PPO | Admitting: Family Medicine

## 2020-12-14 VITALS — BP 128/72 | HR 85 | Resp 17 | Ht 67.0 in | Wt 261.0 lb

## 2020-12-14 DIAGNOSIS — M25552 Pain in left hip: Secondary | ICD-10-CM

## 2020-12-14 DIAGNOSIS — I4891 Unspecified atrial fibrillation: Secondary | ICD-10-CM

## 2020-12-14 DIAGNOSIS — K2960 Other gastritis without bleeding: Secondary | ICD-10-CM | POA: Diagnosis not present

## 2020-12-14 DIAGNOSIS — R300 Dysuria: Secondary | ICD-10-CM

## 2020-12-14 DIAGNOSIS — E559 Vitamin D deficiency, unspecified: Secondary | ICD-10-CM

## 2020-12-14 DIAGNOSIS — Z6836 Body mass index (BMI) 36.0-36.9, adult: Secondary | ICD-10-CM

## 2020-12-14 DIAGNOSIS — K219 Gastro-esophageal reflux disease without esophagitis: Secondary | ICD-10-CM | POA: Diagnosis not present

## 2020-12-14 DIAGNOSIS — I10 Essential (primary) hypertension: Secondary | ICD-10-CM

## 2020-12-14 DIAGNOSIS — E785 Hyperlipidemia, unspecified: Secondary | ICD-10-CM | POA: Diagnosis not present

## 2020-12-14 DIAGNOSIS — G8929 Other chronic pain: Secondary | ICD-10-CM

## 2020-12-14 DIAGNOSIS — M5442 Lumbago with sciatica, left side: Secondary | ICD-10-CM | POA: Diagnosis not present

## 2020-12-14 DIAGNOSIS — F418 Other specified anxiety disorders: Secondary | ICD-10-CM

## 2020-12-14 MED ORDER — HYDROCODONE-ACETAMINOPHEN 7.5-325 MG PO TABS: 1.0000 | ORAL_TABLET | Freq: Four times a day (QID) | ORAL | 0 refills | Status: DC | PRN

## 2020-12-14 MED ORDER — FAMOTIDINE 40 MG PO TABS: 40.0000 mg | ORAL_TABLET | Freq: Every evening | ORAL | 3 refills | Status: DC | PRN

## 2020-12-14 NOTE — Assessment & Plan Note (Signed)
Well controlled, no changes to meds. Encouraged heart healthy diet such as the DASH diet and exercise as tolerated.  °

## 2020-12-14 NOTE — Assessment & Plan Note (Signed)
Supplement and monitor 

## 2020-12-14 NOTE — Patient Instructions (Addendum)
Magnesium glycinate 200-400 mg nightly for sleep  MIND diet   Hip Pain The hip is the joint between the upper legs and the lower pelvis. The bones, cartilage, tendons, and muscles of your hip joint support your body and allow you to move around. Hip pain can range from a minor ache to severe pain in one or both of your hips. The pain may be felt on the inside of the hip joint near the groin, or on the outside near the buttocks and upper thigh. You may also have swelling or stiffness in your hip area. Follow these instructions at home: Managing pain, stiffness, and swelling  If directed, put ice on the painful area. To do this: ? Put ice in a plastic bag. ? Place a towel between your skin and the bag. ? Leave the ice on for 20 minutes, 2-3 times a day.  If directed, apply heat to the affected area as often as told by your health care provider. Use the heat source that your health care provider recommends, such as a moist heat pack or a heating pad. ? Place a towel between your skin and the heat source. ? Leave the heat on for 20-30 minutes. ? Remove the heat if your skin turns bright red. This is especially important if you are unable to feel pain, heat, or cold. You may have a greater risk of getting burned.      Activity  Do exercises as told by your health care provider.  Avoid activities that cause pain. General instructions  Take over-the-counter and prescription medicines only as told by your health care provider.  Keep a journal of your symptoms. Write down: ? How often you have hip pain. ? The location of your pain. ? What the pain feels like. ? What makes the pain worse.  Sleep with a pillow between your legs on your most comfortable side.  Keep all follow-up visits as told by your health care provider. This is important.   Contact a health care provider if:  You cannot put weight on your leg.  Your pain or swelling continues or gets worse after one week.  It gets  harder to walk.  You have a fever. Get help right away if:  You fall.  You have a sudden increase in pain and swelling in your hip.  Your hip is red or swollen or very tender to touch. Summary  Hip pain can range from a minor ache to severe pain in one or both of your hips.  The pain may be felt on the inside of the hip joint near the groin, or on the outside near the buttocks and upper thigh.  Avoid activities that cause pain.  Write down how often you have hip pain, the location of the pain, what makes it worse, and what it feels like. This information is not intended to replace advice given to you by your health care provider. Make sure you discuss any questions you have with your health care provider. Document Revised: 02/01/2019 Document Reviewed: 02/01/2019 Elsevier Patient Education  [place].

## 2020-12-14 NOTE — Assessment & Plan Note (Signed)
Encouraged moist heat and gentle stretching as tolerated. May try NSAIDs and prescription meds as directed and report if symptoms worsen or seek immediate care. Is following with ortho and had some PT but she feels it only helped al ittle, is not hurting now as much as it was. Encouraged to return to discuss options. Consider topical treatments such aspercreme or lidocaine topically

## 2020-12-14 NOTE — Assessment & Plan Note (Signed)
Encouraged heart healthy diet, increase exercise, avoid trans fats, consider a krill oil cap daily 

## 2020-12-15 LAB — URINALYSIS
Bilirubin Urine: NEGATIVE
Hgb urine dipstick: NEGATIVE
Ketones, ur: NEGATIVE
Leukocytes,Ua: NEGATIVE
Nitrite: NEGATIVE
Specific Gravity, Urine: 1.01 (ref 1.000–1.030)
Total Protein, Urine: NEGATIVE
Urine Glucose: NEGATIVE
Urobilinogen, UA: 0.2 (ref 0.0–1.0)
pH: 6.5 (ref 5.0–8.0)

## 2020-12-15 LAB — URINE CULTURE
MICRO NUMBER:: 11659792
Result:: NO GROWTH
SPECIMEN QUALITY:: ADEQUATE

## 2020-12-15 LAB — LIPID PANEL
Cholesterol: 182 mg/dL (ref 0–200)
HDL: 72.3 mg/dL (ref >39.00)
LDL Cholesterol: 84 mg/dL (ref 0–99)
NonHDL: 109.91
Total CHOL/HDL Ratio: 3
Triglycerides: 132 mg/dL (ref 0.0–149.0)
VLDL: 26.4 mg/dL (ref 0.0–40.0)

## 2020-12-15 LAB — TSH: TSH: 0.23 u[IU]/mL — ABNORMAL LOW (ref 0.35–4.50)

## 2020-12-15 LAB — CBC
HCT: 42.3 % (ref 36.0–46.0)
Hemoglobin: 14.3 g/dL (ref 12.0–15.0)
MCHC: 33.8 g/dL (ref 30.0–36.0)
MCV: 90.9 fl (ref 78.0–100.0)
Platelets: 185 10*3/uL (ref 150.0–400.0)
RBC: 4.65 Mil/uL (ref 3.87–5.11)
RDW: 14.2 % (ref 11.5–15.5)
WBC: 6.9 10*3/uL (ref 4.0–10.5)

## 2020-12-15 LAB — COMPREHENSIVE METABOLIC PANEL
ALT: 14 U/L (ref 0–35)
AST: 14 U/L (ref 0–37)
Albumin: 4 g/dL (ref 3.5–5.2)
Alkaline Phosphatase: 71 U/L (ref 39–117)
BUN: 22 mg/dL (ref 6–23)
CO2: 25 mEq/L (ref 19–32)
Calcium: 9.4 mg/dL (ref 8.4–10.5)
Chloride: 103 mEq/L (ref 96–112)
Creatinine, Ser: 1.1 mg/dL (ref 0.40–1.20)
GFR: 52.68 mL/min — ABNORMAL LOW (ref >60.00)
Glucose, Bld: 93 mg/dL (ref 70–99)
Potassium: 4.6 mEq/L (ref 3.5–5.1)
Sodium: 138 mEq/L (ref 135–145)
Total Bilirubin: 0.6 mg/dL (ref 0.2–1.2)
Total Protein: 6.1 g/dL (ref 6.0–8.3)

## 2020-12-15 LAB — VITAMIN D 25 HYDROXY (VIT D DEFICIENCY, FRACTURES): VITD: 82.56 ng/mL (ref 30.00–100.00)

## 2020-12-15 NOTE — Progress Notes (Signed)
Subjective:    Patient ID: Alexandra Patterson, female    DOB: 1956-05-06, 65 y.o.   MRN: [id number]  Chief Complaint  Patient presents with  . Hypertension  . Appetite concern    severe craving of sweets    HPI Patient is in today for follow up on chronic medical concerns. No recent febrile illness or hospitalizations. She is noting weight gain and very frustrated. She has been juggling numerous stressors including her husband recently retiring from work and being home all of the time. She is noting an increase in her reflux with her weight gain despite takng Nexium twice a day. Denies CP/palp/SOB/HA/congestion/fevers/GI or GU c/o. Taking meds as prescribed  Past Medical History:  Diagnosis Date  . Allergy   . Anxiety   . Atrial fibrillation ([place])   . Back pain   . Bilateral swelling of feet   . Bursitis   . Cervical cancer screening 09/07/2015   Menarche at 11 Irregular and heavy and painful flow secondary to fibroids No history of abnormal pap in past, last pap roughly 2003 G2P2, s/p 2 SVD history of abnormal MGM, 1 abnl bx right breast benign, normal otherwise Noconcerns today TAH b/l SPO for fibroids and migrainesand breast bx on right gyn surgeries  . Constipation   . Cough 08/13/2015  . Depression   . Depression with anxiety 06/29/2016  . Fainting    once to due heart out of rythm  . Fibromyalgia   . Frequent headaches   . GERD (gastroesophageal reflux disease)   . 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 Heartburn   . History of blood clots   . History of chicken pox   . Hx of blood clots    dvt  . Hyperlipidemia   . Hyperplastic colon polyp   . Hypertension   . Hypothyroid   . Insomnia 08/13/2015  . Internal hemorrhoids   . Interstitial cystitis   . Joint pain   . Lesion of lung    xray in 2014 thought to be benign seen at [facility] pulmonology  . Measles    h/o  . Migraine   . Obesity   . Osteoarthritis   . Preventative health care 02/03/2017  . Raynaud's disease   .  S/P AVR (aortic valve replacement) 03/19/2015  . Shoulder pain 06/03/2017  . UTI (lower urinary tract infection)   . UTI (urinary tract infection) 02/11/2016    Past Surgical History:  Procedure Laterality Date  . ABDOMINAL HYSTERECTOMY     TAH SPO  . AORTIC VALVE REPLACEMENT  2014   with [facility]  . ATRIAL ABLATION SURGERY    . BLADDER SUSPENSION    . BREAST BIOPSY Right    Needle Biopsy  . DILATION AND CURETTAGE OF UTERUS     x 3  . loop heart    . SHOULDER SURGERY Left    arthroscopy for spurs  . TONSILLECTOMY    . TUBAL LIGATION      Family History  Problem Relation Age of Onset  . Colon polyps Father   . Alzheimer's disease Father   . Alcohol abuse Father   . Cancer Father        colon cancer  . Arthritis Mother        s/p TKR  . Neuropathy Mother   . Hyperlipidemia Mother   . Other Mother        familial mediterranean fever  . Thyroid disease Mother   . Hypertension Mother   .  Obesity Mother   . Heart disease Mother   . Cancer Brother        prostate cancer  . Heart disease Brother        cardiomegaly  . Other Brother        familial mediterranean fever  . Asthma Maternal Grandmother   . Congestive Heart Failure Maternal Grandmother   . Heart disease Maternal Grandmother        chf  . Stroke Maternal Grandfather   . Diabetes Maternal Grandfather   . Heart disease Maternal Grandfather        hardening of the arteries  . Stroke Paternal Grandfather   . Atrial fibrillation Paternal Grandfather   . Alcohol abuse Paternal Grandfather   . Heart disease Paternal Grandfather        afib  . Interstitial cystitis Daughter   . Arthritis Son   . Alcohol abuse Son        in remission  . Mental illness Son        depression  . Other Son        interstitial cystitis  . Colon cancer Other        parent  . Arthritis Paternal Uncle     Social History   Socioeconomic History  . Marital status: Married    Spouse name: Not on file  . Number of children: 2  .  Years of education: Not on file  . Highest education level: Not on file  Occupational History  . Occupation: Best boy: [employer]  Tobacco Use  . Smoking status: Never Smoker  . Smokeless tobacco: Never Used  Substance and Sexual Activity  . Alcohol use: No  . Drug use: No  . Sexual activity: Yes    Comment: lives with husband and adult son, no major dietary restrictions, full diability  Other Topics Concern  . Not on file  Social History Narrative  . Not on file   Social Determinants of Health   Financial Resource Strain: Not on file  Food Insecurity: Not on file  Transportation Needs: Not on file  Physical Activity: Not on file  Stress: Not on file  Social Connections: Not on file  Intimate Partner Violence: Not on file    Outpatient Medications Prior to Visit  Medication Sig Dispense Refill  . amitriptyline (ELAVIL) 25 MG tablet Take 1 tablet (25 mg total) by mouth at bedtime.    Marland Kitchen atorvastatin (LIPITOR) 40 MG tablet Take 1 tablet (40 mg total) by mouth at bedtime. 90 tablet 1  . busPIRone (BUSPAR) 10 MG tablet Take 1 tablet (10 mg total) by mouth 2 (two) times daily. 180 tablet 1  . cyclobenzaprine (FLEXERIL) 5 MG tablet Take 5 mg by mouth 3 (three) times daily as needed for muscle spasms.    . DULoxetine (CYMBALTA) 60 MG capsule Take 2 capsules (120 mg total) by mouth daily. 180 capsule 1  . esomeprazole (NEXIUM) 40 MG capsule Take 40 mg by mouth 2 (two) times daily before a meal.     . hydrOXYzine (ATARAX/VISTARIL) 25 MG tablet Take 1 tablet (25 mg total) by mouth at bedtime as needed. 90 tablet 1  . levothyroxine (SYNTHROID) 125 MCG tablet Take 1 tablet (125 mcg total) by mouth daily before breakfast. 90 tablet 1  . losartan (COZAAR) 100 MG tablet Take 1 tablet (100 mg total) by mouth every morning. 90 tablet 1  . Magnesium 400 MG CAPS Take 400 mg by  mouth daily. Reported on 02/13/2016    . meloxicam (MOBIC) 15 MG tablet Take 1 tablet (15 mg  total) by mouth daily as needed for pain. 90 tablet 1  . Methen-Hyosc-Meth Blue-Na Phos (UROGESIC-BLUE) 81.6 MG TABS Take 1 tablet (81.6 mg total) by mouth 4 (four) times daily as needed.    . metoprolol succinate (TOPROL-XL) 100 MG 24 hr tablet Take 1 tablet (100 mg total) by mouth every morning. 90 tablet 1  . metoprolol succinate (TOPROL-XL) 50 MG 24 hr tablet Take 1 tablet (50 mg total) by mouth every morning. 90 tablet 1  . Misc Natural Products (FIBER 7 PO) Take by mouth.    . nitrofurantoin (MACRODANTIN) 50 MG capsule Take 50 mg by mouth daily.     . ondansetron (ZOFRAN) 4 MG tablet Take 4 mg by mouth every 8 (eight) hours as needed for nausea or vomiting.    . rizatriptan (MAXALT) 10 MG tablet Take 10 mg by mouth as needed for migraine. May repeat in 2 hours if needed    . topiramate (TOPAMAX) 100 MG tablet Take 100 mg by mouth daily.    Marland Kitchen amoxicillin (AMOXIL) 875 MG tablet Take 1 tablet (875 mg total) by mouth 2 (two) times daily. 14 tablet 0  . HYDROcodone-acetaminophen (NORCO) 5-325 MG tablet Take 1 tablet by mouth every 6 (six) hours as needed for moderate pain. 60 tablet 0  . topiramate (TOPAMAX) 100 MG tablet Take 1 tablet (100 mg total) by mouth 2 (two) times daily. (Patient taking differently: Take 100 mg by mouth daily.) 180 tablet 1  . HYDROcodone-homatropine (HYCODAN) 5-1.5 MG/5ML syrup Take 5 mLs by mouth 2 (two) times daily as needed for cough. 100 mL 0   No facility-administered medications prior to visit.    Allergies  Allergen Reactions  . Ciprofloxacin Rash and Shortness Of Breath  . Sulfa Antibiotics Hives  . Sulfasalazine Hives  . Cortisone Other (See Comments)    Red flush and anxiety  . Tramadol Other (See Comments)    headache  . Zonisamide Other (See Comments)    unk  . Erythromycin Rash  . Erythromycin Base Rash  . Levofloxacin Nausea Only  . Lisinopril     Other reaction(s): Cough (ALLERGY/intolerance)  . Prednisone Rash and Anxiety    Review of  Systems  Constitutional: Positive for malaise/fatigue. Negative for fever.  HENT: Negative for congestion.   Eyes: Negative for blurred vision.  Respiratory: Negative for shortness of breath.   Cardiovascular: Negative for chest pain, palpitations and leg swelling.  Gastrointestinal: Negative for abdominal pain, blood in stool and nausea.  Genitourinary: Negative for dysuria and frequency.  Musculoskeletal: Positive for back pain, joint pain and myalgias. Negative for falls.  Skin: Negative for rash.  Neurological: Negative for dizziness, loss of consciousness and headaches.  Endo/Heme/Allergies: Negative for environmental allergies.  Psychiatric/Behavioral: Positive for depression. The patient is nervous/anxious.        Objective:    Physical Exam Vitals and nursing note reviewed.  Constitutional:      General: She is not in acute distress.    Appearance: She is well-developed.  HENT:     Head: Normocephalic and atraumatic.     Nose: Nose normal.  Eyes:     General:        Right eye: No discharge.        Left eye: No discharge.  Cardiovascular:     Rate and Rhythm: Normal rate and regular rhythm.  Heart sounds: No murmur heard.   Pulmonary:     Effort: Pulmonary effort is normal.     Breath sounds: Normal breath sounds.  Abdominal:     General: Bowel sounds are normal.     Palpations: Abdomen is soft.     Tenderness: There is no abdominal tenderness.  Musculoskeletal:     Cervical back: Normal range of motion and neck supple.  Skin:    General: Skin is warm and dry.  Neurological:     Mental Status: She is alert and oriented to person, place, and time.     BP 128/72 (BP Location: Left Arm, Patient Position: Sitting, Cuff Size: Normal)   Pulse 85   Resp 17   Ht 5\' 7"  (1.702 m)   Wt 261 lb (118.4 kg)   SpO2 95%   BMI 40.88 kg/m  Wt Readings from Last 3 Encounters:  12/14/20 261 lb (118.4 kg)  09/05/20 250 lb (113.4 kg)  07/27/20 251 lb 3.2 oz (113.9 kg)     Diabetic Foot Exam - Simple   No data filed    Lab Results  Component Value Date   WBC 6.9 12/14/2020   HGB 14.3 12/14/2020   HCT 42.3 12/14/2020   PLT 185.0 12/14/2020   GLUCOSE 93 12/14/2020   CHOL 182 12/14/2020   TRIG 132.0 12/14/2020   HDL 72.30 12/14/2020   LDLDIRECT 103.0 06/03/2017   LDLCALC 84 12/14/2020   ALT 14 12/14/2020   AST 14 12/14/2020   NA 138 12/14/2020   K 4.6 12/14/2020   CL 103 12/14/2020   CREATININE 1.10 12/14/2020   BUN 22 12/14/2020   CO2 25 12/14/2020   TSH 0.23 (L) 12/14/2020   HGBA1C 5.2 07/27/2020    Lab Results  Component Value Date   TSH 0.23 (L) 12/14/2020   Lab Results  Component Value Date   WBC 6.9 12/14/2020   HGB 14.3 12/14/2020   HCT 42.3 12/14/2020   MCV 90.9 12/14/2020   PLT 185.0 12/14/2020   Lab Results  Component Value Date   NA 138 12/14/2020   K 4.6 12/14/2020   CO2 25 12/14/2020   GLUCOSE 93 12/14/2020   BUN 22 12/14/2020   CREATININE 1.10 12/14/2020   BILITOT 0.6 12/14/2020   ALKPHOS 71 12/14/2020   AST 14 12/14/2020   ALT 14 12/14/2020   PROT 6.1 12/14/2020   ALBUMIN 4.0 12/14/2020   CALCIUM 9.4 12/14/2020   GFR 52.68 (L) 12/14/2020   Lab Results  Component Value Date   CHOL 182 12/14/2020   Lab Results  Component Value Date   HDL 72.30 12/14/2020   Lab Results  Component Value Date   LDLCALC 84 12/14/2020   Lab Results  Component Value Date   TRIG 132.0 12/14/2020   Lab Results  Component Value Date   CHOLHDL 3 12/14/2020   Lab Results  Component Value Date   HGBA1C 5.2 07/27/2020       Assessment & Plan:   Problem List Items Addressed This Visit    GERD (gastroesophageal reflux disease)    Despite Nexium twice a day she is still having daily symptoms. Add Famotidine 40 mg po qhs. Avoid offending foods, start probiotics. Do not eat large meals in late evening and consider raising head of bed. Attempt modest weight loss. Referred to gastroenterology      Relevant  Medications   famotidine (PEPCID) 40 MG tablet   Hyperlipidemia    Encouraged heart healthy diet, increase exercise, avoid  trans fats, consider a krill oil cap daily      Relevant Orders   Lipid panel (Completed)   Hypertension    Well controlled, no changes to meds. Encouraged heart healthy diet such as the DASH diet and exercise as tolerated.       Relevant Orders   CBC (Completed)   Comprehensive metabolic panel (Completed)   TSH (Completed)   Atrial fibrillation (HCC)    Has had some episodes but is doing well today.       Class 2 severe obesity with serious comorbidity and body mass index (BMI) of 36.0 to 36.9 in adult, unspecified obesity type ([place])    She is frustrated with  Her weight gain. Encouraged MIND diet, decrease po intake and increase exercise as tolerated. Needs 7-8 hours of sleep nightly. Avoid trans fats, eat small, frequent meals every 4-5 hours with lean proteins, complex carbs and healthy fats. Minimize simple carbs, processed foods      Depression with anxiety    She acknowledges significant stress but she does not want to alter meds at this time      Chronic low back pain with left-sided sciatica    Encouraged moist heat and gentle stretching as tolerated. May try NSAIDs and prescription meds as directed and report if symptoms worsen or seek immediate care. Given refill on her hydrocodone to use prn.      Relevant Medications   topiramate (TOPAMAX) 100 MG tablet   HYDROcodone-acetaminophen (NORCO) 7.5-325 MG tablet   Vitamin D deficiency    Supplement and monitor      Relevant Orders   VITAMIN D 25 Hydroxy (Vit-D Deficiency, Fractures) (Completed)   Left hip pain    Encouraged moist heat and gentle stretching as tolerated. May try NSAIDs and prescription meds as directed and report if symptoms worsen or seek immediate care. Is following with ortho and had some PT but she feels it only helped al ittle, is not hurting now as much as it was. Encouraged  to return to discuss options. Consider topical treatments such aspercreme or lidocaine topically       Other Visit Diagnoses    Dysuria    -  Primary   Relevant Orders   Urinalysis (Completed)   Urine Culture   Reflux gastritis       Relevant Orders   Ambulatory referral to Gastroenterology      I have discontinued Burnett Corrente. Januszewski's amoxicillin, HYDROcodone-homatropine, and HYDROcodone-acetaminophen. I am also having her start on HYDROcodone-acetaminophen and famotidine. Additionally, I am having her maintain her rizatriptan, Magnesium, esomeprazole, Misc Natural Products (FIBER 7 PO), Urogesic-Blue, amitriptyline, hydrOXYzine, cyclobenzaprine, ondansetron, nitrofurantoin, meloxicam, DULoxetine, losartan, atorvastatin, metoprolol succinate, metoprolol succinate, levothyroxine, busPIRone, and topiramate.  Meds ordered this encounter  Medications  . HYDROcodone-acetaminophen (NORCO) 7.5-325 MG tablet    Sig: Take 1 tablet by mouth every 6 (six) hours as needed for moderate pain.    Dispense:  60 tablet    Refill:  0  . famotidine (PEPCID) 40 MG tablet    Sig: Take 1 tablet (40 mg total) by mouth at bedtime as needed for heartburn or indigestion.    Dispense:  30 tablet    Refill:  3     Penni Homans, MD

## 2020-12-15 NOTE — Assessment & Plan Note (Signed)
She acknowledges significant stress but she does not want to alter meds at this time

## 2020-12-15 NOTE — Assessment & Plan Note (Addendum)
Despite Nexium twice a day she is still having daily symptoms. Add Famotidine 40 mg po qhs. Avoid offending foods, start probiotics. Do not eat large meals in late evening and consider raising head of bed. Attempt modest weight loss. Referred to gastroenterology

## 2020-12-15 NOTE — Assessment & Plan Note (Signed)
>>  ASSESSMENT AND PLAN FOR CLASS 2 SEVERE OBESITY WITH SERIOUS COMORBIDITY AND BODY MASS INDEX (BMI) OF 36.0 TO 36.9 IN ADULT, UNSPECIFIED OBESITY TYPE (HCC) WRITTEN ON 12/15/2020  5:26 PM BY BLYTH, STACEY A, MD  She is frustrated with  Her weight gain. Encouraged MIND diet, decrease po intake and increase exercise as tolerated. Needs 7-8 hours of sleep nightly. Avoid trans fats, eat small, frequent meals every 4-5 hours with lean proteins, complex carbs and healthy fats. Minimize simple carbs, processed foods

## 2020-12-15 NOTE — Assessment & Plan Note (Signed)
Encouraged moist heat and gentle stretching as tolerated. May try NSAIDs and prescription meds as directed and report if symptoms worsen or seek immediate care. Given refill on her hydrocodone to use prn.

## 2020-12-15 NOTE — Assessment & Plan Note (Signed)
Has had some episodes but is doing well today.

## 2020-12-15 NOTE — Assessment & Plan Note (Signed)
She is frustrated with  Her weight gain. Encouraged MIND diet, decrease po intake and increase exercise as tolerated. Needs 7-8 hours of sleep nightly. Avoid trans fats, eat small, frequent meals every 4-5 hours with lean proteins, complex carbs and healthy fats. Minimize simple carbs, processed foods

## 2020-12-18 ENCOUNTER — Encounter: Payer: Self-pay | Admitting: *Deleted

## 2020-12-19 ENCOUNTER — Other Ambulatory Visit: Payer: Self-pay

## 2020-12-19 ENCOUNTER — Ambulatory Visit: Payer: PPO | Admitting: Gastroenterology

## 2020-12-19 ENCOUNTER — Encounter: Payer: Self-pay | Admitting: Gastroenterology

## 2020-12-19 VITALS — BP 161/100 | HR 87 | Temp 98.7°F | Ht 67.0 in | Wt 263.0 lb

## 2020-12-19 DIAGNOSIS — K219 Gastro-esophageal reflux disease without esophagitis: Secondary | ICD-10-CM | POA: Diagnosis not present

## 2020-12-19 DIAGNOSIS — K317 Polyp of stomach and duodenum: Secondary | ICD-10-CM

## 2020-12-19 DIAGNOSIS — Z800 Family history of malignant neoplasm of digestive organs: Secondary | ICD-10-CM

## 2020-12-19 MED ORDER — NA SULFATE-K SULFATE-MG SULF 17.5-3.13-1.6 GM/177ML PO SOLN: ORAL | 0 refills | Status: DC

## 2020-12-19 NOTE — Progress Notes (Signed)
Alexandra Patterson [address]  [facility]  [place], [place] [address]  Main: [phone]  Fax: [phone]   Gastroenterology Consultation  Referring Provider:     [name], MD Primary Care Physician:  [name], MD Reason for Consultation:     Reflux        HPI:    Chief Complaint  Patient presents with  . Gastritis    Alexandra Patterson is a 65 y.o. y/o female referred for consultation & management  by Dr. [name], Bonnita Levan, MD.  Patient reports chronic history of reflux and has been on PPI.  However, has noted uncontrolled, breakthrough symptoms daily despite being on Nexium.  Sleeps on a reclining bed and tries to avoid exacerbating foods.  Tries to eat 3 hours before bedtime.  No nausea or vomiting.  No dysphagia.  Previous records personally reviewed.  Patient had EGD and colonoscopy and push enteroscopy in May 2015 for anemia.  This showed internal hemorrhoids on the colonoscopy.  Upper endoscopy showed gastric and small bowel polyps.  Small bowel polyp biopsies did not show any significant histopathology change.  Gastric biopsies showed fundic gland polyps.  Patient states she was told to have a repeat in 5 years due to family history of colon cancer.   Past Medical History:  Diagnosis Date  . Allergy   . Anxiety   . Atrial fibrillation ([facility])   . Back pain   . Bilateral swelling of feet   . Bursitis   . Cervical cancer screening 09/07/2015   Menarche at 11 Irregular and heavy and painful flow secondary to fibroids No history of abnormal pap in past, last pap roughly 2003 G2P2, s/p 2 SVD history of abnormal MGM, 1 abnl bx right breast benign, normal otherwise Noconcerns today TAH b/l SPO for fibroids and migrainesand breast bx on right gyn surgeries  . Constipation   . Cough 08/13/2015  . Depression   . Depression with anxiety 06/29/2016  . Fainting    once to due heart out of rythm  . Fibromyalgia   . Frequent headaches   . GERD (gastroesophageal  reflux disease)   . 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 Heartburn   . History of blood clots   . History of chicken pox   . Hx of blood clots    dvt  . Hyperlipidemia   . Hyperplastic colon polyp   . Hypertension   . Hypothyroid   . Insomnia 08/13/2015  . Internal hemorrhoids   . Interstitial cystitis   . Joint pain   . Lesion of lung    xray in 2014 thought to be benign seen at [facility] pulmonology  . Measles    h/o  . Migraine   . Obesity   . Osteoarthritis   . Preventative health care 02/03/2017  . Raynaud's disease   . S/P AVR (aortic valve replacement) 03/19/2015  . Shoulder pain 06/03/2017  . UTI (lower urinary tract infection)   . UTI (urinary tract infection) 02/11/2016    Past Surgical History:  Procedure Laterality Date  . ABDOMINAL HYSTERECTOMY     TAH SPO  . AORTIC VALVE REPLACEMENT  2014   with [facility]  . ATRIAL ABLATION SURGERY    . BLADDER SUSPENSION    . BREAST BIOPSY Right    Needle Biopsy  . DILATION AND CURETTAGE OF UTERUS     x 3  . loop heart    . SHOULDER SURGERY Left  arthroscopy for spurs  . TONSILLECTOMY    . TUBAL LIGATION      Prior to Admission medications   Medication Sig Start Date End Date Taking? Authorizing Provider  amitriptyline (ELAVIL) 25 MG tablet Take 1 tablet (25 mg total) by mouth at bedtime. 11/17/18  Yes [name], MD  atorvastatin (LIPITOR) 40 MG tablet Take 1 tablet (40 mg total) by mouth at bedtime. 09/21/20  Yes [name], MD  busPIRone (BUSPAR) 10 MG tablet Take 1 tablet (10 mg total) by mouth 2 (two) times daily. 11/01/20  Yes [name], MD  cyclobenzaprine (FLEXERIL) 5 MG tablet Take 5 mg by mouth 3 (three) times daily as needed for muscle spasms.   Yes [provider]  DULoxetine (CYMBALTA) 60 MG capsule Take 2 capsules (120 mg total) by mouth daily. 09/21/20  Yes [name], MD  esomeprazole (NEXIUM) 40 MG capsule Take 40 mg by mouth 2 (two) times daily before a meal.    Yes [provider]  famotidine (PEPCID) 40 MG tablet Take 1 tablet (40 mg total) by mouth at bedtime as needed for heartburn or indigestion. 12/14/20  Yes [name], MD  HYDROcodone-acetaminophen (NORCO) 7.5-325 MG tablet Take 1 tablet by mouth every 6 (six) hours as needed for moderate pain. 12/14/20  Yes [name], MD  hydrOXYzine (ATARAX/VISTARIL) 25 MG tablet Take 1 tablet (25 mg total) by mouth at bedtime as needed. 11/18/19  Yes [name], MD  levothyroxine (SYNTHROID) 125 MCG tablet Take 125 mcg by mouth. Daily 6 times a week.   Yes [provider]  losartan (COZAAR) 100 MG tablet Take 1 tablet (100 mg total) by mouth every morning. 09/21/20  Yes [name], MD  Magnesium 400 MG CAPS Take 400 mg by mouth daily. Reported on 02/13/2016   Yes [provider]  meloxicam (MOBIC) 15 MG tablet Take 1 tablet (15 mg total) by mouth daily as needed for pain. 09/21/20  Yes [name], MD  Methen-Hyosc-Meth Blue-Na Phos (UROGESIC-BLUE) 81.6 MG TABS Take 1 tablet (81.6 mg total) by mouth 4 (four) times daily as needed. 11/17/18  Yes [name], MD  metoprolol succinate (TOPROL-XL) 100 MG 24 hr tablet Take 1 tablet (100 mg total) by mouth every morning. 10/20/20  Yes [name], MD  metoprolol succinate (TOPROL-XL) 50 MG 24 hr tablet Take 1 tablet (50 mg total) by mouth every morning. 10/20/20  Yes [name], MD  Misc Natural Products (FIBER 7 PO) Take by mouth.   Yes [provider]  ondansetron (ZOFRAN) 4 MG tablet Take 4 mg by mouth every 8 (eight) hours as needed for nausea or vomiting.   Yes [provider]  rizatriptan (MAXALT) 10 MG tablet Take 10 mg by mouth as needed for migraine. May repeat in 2 hours if needed   Yes [provider]  topiramate (TOPAMAX) 100 MG tablet Take 100 mg by mouth daily.   Yes [provider]  Na Sulfate-K Sulfate-Mg Sulf 17.5-3.13-1.6 GM/177ML SOLN At 5 PM the day before procedure take 1  bottle and 5 hours before procedure take 1 bottle. 12/19/20   Virgel Manifold, MD    Family History  Problem Relation Age of Onset  . Colon polyps Father   . Alzheimer's disease Father   . Alcohol abuse Father   . Cancer Father        colon cancer  . Arthritis Mother  s/p TKR  . Neuropathy Mother   . Hyperlipidemia Mother   . Other Mother        familial mediterranean fever  . Thyroid disease Mother   . Hypertension Mother   . Obesity Mother   . Heart disease Mother   . Cancer Brother        prostate cancer  . Heart disease Brother        cardiomegaly  . Other Brother        familial mediterranean fever  . Asthma Maternal Grandmother   . Congestive Heart Failure Maternal Grandmother   . Heart disease Maternal Grandmother        chf  . Stroke Maternal Grandfather   . Diabetes Maternal Grandfather   . Heart disease Maternal Grandfather        hardening of the arteries  . Stroke Paternal Grandfather   . Atrial fibrillation Paternal Grandfather   . Alcohol abuse Paternal Grandfather   . Heart disease Paternal Grandfather        afib  . Interstitial cystitis Daughter   . Arthritis Son   . Alcohol abuse Son        in remission  . Mental illness Son        depression  . Other Son        interstitial cystitis  . Colon cancer Other        parent  . Arthritis Paternal Uncle      Social History   Tobacco Use  . Smoking status: Never Smoker  . Smokeless tobacco: Never Used  Substance Use Topics  . Alcohol use: No  . Drug use: No    Allergies as of 12/19/2020 - Review Complete 12/19/2020  Allergen Reaction Noted  . Ciprofloxacin Rash and Shortness Of Breath 01/04/2016  . Sulfa antibiotics Hives 03/13/2015  . Sulfasalazine Hives 03/13/2015  . Cortisone Other (See Comments) 08/16/2015  . Tramadol Other (See Comments) 01/04/2016  . Zonisamide Other (See Comments) 01/04/2016  . Erythromycin Rash 03/10/2012  . Erythromycin base Rash 07/01/2011  .  Levofloxacin Nausea Only 01/04/2016  . Lisinopril  01/10/2016  . Prednisone Rash and Anxiety 07/01/2011    Review of Systems:    All systems reviewed and negative except where noted in HPI.   Physical Exam:  BP (!) 161/100   Pulse 87   Temp 98.7 F (37.1 C) (Oral)   Ht 5\' 7"  (1.702 m)   Wt 263 lb (119.3 kg)   BMI 41.19 kg/m  No LMP recorded. Patient has had a hysterectomy. Psych:  Alert and cooperative. Normal mood and affect. General:   Alert,  Well-developed, well-nourished, pleasant and cooperative in NAD Head:  Normocephalic and atraumatic. Eyes:  Sclera clear, no icterus.   Conjunctiva pink. Ears:  Normal auditory acuity. Nose:  No deformity, discharge, or lesions. Mouth:  No deformity or lesions,oropharynx pink & moist. Neck:  Supple; no masses or thyromegaly. Abdomen:  Normal bowel sounds.  No bruits.  Soft, non-tender and non-distended without masses, hepatosplenomegaly or hernias noted.  No guarding or rebound tenderness.    Msk:  Symmetrical without gross deformities. Good, equal movement & strength bilaterally. Pulses:  Normal pulses noted. Extremities:  No clubbing or edema.  No cyanosis. Neurologic:  Alert and oriented x3;  grossly normal neurologically. Skin:  Intact without significant lesions or rashes. No jaundice. Lymph Nodes:  No significant cervical adenopathy. Psych:  Alert and cooperative. Normal mood and affect.   Labs: CBC    Component  Value Date/Time   WBC 6.9 12/14/2020 1409   RBC 4.65 12/14/2020 1409   HGB 14.3 12/14/2020 1409   HGB 10.7 (L) 01/24/2020 1255   HCT 42.3 12/14/2020 1409   HCT 34.1 01/24/2020 1255   PLT 185.0 12/14/2020 1409   PLT 274 01/24/2020 1255   MCV 90.9 12/14/2020 1409   MCV 79 01/24/2020 1255   MCH 30.8 07/27/2020 1146   MCHC 33.8 12/14/2020 1409   RDW 14.2 12/14/2020 1409   RDW 15.3 01/24/2020 1255   LYMPHSABS 1.6 01/24/2020 1255   MONOABS 0.3 05/14/2018 1159   EOSABS 0.3 01/24/2020 1255   BASOSABS 0.1  01/24/2020 1255   CMP     Component Value Date/Time   NA 138 12/14/2020 1409   NA 127 (L) 01/24/2020 1255   K 4.6 12/14/2020 1409   CL 103 12/14/2020 1409   CO2 25 12/14/2020 1409   GLUCOSE 93 12/14/2020 1409   BUN 22 12/14/2020 1409   BUN 13 01/24/2020 1255   CREATININE 1.10 12/14/2020 1409   CREATININE 0.99 07/27/2020 1146   CALCIUM 9.4 12/14/2020 1409   PROT 6.1 12/14/2020 1409   PROT 6.3 01/24/2020 1255   ALBUMIN 4.0 12/14/2020 1409   ALBUMIN 4.4 01/24/2020 1255   AST 14 12/14/2020 1409   ALT 14 12/14/2020 1409   ALKPHOS 71 12/14/2020 1409   BILITOT 0.6 12/14/2020 1409   BILITOT 0.4 01/24/2020 1255   GFRNONAA 77 01/24/2020 1255   GFRAA 89 01/24/2020 1255    Imaging Studies: No results found.  Assessment and Plan:   Alexandra Patterson is a 65 y.o. y/o female has been referred for GERD  Patient reports being on PPI chronically and has had breakthrough symptoms daily despite daily PPI use and lifestyle measures to control her symptoms  Therefore EGD indicated for further evaluation  Patient is also due for colonoscopy at this time  I have discussed alternative options, risks & benefits,  which include, but are not limited to, bleeding, infection, perforation,respiratory complication & drug reaction.  The patient agrees with this plan & written consent will be obtained.    Patient educated extensively on acid reflux lifestyle modification, including buying a bed wedge, not eating 3 hrs before bedtime, diet modifications, and handout given for the same.   Follow-up in clinic post procedure to discuss findings and change in medication therapy for breakthrough symptoms as necessary    Dr Alexandra Patterson  Speech recognition software was used to dictate the above note.

## 2020-12-22 ENCOUNTER — Telehealth: Payer: Self-pay | Admitting: Pharmacist

## 2020-12-22 NOTE — Progress Notes (Addendum)
Chronic Care Management Pharmacy Assistant   Name: KYNSLEE BAHAM  MRN: [id number] DOB: 1956/07/25   Reason for Encounter: Medication Review-Medication Coordination Call  Medications: Outpatient Encounter Medications as of 12/22/2020  Medication Sig Note   amitriptyline (ELAVIL) 25 MG tablet Take 1 tablet (25 mg total) by mouth at bedtime.    atorvastatin (LIPITOR) 40 MG tablet Take 1 tablet (40 mg total) by mouth at bedtime.    busPIRone (BUSPAR) 10 MG tablet Take 1 tablet (10 mg total) by mouth 2 (two) times daily.    cyclobenzaprine (FLEXERIL) 5 MG tablet Take 5 mg by mouth 3 (three) times daily as needed for muscle spasms.    DULoxetine (CYMBALTA) 60 MG capsule Take 2 capsules (120 mg total) by mouth daily.    esomeprazole (NEXIUM) 40 MG capsule Take 40 mg by mouth 2 (two) times daily before a meal.  07/31/2020: Gets from [place]   famotidine (PEPCID) 40 MG tablet Take 1 tablet (40 mg total) by mouth at bedtime as needed for heartburn or indigestion.    HYDROcodone-acetaminophen (NORCO) 7.5-325 MG tablet Take 1 tablet by mouth every 6 (six) hours as needed for moderate pain.    hydrOXYzine (ATARAX/VISTARIL) 25 MG tablet Take 1 tablet (25 mg total) by mouth at bedtime as needed.    levothyroxine (SYNTHROID) 125 MCG tablet Take 125 mcg by mouth. Daily 6 times a week.    losartan (COZAAR) 100 MG tablet Take 1 tablet (100 mg total) by mouth every morning.    Magnesium 400 MG CAPS Take 400 mg by mouth daily. Reported on 02/13/2016    meloxicam (MOBIC) 15 MG tablet Take 1 tablet (15 mg total) by mouth daily as needed for pain.    Methen-Hyosc-Meth Blue-Na Phos (UROGESIC-BLUE) 81.6 MG TABS Take 1 tablet (81.6 mg total) by mouth 4 (four) times daily as needed.    metoprolol succinate (TOPROL-XL) 100 MG 24 hr tablet Take 1 tablet (100 mg total) by mouth every morning.    metoprolol succinate (TOPROL-XL) 50 MG 24 hr tablet Take 1 tablet (50 mg total) by mouth every morning.    Misc Natural Products  (FIBER 7 PO) Take by mouth.    Na Sulfate-K Sulfate-Mg Sulf 17.5-3.13-1.6 GM/177ML SOLN At 5 PM the day before procedure take 1 bottle and 5 hours before procedure take 1 bottle.    ondansetron (ZOFRAN) 4 MG tablet Take 4 mg by mouth every 8 (eight) hours as needed for nausea or vomiting.    rizatriptan (MAXALT) 10 MG tablet Take 10 mg by mouth as needed for migraine. May repeat in 2 hours if needed    topiramate (TOPAMAX) 100 MG tablet Take 100 mg by mouth daily.    No facility-administered encounter medications on file as of 12/22/2020.    Reviewed chart for medication changes ahead of medication coordination call.  No hospital visits since last adherence review.  Office visits: 12/14/20 [name], MD. For General Follow-Up. STARTED Famotidine 40 mg at bedtime PRN. INCREASED Hydrocodone-Acetaminophen to 7.5-325 mg 1 tablet every 6 hours PRN. DECREASED Topiramate to 100 mg daily. STOPPED Amoxicillin and Hydrocodone-Homatropine.   Consults Visits: 12/19/20 Odessa Fleming, MD. For Acid Reflux.STOPPED Nitrofurantoin Microcrystal.  12/07/20 Neurology Feraru, Haze Rushing, MD. For Migraines snd Low back pain.MRI taken.  DECREASED Topamax to 100 mg HS. STARTED Levothyroxine 125 mg. STOPPED Levothyroxine 137 mg.  BP Readings from Last 3 Encounters:  12/19/20 (!) 161/100  12/14/20 128/72  07/27/20 124/74  Lab Results  Component Value Date   HGBA1C 5.2 07/27/2020     Patient obtains medications through Adherence Packaging  90 Days   Last adherence delivery included: 90 DS 10/30/20 Buspirone 10 mg; one tab at [facility] at Bedtime Amitriptyline 25 mg; one tab at Bedtime Hydroxyzine Hcl 25 mg; two tabs at Bedtime Losartan 100 mg; one tab at Breakfast Levothyroxine 125 mcg; one tab before Breakfast  Duloxetine 60 mg; two tabs at Breakfast Meloxicam 15 mg; one tab at Breakfast Atorvastatin 40 mg; one tab at Bedtime Metoprolol Succinate 50 mg; one tab with  Breakfast Metoprolol Succinate 100 mg; one tab at Breakfast Topiramate 100 mg; one tab at Breakfast, one at Bedtime   Patient declined meds last month:(Enough on hand) Cyclobenzaprine 5 mg tab twice a day as needed for muscle spasms. Buspirone 10 mg; one tab at [facility] at Bedtime Amitriptyline 25 mg; one tab at Bedtime Hydroxyzine Hcl 25 mg; two tabs at Bedtime Losartan 100 mg; one tab at Breakfast Levothyroxine 125 mcg; one tab before Breakfast  Duloxetine 60 mg; two tabs at Breakfast Meloxicam 15 mg; one tab at Breakfast Atorvastatin 40 mg; one tab at Bedtime Metoprolol Succinate 50 mg; one tab with Breakfast Metoprolol Succinate 100 mg; one tab at Breakfast Topiramate 100 mg; one tab at Breakfast, one at Bedtime  Patient is not due for an adherence delivery at this time.  Called patient and reviewed medications and coordinated delivery.  This delivery to include: None  Patient declined the following medications:  Cyclobenzaprine 5 mg tab twice a day as needed for muscle spasms. Buspirone 10 mg; one tab at [facility] at Bedtime Amitriptyline 25 mg; one tab at Bedtime Hydroxyzine Hcl 25 mg; two tabs at Bedtime Losartan 100 mg; one tab at Breakfast Levothyroxine 125 mcg; one tab before Breakfast  Duloxetine 60 mg; two tabs at Breakfast Meloxicam 15 mg; one tab at Breakfast Atorvastatin 40 mg; one tab at Bedtime Metoprolol Succinate 50 mg; one tab with Breakfast Metoprolol Succinate 100 mg; one tab at Breakfast Topiramate 100 mg; one tab at breakfast  Patient does not need refills at this time.    Follow-Up:Pharmacist Review  Charlann Lange, RMA Clinical Pharmacist Assistant [phone]  10 minutes spent in review, coordination, and documentation.  Reviewed by: Beverly Milch, PharmD Clinical Pharmacist [facility] [phone]

## 2020-12-26 ENCOUNTER — Other Ambulatory Visit: Payer: Self-pay

## 2020-12-26 ENCOUNTER — Other Ambulatory Visit
Admission: RE | Admit: 2020-12-26 | Discharge: 2020-12-26 | Disposition: A | Discharge status: Home / Self Care | Payer: PPO | Source: Ambulatory Visit | Attending: Gastroenterology | Admitting: Gastroenterology

## 2020-12-26 DIAGNOSIS — Z20822 Contact with and (suspected) exposure to covid-19: Secondary | ICD-10-CM | POA: Diagnosis not present

## 2020-12-26 DIAGNOSIS — Z01812 Encounter for preprocedural laboratory examination: Secondary | ICD-10-CM | POA: Insufficient documentation

## 2020-12-26 LAB — SARS CORONAVIRUS 2 (TAT 6-24 HRS): SARS Coronavirus 2: NEGATIVE

## 2020-12-28 ENCOUNTER — Ambulatory Visit: Payer: PPO | Admitting: Anesthesiology

## 2020-12-28 ENCOUNTER — Ambulatory Visit
Admission: RE | Admit: 2020-12-28 | Discharge: 2020-12-28 | Disposition: A | Discharge status: Home / Self Care | Payer: PPO | Attending: Gastroenterology | Admitting: Gastroenterology

## 2020-12-28 ENCOUNTER — Other Ambulatory Visit: Payer: Self-pay

## 2020-12-28 ENCOUNTER — Encounter: Payer: Self-pay | Admitting: Gastroenterology

## 2020-12-28 ENCOUNTER — Encounter
Admission: RE | Disposition: A | Discharge status: Home / Self Care | Payer: Self-pay | Source: Home / Self Care | Attending: Gastroenterology

## 2020-12-28 DIAGNOSIS — Z881 Allergy status to other antibiotic agents status: Secondary | ICD-10-CM | POA: Diagnosis not present

## 2020-12-28 DIAGNOSIS — K2289 Other specified disease of esophagus: Secondary | ICD-10-CM | POA: Diagnosis not present

## 2020-12-28 DIAGNOSIS — Z1211 Encounter for screening for malignant neoplasm of colon: Secondary | ICD-10-CM | POA: Diagnosis not present

## 2020-12-28 DIAGNOSIS — K317 Polyp of stomach and duodenum: Secondary | ICD-10-CM | POA: Diagnosis not present

## 2020-12-28 DIAGNOSIS — Z800 Family history of malignant neoplasm of digestive organs: Secondary | ICD-10-CM

## 2020-12-28 DIAGNOSIS — Z13810 Encounter for screening for upper gastrointestinal disorder: Secondary | ICD-10-CM | POA: Diagnosis not present

## 2020-12-28 DIAGNOSIS — K219 Gastro-esophageal reflux disease without esophagitis: Secondary | ICD-10-CM | POA: Diagnosis not present

## 2020-12-28 DIAGNOSIS — K449 Diaphragmatic hernia without obstruction or gangrene: Secondary | ICD-10-CM | POA: Insufficient documentation

## 2020-12-28 DIAGNOSIS — Z882 Allergy status to sulfonamides status: Secondary | ICD-10-CM | POA: Diagnosis not present

## 2020-12-28 DIAGNOSIS — Z79899 Other long term (current) drug therapy: Secondary | ICD-10-CM | POA: Diagnosis not present

## 2020-12-28 DIAGNOSIS — Z7952 Long term (current) use of systemic steroids: Secondary | ICD-10-CM | POA: Insufficient documentation

## 2020-12-28 DIAGNOSIS — Z79890 Hormone replacement therapy (postmenopausal): Secondary | ICD-10-CM | POA: Insufficient documentation

## 2020-12-28 DIAGNOSIS — D120 Benign neoplasm of cecum: Secondary | ICD-10-CM | POA: Insufficient documentation

## 2020-12-28 DIAGNOSIS — K3189 Other diseases of stomach and duodenum: Secondary | ICD-10-CM | POA: Diagnosis not present

## 2020-12-28 DIAGNOSIS — K635 Polyp of colon: Secondary | ICD-10-CM | POA: Diagnosis not present

## 2020-12-28 DIAGNOSIS — Z888 Allergy status to other drugs, medicaments and biological substances status: Secondary | ICD-10-CM | POA: Insufficient documentation

## 2020-12-28 DIAGNOSIS — L538 Other specified erythematous conditions: Secondary | ICD-10-CM | POA: Diagnosis not present

## 2020-12-28 HISTORY — PX: COLONOSCOPY WITH PROPOFOL: SHX5780

## 2020-12-28 HISTORY — DX: Nonrheumatic aortic (valve) stenosis: I35.0

## 2020-12-28 HISTORY — PX: ESOPHAGOGASTRODUODENOSCOPY (EGD) WITH PROPOFOL: SHX5813

## 2020-12-28 SURGERY — ESOPHAGOGASTRODUODENOSCOPY (EGD) WITH PROPOFOL
Anesthesia: General

## 2020-12-28 MED ORDER — PROPOFOL 500 MG/50ML IV EMUL
INTRAVENOUS | Status: DC | PRN
  Administered 2020-12-28: 145 ug/kg/min via INTRAVENOUS

## 2020-12-28 MED ORDER — LIDOCAINE HCL (PF) 2 % IJ SOLN
INTRAMUSCULAR | Status: AC
  Filled 2020-12-28: qty 35

## 2020-12-28 MED ORDER — LIDOCAINE HCL (CARDIAC) PF 100 MG/5ML IV SOSY
PREFILLED_SYRINGE | INTRAVENOUS | Status: DC | PRN
  Administered 2020-12-28: 100 mg via INTRAVENOUS

## 2020-12-28 MED ORDER — MIDAZOLAM HCL 2 MG/2ML IJ SOLN
INTRAMUSCULAR | Status: AC
  Filled 2020-12-28: qty 2

## 2020-12-28 MED ORDER — SODIUM CHLORIDE 0.9 % IV SOLN: INTRAVENOUS | Status: DC

## 2020-12-28 MED ORDER — GLYCOPYRROLATE 0.2 MG/ML IJ SOLN
INTRAMUSCULAR | Status: DC | PRN
  Administered 2020-12-28: .2 mg via INTRAVENOUS

## 2020-12-28 MED ORDER — MIDAZOLAM HCL 2 MG/2ML IJ SOLN
INTRAMUSCULAR | Status: DC | PRN
  Administered 2020-12-28: 2 mg via INTRAVENOUS

## 2020-12-28 MED ORDER — GLYCOPYRROLATE 0.2 MG/ML IJ SOLN
INTRAMUSCULAR | Status: AC
  Filled 2020-12-28: qty 6

## 2020-12-28 MED ORDER — PROPOFOL 10 MG/ML IV BOLUS
INTRAVENOUS | Status: DC | PRN
  Administered 2020-12-28: 50 mg via INTRAVENOUS
  Administered 2020-12-28: 20 mg via INTRAVENOUS
  Administered 2020-12-28: 30 mg via INTRAVENOUS

## 2020-12-28 MED ORDER — PROPOFOL 10 MG/ML IV BOLUS
INTRAVENOUS | Status: AC
  Filled 2020-12-28: qty 20

## 2020-12-28 NOTE — Anesthesia Preprocedure Evaluation (Addendum)
Anesthesia Evaluation  Patient identified by MRN, date of birth, ID band Patient awake    Reviewed: Allergy & Precautions, H&P , NPO status , Patient's Chart, lab work & pertinent test results  History of Anesthesia Complications Negative for: history of anesthetic complications  Airway Mallampati: II  TM Distance: >3 FB     Dental   Pulmonary neg pulmonary ROS, neg sleep apnea, neg COPD,    breath sounds clear to auscultation       Cardiovascular hypertension, (-) angina(-) Past MI and (-) Cardiac Stents + dysrhythmias Atrial Fibrillation + Valvular Problems/Murmurs (s/p AVR)  Rhythm:regular Rate:Normal     Neuro/Psych  Headaches, PSYCHIATRIC DISORDERS Anxiety Depression    GI/Hepatic Neg liver ROS, GERD  ,  Endo/Other  Hypothyroidism   Renal/GU negative Renal ROS  negative genitourinary   Musculoskeletal   Abdominal   Peds  Hematology negative hematology ROS (+)   Anesthesia Other Findings Past Medical History: No date: Allergy No date: Anxiety No date: Atrial fibrillation ([facility]) No date: Back pain No date: Bilateral swelling of feet No date: Bursitis 09/07/2015: Cervical cancer screening     Comment:  Menarche at 11 Irregular and heavy and painful flow               secondary to fibroids No history of abnormal pap in past,              last pap roughly 2003 G2P2, s/p 2 SVD history of abnormal              MGM, 1 abnl bx right breast benign, normal otherwise               Noconcerns today TAH b/l SPO for fibroids and               migrainesand breast bx on right gyn surgeries No date: Constipation 08/13/2015: Cough No date: Depression 06/29/2016: Depression with anxiety No date: Fainting     Comment:  once to due heart out of rythm No date: Fibromyalgia No date: Frequent headaches No date: GERD (gastroesophageal reflux disease) 06/03/2017: Hair loss disorder No date: Heartburn No date: History of blood  clots No date: History of chicken pox No date: Hx of blood clots     Comment:  dvt No date: Hyperlipidemia No date: Hyperplastic colon polyp No date: Hypertension No date: Hypothyroid 08/13/2015: Insomnia No date: Internal hemorrhoids No date: Interstitial cystitis No date: Joint pain No date: Lesion of lung     Comment:  xray in 2014 thought to be benign seen at [facility]               pulmonology No date: Measles     Comment:  h/o No date: Migraine No date: Obesity No date: Osteoarthritis 02/03/2017: Preventative health care No date: Raynaud's disease 03/19/2015: S/P AVR (aortic valve replacement) 06/03/2017: Shoulder pain No date: UTI (lower urinary tract infection) 02/11/2016: UTI (urinary tract infection)  Past Surgical History: No date: ABDOMINAL HYSTERECTOMY     Comment:  TAH SPO 2014: AORTIC VALVE REPLACEMENT     Comment:  with [facility] No date: ATRIAL ABLATION SURGERY No date: BLADDER SUSPENSION No date: BREAST BIOPSY; Right     Comment:  Needle Biopsy No date: DILATION AND CURETTAGE OF UTERUS     Comment:  x 3 No date: loop heart No date: SHOULDER SURGERY; Left     Comment:  arthroscopy for spurs No date: TONSILLECTOMY No date: TUBAL LIGATION     Reproductive/Obstetrics  negative OB ROS                            Anesthesia Physical Anesthesia Plan  ASA: II  Anesthesia Plan: General   Post-op Pain Management:    Induction:   PONV Risk Score and Plan: Propofol infusion and TIVA  Airway Management Planned:   Additional Equipment:   Intra-op Plan:   Post-operative Plan:   Informed Consent: I have reviewed the patients History and Physical, chart, labs and discussed the procedure including the risks, benefits and alternatives for the proposed anesthesia with the patient or authorized representative who has indicated his/her understanding and acceptance.     Dental Advisory Given  Plan Discussed with: Anesthesiologist,  CRNA and Surgeon  Anesthesia Plan Comments:        Anesthesia Quick Evaluation

## 2020-12-28 NOTE — Op Note (Signed)
[facility] Patient Name: Alexandra Patterson Procedure Date: 12/28/2020 8:13 AM MRN: [id number] Account #: [id number] Date of Birth: [birth year] Admit Type: Outpatient Age: 65 Room: [facility] ENDO ROOM 2 Gender: Female Note Status: Finalized Procedure:             Colonoscopy Indications:           Screening in patient at increased risk: Family history                         of 1st-degree relative with colorectal cancer Providers:             Chanee Henrickson B. Bonna Gains MD, MD Referring MD:          Bonnita Levan [name] (Referring MD) Medicines:             Monitored Anesthesia Care Complications:         No immediate complications. Procedure:             Pre-Anesthesia Assessment:                        - ASA Grade Assessment: II - A patient with mild                         systemic disease.                        - Prior to the procedure, a History and Physical was                         performed, and patient medications, allergies and                         sensitivities were reviewed. The patient's tolerance                         of previous anesthesia was reviewed.                        - The risks and benefits of the procedure and the                         sedation options and risks were discussed with the                         patient. All questions were answered and informed                         consent was obtained.                        - Patient identification and proposed procedure were                         verified prior to the procedure by the physician, the                         nurse, the anesthesiologist, the anesthetist and the  technician. The procedure was verified in the                         procedure room.                        After obtaining informed consent, the colonoscope was                         passed under direct vision. Throughout the procedure,                         the patient's blood  pressure, pulse, and oxygen                         saturations were monitored continuously. The                         Colonoscope was introduced through the anus and                         advanced to the the cecum, identified by appendiceal                         orifice and ileocecal valve. The colonoscopy was                         performed with ease. The patient tolerated the                         procedure well. The quality of the bowel preparation                         was good. However, there were solid pieces of stool in                         the right colon that were only able to be partially                         removed with suction or with moving with the scope,                         water or snare, to improve visualization. Findings:      The perianal and digital rectal examinations were normal.      A 8 mm polyp was found in the cecum. The polyp was sessile. The polyp       was removed with a cold snare. Resection and retrieval were complete.      The exam was otherwise without abnormality.      The rectum, sigmoid colon, descending colon, transverse colon, ascending       colon and cecum appeared normal.      The retroflexed view of the distal rectum and anal verge was normal and       showed no anal or rectal abnormalities. Impression:            - One 8 mm polyp in the cecum, removed with a cold  snare. Resected and retrieved.                        - The examination was otherwise normal.                        - The rectum, sigmoid colon, descending colon,                         transverse colon, ascending colon and cecum are normal.                        - The distal rectum and anal verge are normal on                         retroflexion view. Recommendation:        - Await pathology results.                        - Discharge patient to home (with escort).                        - Advance diet as tolerated.                         - Continue present medications.                        - - Repeat colonoscopy in 3 years, with low fiber diet                         1 week prior (due to solid pieces of stool in the                         right colon that could only be partially moved/removed                         as detailed under prep above).                        - The findings and recommendations were discussed with                         the patient.                        - The findings and recommendations were discussed with                         the patient's family.                        - Return to primary care physician as previously                         scheduled. Procedure Code(s):     --- Professional ---                        [id number], Colonoscopy, flexible; with removal of  tumor(s), polyp(s), or other lesion(s) by snare                         technique Diagnosis Code(s):     --- Professional ---                        Z80.0, Family history of malignant neoplasm of                         digestive organs                        K63.5, Polyp of colon CPT copyright 2019 American Medical Association. All rights reserved. The codes documented in this report are preliminary and upon coder review may  be revised to meet current compliance requirements.  Vonda Antigua, MD Margretta Sidle B. Bonna Gains MD, MD 12/28/2020 9:13:42 AM This report has been signed electronically. Number of Addenda: 0 Note Initiated On: 12/28/2020 8:13 AM Scope Withdrawal Time: 0 hours 17 minutes 7 seconds  Total Procedure Duration: 0 hours 25 minutes 32 seconds  Estimated Blood Loss:  Estimated blood loss: none.      [facility]

## 2020-12-28 NOTE — Op Note (Signed)
[facility] Patient Name: Alexandra Patterson Procedure Date: 12/28/2020 8:14 AM MRN: [id number] Account #: [id number] Date of Birth: Nov 13, 1955 Admit Type: Outpatient Age: 65 Room: [facility] ENDO ROOM 2 Gender: Female Note Status: Finalized Procedure:             Upper GI endoscopy Indications:           Heartburn, Screening for Barrett's esophagus,                         Screening for Barrett's esophagus in patient at risk                         for this condition Providers:             Meeyah Ovitt B. Bonna Gains MD, MD Referring MD:          Bonnita Levan [name] (Referring MD) Medicines:             Monitored Anesthesia Care Complications:         No immediate complications. Procedure:             Pre-Anesthesia Assessment:                        - Prior to the procedure, a History and Physical was                         performed, and patient medications, allergies and                         sensitivities were reviewed. The patient's tolerance                         of previous anesthesia was reviewed.                        - The risks and benefits of the procedure and the                         sedation options and risks were discussed with the                         patient. All questions were answered and informed                         consent was obtained.                        - Patient identification and proposed procedure were                         verified prior to the procedure by the physician, the                         nurse, the anesthesiologist, the anesthetist and the                         technician. The procedure was verified in the  procedure room.                        - ASA Grade Assessment: II - A patient with mild                         systemic disease.                        After obtaining informed consent, the endoscope was                         passed under direct vision. Throughout the procedure,                          the patient's blood pressure, pulse, and oxygen                         saturations were monitored continuously. The Endoscope                         was introduced through the mouth, and advanced to the                         second part of duodenum. The upper GI endoscopy was                         accomplished with ease. The patient tolerated the                         procedure well. Findings:      Scattered islands of salmon-colored mucosa were present. No other       visible abnormalities were present. The maximum longitudinal extent of       these esophageal mucosal changes was 1 cm in length.      The exam of the esophagus was otherwise normal.      Patchy mildly erythematous mucosa without bleeding was found in the       gastric antrum. Biopsies were taken with a cold forceps for histology.       Biopsies were obtained in the gastric body, at the incisura and in the       gastric antrum with cold forceps for histology.      A small hiatal hernia was present.      Multiple 3 to 5 mm sessile polyps with no bleeding and no stigmata of       recent bleeding were found in the gastric fundus and in the gastric       body. Biopsies were taken with a cold forceps for histology. Imaging was       performed using narrow band imaging to visualize the mucosa.      The exam of the stomach was otherwise normal.      The duodenal bulb, second portion of the duodenum and examined duodenum       were normal. Impression:            - Salmon-colored mucosa suspicious for short-segment                         Barrett's esophagus.                        -  Erythematous mucosa in the antrum. Biopsied.                        - Small hiatal hernia.                        - Multiple gastric polyps. Biopsied.                        - Normal duodenal bulb, second portion of the duodenum                         and examined duodenum.                        - Biopsies were  obtained in the gastric body, at the                         incisura and in the gastric antrum. Recommendation:        - Await pathology results.                        - Discharge patient to home (with escort).                        - Advance diet as tolerated.                        - Continue present medications.                        - Patient has a contact number available for                         emergencies. The signs and symptoms of potential                         delayed complications were discussed with the patient.                         Return to normal activities tomorrow. Written                         discharge instructions were provided to the patient.                        - Discharge patient to home (with escort).                        - The findings and recommendations were discussed with                         the patient.                        - The findings and recommendations were discussed with                         the patient's family.                        -  Follow an antireflux regimen. Procedure Code(s):     --- Professional ---                        [phone], Esophagogastroduodenoscopy, flexible,                         transoral; with biopsy, single or multiple Diagnosis Code(s):     --- Professional ---                        K22.8, Other specified diseases of esophagus                        K31.89, Other diseases of stomach and duodenum                        K44.9, Diaphragmatic hernia without obstruction or                         gangrene                        K31.7, Polyp of stomach and duodenum                        R12, Heartburn                        Z13.810, Encounter for screening for upper                         gastrointestinal disorder CPT copyright 2019 American Medical Association. All rights reserved. The codes documented in this report are preliminary and upon coder review may  be revised to meet current compliance  requirements.  Vonda Antigua, MD Margretta Sidle B. Bonna Gains MD, MD 12/28/2020 8:38:10 AM This report has been signed electronically. Number of Addenda: 0 Note Initiated On: 12/28/2020 8:14 AM Estimated Blood Loss:  Estimated blood loss: none.      [facility]

## 2020-12-28 NOTE — Transfer of Care (Signed)
Immediate Anesthesia Transfer of Care Note  Patient: MONQUE HAGGAR  Procedure(s) Performed: ESOPHAGOGASTRODUODENOSCOPY (EGD) WITH PROPOFOL (N/A ) COLONOSCOPY WITH PROPOFOL (N/A )  Patient Location: Endoscopy Unit  Anesthesia Type:General  Level of Consciousness: awake, drowsy and patient cooperative  Airway & Oxygen Therapy: Patient Spontanous Breathing and Patient connected to face mask oxygen  Post-op Assessment: Report given to RN and Post -op Vital signs reviewed and stable  Post vital signs: Reviewed and stable  Last Vitals:  Vitals Value Taken Time  BP 131/77 12/28/20 0909  Temp    Pulse 88 12/28/20 0912  Resp 16 12/28/20 0912  SpO2 100 % 12/28/20 0912  Vitals shown include unvalidated device data.  Last Pain:  Vitals:   12/28/20 0909  TempSrc:   PainSc: 0-No pain         Complications: No complications documented.

## 2020-12-28 NOTE — Anesthesia Postprocedure Evaluation (Signed)
Anesthesia Post Note  Patient: Alexandra Patterson  Procedure(s) Performed: ESOPHAGOGASTRODUODENOSCOPY (EGD) WITH PROPOFOL (N/A ) COLONOSCOPY WITH PROPOFOL (N/A )  Patient location during evaluation: PACU Anesthesia Type: General Level of consciousness: awake and alert Pain management: pain level controlled Vital Signs Assessment: post-procedure vital signs reviewed and stable Respiratory status: spontaneous breathing, nonlabored ventilation and respiratory function stable Cardiovascular status: blood pressure returned to baseline and stable Postop Assessment: no apparent nausea or vomiting Anesthetic complications: no   No complications documented.   Last Vitals:  Vitals:   12/28/20 0939 12/28/20 0949  BP: 127/86 (!) 149/72  Pulse: 76 73  Resp: 13 15  Temp:    SpO2: 99% 99%    Last Pain:  Vitals:   12/28/20 0949  TempSrc:   PainSc: 0-No pain                 Tera Mater

## 2020-12-28 NOTE — Anesthesia Procedure Notes (Signed)
Procedure Name: General with mask airway Performed by: Fletcher-Harrison, Kylyn Sookram, CRNA Pre-anesthesia Checklist: Patient identified, Emergency Drugs available, Suction available and Patient being monitored Patient Re-evaluated:Patient Re-evaluated prior to induction Oxygen Delivery Method: Simple face mask Induction Type: IV induction Placement Confirmation: positive ETCO2 and CO2 detector Dental Injury: Teeth and Oropharynx as per pre-operative assessment        

## 2020-12-28 NOTE — H&P (Signed)
Alexandra Antigua, MD [address] [place], [place], [place], [address] [address], [place], [place], [place], [address] Phone: [phone]  Fax: [phone]  Primary Care Physician:  [name], MD   Pre-Procedure History & Physical: HPI:  Alexandra Patterson is a 65 y.o. female is here for a colonoscopy and EGD.   Past Medical History:  Diagnosis Date  . Allergy   . Anxiety   . Atrial fibrillation ([place])   . Back pain   . Bilateral swelling of feet   . Bursitis   . Cervical cancer screening 09/07/2015   Menarche at 11 Irregular and heavy and painful flow secondary to fibroids No history of abnormal pap in past, last pap roughly 2003 G2P2, s/p 2 SVD history of abnormal MGM, 1 abnl bx right breast benign, normal otherwise Noconcerns today TAH b/l SPO for fibroids and migrainesand breast bx on right gyn surgeries  . Constipation   . Cough 08/13/2015  . Depression   . Depression with anxiety 06/29/2016  . Fainting    once to due heart out of rythm  . Fibromyalgia   . Frequent headaches   . GERD (gastroesophageal reflux disease)   . 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 Heartburn   . History of blood clots   . History of chicken pox   . Hx of blood clots    dvt  . Hyperlipidemia   . Hyperplastic colon polyp   . Hypertension   . Hypothyroid   . Insomnia 08/13/2015  . Internal hemorrhoids   . Interstitial cystitis   . Joint pain   . Lesion of lung    xray in 2014 thought to be benign seen at [facility] pulmonology  . Measles    h/o  . Migraine   . Obesity   . Osteoarthritis   . Preventative health care 02/03/2017  . Raynaud's disease   . S/P AVR (aortic valve replacement) 03/19/2015  . Shoulder pain 06/03/2017  . UTI (lower urinary tract infection)   . UTI (urinary tract infection) 02/11/2016    Past Surgical History:  Procedure Laterality Date  . ABDOMINAL HYSTERECTOMY     TAH SPO  . AORTIC VALVE REPLACEMENT  2014   with [facility]  . ATRIAL ABLATION SURGERY    .  BLADDER SUSPENSION    . BREAST BIOPSY Right    Needle Biopsy  . DILATION AND CURETTAGE OF UTERUS     x 3  . loop heart    . SHOULDER SURGERY Left    arthroscopy for spurs  . TONSILLECTOMY    . TUBAL LIGATION      Prior to Admission medications   Medication Sig Start Date End Date Taking? Authorizing Provider  amitriptyline (ELAVIL) 25 MG tablet Take 1 tablet (25 mg total) by mouth at bedtime. 11/17/18   [name], MD  atorvastatin (LIPITOR) 40 MG tablet Take 1 tablet (40 mg total) by mouth at bedtime. 09/21/20   [name], MD  busPIRone (BUSPAR) 10 MG tablet Take 1 tablet (10 mg total) by mouth 2 (two) times daily. 11/01/20   [name], MD  cyclobenzaprine (FLEXERIL) 5 MG tablet Take 5 mg by mouth 3 (three) times daily as needed for muscle spasms.    [provider]  DULoxetine (CYMBALTA) 60 MG capsule Take 2 capsules (120 mg total) by mouth daily. 09/21/20   [name], MD  esomeprazole (NEXIUM) 40 MG capsule Take 40 mg by mouth 2 (two) times daily  before a meal.     [provider]  famotidine (PEPCID) 40 MG tablet Take 1 tablet (40 mg total) by mouth at bedtime as needed for heartburn or indigestion. 12/14/20   [name], MD  HYDROcodone-acetaminophen (NORCO) 7.5-325 MG tablet Take 1 tablet by mouth every 6 (six) hours as needed for moderate pain. 12/14/20   [name], MD  hydrOXYzine (ATARAX/VISTARIL) 25 MG tablet Take 1 tablet (25 mg total) by mouth at bedtime as needed. 11/18/19   [name], MD  levothyroxine (SYNTHROID) 125 MCG tablet Take 125 mcg by mouth. Daily 6 times a week.    [provider]  losartan (COZAAR) 100 MG tablet Take 1 tablet (100 mg total) by mouth every morning. 09/21/20   [name], MD  Magnesium 400 MG CAPS Take 400 mg by mouth daily. Reported on 02/13/2016    [provider]  meloxicam (MOBIC) 15 MG tablet Take 1 tablet (15 mg total) by mouth daily as needed for pain. 09/21/20   [name], MD  Methen-Hyosc-Meth Blue-Na Phos (UROGESIC-BLUE) 81.6 MG TABS Take 1 tablet (81.6 mg total) by mouth 4 (four) times daily as needed. 11/17/18   [name], MD  metoprolol succinate (TOPROL-XL) 100 MG 24 hr tablet Take 1 tablet (100 mg total) by mouth every morning. 10/20/20   [name], MD  metoprolol succinate (TOPROL-XL) 50 MG 24 hr tablet Take 1 tablet (50 mg total) by mouth every morning. 10/20/20   [name], MD  Misc Natural Products (FIBER 7 PO) Take by mouth.    [provider]  Na Sulfate-K Sulfate-Mg Sulf 17.5-3.13-1.6 GM/177ML SOLN At 5 PM the day before procedure take 1 bottle and 5 hours before procedure take 1 bottle. 12/19/20   Virgel Manifold, MD  ondansetron (ZOFRAN) 4 MG tablet Take 4 mg by mouth every 8 (eight) hours as needed for nausea or vomiting.    [provider]  rizatriptan (MAXALT) 10 MG tablet Take 10 mg by mouth as needed for migraine. May repeat in 2 hours if needed    [provider]  topiramate (TOPAMAX) 100 MG tablet Take 100 mg by mouth daily.    [provider]    Allergies as of 12/19/2020 - Review Complete 12/19/2020  Allergen Reaction Noted  . Ciprofloxacin Rash and Shortness Of Breath 01/04/2016  . Sulfa antibiotics Hives 03/13/2015  . Sulfasalazine Hives 03/13/2015  . Cortisone Other (See Comments) 08/16/2015  . Tramadol Other (See Comments) 01/04/2016  . Zonisamide Other (See Comments) 01/04/2016  . Erythromycin Rash 03/10/2012  . Erythromycin base Rash 07/01/2011  . Levofloxacin Nausea Only 01/04/2016  . Lisinopril  01/10/2016  . Prednisone Rash and Anxiety 07/01/2011    Family History  Problem Relation Age of Onset  . Colon polyps Father   . Alzheimer's disease Father   . Alcohol abuse Father   . Cancer Father        colon cancer  . Arthritis Mother        s/p TKR  . Neuropathy Mother   . Hyperlipidemia Mother   . Other Mother        familial mediterranean fever  .  Thyroid disease Mother   . Hypertension Mother   . Obesity Mother   . Heart disease Mother   . Cancer Brother        prostate cancer  . Heart disease Brother        cardiomegaly  .  Other Brother        familial mediterranean fever  . Asthma Maternal Grandmother   . Congestive Heart Failure Maternal Grandmother   . Heart disease Maternal Grandmother        chf  . Stroke Maternal Grandfather   . Diabetes Maternal Grandfather   . Heart disease Maternal Grandfather        hardening of the arteries  . Stroke Paternal Grandfather   . Atrial fibrillation Paternal Grandfather   . Alcohol abuse Paternal Grandfather   . Heart disease Paternal Grandfather        afib  . Interstitial cystitis Daughter   . Arthritis Son   . Alcohol abuse Son        in remission  . Mental illness Son        depression  . Other Son        interstitial cystitis  . Colon cancer Other        parent  . Arthritis Paternal Uncle     Social History   Socioeconomic History  . Marital status: Married    Spouse name: Not on file  . Number of children: 2  . Years of education: Not on file  . Highest education level: Not on file  Occupational History  . Occupation: Best boy: [employer]  Tobacco Use  . Smoking status: Never Smoker  . Smokeless tobacco: Never Used  Substance and Sexual Activity  . Alcohol use: No  . Drug use: No  . Sexual activity: Yes    Comment: lives with husband and adult son, no major dietary restrictions, full diability  Other Topics Concern  . Not on file  Social History Narrative  . Not on file   Social Determinants of Health   Financial Resource Strain: Not on file  Food Insecurity: Not on file  Transportation Needs: Not on file  Physical Activity: Not on file  Stress: Not on file  Social Connections: Not on file  Intimate Partner Violence: Not on file    Review of Systems: See HPI, otherwise negative ROS  Physical Exam: There were no  vitals taken for this visit. General:   Alert,  pleasant and cooperative in NAD Head:  Normocephalic and atraumatic. Neck:  Supple; no masses or thyromegaly. Lungs:  Clear throughout to auscultation, normal respiratory effort.    Heart:  +S1, +S2, Regular rate and rhythm, No edema. Abdomen:  Soft, nontender and nondistended. Normal bowel sounds, without guarding, and without rebound.   Neurologic:  Alert and  oriented x4;  grossly normal neurologically.  Impression/Plan: Alexandra Patterson is here for a colonoscopy to be performed for family history of colon cancer and EGD for Acid Reflux.  Risks, benefits, limitations, and alternatives regarding the procedures have been reviewed with the patient.  Questions have been answered.  All parties agreeable.   Virgel Manifold, MD  12/28/2020, 7:37 AM

## 2020-12-29 LAB — SURGICAL PATHOLOGY

## 2021-01-04 ENCOUNTER — Encounter: Payer: Self-pay | Admitting: Gastroenterology

## 2021-01-16 ENCOUNTER — Telehealth (INDEPENDENT_AMBULATORY_CARE_PROVIDER_SITE_OTHER): Payer: PPO | Admitting: Gastroenterology

## 2021-01-16 DIAGNOSIS — K219 Gastro-esophageal reflux disease without esophagitis: Secondary | ICD-10-CM | POA: Diagnosis not present

## 2021-01-16 NOTE — Progress Notes (Signed)
Vonda Antigua, MD [address]  [place]  [place], [place] [address]  Main: [phone]  Fax: [phone]   Primary Care Physician: [name], MD  Virtual Visit via Video Note  I connected with patient on 01/16/21 at  3:00 PM EDT by video and verified that I am speaking with the correct person using two identifiers.   I discussed the limitations, risks, security and privacy concerns of performing an evaluation and management service by video and the availability of in person appointments. I also discussed with the patient that there may be a patient responsible charge related to this service. The patient expressed understanding and agreed to proceed.  Location of Patient: Home Location of Provider: Home Persons involved: Patient and provider only (Nursing staff checked in patient via phone but were not physically involved in the video interaction - see their notes)   History of Present Illness: Chief complaint: Heartburn  HPI: Alexandra Patterson is a 65 y.o. female for follow-up of reflux.  Patient continues to report daily heartburn symptoms despite taking PPI daily.  Despite patient's antireflux lifestyle measures and using beverage at night.  Denies any dysphagia.  EGD did show a small hiatal hernia.  Biopsies from the distal esophagus did not show any intestinal metaplasia or dysplasia  Current Outpatient Medications  Medication Sig Dispense Refill  . amitriptyline (ELAVIL) 25 MG tablet Take 1 tablet (25 mg total) by mouth at bedtime.    Marland Kitchen atorvastatin (LIPITOR) 40 MG tablet Take 1 tablet (40 mg total) by mouth at bedtime. 90 tablet 1  . busPIRone (BUSPAR) 10 MG tablet Take 1 tablet (10 mg total) by mouth 2 (two) times daily. 180 tablet 1  . cyclobenzaprine (FLEXERIL) 5 MG tablet Take 5 mg by mouth 3 (three) times daily as needed for muscle spasms.    . DULoxetine (CYMBALTA) 60 MG capsule Take 2 capsules (120 mg total) by mouth daily. 180 capsule 1  . esomeprazole  (NEXIUM) 40 MG capsule Take 40 mg by mouth 2 (two) times daily before a meal.     . famotidine (PEPCID) 40 MG tablet Take 1 tablet (40 mg total) by mouth at bedtime as needed for heartburn or indigestion. 30 tablet 3  . HYDROcodone-acetaminophen (NORCO) 7.5-325 MG tablet Take 1 tablet by mouth every 6 (six) hours as needed for moderate pain. 60 tablet 0  . hydrOXYzine (ATARAX/VISTARIL) 25 MG tablet Take 1 tablet (25 mg total) by mouth at bedtime as needed. 90 tablet 1  . levothyroxine (SYNTHROID) 125 MCG tablet Take 125 mcg by mouth. Daily 6 times a week.    . losartan (COZAAR) 100 MG tablet Take 1 tablet (100 mg total) by mouth every morning. 90 tablet 1  . Magnesium 400 MG CAPS Take 400 mg by mouth daily. Reported on 02/13/2016    . meloxicam (MOBIC) 15 MG tablet Take 1 tablet (15 mg total) by mouth daily as needed for pain. 90 tablet 1  . Methen-Hyosc-Meth Blue-Na Phos (UROGESIC-BLUE) 81.6 MG TABS Take 1 tablet (81.6 mg total) by mouth 4 (four) times daily as needed.    . metoprolol succinate (TOPROL-XL) 100 MG 24 hr tablet Take 1 tablet (100 mg total) by mouth every morning. 90 tablet 1  . metoprolol succinate (TOPROL-XL) 50 MG 24 hr tablet Take 1 tablet (50 mg total) by mouth every morning. 90 tablet 1  . Misc Natural Products (FIBER 7 PO) Take by mouth.    . Na Sulfate-K Sulfate-Mg Sulf  17.5-3.13-1.6 GM/177ML SOLN At 5 PM the day before procedure take 1 bottle and 5 hours before procedure take 1 bottle. 354 mL 0  . ondansetron (ZOFRAN) 4 MG tablet Take 4 mg by mouth every 8 (eight) hours as needed for nausea or vomiting.    . rizatriptan (MAXALT) 10 MG tablet Take 10 mg by mouth as needed for migraine. May repeat in 2 hours if needed    . topiramate (TOPAMAX) 100 MG tablet Take 100 mg by mouth daily.     No current facility-administered medications for this visit.    Allergies as of 01/16/2021 - Review Complete 12/28/2020  Allergen Reaction Noted  . Ciprofloxacin Rash and Shortness Of  Breath 01/04/2016  . Sulfa antibiotics Hives 03/13/2015  . Sulfasalazine Hives 03/13/2015  . Cortisone Other (See Comments) 08/16/2015  . Tramadol Other (See Comments) 01/04/2016  . Zonisamide Other (See Comments) 01/04/2016  . Erythromycin Rash 03/10/2012  . Erythromycin base Rash 07/01/2011  . Levofloxacin Nausea Only 01/04/2016  . Lisinopril  01/10/2016  . Prednisone Rash and Anxiety 07/01/2011    Review of Systems:    All systems reviewed and negative except where noted in HPI.   Observations/Objective:  Labs: CMP     Component Value Date/Time   NA 138 12/14/2020 1409   NA 127 (L) 01/24/2020 1255   K 4.6 12/14/2020 1409   CL 103 12/14/2020 1409   CO2 25 12/14/2020 1409   GLUCOSE 93 12/14/2020 1409   BUN 22 12/14/2020 1409   BUN 13 01/24/2020 1255   CREATININE 1.10 12/14/2020 1409   CREATININE 0.99 07/27/2020 1146   CALCIUM 9.4 12/14/2020 1409   PROT 6.1 12/14/2020 1409   PROT 6.3 01/24/2020 1255   ALBUMIN 4.0 12/14/2020 1409   ALBUMIN 4.4 01/24/2020 1255   AST 14 12/14/2020 1409   ALT 14 12/14/2020 1409   ALKPHOS 71 12/14/2020 1409   BILITOT 0.6 12/14/2020 1409   BILITOT 0.4 01/24/2020 1255   GFRNONAA 77 01/24/2020 1255   GFRAA 89 01/24/2020 1255   Lab Results  Component Value Date   WBC 6.9 12/14/2020   HGB 14.3 12/14/2020   HCT 42.3 12/14/2020   MCV 90.9 12/14/2020   PLT 185.0 12/14/2020    Imaging Studies: No results found.  Assessment and Plan:   Alexandra Patterson is a 65 y.o. y/o female here for follow-up of reflux  Assessment and Plan: Daily ongoing symptoms despite PPI therapy  Patient interested in referral for hiatal hernia surgery.  Will refer at this time  Patient educated extensively on acid reflux lifestyle modification, including buying a bed wedge, not eating 3 hrs before bedtime, diet modifications, and handout given for the same.    Follow Up Instructions:    I discussed the assessment and treatment plan with the patient. The  patient was provided an opportunity to ask questions and all were answered. The patient agreed with the plan and demonstrated an understanding of the instructions.   The patient was advised to call back or seek an in-person evaluation if the symptoms worsen or if the condition fails to improve as anticipated.  I provided 15 minutes of face-to-face time via video software during this encounter. Additional time was spent in reviewing patient's chart, placing orders etc.   Virgel Manifold, MD  Speech recognition software was used to dictate this note.

## 2021-01-17 DIAGNOSIS — I4819 Other persistent atrial fibrillation: Secondary | ICD-10-CM | POA: Diagnosis not present

## 2021-01-17 DIAGNOSIS — Z79899 Other long term (current) drug therapy: Secondary | ICD-10-CM | POA: Diagnosis not present

## 2021-01-17 DIAGNOSIS — I350 Nonrheumatic aortic (valve) stenosis: Secondary | ICD-10-CM | POA: Diagnosis not present

## 2021-01-17 DIAGNOSIS — I480 Paroxysmal atrial fibrillation: Secondary | ICD-10-CM | POA: Diagnosis not present

## 2021-01-17 DIAGNOSIS — Z9889 Other specified postprocedural states: Secondary | ICD-10-CM | POA: Diagnosis not present

## 2021-01-17 DIAGNOSIS — Z95818 Presence of other cardiac implants and grafts: Secondary | ICD-10-CM | POA: Diagnosis not present

## 2021-01-17 DIAGNOSIS — Z952 Presence of prosthetic heart valve: Secondary | ICD-10-CM | POA: Diagnosis not present

## 2021-01-18 ENCOUNTER — Encounter: Payer: Self-pay | Admitting: Family Medicine

## 2021-01-18 DIAGNOSIS — M5441 Lumbago with sciatica, right side: Secondary | ICD-10-CM | POA: Diagnosis not present

## 2021-01-18 DIAGNOSIS — G8929 Other chronic pain: Secondary | ICD-10-CM | POA: Diagnosis not present

## 2021-01-18 DIAGNOSIS — M5442 Lumbago with sciatica, left side: Secondary | ICD-10-CM | POA: Diagnosis not present

## 2021-01-18 DIAGNOSIS — M545 Low back pain, unspecified: Secondary | ICD-10-CM | POA: Diagnosis not present

## 2021-01-19 ENCOUNTER — Other Ambulatory Visit: Payer: Self-pay | Admitting: Family Medicine

## 2021-01-19 DIAGNOSIS — E061 Subacute thyroiditis: Secondary | ICD-10-CM

## 2021-01-19 MED ORDER — HYDROCODONE-ACETAMINOPHEN 7.5-325 MG PO TABS: 1.0000 | ORAL_TABLET | Freq: Four times a day (QID) | ORAL | 0 refills | Status: DC | PRN

## 2021-01-19 NOTE — Telephone Encounter (Signed)
Requesting: hydrocodone 7.5-325mg  Contract: 02/17/2020 UDS: 02/17/2020 Last Visit: 12/14/20 Next Visit: 03/26/21 Last Refill: 12/14/20 #60 and 0RF  Please Advise

## 2021-01-22 ENCOUNTER — Telehealth: Payer: Self-pay | Admitting: Pharmacist

## 2021-01-22 NOTE — Progress Notes (Addendum)
Chronic Care Management Pharmacy Assistant   Name: Alexandra Patterson  MRN: [id number] DOB: 02-09-1956  Reason for Encounter: Medication Review/Medication Coordination Call.  Medications: Outpatient Encounter Medications as of 01/22/2021  Medication Sig Note   amitriptyline (ELAVIL) 25 MG tablet Take 1 tablet (25 mg total) by mouth at bedtime.    atorvastatin (LIPITOR) 40 MG tablet Take 1 tablet (40 mg total) by mouth at bedtime.    busPIRone (BUSPAR) 10 MG tablet Take 1 tablet (10 mg total) by mouth 2 (two) times daily.    cyclobenzaprine (FLEXERIL) 5 MG tablet Take 5 mg by mouth 3 (three) times daily as needed for muscle spasms.    DULoxetine (CYMBALTA) 60 MG capsule Take 2 capsules (120 mg total) by mouth daily.    esomeprazole (NEXIUM) 40 MG capsule Take 40 mg by mouth 2 (two) times daily before a meal.  07/31/2020: Gets from [facility]   famotidine (PEPCID) 40 MG tablet Take 1 tablet (40 mg total) by mouth at bedtime as needed for heartburn or indigestion.    HYDROcodone-acetaminophen (NORCO) 7.5-325 MG tablet Take 1 tablet by mouth every 6 (six) hours as needed for moderate pain.    hydrOXYzine (ATARAX/VISTARIL) 25 MG tablet Take 1 tablet (25 mg total) by mouth at bedtime as needed.    levothyroxine (SYNTHROID) 125 MCG tablet TAKE ONE TABLET BY MOUTH ONCE WEEKLY BEFORE BREAKFAST ON MONDAY and TAKE ONE TABLET BY MOUTH ONCE WEEKLY BEFOR BREAKFAST ON  TUESDAYS and TAKE ONE TABLET BY MOUTH ONCE WEEKLY BEFORE BREAKFAST ON WEDNESDAYS and TAKE ONE TABLET BY MOUTH ONCE WEEKLY BEFORE BREAKFAST ON THURSDAYS and TAKE ONE TABLET BY MOUTH ONCE WEEKLY BEFORE BREAKFAST ON FRIDAYS and TAKE ONE TABLET BY MOUTH satur(acb)    losartan (COZAAR) 100 MG tablet Take 1 tablet (100 mg total) by mouth every morning.    Magnesium 400 MG CAPS Take 400 mg by mouth daily. Reported on 02/13/2016    meloxicam (MOBIC) 15 MG tablet Take 1 tablet (15 mg total) by mouth daily as needed for pain.    Methen-Hyosc-Meth Blue-Na Phos  (UROGESIC-BLUE) 81.6 MG TABS Take 1 tablet (81.6 mg total) by mouth 4 (four) times daily as needed.    metoprolol succinate (TOPROL-XL) 100 MG 24 hr tablet Take 1 tablet (100 mg total) by mouth every morning.    metoprolol succinate (TOPROL-XL) 50 MG 24 hr tablet Take 1 tablet (50 mg total) by mouth every morning.    Misc Natural Products (FIBER 7 PO) Take by mouth.    Na Sulfate-K Sulfate-Mg Sulf 17.5-3.13-1.6 GM/177ML SOLN At 5 PM the day before procedure take 1 bottle and 5 hours before procedure take 1 bottle.    ondansetron (ZOFRAN) 4 MG tablet Take 4 mg by mouth every 8 (eight) hours as needed for nausea or vomiting.    rizatriptan (MAXALT) 10 MG tablet Take 10 mg by mouth as needed for migraine. May repeat in 2 hours if needed    topiramate (TOPAMAX) 100 MG tablet Take 100 mg by mouth daily.    No facility-administered encounter medications on file as of 01/22/2021.    Reviewed chart for medication changes ahead of medication coordination call.  No OVs visits since last care coordination call.  Hospital Visits: 12/28/20 [facility] for 3 hours. Virgel Manifold, MD. For Colonoscopy and ESOPHAGOGASTRODUODENOSCOPY.  Consults Visits: 01/17/21 Cardiology [facility] Melter. No information available. 01/16/21 Odessa Fleming, MD. For GERD/Heartburn. No medication changes.   No medication changes  indicated.  BP Readings from Last 3 Encounters:  12/28/20 (!) 149/72  12/19/20 (!) 161/100  12/14/20 128/72    Lab Results  Component Value Date   HGBA1C 5.2 07/27/2020     Patient obtains medications through Adherence Packaging  90 Days   Last adherence delivery included: (90 DS 10/30/20) Buspirone 10 mg; one tab at [facility] at Bedtime Amitriptyline 25 mg; one tab at Bedtime Hydroxyzine Hcl 25 mg; two tabs at Bedtime Losartan 100 mg; one tab at Breakfast Levothyroxine 125 mcg; one tab before Breakfast  Duloxetine 60 mg; two tabs at  Breakfast Meloxicam 15 mg; one tab at Breakfast Atorvastatin 40 mg; one tab at Bedtime Metoprolol Succinate 50 mg; one tab with Breakfast Metoprolol Succinate 100 mg; one tab at Breakfast Topiramate 100 mg; one tab at Breakfast, one at Bedtime  Patient declined meds last month: Cyclobenzaprine 5 mg tab twice a day as needed for muscle spasms. Buspirone 10 mg; one tab at [facility] at Bedtime Amitriptyline 25 mg; one tab at Bedtime Hydroxyzine Hcl 25 mg; two tabs at Bedtime Losartan 100 mg; one tab at Breakfast Levothyroxine 125 mcg; one tab before Breakfast  Duloxetine 60 mg; two tabs at Breakfast Meloxicam 15 mg; one tab at Breakfast Atorvastatin 40 mg; one tab at Bedtime Metoprolol Succinate 50 mg; one tab with Breakfast Metoprolol Succinate 100 mg; one tab at Breakfast Topiramate 100 mg; one tab at breakfast  Patient is due for next adherence delivery on: 01/26/21.  Called patient and reviewed medications and coordinated delivery.  This delivery to include:  Cyclobenzaprine 5 mg tab twice a day as needed for muscle spasms. Buspirone 10 mg; one tab at [facility] at Bedtime Amitriptyline 25 mg; one tab at Bedtime Hydroxyzine Hcl 25 mg; two tabs at Bedtime Losartan 100 mg; one tab at Breakfast Levothyroxine 125 mcg; one tab before Breakfast  Duloxetine 60 mg; two tabs at Breakfast Meloxicam 15 mg; one tab at Breakfast Atorvastatin 40 mg; one tab at Bedtime Metoprolol Succinate 50 mg; one tab with Breakfast Metoprolol Succinate 100 mg; one tab at Breakfast Topiramate 100 mg; one tab at at bedtime  Patient declined the following medications: None  Patient does not need refills at this time.   Confirmed delivery date of 01/26/21, advised patient that pharmacy will contact them the morning of delivery.  Follow-Up:Pharmacist Review  Alexandra Patterson, Alexandra Patterson Clinical Pharmacist Assistant [phone]  10 minutes spent in review, coordination, and  documentation.  Reviewed by: Beverly Milch, PharmD Clinical Pharmacist [facility] [phone]

## 2021-02-01 DIAGNOSIS — G43109 Migraine with aura, not intractable, without status migrainosus: Secondary | ICD-10-CM | POA: Diagnosis not present

## 2021-02-09 DIAGNOSIS — G43009 Migraine without aura, not intractable, without status migrainosus: Secondary | ICD-10-CM | POA: Diagnosis not present

## 2021-02-12 DIAGNOSIS — M7062 Trochanteric bursitis, left hip: Secondary | ICD-10-CM | POA: Diagnosis not present

## 2021-02-19 DIAGNOSIS — Z4509 Encounter for adjustment and management of other cardiac device: Secondary | ICD-10-CM | POA: Diagnosis not present

## 2021-02-19 DIAGNOSIS — I4891 Unspecified atrial fibrillation: Secondary | ICD-10-CM | POA: Diagnosis not present

## 2021-02-25 ENCOUNTER — Encounter: Payer: Self-pay | Admitting: Family Medicine

## 2021-02-27 ENCOUNTER — Other Ambulatory Visit: Payer: Self-pay | Admitting: Family Medicine

## 2021-02-27 MED ORDER — HYDROCODONE-ACETAMINOPHEN 7.5-325 MG PO TABS: 1.0000 | ORAL_TABLET | Freq: Four times a day (QID) | ORAL | 0 refills | Status: DC | PRN

## 2021-02-27 NOTE — Telephone Encounter (Signed)
Requesting:  hydrocone Contract: 02/2020 UDS:02/17/2020 Last Visit:12/14/2020  Next Visit:03/26/2021 Last Refill:01/19/2021  Please Advise

## 2021-02-28 ENCOUNTER — Other Ambulatory Visit: Payer: Self-pay

## 2021-02-28 DIAGNOSIS — K449 Diaphragmatic hernia without obstruction or gangrene: Secondary | ICD-10-CM

## 2021-03-01 ENCOUNTER — Ambulatory Visit (INDEPENDENT_AMBULATORY_CARE_PROVIDER_SITE_OTHER): Payer: PPO | Admitting: Family Medicine

## 2021-03-01 ENCOUNTER — Other Ambulatory Visit: Payer: Self-pay

## 2021-03-01 ENCOUNTER — Encounter: Payer: Self-pay | Admitting: Family Medicine

## 2021-03-01 ENCOUNTER — Other Ambulatory Visit: Payer: Self-pay | Admitting: Family Medicine

## 2021-03-01 VITALS — BP 132/90 | HR 100 | Temp 98.3°F | Resp 18 | Wt 269.0 lb

## 2021-03-01 DIAGNOSIS — N3000 Acute cystitis without hematuria: Secondary | ICD-10-CM

## 2021-03-01 DIAGNOSIS — R350 Frequency of micturition: Secondary | ICD-10-CM

## 2021-03-01 DIAGNOSIS — Z6836 Body mass index (BMI) 36.0-36.9, adult: Secondary | ICD-10-CM

## 2021-03-01 DIAGNOSIS — I10 Essential (primary) hypertension: Secondary | ICD-10-CM

## 2021-03-01 DIAGNOSIS — E559 Vitamin D deficiency, unspecified: Secondary | ICD-10-CM

## 2021-03-01 DIAGNOSIS — F418 Other specified anxiety disorders: Secondary | ICD-10-CM

## 2021-03-01 DIAGNOSIS — G43809 Other migraine, not intractable, without status migrainosus: Secondary | ICD-10-CM

## 2021-03-01 LAB — POC URINALSYSI DIPSTICK (AUTOMATED)
Bilirubin, UA: POSITIVE
Blood, UA: NEGATIVE
Glucose, UA: NEGATIVE
Ketones, UA: POSITIVE
Leukocytes, UA: NEGATIVE
Nitrite, UA: NEGATIVE
Protein, UA: NEGATIVE
Spec Grav, UA: >=1.03 — AB (ref 1.010–1.025)
Urobilinogen, UA: 0.2 E.U./dL
pH, UA: 6 (ref 5.0–8.0)

## 2021-03-01 MED ORDER — DIAZEPAM 2 MG PO TABS: 1.0000 mg | ORAL_TABLET | Freq: Two times a day (BID) | ORAL | 0 refills | Status: DC | PRN

## 2021-03-01 MED ORDER — WEGOVY 0.25 MG/0.5ML ~~LOC~~ SOAJ: 0.2500 mg | SUBCUTANEOUS | 0 refills | Status: DC

## 2021-03-01 NOTE — Progress Notes (Signed)
Patient ID: Alexandra Patterson, female    DOB: [birth year]  Age: 65 y.o. MRN: [id number]    Subjective:  Subjective  HPI Alexandra Patterson presents for office visit today for follow up on migraines and HTN. She states that she has no recent sicknesses or recent ER visits to report. She reports that her migraines are getting worse and states that yesterday it got bad to the point of her feeling nauseas and almost vomiting. She denies any chest pain, SOB, fever, abdominal pain, cough, chills, sore throat, dysuria, urinary incontinence, back pain, or V/D. She reports that she is trying to cut back on caffeine due to her bladder pain. She reports that she has been experiencing a lot of life stresses that are causing her stress. She states that she is having trouble sleeping, despite all the sleeping medications she takes.    Review of Systems  Constitutional: Negative for chills, fatigue and fever.  HENT: Negative for congestion, rhinorrhea, sinus pressure, sinus pain and sore throat.   Eyes: Negative for pain.  Respiratory: Negative for cough and shortness of breath.   Cardiovascular: Negative.  Negative for chest pain, palpitations and leg swelling.  Gastrointestinal: Positive for nausea (secondary to migraines). Negative for abdominal pain, blood in stool, diarrhea and vomiting.  Genitourinary: Negative for decreased urine volume, flank pain, frequency, vaginal bleeding and vaginal discharge.  Musculoskeletal: Negative for back pain.  Neurological: Positive for headaches (worsened migraines).  Psychiatric/Behavioral: Positive for sleep disturbance (secondary to life stresses).    History Past Medical History:  Diagnosis Date  . Allergy   . Anxiety   . Aortic stenosis   . Atrial fibrillation ([place])   . Back pain   . Bilateral swelling of feet   . Bursitis   . Cervical cancer screening 09/07/2015   Menarche at 11 Irregular and heavy and painful flow secondary to fibroids No history of abnormal pap in  past, last pap roughly 2003 G2P2, s/p 2 SVD history of abnormal MGM, 1 abnl bx right breast benign, normal otherwise Noconcerns today TAH b/l SPO for fibroids and migrainesand breast bx on right gyn surgeries  . Constipation   . Cough 08/13/2015  . Depression   . Depression with anxiety 06/29/2016  . Fainting    once to due heart out of rythm  . Fibromyalgia   . Frequent headaches   . GERD (gastroesophageal reflux disease)   . 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 Heartburn   . History of blood clots   . History of chicken pox   . Hx of blood clots    dvt  . Hyperlipidemia   . Hyperplastic colon polyp   . Hypertension   . Hypothyroid   . Insomnia 08/13/2015  . Internal hemorrhoids   . Interstitial cystitis   . Joint pain   . Lesion of lung    xray in 2014 thought to be benign seen at [facility] pulmonology  . Measles    h/o  . Migraine   . Obesity   . Osteoarthritis   . Preventative health care 02/03/2017  . Raynaud's disease   . S/P AVR (aortic valve replacement) 03/19/2015  . Shoulder pain 06/03/2017  . UTI (lower urinary tract infection)   . UTI (urinary tract infection) 02/11/2016    She has a past surgical history that includes Tonsillectomy; Tubal ligation; Dilation and curettage of uterus; Shoulder surgery (Left); Atrial ablation surgery; loop heart; Abdominal hysterectomy; Breast biopsy (Right); Aortic valve replacement (2014); Bladder suspension; Coronary  artery bypass graft; Esophagogastroduodenoscopy (egd) with propofol (N/A, 12/28/2020); and Colonoscopy with propofol (N/A, 12/28/2020).   Her family history includes Alcohol abuse in her father, paternal grandfather, and son; Alzheimer's disease in her father; Arthritis in her mother, paternal uncle, and son; Asthma in her maternal grandmother; Atrial fibrillation in her paternal grandfather; Cancer in her brother and father; Colon cancer in an other family member; Colon polyps in her father; Congestive Heart Failure in her  maternal grandmother; Diabetes in her maternal grandfather; Heart disease in her brother, maternal grandfather, maternal grandmother, mother, and paternal grandfather; Hyperlipidemia in her mother; Hypertension in her mother; Interstitial cystitis in her daughter; Mental illness in her son; Neuropathy in her mother; Obesity in her mother; Other in her brother, mother, and son; Stroke in her maternal grandfather and paternal grandfather; Thyroid disease in her mother.She reports that she has never smoked. She has never used smokeless tobacco. She reports that she does not drink alcohol and does not use drugs.  Current Outpatient Medications on File Prior to Visit  Medication Sig Dispense Refill  . amitriptyline (ELAVIL) 25 MG tablet Take 1 tablet (25 mg total) by mouth at bedtime.    Marland Kitchen atorvastatin (LIPITOR) 40 MG tablet Take 1 tablet (40 mg total) by mouth at bedtime. 90 tablet 1  . busPIRone (BUSPAR) 10 MG tablet Take 1 tablet (10 mg total) by mouth 2 (two) times daily. 180 tablet 1  . cyclobenzaprine (FLEXERIL) 5 MG tablet Take 5 mg by mouth 3 (three) times daily as needed for muscle spasms.    . DULoxetine (CYMBALTA) 60 MG capsule Take 2 capsules (120 mg total) by mouth daily. 180 capsule 1  . esomeprazole (NEXIUM) 40 MG capsule Take 40 mg by mouth 2 (two) times daily before a meal.     . famotidine (PEPCID) 40 MG tablet Take 1 tablet (40 mg total) by mouth at bedtime as needed for heartburn or indigestion. 30 tablet 3  . HYDROcodone-acetaminophen (NORCO) 7.5-325 MG tablet Take 1 tablet by mouth every 6 (six) hours as needed for moderate pain. 60 tablet 0  . hydrOXYzine (ATARAX/VISTARIL) 25 MG tablet Take 1 tablet (25 mg total) by mouth at bedtime as needed. 90 tablet 1  . levothyroxine (SYNTHROID) 125 MCG tablet TAKE ONE TABLET BY MOUTH ONCE WEEKLY BEFORE BREAKFAST ON MONDAY and TAKE ONE TABLET BY MOUTH ONCE WEEKLY BEFOR BREAKFAST ON  TUESDAYS and TAKE ONE TABLET BY MOUTH ONCE WEEKLY BEFORE  BREAKFAST ON WEDNESDAYS and TAKE ONE TABLET BY MOUTH ONCE WEEKLY BEFORE BREAKFAST ON THURSDAYS and TAKE ONE TABLET BY MOUTH ONCE WEEKLY BEFORE BREAKFAST ON FRIDAYS and TAKE ONE TABLET BY MOUTH satur(acb) 78 tablet 1  . losartan (COZAAR) 100 MG tablet Take 1 tablet (100 mg total) by mouth every morning. 90 tablet 1  . Magnesium 400 MG CAPS Take 400 mg by mouth daily. Reported on 02/13/2016    . meloxicam (MOBIC) 15 MG tablet Take 1 tablet (15 mg total) by mouth daily as needed for pain. 90 tablet 1  . Methen-Hyosc-Meth Blue-Na Phos (UROGESIC-BLUE) 81.6 MG TABS Take 1 tablet (81.6 mg total) by mouth 4 (four) times daily as needed.    . metoprolol succinate (TOPROL-XL) 100 MG 24 hr tablet Take 1 tablet (100 mg total) by mouth every morning. 90 tablet 1  . metoprolol succinate (TOPROL-XL) 50 MG 24 hr tablet Take 1 tablet (50 mg total) by mouth every morning. 90 tablet 1  . Misc Natural Products (FIBER 7 PO) Take by mouth.    Marland Kitchen  Na Sulfate-K Sulfate-Mg Sulf 17.5-3.13-1.6 GM/177ML SOLN At 5 PM the day before procedure take 1 bottle and 5 hours before procedure take 1 bottle. 354 mL 0  . ondansetron (ZOFRAN) 4 MG tablet Take 4 mg by mouth every 8 (eight) hours as needed for nausea or vomiting.    . rizatriptan (MAXALT) 10 MG tablet Take 10 mg by mouth as needed for migraine. May repeat in 2 hours if needed     No current facility-administered medications on file prior to visit.     Objective:  Objective  Physical Exam Constitutional:      General: She is not in acute distress.    Appearance: Normal appearance. She is not ill-appearing or toxic-appearing.  HENT:     Head: Normocephalic and atraumatic.     Right Ear: Tympanic membrane, ear canal and external ear normal.     Left Ear: Tympanic membrane, ear canal and external ear normal.     Nose: No congestion or rhinorrhea.  Eyes:     Extraocular Movements: Extraocular movements intact.     Pupils: Pupils are equal, round, and reactive to light.   Cardiovascular:     Rate and Rhythm: Normal rate and regular rhythm.     Pulses: Normal pulses.     Heart sounds: Normal heart sounds. No murmur heard.   Pulmonary:     Effort: Pulmonary effort is normal. No respiratory distress.     Breath sounds: Normal breath sounds. No wheezing, rhonchi or rales.  Abdominal:     General: Bowel sounds are normal.     Palpations: Abdomen is soft. There is no mass.     Tenderness: There is no abdominal tenderness. There is no guarding.     Hernia: No hernia is present.  Musculoskeletal:        General: Normal range of motion.     Cervical back: Normal range of motion and neck supple.  Skin:    General: Skin is warm and dry.  Neurological:     Mental Status: She is alert and oriented to person, place, and time.  Psychiatric:        Behavior: Behavior normal.    BP 132/90   Pulse 100   Temp 98.3 F (36.8 C)   Resp 18   Wt 269 lb (122 kg)   SpO2 97%   BMI 40.90 kg/m  Wt Readings from Last 3 Encounters:  03/01/21 269 lb (122 kg)  12/28/20 260 lb (117.9 kg)  12/19/20 263 lb (119.3 kg)     Lab Results  Component Value Date   WBC 6.9 12/14/2020   HGB 14.3 12/14/2020   HCT 42.3 12/14/2020   PLT 185.0 12/14/2020   GLUCOSE 93 12/14/2020   CHOL 182 12/14/2020   TRIG 132.0 12/14/2020   HDL 72.30 12/14/2020   LDLDIRECT 103.0 06/03/2017   LDLCALC 84 12/14/2020   ALT 14 12/14/2020   AST 14 12/14/2020   NA 138 12/14/2020   K 4.6 12/14/2020   CL 103 12/14/2020   CREATININE 1.10 12/14/2020   BUN 22 12/14/2020   CO2 25 12/14/2020   TSH 0.23 (L) 12/14/2020   HGBA1C 5.2 07/27/2020    No results found.   Assessment & Plan:  Plan    Meds ordered this encounter  Medications  . DISCONTD: Semaglutide-Weight Management (WEGOVY) 0.25 MG/0.5ML SOAJ    Sig: Inject 0.25 mg into the skin once a week.    Dispense:  2 mL    Refill:  0  .  DISCONTD: diazepam (VALIUM) 2 MG tablet    Sig: Take 0.5-2 tablets (1-4 mg total) by mouth 2 (two)  times daily as needed for anxiety.    Dispense:  30 tablet    Refill:  0    Problem List Items Addressed This Visit    Hypertension    Encouraged heart healthy diet, increase exercise, avoid trans fats, consider a krill oil cap daily      Migraine    Has a mild migraine today but in general can manage them with her current meds, no change in therapy. Encouraged increased hydration, 64 ounces of clear fluids daily. Minimize alcohol and caffeine. Eat small frequent meals with lean proteins and complex carbs. Avoid high and low blood sugars. Get adequate sleep, 7-8 hours a night. Needs exercise daily preferably in the morning.      Class 2 severe obesity with serious comorbidity and body mass index (BMI) of 36.0 to 36.9 in adult, unspecified obesity type ([place])    Encouraged DASH or MIND diet, decrease po intake and increase exercise as tolerated. Needs 7-8 hours of sleep nightly. Avoid trans fats, eat small, frequent meals every 4-5 hours with lean proteins, complex carbs and healthy fats. Minimize simple carbs, started on Wegovy at 0.25 mg weekly x 4 week and if her insurance covers it and she tolerates it we will titrate up monthly to 2.4.      UTI (urinary tract infection)    POCT urinalysis not suggestive of UTI but some ketones and bili noted. Repeat an urinalysis with a culture in 1 month and increase hydration      Depression with anxiety    Patient very stressed with sick family members. Has to take care of her daughter next month after surgery. Will maintain Duloxetine and buspar at current dosing but allow Valium 2 mg tabs to use 1-4 mg prn for anxiety attacks.      Vitamin D deficiency    Supplement and monitor       Other Visit Diagnoses    Frequent urination    -  Primary   Relevant Orders   POCT Urinalysis Dipstick (Automated) (Completed)      Follow-up: Return in about 3 months (around 06/01/2021).   I,David Hanna,acting as a scribe for Penni Homans, MD.,have  documented all relevant documentation on the behalf of Penni Homans, MD,as directed by  Penni Homans, MD while in the presence of Penni Homans, MD.  I, [name], MD personally performed the services described in this documentation. All medical record entries made by the scribe were at my direction and in my presence. I have reviewed the chart and agree that the record reflects my personal performance and is accurate and complete

## 2021-03-01 NOTE — Patient Instructions (Signed)
L Tryptophan caps for sleep. Melatonin up to 10 mg with it if needed [email or web address]">  DASH Eating Plan DASH stands for Dietary Approaches to Stop Hypertension. The DASH eating plan is a healthy eating plan that has been shown to:  Reduce high blood pressure (hypertension).  Reduce your risk for type 2 diabetes, heart disease, and stroke.  Help with weight loss. What are tips for following this plan? Reading food labels  Check food labels for the amount of salt (sodium) per serving. Choose foods with less than 5 percent of the Daily Value of sodium. Generally, foods with less than 300 milligrams (mg) of sodium per serving fit into this eating plan.  To find whole grains, look for the word "whole" as the first word in the ingredient list. Shopping  Buy products labeled as "low-sodium" or "no salt added."  Buy fresh foods. Avoid canned foods and pre-made or frozen meals. Cooking  Avoid adding salt when cooking. Use salt-free seasonings or herbs instead of table salt or sea salt. Check with your health care provider or pharmacist before using salt substitutes.  Do not fry foods. Cook foods using healthy methods such as baking, boiling, grilling, roasting, and broiling instead.  Cook with heart-healthy oils, such as olive, canola, avocado, soybean, or sunflower oil. Meal planning  Eat a balanced diet that includes: ? 4 or more servings of fruits and 4 or more servings of vegetables each day. Try to fill one-half of your plate with fruits and vegetables. ? 6-8 servings of whole grains each day. ? Less than 6 oz (170 g) of lean meat, poultry, or fish each day. A 3-oz (85-g) serving of meat is about the same size as a deck of cards. One egg equals 1 oz (28 g). ? 2-3 servings of low-fat dairy each day. One serving is 1 cup (237 mL). ? 1 serving of nuts, seeds, or beans 5 times each week. ? 2-3 servings of heart-healthy fats. Healthy fats  called omega-3 fatty acids are found in foods such as walnuts, flaxseeds, fortified milks, and eggs. These fats are also found in cold-water fish, such as sardines, salmon, and mackerel.  Limit how much you eat of: ? Canned or prepackaged foods. ? Food that is high in trans fat, such as some fried foods. ? Food that is high in saturated fat, such as fatty meat. ? Desserts and other sweets, sugary drinks, and other foods with added sugar. ? Full-fat dairy products.  Do not salt foods before eating.  Do not eat more than 4 egg yolks a week.  Try to eat at least 2 vegetarian meals a week.  Eat more home-cooked food and less restaurant, buffet, and fast food.   Lifestyle  When eating at a restaurant, ask that your food be prepared with less salt or no salt, if possible.  If you drink alcohol: ? Limit how much you use to:  0-1 drink a day for women who are not pregnant.  0-2 drinks a day for men. ? Be aware of how much alcohol is in your drink. In the [place] one drink equals one 12 oz bottle of beer (355 mL), one 5 oz glass of wine (148 mL), or one 1 oz glass of hard liquor (44 mL). General information  Avoid eating more than 2,300 mg of salt a day. If you have hypertension, you may need to reduce your sodium intake to 1,500 mg a day.  Work with your health care provider to maintain a  healthy body weight or to lose weight. Ask what an ideal weight is for you.  Get at least 30 minutes of exercise that causes your heart to beat faster (aerobic exercise) most days of the week. Activities may include walking, swimming, or biking.  Work with your health care provider or dietitian to adjust your eating plan to your individual calorie needs. What foods should I eat? Fruits All fresh, dried, or frozen fruit. Canned fruit in natural juice (without added sugar). Vegetables Fresh or frozen vegetables (raw, steamed, roasted, or grilled). Low-sodium or reduced-sodium tomato and vegetable juice.  Low-sodium or reduced-sodium tomato sauce and tomato paste. Low-sodium or reduced-sodium canned vegetables. Grains Whole-grain or whole-wheat bread. Whole-grain or whole-wheat pasta. Brown rice. Alexandra Patterson. Bulgur. Whole-grain and low-sodium cereals. Pita bread. Low-fat, low-sodium crackers. Whole-wheat flour tortillas. Meats and other proteins Skinless chicken or [place]. Ground chicken or [place]. Pork with fat trimmed off. Fish and seafood. Egg whites. Dried beans, peas, or lentils. Unsalted nuts, nut butters, and seeds. Unsalted canned beans. Lean cuts of beef with fat trimmed off. Low-sodium, lean precooked or cured meat, such as sausages or meat loaves. Dairy Low-fat (1%) or fat-free (skim) milk. Reduced-fat, low-fat, or fat-free cheeses. Nonfat, low-sodium ricotta or cottage cheese. Low-fat or nonfat yogurt. Low-fat, low-sodium cheese. Fats and oils Soft margarine without trans fats. Vegetable oil. Reduced-fat, low-fat, or light mayonnaise and salad dressings (reduced-sodium). Canola, safflower, olive, avocado, soybean, and sunflower oils. Avocado. Seasonings and condiments Herbs. Spices. Seasoning mixes without salt. Other foods Unsalted popcorn and pretzels. Fat-free sweets. The items listed above may not be a complete list of foods and beverages you can eat. Contact a dietitian for more information. What foods should I avoid? Fruits Canned fruit in a light or heavy syrup. Fried fruit. Fruit in cream or butter sauce. Vegetables Creamed or fried vegetables. Vegetables in a cheese sauce. Regular canned vegetables (not low-sodium or reduced-sodium). Regular canned tomato sauce and paste (not low-sodium or reduced-sodium). Regular tomato and vegetable juice (not low-sodium or reduced-sodium). Alexandra Patterson. Olives. Grains Baked goods made with fat, such as croissants, muffins, or some breads. Dry pasta or rice meal packs. Meats and other proteins Fatty cuts of meat. Ribs. Fried meat. Alexandra Patterson.  Bologna, salami, and other precooked or cured meats, such as sausages or meat loaves. Fat from the back of a pig (fatback). Bratwurst. Salted nuts and seeds. Canned beans with added salt. Canned or smoked fish. Whole eggs or egg yolks. Chicken or [place] with skin. Dairy Whole or 2% milk, cream, and half-and-half. Whole or full-fat cream cheese. Whole-fat or sweetened yogurt. Full-fat cheese. Nondairy creamers. Whipped toppings. Processed cheese and cheese spreads. Fats and oils Butter. Stick margarine. Lard. Shortening. Ghee. Bacon fat. Tropical oils, such as coconut, palm kernel, or palm oil. Seasonings and condiments Onion salt, garlic salt, seasoned salt, table salt, and sea salt. Worcestershire sauce. Tartar sauce. Barbecue sauce. Teriyaki sauce. Soy sauce, including reduced-sodium. Steak sauce. Canned and packaged gravies. Fish sauce. Oyster sauce. Cocktail sauce. Store-bought horseradish. Ketchup. Mustard. Meat flavorings and tenderizers. Bouillon cubes. Hot sauces. Pre-made or packaged marinades. Pre-made or packaged taco seasonings. Relishes. Regular salad dressings. Other foods Salted popcorn and pretzels. The items listed above may not be a complete list of foods and beverages you should avoid. Contact a dietitian for more information. Where to find more information  National Heart, Lung, and Blood Institute: https://wilson-eaton.com/  American Heart Association: www.heart.org  Academy of Nutrition and Dietetics: www.eatright.[place]: www.kidney.org Summary  The DASH eating plan is a healthy eating plan that has been shown to reduce high blood pressure (hypertension). It may also reduce your risk for type 2 diabetes, heart disease, and stroke.  When on the DASH eating plan, aim to eat more fresh fruits and vegetables, whole grains, lean proteins, low-fat dairy, and heart-healthy fats.  With the DASH eating plan, you should limit salt (sodium) intake to 2,300 mg a  day. If you have hypertension, you may need to reduce your sodium intake to 1,500 mg a day.  Work with your health care provider or dietitian to adjust your eating plan to your individual calorie needs. This information is not intended to replace advice given to you by your health care provider. Make sure you discuss any questions you have with your health care provider. Document Revised: 08/20/2019 Document Reviewed: 08/20/2019 Elsevier Patient Education  2021 [employer].

## 2021-03-02 NOTE — Assessment & Plan Note (Signed)
Has a mild migraine today but in general can manage them with her current meds, no change in therapy. Encouraged increased hydration, 64 ounces of clear fluids daily. Minimize alcohol and caffeine. Eat small frequent meals with lean proteins and complex carbs. Avoid high and low blood sugars. Get adequate sleep, 7-8 hours a night. Needs exercise daily preferably in the morning.

## 2021-03-02 NOTE — Telephone Encounter (Signed)
Not covered by insurance.  Would you like to change to something else?  Or have patient call insurance?

## 2021-03-02 NOTE — Telephone Encounter (Signed)
They did not give an alternative.  Pt advise on her mychart that they did not have it in stock.  She was going to call around to see who has it.

## 2021-03-02 NOTE — Assessment & Plan Note (Signed)
POCT urinalysis not suggestive of UTI but some ketones and bili noted. Repeat an urinalysis with a culture in 1 month and increase hydration

## 2021-03-02 NOTE — Assessment & Plan Note (Signed)
>>  ASSESSMENT AND PLAN FOR CLASS 2 SEVERE OBESITY WITH SERIOUS COMORBIDITY AND BODY MASS INDEX (BMI) OF 36.0 TO 36.9 IN ADULT, UNSPECIFIED OBESITY TYPE ([facility]) WRITTEN ON 03/02/2021 11:01 AM BY BLYTH, STACEY A, MD  Encouraged DASH or MIND diet, decrease po intake and increase exercise as tolerated. Needs 7-8 hours of sleep nightly. Avoid trans fats, eat small, frequent meals every 4-5 hours with lean proteins, complex carbs and healthy fats. Minimize simple carbs, started on Wegovy  at 0.25 mg weekly x 4 week and if her insurance covers it and she tolerates it we will titrate up monthly to 2.4.

## 2021-03-02 NOTE — Assessment & Plan Note (Signed)
Supplement and monitor 

## 2021-03-02 NOTE — Assessment & Plan Note (Signed)
Patient very stressed with sick family members. Has to take care of her daughter next month after surgery. Will maintain Duloxetine and buspar at current dosing but allow Valium 2 mg tabs to use 1-4 mg prn for anxiety attacks.

## 2021-03-02 NOTE — Assessment & Plan Note (Signed)
Encouraged DASH or MIND diet, decrease po intake and increase exercise as tolerated. Needs 7-8 hours of sleep nightly. Avoid trans fats, eat small, frequent meals every 4-5 hours with lean proteins, complex carbs and healthy fats. Minimize simple carbs, started on Wegovy at 0.25 mg weekly x 4 week and if her insurance covers it and she tolerates it we will titrate up monthly to 2.4.

## 2021-03-02 NOTE — Assessment & Plan Note (Signed)
Encouraged heart healthy diet, increase exercise, avoid trans fats, consider a krill oil cap daily 

## 2021-03-05 NOTE — Telephone Encounter (Signed)
That's is what it looks like from her mychart message.  Will double check with patient.

## 2021-03-05 NOTE — Telephone Encounter (Signed)
Please disregard the last couple of notes.  For this one they did not give any alternatives.  Do you want to try Saxenda?  I apologize.

## 2021-03-05 NOTE — Telephone Encounter (Signed)
Left message on machine to call back  

## 2021-03-06 ENCOUNTER — Telehealth: Payer: Self-pay

## 2021-03-06 NOTE — Telephone Encounter (Signed)
Referral form with progress notes, demographics and insurance card faxed to [facility] on 02/28/2021

## 2021-03-06 NOTE — Telephone Encounter (Signed)
Spoke with patient and she will call insurance

## 2021-03-06 NOTE — Addendum Note (Signed)
Addended by: Lurlean Nanny on: 03/06/2021 11:57 AM   Modules accepted: Orders

## 2021-03-26 ENCOUNTER — Telehealth: Payer: PPO | Admitting: Family Medicine

## 2021-03-29 DIAGNOSIS — M545 Low back pain, unspecified: Secondary | ICD-10-CM | POA: Diagnosis not present

## 2021-03-29 DIAGNOSIS — M79606 Pain in leg, unspecified: Secondary | ICD-10-CM | POA: Diagnosis not present

## 2021-03-29 DIAGNOSIS — G43101 Migraine with aura, not intractable, with status migrainosus: Secondary | ICD-10-CM | POA: Diagnosis not present

## 2021-04-08 ENCOUNTER — Other Ambulatory Visit: Payer: Self-pay | Admitting: Family Medicine

## 2021-04-09 MED ORDER — HYDROCODONE-ACETAMINOPHEN 7.5-325 MG PO TABS: 1.0000 | ORAL_TABLET | Freq: Four times a day (QID) | ORAL | 0 refills | Status: DC | PRN

## 2021-04-09 NOTE — Telephone Encounter (Signed)
Patient is requesting a refill of the following medications: Requested Prescriptions   Pending Prescriptions Disp Refills   HYDROcodone-acetaminophen (NORCO) 7.5-325 MG tablet 60 tablet 0    Sig: Take 1 tablet by mouth every 6 (six) hours as needed for moderate pain.    Date of patient request: 04/09/21 Last office visit: 12/30/20 Date of last refill: 03/01/21 Last refill amount: 60 + 0 Follow up time period per chart: 06/07/21

## 2021-04-13 DIAGNOSIS — K449 Diaphragmatic hernia without obstruction or gangrene: Secondary | ICD-10-CM | POA: Diagnosis not present

## 2021-04-13 DIAGNOSIS — K219 Gastro-esophageal reflux disease without esophagitis: Secondary | ICD-10-CM | POA: Diagnosis not present

## 2021-04-13 DIAGNOSIS — R7303 Prediabetes: Secondary | ICD-10-CM | POA: Diagnosis not present

## 2021-04-14 ENCOUNTER — Other Ambulatory Visit: Payer: Self-pay | Admitting: Family Medicine

## 2021-04-16 DIAGNOSIS — R338 Other retention of urine: Secondary | ICD-10-CM | POA: Diagnosis not present

## 2021-04-16 DIAGNOSIS — N3010 Interstitial cystitis (chronic) without hematuria: Secondary | ICD-10-CM | POA: Diagnosis not present

## 2021-04-24 ENCOUNTER — Other Ambulatory Visit: Payer: Self-pay

## 2021-04-24 ENCOUNTER — Ambulatory Visit (INDEPENDENT_AMBULATORY_CARE_PROVIDER_SITE_OTHER): Payer: PPO

## 2021-04-24 VITALS — BP 140/88 | HR 91 | Temp 97.6°F | Resp 16 | Ht 67.0 in | Wt 266.4 lb

## 2021-04-24 DIAGNOSIS — Z0000 Encounter for general adult medical examination without abnormal findings: Secondary | ICD-10-CM | POA: Diagnosis not present

## 2021-04-24 NOTE — Patient Instructions (Signed)
Alexandra Patterson , Thank you for taking time to come for your Medicare Wellness Visit. I appreciate your ongoing commitment to your health goals. Please review the following plan we discussed and let me know if I can assist you in the future.   Screening recommendations/referrals: Colonoscopy: Completed 12/28/2020-Due 12/19/2023 Mammogram: Completed 09/12/2020-Due 09/12/2021 Bone Density: Due-Postponed. Per our conversation, you will completed with mammogram in December. Recommended yearly ophthalmology/optometry visit for glaucoma screening and checkup Recommended yearly dental visit for hygiene and checkup  Vaccinations: Influenza vaccine: Up to date Pneumococcal vaccine: Up to date Tdap vaccine: Up to date-Due 05/25/2025 Shingles vaccine: Completed vaccines   Covid-19:Up to date  Advanced directives: Declined information today  Conditions/risks identified: See problem list  Next appointment: Follow up in one year for your annual wellness visit 04/30/2022 @ 1:00   Preventive Care 35 Years and Older, Female Preventive care refers to lifestyle choices and visits with your health care provider that can promote health and wellness. What does preventive care include? A yearly physical exam. This is also called an annual well check. Dental exams once or twice a year. Routine eye exams. Ask your health care provider how often you should have your eyes checked. Personal lifestyle choices, including: Daily care of your teeth and gums. Regular physical activity. Eating a healthy diet. Avoiding tobacco and drug use. Limiting alcohol use. Practicing safe sex. Taking low-dose aspirin every day. Taking vitamin and mineral supplements as recommended by your health care provider. What happens during an annual well check? The services and screenings done by your health care provider during your annual well check will depend on your age, overall health, lifestyle risk factors, and family history of  disease. Counseling  Your health care provider may ask you questions about your: Alcohol use. Tobacco use. Drug use. Emotional well-being. Home and relationship well-being. Sexual activity. Eating habits. History of falls. Memory and ability to understand (cognition). Work and work Statistician. Reproductive health. Screening  You may have the following tests or measurements: Height, weight, and BMI. Blood pressure. Lipid and cholesterol levels. These may be checked every 5 years, or more frequently if you are over 31 years old. Skin check. Lung cancer screening. You may have this screening every year starting at age 63 if you have a 30-pack-year history of smoking and currently smoke or have quit within the past 15 years. Fecal occult blood test (FOBT) of the stool. You may have this test every year starting at age 54. Flexible sigmoidoscopy or colonoscopy. You may have a sigmoidoscopy every 5 years or a colonoscopy every 10 years starting at age 65. Hepatitis C blood test. Hepatitis B blood test. Sexually transmitted disease (STD) testing. Diabetes screening. This is done by checking your blood sugar (glucose) after you have not eaten for a while (fasting). You may have this done every 1-3 years. Bone density scan. This is done to screen for osteoporosis. You may have this done starting at age 81. Mammogram. This may be done every 1-2 years. Talk to your health care provider about how often you should have regular mammograms. Talk with your health care provider about your test results, treatment options, and if necessary, the need for more tests. Vaccines  Your health care provider may recommend certain vaccines, such as: Influenza vaccine. This is recommended every year. Tetanus, diphtheria, and acellular pertussis (Tdap, Td) vaccine. You may need a Td booster every 10 years. Zoster vaccine. You may need this after age 59. Pneumococcal 13-valent conjugate (PCV13) vaccine.  One  dose is recommended after age 6. Pneumococcal polysaccharide (PPSV23) vaccine. One dose is recommended after age 77. Talk to your health care provider about which screenings and vaccines you need and how often you need them. This information is not intended to replace advice given to you by your health care provider. Make sure you discuss any questions you have with your health care provider. Document Released: 10/13/2015 Document Revised: 06/05/2016 Document Reviewed: 07/18/2015 Elsevier Interactive Patient Education  2017 [place] Prevention in the Home Falls can cause injuries. They can happen to people of all ages. There are many things you can do to make your home safe and to help prevent falls. What can I do on the outside of my home? Regularly fix the edges of walkways and driveways and fix any cracks. Remove anything that might make you trip as you walk through a door, such as a raised step or threshold. Trim any bushes or trees on the path to your home. Use bright outdoor lighting. Clear any walking paths of anything that might make someone trip, such as rocks or tools. Regularly check to see if handrails are loose or broken. Make sure that both sides of any steps have handrails. Any raised decks and porches should have guardrails on the edges. Have any leaves, snow, or ice cleared regularly. Use sand or salt on walking paths during winter. Clean up any spills in your garage right away. This includes oil or grease spills. What can I do in the bathroom? Use night lights. Install grab bars by the toilet and in the tub and shower. Do not use towel bars as grab bars. Use non-skid mats or decals in the tub or shower. If you need to sit down in the shower, use a plastic, non-slip stool. Keep the floor dry. Clean up any water that spills on the floor as soon as it happens. Remove soap buildup in the tub or shower regularly. Attach bath mats securely with double-sided  non-slip rug tape. Do not have throw rugs and other things on the floor that can make you trip. What can I do in the bedroom? Use night lights. Make sure that you have a light by your bed that is easy to reach. Do not use any sheets or blankets that are too big for your bed. They should not hang down onto the floor. Have a firm chair that has side arms. You can use this for support while you get dressed. Do not have throw rugs and other things on the floor that can make you trip. What can I do in the kitchen? Clean up any spills right away. Avoid walking on wet floors. Keep items that you use a lot in easy-to-reach places. If you need to reach something above you, use a strong step stool that has a grab bar. Keep electrical cords out of the way. Do not use floor polish or wax that makes floors slippery. If you must use wax, use non-skid floor wax. Do not have throw rugs and other things on the floor that can make you trip. What can I do with my stairs? Do not leave any items on the stairs. Make sure that there are handrails on both sides of the stairs and use them. Fix handrails that are broken or loose. Make sure that handrails are as long as the stairways. Check any carpeting to make sure that it is firmly attached to the stairs. Fix any carpet that is loose or  worn. Avoid having throw rugs at the top or bottom of the stairs. If you do have throw rugs, attach them to the floor with carpet tape. Make sure that you have a light switch at the top of the stairs and the bottom of the stairs. If you do not have them, ask someone to add them for you. What else can I do to help prevent falls? Wear shoes that: Do not have high heels. Have rubber bottoms. Are comfortable and fit you well. Are closed at the toe. Do not wear sandals. If you use a stepladder: Make sure that it is fully opened. Do not climb a closed stepladder. Make sure that both sides of the stepladder are locked into place. Ask  someone to hold it for you, if possible. Clearly mark and make sure that you can see: Any grab bars or handrails. First and last steps. Where the edge of each step is. Use tools that help you move around (mobility aids) if they are needed. These include: Canes. Walkers. Scooters. Crutches. Turn on the lights when you go into a dark area. Replace any light bulbs as soon as they burn out. Set up your furniture so you have a clear path. Avoid moving your furniture around. If any of your floors are uneven, fix them. If there are any pets around you, be aware of where they are. Review your medicines with your doctor. Some medicines can make you feel dizzy. This can increase your chance of falling. Ask your doctor what other things that you can do to help prevent falls. This information is not intended to replace advice given to you by your health care provider. Make sure you discuss any questions you have with your health care provider. Document Released: 07/13/2009 Document Revised: 02/22/2016 Document Reviewed: 10/21/2014 Elsevier Interactive Patient Education  2017 [employer].

## 2021-04-24 NOTE — Progress Notes (Signed)
Subjective:   Alexandra Patterson is a 65 y.o. female who presents for Medicare Annual (Subsequent) preventive examination.  Review of Systems     Cardiac Risk Factors include: advanced age (>30mn, >>59women);hypertension;dyslipidemia;obesity (BMI >30kg/m2);sedentary lifestyle     Objective:    Today's Vitals   04/24/21 1045 04/24/21 1050  BP: 140/88   Pulse: 91   Resp: 16   Temp: 97.6 F (36.4 C)   TempSrc: Temporal   SpO2: 98%   Weight: 266 lb 6.4 oz (120.8 kg)   Height: '5\' 7"'$  (1.702 m)   PainSc:  3    Body mass index is 41.72 kg/m.  Advanced Directives 04/24/2021 12/28/2020 01/14/2019  Does Patient Have a Medical Advance Directive? No No No  Would patient like information on creating a medical advance directive? No - Patient declined No - Patient declined No - Patient declined    Current Medications (verified) Outpatient Encounter Medications as of 04/24/2021  Medication Sig   amitriptyline (ELAVIL) 25 MG tablet Take 1 tablet (25 mg total) by mouth at bedtime.   atorvastatin (LIPITOR) 40 MG tablet TAKE ONE TABLET BY MOUTH EVERYDAY AT BEDTIME   busPIRone (BUSPAR) 10 MG tablet Take 1 tablet (10 mg total) by mouth 2 (two) times daily.   cyclobenzaprine (FLEXERIL) 5 MG tablet Take 5 mg by mouth 3 (three) times daily as needed for muscle spasms.   DULoxetine (CYMBALTA) 60 MG capsule TAKE TWO CAPSULES BY MOUTH EVERY MORNING   esomeprazole (NEXIUM) 40 MG capsule Take 40 mg by mouth 2 (two) times daily before a meal.    HYDROcodone-acetaminophen (NORCO) 7.5-325 MG tablet Take 1 tablet by mouth every 6 (six) hours as needed for moderate pain.   hydrOXYzine (ATARAX/VISTARIL) 25 MG tablet Take 1 tablet (25 mg total) by mouth at bedtime as needed.   levothyroxine (SYNTHROID) 125 MCG tablet TAKE ONE TABLET BY MOUTH ONCE WEEKLY BEFORE BREAKFAST ON MONDAY and TAKE ONE TABLET BY MOUTH ONCE WEEKLY BEFOR BREAKFAST ON  TUESDAYS and TAKE ONE TABLET BY MOUTH ONCE WEEKLY BEFORE BREAKFAST ON  WEDNESDAYS and TAKE ONE TABLET BY MOUTH ONCE WEEKLY BEFORE BREAKFAST ON THURSDAYS and TAKE ONE TABLET BY MOUTH ONCE WEEKLY BEFORE BREAKFAST ON FRIDAYS and TAKE ONE TABLET BY MOUTH satur(acb)   losartan (COZAAR) 100 MG tablet TAKE ONE TABLET BY MOUTH EVERY MORNING   Magnesium 400 MG CAPS Take 400 mg by mouth daily. Reported on 02/13/2016   meloxicam (MOBIC) 15 MG tablet TAKE ONE TABLET BY MOUTH EVERY MORNING   Methen-Hyosc-Meth Blue-Na Phos (UROGESIC-BLUE) 81.6 MG TABS Take 1 tablet (81.6 mg total) by mouth 4 (four) times daily as needed.   metoprolol succinate (TOPROL-XL) 100 MG 24 hr tablet TAKE ONE TABLET BY MOUTH EVERY MORNING   metoprolol succinate (TOPROL-XL) 50 MG 24 hr tablet TAKE ONE TABLET BY MOUTH EVERY MORNING   Misc Natural Products (FIBER 7 PO) Take by mouth.   ondansetron (ZOFRAN) 4 MG tablet Take 4 mg by mouth every 8 (eight) hours as needed for nausea or vomiting.   rizatriptan (MAXALT) 10 MG tablet Take 10 mg by mouth as needed for migraine. May repeat in 2 hours if needed   Semaglutide,0.25 or 0.'5MG'$ /DOS, 2 MG/1.5ML SOPN Inject into the skin.   zonisamide (ZONEGRAN) 100 MG capsule Take 200 mg by mouth 2 (two) times daily.   [DISCONTINUED] famotidine (PEPCID) 40 MG tablet Take 1 tablet (40 mg total) by mouth at bedtime as needed for heartburn or indigestion.   [DISCONTINUED] Na  Sulfate-K Sulfate-Mg Sulf 17.5-3.13-1.6 GM/177ML SOLN At 5 PM the day before procedure take 1 bottle and 5 hours before procedure take 1 bottle.   No facility-administered encounter medications on file as of 04/24/2021.    Allergies (verified) Ciprofloxacin, Sulfa antibiotics, Sulfasalazine, Cortisone, Tramadol, Zonisamide, Erythromycin, Erythromycin base, Levofloxacin, Lisinopril, and Prednisone   History: Past Medical History:  Diagnosis Date   Allergy    Anxiety    Aortic stenosis    Atrial fibrillation (HCC)    Back pain    Bilateral swelling of feet    Bursitis    Cervical cancer screening  09/07/2015   Menarche at 11 Irregular and heavy and painful flow secondary to fibroids No history of abnormal pap in past, last pap roughly 2003 G2P2, s/p 2 SVD history of abnormal MGM, 1 abnl bx right breast benign, normal otherwise Noconcerns today TAH b/l SPO for fibroids and migrainesand breast bx on right gyn surgeries   Constipation    Cough 08/13/2015   Depression    Depression with anxiety 06/29/2016   Fainting    once to due heart out of rythm   Fibromyalgia    Frequent headaches    GERD (gastroesophageal reflux disease)    Hair loss disorder 06/03/2017   Heartburn    History of blood clots    History of chicken pox    Hx of blood clots    dvt   Hyperlipidemia    Hyperplastic colon polyp    Hypertension    Hypothyroid    Insomnia 08/13/2015   Internal hemorrhoids    Interstitial cystitis    Joint pain    Lesion of lung    xray in 2014 thought to be benign seen at [facility] pulmonology   Measles    h/o   Migraine    Obesity    Osteoarthritis    Preventative health care 02/03/2017   Raynaud's disease    S/P AVR (aortic valve replacement) 03/19/2015   Shoulder pain 06/03/2017   UTI (lower urinary tract infection)    UTI (urinary tract infection) 02/11/2016   Past Surgical History:  Procedure Laterality Date   ABDOMINAL HYSTERECTOMY     TAH SPO   AORTIC VALVE REPLACEMENT  2014   with [facility]   ATRIAL ABLATION SURGERY     BLADDER SUSPENSION     BREAST BIOPSY Right    Needle Biopsy   COLONOSCOPY WITH PROPOFOL N/A 12/28/2020   Procedure: COLONOSCOPY WITH PROPOFOL;  Surgeon: Virgel Manifold, MD;  Location: [facility] ENDOSCOPY;  Service: Endoscopy;  Laterality: N/A;   CORONARY ARTERY BYPASS GRAFT     DILATION AND CURETTAGE OF UTERUS     x 3   ESOPHAGOGASTRODUODENOSCOPY (EGD) WITH PROPOFOL N/A 12/28/2020   Procedure: ESOPHAGOGASTRODUODENOSCOPY (EGD) WITH PROPOFOL;  Surgeon: Virgel Manifold, MD;  Location: [facility] ENDOSCOPY;  Service: Endoscopy;  Laterality: N/A;   loop  heart     SHOULDER SURGERY Left    arthroscopy for spurs   TONSILLECTOMY     TUBAL LIGATION     Family History  Problem Relation Age of Onset   Colon polyps Father    Alzheimer's disease Father    Alcohol abuse Father    Cancer Father        colon cancer   Arthritis Mother        s/p TKR   Neuropathy Mother    Hyperlipidemia Mother    Other Mother        familial mediterranean fever   Thyroid  disease Mother    Hypertension Mother    Obesity Mother    Heart disease Mother    Cancer Brother        prostate cancer   Heart disease Brother        cardiomegaly   Other Brother        familial mediterranean fever   Asthma Maternal Grandmother    Congestive Heart Failure Maternal Grandmother    Heart disease Maternal Grandmother        chf   Stroke Maternal Grandfather    Diabetes Maternal Grandfather    Heart disease Maternal Grandfather        hardening of the arteries   Stroke Paternal Grandfather    Atrial fibrillation Paternal Grandfather    Alcohol abuse Paternal Grandfather    Heart disease Paternal Grandfather        afib   Interstitial cystitis Daughter    Arthritis Son    Alcohol abuse Son        in remission   Mental illness Son        depression   Other Son        interstitial cystitis   Colon cancer Other        parent   Arthritis Paternal Uncle    Social History   Socioeconomic History   Marital status: Married    Spouse name: Not on file   Number of children: 2   Years of education: Not on file   Highest education level: Not on file  Occupational History   Occupation: Best boy: [employer]  Tobacco Use   Smoking status: Never   Smokeless tobacco: Never  Substance and Sexual Activity   Alcohol use: No   Drug use: No   Sexual activity: Yes    Comment: lives with husband and adult son, no major dietary restrictions, full diability  Other Topics Concern   Not on file  Social History Narrative   Not on file    Social Determinants of Health   Financial Resource Strain: Low Risk    Difficulty of Paying Living Expenses: Not hard at all  Food Insecurity: No Food Insecurity   Worried About Charity fundraiser in the Last Year: Never true   [place] in the Last Year: Never true  Transportation Needs: No Transportation Needs   Lack of Transportation (Medical): No   Lack of Transportation (Non-Medical): No  Physical Activity: Inactive   Days of Exercise per Week: 0 days   Minutes of Exercise per Session: 0 min  Stress: No Stress Concern Present   Feeling of Stress : Only a little  Social Connections: Moderately Integrated   Frequency of Communication with Friends and Family: More than three times a week   Frequency of Social Gatherings with Friends and Family: Once a week   Attends Religious Services: More than 4 times per year   Active Member of [employer] or Organizations: No   Attends Music therapist: Never   Marital Status: Married    Tobacco Counseling Counseling given: Not Answered   Clinical Intake:  Pre-visit preparation completed: Yes  Pain : 0-10 Pain Score: 3  (Fibromyalgia) Pain Type: Chronic pain Pain Location: Generalized Pain Onset: More than a month ago Pain Frequency: Constant     Nutritional Risks: None Diabetes: No  How often do you need to have someone help you when you read instructions, pamphlets, or other written materials from  your doctor or pharmacy?: 1 - Never  Diabetic?No  Interpreter Needed?: No  Information entered by :: Caroleen Hamman LPN   Activities of Daily Living In your present state of health, do you have any difficulty performing the following activities: 04/24/2021 03/01/2021  Hearing? N N  Vision? N N  Difficulty concentrating or making decisions? N N  Walking or climbing stairs? N N  Dressing or bathing? N N  Doing errands, shopping? N N  Preparing Food and eating ? N -  Using the Toilet? N -  In the past six  months, have you accidently leaked urine? N -  Do you have problems with loss of bowel control? N -  Managing your Medications? N -  Managing your Finances? N -  Housekeeping or managing your Housekeeping? N -  Some recent data might be hidden    Patient Care Team: [name], MD as PCP - General (Family Medicine) Veneda Melter, MD as Referring Physician (Cardiology) Dudley Major, MD as Referring Physician (Internal Medicine) Lunette Stands Glori Bickers, MD as Referring Physician (Cardiothoracic Surgery) Kerin Perna., MD as Referring Physician (Neurology) Day, Melvenia Beam, [facility] (Inactive) as Pharmacist (Pharmacist)  Indicate any recent Medical Services you may have received from other than [facility] providers in the past year (date may be approximate).     Assessment:   This is a routine wellness examination for Alexandra Patterson.  Hearing/Vision screen Hearing Screening - Comments:: No issues Vision Screening - Comments:: Last eye exam--over 1 year-[facility]  Dietary issues and exercise activities discussed: Current Exercise Habits: The patient does not participate in regular exercise at present, Exercise limited by: orthopedic condition(s)   Goals Addressed             This Visit's Progress    DIET - INCREASE WATER INTAKE   Not on track    Exercise 150 min/wk Moderate Activity   Not on track      Depression Screen [facility] 2/9 Scores 04/24/2021 03/01/2021 07/13/2020 01/24/2020 01/14/2019 06/20/2016 01/18/2016  PHQ - 2 Score '1 2 4 4 2 4 '$ 0  PHQ- 9 Score - '11 12 9 7 11 '$ -  Exception Documentation - - - - - - Medical reason    Fall Risk Fall Risk  04/24/2021 03/01/2021 07/27/2020 01/14/2019 06/20/2016  Falls in the past year? 0 0 0 0 No  Number falls in past yr: 0 0 0 - -  Injury with Fall? 0 0 0 - -  Risk for fall due to : History of fall(s) - - - -  Follow up Falls prevention discussed - - - -    FALL RISK PREVENTION PERTAINING TO THE HOME:  Any stairs in or around the home? No  Home  free of loose throw rugs in walkways, pet beds, electrical cords, etc? Yes  Adequate lighting in your home to reduce risk of falls? Yes   ASSISTIVE DEVICES UTILIZED TO PREVENT FALLS:  Life alert? No  Use of a cane, walker or w/c? No  Grab bars in the bathroom? Yes  Shower chair or bench in shower? No  Elevated toilet seat or a handicapped toilet? No   TIMED UP AND GO:  Was the test performed? Yes .  Length of time to ambulate 10 feet: 10 sec.   Gait steady and fast without use of assistive device  Cognitive Function:Normal cognitive status assessed by direct observation by this Nurse Health Advisor. No abnormalities found.  Immunizations Immunization History  Administered Date(s) Administered   Fluad Quad(high Dose 65+) 07/27/2020   Influenza,inj,Quad PF,6+ Mos 07/29/2019   PFIZER(Purple Top)SARS-COV-2 Vaccination 12/23/2019, 01/17/2020, 11/27/2020   Pneumococcal Polysaccharide-23 07/27/2020   Tdap 05/26/2015   Zoster Recombinat (Shingrix) 05/29/2018, 08/01/2018   Zoster, Live 05/26/2015    TDAP status: Up to date  Flu Vaccine status: Up to date  Pneumococcal vaccine status: Up to date  Covid-19 vaccine status: Completed vaccines  Qualifies for Shingles Vaccine? No   Zostavax completed Yes   Shingrix Completed?: Yes  Screening Tests Health Maintenance  Topic Date Due   Hepatitis C Screening  Never done   COVID-19 Vaccine (4 - Booster for Pfizer series) 02/24/2021   INFLUENZA VACCINE  04/30/2021   PNA vac Low Risk Adult (2 of 2 - PCV13) 07/27/2021   MAMMOGRAM  09/12/2022   COLONOSCOPY (Pts 45-31yr Insurance coverage will need to be confirmed)  12/29/2023   TETANUS/TDAP  05/25/2025   DEXA SCAN  Completed   Zoster Vaccines- Shingrix  Completed   HPV VACCINES  Aged Out    Health Maintenance  Health Maintenance Due  Topic Date Due   Hepatitis C Screening  Never done   COVID-19 Vaccine (4 - Booster for PBarviewseries) 02/24/2021    Colorectal  cancer screening: Type of screening: Colonoscopy. Completed 12/28/2020. Repeat every 3 years  Mammogram status: Completed 09/12/2020. Repeat every year  Bone Density status: Due-Declined today.  Lung Cancer Screening: (Low Dose CT Chest recommended if Age 66-80years, 30 pack-year currently smoking OR have quit w/in 15years.) does not qualify.     Additional Screening:  Hepatitis C Screening: Patient states she had done at previous PCP  Vision Screening: Recommended annual ophthalmology exams for early detection of glaucoma and other disorders of the eye. Is the patient up to date with their annual eye exam?  No  Who is the provider or what is the name of the office in which the patient attends annual eye exams? [facility] Patient plans to make an appt  Dental Screening: Recommended annual dental exams for proper oral hygiene  Community Resource Referral / Chronic Care Management: CRR required this visit?  No   CCM required this visit?  No      Plan:     I have personally reviewed and noted the following in the patient's chart:   Medical and social history Use of alcohol, tobacco or illicit drugs  Current medications and supplements including opioid prescriptions.  Functional ability and status Nutritional status Physical activity Advanced directives List of other physicians Hospitalizations, surgeries, and ER visits in previous 12 months Vitals Screenings to include cognitive, depression, and falls Referrals and appointments  In addition, I have reviewed and discussed with patient certain preventive protocols, quality metrics, and best practice recommendations. A written personalized care plan for preventive services as well as general preventive health recommendations were provided to patient.   Patient would like to access avs on mychart  MMarta Antu [place]  [id number] Nurse Health Advisor  Nurse Notes: None

## 2021-05-22 ENCOUNTER — Other Ambulatory Visit: Payer: Self-pay | Admitting: Family Medicine

## 2021-05-23 ENCOUNTER — Encounter: Payer: Self-pay | Admitting: Family Medicine

## 2021-05-23 MED ORDER — HYDROCODONE-ACETAMINOPHEN 7.5-325 MG PO TABS: 1.0000 | ORAL_TABLET | Freq: Four times a day (QID) | ORAL | 0 refills | Status: DC | PRN

## 2021-05-23 NOTE — Telephone Encounter (Signed)
Requesting: hydrocodone 7.5-'325mg'$  Contract: 02/17/2020 UDS: 02/17/2020 Last Visit: 03/01/2021 Next Visit: 06/07/2021 Last Refill: 04/09/2021 #60 and 0RF  Please Advise

## 2021-05-23 NOTE — Telephone Encounter (Signed)
Requesting: hydrocodone 7.5-'325mg'$  Contract:02/17/2020 UDS: 02/17/2020 Last Visit:03/01/21 Next Visit: 06/07/21 Last Refill: 04/09/2021 #60 and 0RF Pt sig: 1 tab q6h prn  Please Advise

## 2021-06-07 ENCOUNTER — Encounter: Payer: Self-pay | Admitting: Family Medicine

## 2021-06-07 ENCOUNTER — Ambulatory Visit (INDEPENDENT_AMBULATORY_CARE_PROVIDER_SITE_OTHER): Payer: PPO | Admitting: Family Medicine

## 2021-06-07 ENCOUNTER — Other Ambulatory Visit: Payer: Self-pay

## 2021-06-07 VITALS — BP 116/68 | HR 68 | Temp 98.0°F | Resp 16 | Wt 269.4 lb

## 2021-06-07 DIAGNOSIS — R5383 Other fatigue: Secondary | ICD-10-CM

## 2021-06-07 DIAGNOSIS — I10 Essential (primary) hypertension: Secondary | ICD-10-CM

## 2021-06-07 DIAGNOSIS — E061 Subacute thyroiditis: Secondary | ICD-10-CM | POA: Diagnosis not present

## 2021-06-07 DIAGNOSIS — E559 Vitamin D deficiency, unspecified: Secondary | ICD-10-CM

## 2021-06-07 DIAGNOSIS — Z6836 Body mass index (BMI) 36.0-36.9, adult: Secondary | ICD-10-CM | POA: Diagnosis not present

## 2021-06-07 DIAGNOSIS — Z23 Encounter for immunization: Secondary | ICD-10-CM

## 2021-06-07 DIAGNOSIS — R0683 Snoring: Secondary | ICD-10-CM

## 2021-06-07 DIAGNOSIS — E785 Hyperlipidemia, unspecified: Secondary | ICD-10-CM

## 2021-06-07 DIAGNOSIS — G4700 Insomnia, unspecified: Secondary | ICD-10-CM | POA: Diagnosis not present

## 2021-06-07 DIAGNOSIS — R739 Hyperglycemia, unspecified: Secondary | ICD-10-CM

## 2021-06-07 DIAGNOSIS — G43809 Other migraine, not intractable, without status migrainosus: Secondary | ICD-10-CM

## 2021-06-07 NOTE — Progress Notes (Signed)
Patient ID: Alexandra Patterson, female    DOB: [birth year]  Age: 65 y.o. MRN: [id number]    Subjective:   No chief complaint on file.  Subjective   HPI Alexandra Patterson presents for office visit today for follow up on HTN and weight loss. She has no febrile illnesses or recent ER visits. She has trouble falling and getting to sleep. Her husband confirms that she does snore in her sleep. She experiences morning HA's, fatigue, nausea, and joint pain. She has done a sleep study before but it was a long time a go. Denies CP/palp/SOB/congestion/fevers/GI or GU c/o. Taking meds as prescribed.  She went to Dr. Greer Patterson because she was interested in getting surgery to fix it. However, he was worried about her current weight and recommended that she lose some weight before considering surgery. He administered GLP-1 injection but she was not tolerating it as she experienced nausea and worsening acid reflux so she did not continue. She was then recommended geriatric surgery to help address her weight issues. She endorses finishing all the pre-work needed for surgery approval but she is still concerned if it is her best option.   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   Review of Systems  Constitutional:  Positive for fatigue. Negative for chills and fever.  HENT:  Negative for congestion, rhinorrhea, sinus pressure, sinus pain, sore throat and trouble swallowing.   Eyes:  Negative for pain.  Respiratory:  Positive for apnea. Negative for cough and shortness of breath.   Cardiovascular:  Negative for chest pain, palpitations and leg swelling.  Gastrointestinal:  Negative for abdominal pain, blood in stool, diarrhea, nausea and vomiting.  Genitourinary:  Negative for decreased urine volume, flank pain, frequency, vaginal bleeding and vaginal discharge.  Musculoskeletal:  Positive for arthralgias and joint swelling. Negative for back pain.   Neurological:  Positive for headaches.  Psychiatric/Behavioral:  Positive for sleep disturbance.    History Past Medical History:  Diagnosis Date   Allergy    Anxiety    Aortic stenosis    Atrial fibrillation (HCC)    Back pain    Bilateral swelling of feet    Bursitis    Cervical cancer screening 09/07/2015   Menarche at 11 Irregular and heavy and painful flow secondary to fibroids No history of abnormal pap in past, last pap roughly 2003 G2P2, s/p 2 SVD history of abnormal MGM, 1 abnl bx right breast benign, normal otherwise Noconcerns today TAH b/l SPO for fibroids and migrainesand breast bx on right gyn surgeries   Constipation    Cough 08/13/2015   Depression    Depression with anxiety 06/29/2016   Fainting    once to due heart out of rythm   Fibromyalgia    Frequent headaches    GERD (gastroesophageal reflux disease)    Hair loss disorder 06/03/2017   Heartburn    History of blood clots    History of chicken pox    Hx of blood clots    dvt   Hyperlipidemia    Hyperplastic colon polyp    Hypertension    Hypothyroid    Insomnia 08/13/2015   Internal hemorrhoids    Interstitial cystitis    Joint pain    Lesion of lung    xray in 2014 thought to be benign seen at [facility] pulmonology   Measles    h/o   Migraine    Obesity  Osteoarthritis    Preventative health care 02/03/2017   Raynaud's disease    S/P AVR (aortic valve replacement) 03/19/2015   Shoulder pain 06/03/2017   UTI (lower urinary tract infection)    UTI (urinary tract infection) 02/11/2016    She has a past surgical history that includes Tonsillectomy; Tubal ligation; Dilation and curettage of uterus; Shoulder surgery (Left); Atrial ablation surgery; loop heart; Abdominal hysterectomy; Breast biopsy (Right); Aortic valve replacement (2014); Bladder suspension; Coronary artery bypass graft; Esophagogastroduodenoscopy (egd) with propofol (N/A, 12/28/2020); and Colonoscopy with propofol (N/A, 12/28/2020).    Her family history includes Alcohol abuse in her father, paternal grandfather, and son; Alzheimer's disease in her father; Arthritis in her mother, paternal uncle, and son; Asthma in her maternal grandmother; Atrial fibrillation in her paternal grandfather; Cancer in her brother and father; Colon cancer in an other family member; Colon polyps in her father; Congestive Heart Failure in her maternal grandmother; Diabetes in her maternal grandfather; Heart disease in her brother, maternal grandfather, maternal grandmother, mother, and paternal grandfather; Hyperlipidemia in her mother; Hypertension in her mother; Interstitial cystitis in her daughter; Mental illness in her son; Neuropathy in her mother; Obesity in her mother; Other in her brother, mother, and son; Stroke in her maternal grandfather and paternal grandfather; Thyroid disease in her mother.She reports that she has never smoked. She has never used smokeless tobacco. She reports that she does not drink alcohol and does not use drugs.  Current Outpatient Medications on File Prior to Visit  Medication Sig Dispense Refill   amitriptyline (ELAVIL) 25 MG tablet Take 1 tablet (25 mg total) by mouth at bedtime.     atorvastatin (LIPITOR) 40 MG tablet TAKE ONE TABLET BY MOUTH EVERYDAY AT BEDTIME 90 tablet 1   busPIRone (BUSPAR) 10 MG tablet Take 1 tablet (10 mg total) by mouth 2 (two) times daily. 180 tablet 1   cyclobenzaprine (FLEXERIL) 5 MG tablet Take 5 mg by mouth 3 (three) times daily as needed for muscle spasms.     DULoxetine (CYMBALTA) 60 MG capsule TAKE TWO CAPSULES BY MOUTH EVERY MORNING 180 capsule 1   esomeprazole (NEXIUM) 40 MG capsule Take 40 mg by mouth 2 (two) times daily before a meal.      HYDROcodone-acetaminophen (NORCO) 7.5-325 MG tablet Take 1 tablet by mouth every 6 (six) hours as needed for moderate pain. 60 tablet 0   hydrOXYzine (ATARAX/VISTARIL) 25 MG tablet Take 1 tablet (25 mg total) by mouth at bedtime as needed. 90  tablet 1   levothyroxine (SYNTHROID) 125 MCG tablet TAKE ONE TABLET BY MOUTH ONCE WEEKLY BEFORE BREAKFAST ON MONDAY and TAKE ONE TABLET BY MOUTH ONCE WEEKLY BEFOR BREAKFAST ON  TUESDAYS and TAKE ONE TABLET BY MOUTH ONCE WEEKLY BEFORE BREAKFAST ON WEDNESDAYS and TAKE ONE TABLET BY MOUTH ONCE WEEKLY BEFORE BREAKFAST ON THURSDAYS and TAKE ONE TABLET BY MOUTH ONCE WEEKLY BEFORE BREAKFAST ON FRIDAYS and TAKE ONE TABLET BY MOUTH satur(acb) 78 tablet 1   losartan (COZAAR) 100 MG tablet TAKE ONE TABLET BY MOUTH EVERY MORNING 90 tablet 1   Magnesium 400 MG CAPS Take 400 mg by mouth daily. Reported on 02/13/2016     meloxicam (MOBIC) 15 MG tablet TAKE ONE TABLET BY MOUTH EVERY MORNING 90 tablet 1   Methen-Hyosc-Meth Blue-Na Phos (UROGESIC-BLUE) 81.6 MG TABS Take 1 tablet (81.6 mg total) by mouth 4 (four) times daily as needed.     metoprolol succinate (TOPROL-XL) 100 MG 24 hr tablet TAKE ONE TABLET BY MOUTH EVERY  MORNING 90 tablet 1   metoprolol succinate (TOPROL-XL) 50 MG 24 hr tablet TAKE ONE TABLET BY MOUTH EVERY MORNING 90 tablet 1   Misc Natural Products (FIBER 7 PO) Take by mouth.     ondansetron (ZOFRAN) 4 MG tablet Take 4 mg by mouth every 8 (eight) hours as needed for nausea or vomiting.     rizatriptan (MAXALT) 10 MG tablet Take 10 mg by mouth as needed for migraine. May repeat in 2 hours if needed     zonisamide (ZONEGRAN) 100 MG capsule Take 200 mg by mouth 2 (two) times daily.     No current facility-administered medications on file prior to visit.     Objective:  Objective  Physical Exam Constitutional:      General: She is not in acute distress.    Appearance: Normal appearance. She is not ill-appearing or toxic-appearing.  HENT:     Head: Normocephalic and atraumatic.     Right Ear: Tympanic membrane, ear canal and external ear normal.     Left Ear: Tympanic membrane, ear canal and external ear normal.     Nose: No congestion or rhinorrhea.  Eyes:     Extraocular Movements:  Extraocular movements intact.     Pupils: Pupils are equal, round, and reactive to light.  Cardiovascular:     Rate and Rhythm: Normal rate and regular rhythm.     Pulses: Normal pulses.     Heart sounds: Normal heart sounds. No murmur heard. Pulmonary:     Effort: Pulmonary effort is normal. No respiratory distress.     Breath sounds: Normal breath sounds. No wheezing, rhonchi or rales.  Abdominal:     General: Bowel sounds are normal.     Palpations: Abdomen is soft. There is no mass.     Tenderness: There is no abdominal tenderness. There is no guarding.     Hernia: No hernia is present.  Musculoskeletal:        General: Normal range of motion.     Cervical back: Normal range of motion and neck supple.  Skin:    General: Skin is warm and dry.  Neurological:     Mental Status: She is alert and oriented to person, place, and time.  Psychiatric:        Behavior: Behavior normal.   BP 116/68   Pulse 68   Temp 98 F (36.7 C)   Resp 16   Wt 269 lb 6.4 oz (122.2 kg)   SpO2 96%   BMI 42.19 kg/m  Wt Readings from Last 3 Encounters:  06/07/21 269 lb 6.4 oz (122.2 kg)  04/24/21 266 lb 6.4 oz (120.8 kg)  03/01/21 269 lb (122 kg)     Lab Results  Component Value Date   WBC 6.0 06/07/2021   HGB 13.2 06/07/2021   HCT 40.5 06/07/2021   PLT 180.0 06/07/2021   GLUCOSE 91 06/07/2021   CHOL 188 06/07/2021   TRIG 87.0 06/07/2021   HDL 73.20 06/07/2021   LDLDIRECT 103.0 06/03/2017   LDLCALC 97 06/07/2021   ALT 18 06/07/2021   AST 19 06/07/2021   NA 136 06/07/2021   K 4.8 06/07/2021   CL 101 06/07/2021   CREATININE 1.07 06/07/2021   BUN 25 (H) 06/07/2021   CO2 26 06/07/2021   TSH 0.22 (L) 06/07/2021   HGBA1C 5.6 06/07/2021    No results found.   Assessment & Plan:  Plan    No orders of the defined types were placed in this  encounter.   Problem List Items Addressed This Visit     Hyperlipidemia - Primary    Encourage heart healthy diet such as MIND or DASH diet,  increase exercise, avoid trans fats, simple carbohydrates and processed foods, consider a krill or fish or flaxseed oil cap daily.       Relevant Orders   Lipid panel (Completed)   Hypertension   Relevant Orders   CBC (Completed)   Comprehensive metabolic panel (Completed)   Migraine   Class 2 severe obesity with serious comorbidity and body mass index (BMI) of 36.0 to 36.9 in adult, unspecified obesity type ([place])    Encouraged DASH or MIND diet, decrease po intake and increase exercise as tolerated. Needs 7-8 hours of sleep nightly. Avoid trans fats, eat small, frequent meals every 4-5 hours with lean proteins, complex carbs and healthy fats. Minimize simple carbs, high fat foods and processed foods      Insomnia   Relevant Orders   Ambulatory referral to Pulmonology   Thyroiditis, subacute   Relevant Orders   TSH (Completed)   T4, free (Completed)   Fatigue   Relevant Orders   Ambulatory referral to Pulmonology   Vitamin D deficiency    Supplement and monitor      Relevant Orders   VITAMIN D 25 Hydroxy (Vit-D Deficiency, Fractures) (Completed)   Hyperglycemia    hgba1c acceptable, minimize simple carbs. Increase exercise as tolerated. Given flu shot      Relevant Orders   Hemoglobin A1c (Completed)   Snoring    Referred for sleep study, given headaches, fatigue, insomnia, restless sleep      Relevant Orders   TSH (Completed)   Ambulatory referral to Pulmonology   Other Visit Diagnoses     Need for influenza vaccination       Relevant Orders   Flu Vaccine QUAD High Dose(Fluad) (Completed)       Follow-up: Return in about 3 months (around 09/06/2021).  I, Suezanne Jacquet, acting as a scribe for Penni Homans, MD, have documented all relevent documentation on behalf of Penni Homans, MD, as directed by Penni Homans, MD while in the presence of Penni Homans, MD. DO:06/10/21.  I, [name], MD personally performed the services described in this documentation. All  medical record entries made by the scribe were at my direction and in my presence. I have reviewed the chart and agree that the record reflects my personal performance and is accurate and complete

## 2021-06-07 NOTE — Patient Instructions (Signed)
Sleep Apnea Sleep apnea is a condition in which breathing pauses or becomes shallow during sleep. People with sleep apnea usually snore loudly. They may have times when they gasp and stop breathing for 10 seconds or more during sleep. This may happen many times during the night. Sleep apnea disrupts your sleep and keeps your body from getting the rest that it needs. This condition can increase your risk of certain health problems, including: Heart attack. Stroke. Obesity. Type 2 diabetes. Heart failure. Irregular heartbeat. High blood pressure. The goal of treatment is to help you breathe normally again. What are the causes? The most common cause of sleep apnea is a collapsed or blocked airway. There are three kinds of sleep apnea: Obstructive sleep apnea. This kind is caused by a blocked or collapsed airway. Central sleep apnea. This kind happens when the part of the brain that controls breathing does not send the correct signals to the muscles that control breathing. Mixed sleep apnea. This is a combination of obstructive and central sleep apnea. What increases the risk? You are more likely to develop this condition if you: Are overweight. Smoke. Have a smaller than normal airway. Are older. Are female. Drink alcohol. Take sedatives or tranquilizers. Have a family history of sleep apnea. Have a tongue or tonsils that are larger than normal. What are the signs or symptoms? Symptoms of this condition include: Trouble staying asleep. Loud snoring. Morning headaches. Waking up gasping. Dry mouth or sore throat in the morning. Daytime sleepiness and tiredness. If you have daytime fatigue because of sleep apnea, you may be more likely to have: Trouble concentrating. Forgetfulness. Irritability or mood swings. Personality changes. Feelings of depression. Sexual dysfunction. This may include loss of interest if you are female, or erectile dysfunction if you are female. How is this  diagnosed? This condition may be diagnosed with: A medical history. A physical exam. A series of tests that are done while you are sleeping (sleep study). These tests are usually done in a sleep lab, but they may also be done at home. How is this treated? Treatment for this condition aims to restore normal breathing and to ease symptoms during sleep. It may involve managing health issues that can affect breathing, such as high blood pressure or obesity. Treatment may include: Sleeping on your side. Using a decongestant if you have nasal congestion. Avoiding the use of depressants, including alcohol, sedatives, and narcotics. Losing weight if you are overweight. Making changes to your diet. Quitting smoking. Using a device to open your airway while you sleep, such as: An oral appliance. This is a custom-made mouthpiece that shifts your lower jaw forward. A continuous positive airway pressure (CPAP) device. This device blows air through a mask when you breathe out (exhale). A nasal expiratory positive airway pressure (EPAP) device. This device has valves that you put into each nostril. A bi-level positive airway pressure (BPAP) device. This device blows air through a mask when you breathe in (inhale) and breathe out (exhale). Having surgery if other treatments do not work. During surgery, excess tissue is removed to create a wider airway. Follow these instructions at home: Lifestyle Make any lifestyle changes that your health care provider recommends. Eat a healthy, well-balanced diet. Take steps to lose weight if you are overweight. Avoid using depressants, including alcohol, sedatives, and narcotics. Do not use any products that contain nicotine or tobacco. These products include cigarettes, chewing tobacco, and vaping devices, such as e-cigarettes. If you need help quitting, ask your  health care provider. General instructions Take over-the-counter and prescription medicines only as told  by your health care provider. If you were given a device to open your airway while you sleep, use it only as told by your health care provider. If you are having surgery, make sure to tell your health care provider you have sleep apnea. You may need to bring your device with you. Keep all follow-up visits. This is important. Contact a health care provider if: The device that you received to open your airway during sleep is uncomfortable or does not seem to be working. Your symptoms do not improve. Your symptoms get worse. Get help right away if: You develop: Chest pain. Shortness of breath. Discomfort in your back, arms, or stomach. You have: Trouble speaking. Weakness on one side of your body. Drooping in your face. These symptoms may represent a serious problem that is an emergency. Do not wait to see if the symptoms will go away. Get medical help right away. Call your local emergency services (911 in the [place] Do not drive yourself to the hospital. Summary Sleep apnea is a condition in which breathing pauses or becomes shallow during sleep. The most common cause is a collapsed or blocked airway. The goal of treatment is to restore normal breathing and to ease symptoms during sleep. This information is not intended to replace advice given to you by your health care provider. Make sure you discuss any questions you have with your health care provider. Document Revised: 08/25/2020 Document Reviewed: 08/25/2020 Elsevier Patient Education  2022 [employer].

## 2021-06-08 LAB — COMPREHENSIVE METABOLIC PANEL
ALT: 18 U/L (ref 0–35)
AST: 19 U/L (ref 0–37)
Albumin: 4 g/dL (ref 3.5–5.2)
Alkaline Phosphatase: 85 U/L (ref 39–117)
BUN: 25 mg/dL — ABNORMAL HIGH (ref 6–23)
CO2: 26 mEq/L (ref 19–32)
Calcium: 9.4 mg/dL (ref 8.4–10.5)
Chloride: 101 mEq/L (ref 96–112)
Creatinine, Ser: 1.07 mg/dL (ref 0.40–1.20)
GFR: 54.28 mL/min — ABNORMAL LOW (ref >60.00)
Glucose, Bld: 91 mg/dL (ref 70–99)
Potassium: 4.8 mEq/L (ref 3.5–5.1)
Sodium: 136 mEq/L (ref 135–145)
Total Bilirubin: 0.5 mg/dL (ref 0.2–1.2)
Total Protein: 6.5 g/dL (ref 6.0–8.3)

## 2021-06-08 LAB — CBC
HCT: 40.5 % (ref 36.0–46.0)
Hemoglobin: 13.2 g/dL (ref 12.0–15.0)
MCHC: 32.7 g/dL (ref 30.0–36.0)
MCV: 88.8 fl (ref 78.0–100.0)
Platelets: 180 10*3/uL (ref 150.0–400.0)
RBC: 4.56 Mil/uL (ref 3.87–5.11)
RDW: 15.2 % (ref 11.5–15.5)
WBC: 6 10*3/uL (ref 4.0–10.5)

## 2021-06-08 LAB — LIPID PANEL
Cholesterol: 188 mg/dL (ref 0–200)
HDL: 73.2 mg/dL (ref >39.00)
LDL Cholesterol: 97 mg/dL (ref 0–99)
NonHDL: 114.6
Total CHOL/HDL Ratio: 3
Triglycerides: 87 mg/dL (ref 0.0–149.0)
VLDL: 17.4 mg/dL (ref 0.0–40.0)

## 2021-06-08 LAB — TSH: TSH: 0.22 u[IU]/mL — ABNORMAL LOW (ref 0.35–5.50)

## 2021-06-08 LAB — VITAMIN D 25 HYDROXY (VIT D DEFICIENCY, FRACTURES): VITD: 82.39 ng/mL (ref 30.00–100.00)

## 2021-06-08 LAB — HEMOGLOBIN A1C: Hgb A1c MFr Bld: 5.6 % (ref 4.6–6.5)

## 2021-06-08 LAB — T4, FREE: Free T4: 0.89 ng/dL (ref 0.60–1.60)

## 2021-06-10 DIAGNOSIS — R0683 Snoring: Secondary | ICD-10-CM | POA: Insufficient documentation

## 2021-06-10 DIAGNOSIS — R739 Hyperglycemia, unspecified: Secondary | ICD-10-CM | POA: Insufficient documentation

## 2021-06-10 NOTE — Assessment & Plan Note (Signed)
>>  ASSESSMENT AND PLAN FOR CLASS 2 SEVERE OBESITY WITH SERIOUS COMORBIDITY AND BODY MASS INDEX (BMI) OF 36.0 TO 36.9 IN ADULT, UNSPECIFIED OBESITY TYPE (HCC) WRITTEN ON 06/10/2021  9:47 PM BY BLYTH, STACEY A, MD  Encouraged DASH or MIND diet, decrease po intake and increase exercise as tolerated. Needs 7-8 hours of sleep nightly. Avoid trans fats, eat small, frequent meals every 4-5 hours with lean proteins, complex carbs and healthy fats. Minimize simple carbs, high fat foods and processed foods

## 2021-06-10 NOTE — Assessment & Plan Note (Signed)
Referred for sleep study, given headaches, fatigue, insomnia, restless sleep

## 2021-06-10 NOTE — Assessment & Plan Note (Signed)
Supplement and monitor 

## 2021-06-10 NOTE — Assessment & Plan Note (Signed)
Encourage heart healthy diet such as MIND or DASH diet, increase exercise, avoid trans fats, simple carbohydrates and processed foods, consider a krill or fish or flaxseed oil cap daily.  °

## 2021-06-10 NOTE — Assessment & Plan Note (Signed)
hgba1c acceptable, minimize simple carbs. Increase exercise as tolerated. Given flu shot

## 2021-06-10 NOTE — Assessment & Plan Note (Signed)
Encouraged DASH or MIND diet, decrease po intake and increase exercise as tolerated. Needs 7-8 hours of sleep nightly. Avoid trans fats, eat small, frequent meals every 4-5 hours with lean proteins, complex carbs and healthy fats. Minimize simple carbs, high fat foods and processed foods 

## 2021-06-12 ENCOUNTER — Encounter: Payer: Self-pay | Admitting: Family Medicine

## 2021-06-13 ENCOUNTER — Other Ambulatory Visit: Payer: Self-pay | Admitting: Family Medicine

## 2021-06-13 MED ORDER — GABAPENTIN 100 MG PO CAPS: 100.0000 mg | ORAL_CAPSULE | Freq: Three times a day (TID) | ORAL | 1 refills | Status: DC

## 2021-06-13 NOTE — Telephone Encounter (Signed)
Pt is aware.  

## 2021-06-13 NOTE — Telephone Encounter (Signed)
Pt would like to try gabapentin. Pt declined pain management and oxycodone due to the liver damage it may cause.

## 2021-06-19 DIAGNOSIS — Z952 Presence of prosthetic heart valve: Secondary | ICD-10-CM | POA: Diagnosis not present

## 2021-06-19 DIAGNOSIS — Z9889 Other specified postprocedural states: Secondary | ICD-10-CM | POA: Diagnosis not present

## 2021-06-19 DIAGNOSIS — I480 Paroxysmal atrial fibrillation: Secondary | ICD-10-CM | POA: Diagnosis not present

## 2021-06-20 DIAGNOSIS — K219 Gastro-esophageal reflux disease without esophagitis: Secondary | ICD-10-CM | POA: Diagnosis not present

## 2021-06-20 DIAGNOSIS — K449 Diaphragmatic hernia without obstruction or gangrene: Secondary | ICD-10-CM | POA: Diagnosis not present

## 2021-06-20 DIAGNOSIS — Z952 Presence of prosthetic heart valve: Secondary | ICD-10-CM | POA: Diagnosis not present

## 2021-06-20 DIAGNOSIS — R7303 Prediabetes: Secondary | ICD-10-CM | POA: Diagnosis not present

## 2021-06-20 DIAGNOSIS — M1711 Unilateral primary osteoarthritis, right knee: Secondary | ICD-10-CM | POA: Diagnosis not present

## 2021-06-20 DIAGNOSIS — I10 Essential (primary) hypertension: Secondary | ICD-10-CM | POA: Diagnosis not present

## 2021-06-20 DIAGNOSIS — G8929 Other chronic pain: Secondary | ICD-10-CM | POA: Diagnosis not present

## 2021-06-20 DIAGNOSIS — Z86718 Personal history of other venous thrombosis and embolism: Secondary | ICD-10-CM | POA: Diagnosis not present

## 2021-06-20 DIAGNOSIS — M5442 Lumbago with sciatica, left side: Secondary | ICD-10-CM | POA: Diagnosis not present

## 2021-06-20 DIAGNOSIS — M797 Fibromyalgia: Secondary | ICD-10-CM | POA: Diagnosis not present

## 2021-06-20 DIAGNOSIS — Z95818 Presence of other cardiac implants and grafts: Secondary | ICD-10-CM | POA: Diagnosis not present

## 2021-06-22 ENCOUNTER — Other Ambulatory Visit: Payer: Self-pay | Admitting: General Surgery

## 2021-06-22 ENCOUNTER — Other Ambulatory Visit (HOSPITAL_COMMUNITY): Payer: Self-pay | Admitting: General Surgery

## 2021-06-25 DIAGNOSIS — F5089 Other specified eating disorder: Secondary | ICD-10-CM | POA: Diagnosis not present

## 2021-06-26 DIAGNOSIS — F5089 Other specified eating disorder: Secondary | ICD-10-CM | POA: Diagnosis not present

## 2021-06-28 ENCOUNTER — Encounter: Payer: PPO | Admitting: Family Medicine

## 2021-06-29 ENCOUNTER — Encounter: Payer: Self-pay | Admitting: Family Medicine

## 2021-07-02 ENCOUNTER — Other Ambulatory Visit: Payer: Self-pay | Admitting: Family Medicine

## 2021-07-02 MED ORDER — OXYCODONE HCL 10 MG PO TABS: 10.0000 mg | ORAL_TABLET | Freq: Three times a day (TID) | ORAL | 0 refills | Status: DC | PRN

## 2021-07-05 ENCOUNTER — Telehealth: Payer: Self-pay

## 2021-07-05 NOTE — Telephone Encounter (Signed)
LVM to call and get scheduled for a Wednesday with Dr. [name] at 9:30 in 3-5 weeks

## 2021-07-05 NOTE — Telephone Encounter (Signed)
Lvm and also made a note in chart with details

## 2021-07-13 ENCOUNTER — Other Ambulatory Visit: Payer: Self-pay

## 2021-07-13 ENCOUNTER — Ambulatory Visit (HOSPITAL_COMMUNITY)
Admission: RE | Admit: 2021-07-13 | Discharge: 2021-07-13 | Disposition: A | Discharge status: Home / Self Care | Payer: PPO | Source: Ambulatory Visit | Attending: General Surgery | Admitting: General Surgery

## 2021-07-13 DIAGNOSIS — Z01818 Encounter for other preprocedural examination: Secondary | ICD-10-CM | POA: Diagnosis not present

## 2021-07-13 DIAGNOSIS — J9811 Atelectasis: Secondary | ICD-10-CM | POA: Diagnosis not present

## 2021-07-18 ENCOUNTER — Other Ambulatory Visit: Payer: Self-pay

## 2021-07-18 ENCOUNTER — Encounter: Payer: Self-pay | Admitting: Internal Medicine

## 2021-07-18 ENCOUNTER — Ambulatory Visit (INDEPENDENT_AMBULATORY_CARE_PROVIDER_SITE_OTHER): Payer: PPO | Admitting: Internal Medicine

## 2021-07-18 VITALS — BP 136/80 | HR 88 | Temp 97.7°F | Resp 18 | Ht 67.0 in | Wt 269.1 lb

## 2021-07-18 DIAGNOSIS — J40 Bronchitis, not specified as acute or chronic: Secondary | ICD-10-CM | POA: Diagnosis not present

## 2021-07-18 DIAGNOSIS — J040 Acute laryngitis: Secondary | ICD-10-CM

## 2021-07-18 MED ORDER — AMOXICILLIN 875 MG PO TABS: 875.0000 mg | ORAL_TABLET | Freq: Two times a day (BID) | ORAL | 0 refills | Status: DC

## 2021-07-18 NOTE — Progress Notes (Signed)
Subjective:    Patient ID: Alexandra Patterson, female    DOB: May 05, 1956, 65 y.o.   MRN: [id number]  DOS:  07/18/2021 Type of visit - description: Acute Symptoms a started 6 weeks ago with nose congestion, throat congestion. She also has postnasal dripping, in the last few days the dripping has been yellowish w/ some blood. She is only taking Mucinex and saline sprays. Symptoms are somewhat worse in the last few days. 3 days ago she started to notice a hoarse voice and also cough. Has  felt some chest wheezing.  No history of asthma or COPD.  No fever, no chest pain no difficulty breathing Mild nausea but no vomiting No myalgias.     Review of Systems See above   Past Medical History:  Diagnosis Date   Allergy    Anxiety    Aortic stenosis    Atrial fibrillation (HCC)    Back pain    Bilateral swelling of feet    Bursitis    Cervical cancer screening 09/07/2015   Menarche at 11 Irregular and heavy and painful flow secondary to fibroids No history of abnormal pap in past, last pap roughly 2003 G2P2, s/p 2 SVD history of abnormal MGM, 1 abnl bx right breast benign, normal otherwise Noconcerns today TAH b/l SPO for fibroids and migrainesand breast bx on right gyn surgeries   Constipation    Cough 08/13/2015   Depression    Depression with anxiety 06/29/2016   Fainting    once to due heart out of rythm   Fibromyalgia    Frequent headaches    GERD (gastroesophageal reflux disease)    Hair loss disorder 06/03/2017   Heartburn    History of blood clots    History of chicken pox    Hx of blood clots    dvt   Hyperlipidemia    Hyperplastic colon polyp    Hypertension    Hypothyroid    Insomnia 08/13/2015   Internal hemorrhoids    Interstitial cystitis    Joint pain    Lesion of lung    xray in 2014 thought to be benign seen at [facility] pulmonology   Measles    h/o   Migraine    Obesity    Osteoarthritis    Preventative health care 02/03/2017   Raynaud's disease    S/P  AVR (aortic valve replacement) 03/19/2015   Shoulder pain 06/03/2017   UTI (lower urinary tract infection)    UTI (urinary tract infection) 02/11/2016    Past Surgical History:  Procedure Laterality Date   ABDOMINAL HYSTERECTOMY     TAH SPO   AORTIC VALVE REPLACEMENT  2014   with Maze   ATRIAL ABLATION SURGERY     BLADDER SUSPENSION     BREAST BIOPSY Right    Needle Biopsy   COLONOSCOPY WITH PROPOFOL N/A 12/28/2020   Procedure: COLONOSCOPY WITH PROPOFOL;  Surgeon: Virgel Manifold, MD;  Location: [facility] ENDOSCOPY;  Service: Endoscopy;  Laterality: N/A;   CORONARY ARTERY BYPASS GRAFT     DILATION AND CURETTAGE OF UTERUS     x 3   ESOPHAGOGASTRODUODENOSCOPY (EGD) WITH PROPOFOL N/A 12/28/2020   Procedure: ESOPHAGOGASTRODUODENOSCOPY (EGD) WITH PROPOFOL;  Surgeon: Virgel Manifold, MD;  Location: [facility] ENDOSCOPY;  Service: Endoscopy;  Laterality: N/A;   loop heart     SHOULDER SURGERY Left    arthroscopy for spurs   TONSILLECTOMY     TUBAL LIGATION      Allergies as of 07/18/2021  Reactions   Ciprofloxacin Rash, Shortness Of Breath   Sulfa Antibiotics Hives   Sulfasalazine Hives   Cortisone Other (See Comments)   Red flush and anxiety   Tramadol Other (See Comments)   headache   Zonisamide Other (See Comments)   unk   Erythromycin Rash   Erythromycin Base Rash   Levofloxacin Nausea Only   Lisinopril    Other reaction(s): Cough (ALLERGY/intolerance)   Prednisone Rash, Anxiety        Medication List        Accurate as of July 18, 2021 11:59 PM. If you have any questions, ask your nurse or doctor.          amitriptyline 25 MG tablet Commonly known as: ELAVIL Take 1 tablet (25 mg total) by mouth at bedtime.   amoxicillin 875 MG tablet Commonly known as: AMOXIL Take 1 tablet (875 mg total) by mouth 2 (two) times daily. Started by: Kathlene November, MD   atorvastatin 40 MG tablet Commonly known as: LIPITOR TAKE ONE TABLET BY MOUTH EVERYDAY AT BEDTIME    busPIRone 10 MG tablet Commonly known as: BUSPAR Take 1 tablet (10 mg total) by mouth 2 (two) times daily.   cyclobenzaprine 5 MG tablet Commonly known as: FLEXERIL Take 5 mg by mouth 3 (three) times daily as needed for muscle spasms.   DULoxetine 60 MG capsule Commonly known as: CYMBALTA TAKE TWO CAPSULES BY MOUTH EVERY MORNING   esomeprazole 40 MG capsule Commonly known as: NEXIUM Take 40 mg by mouth 2 (two) times daily before a meal.   FIBER 7 PO Take by mouth.   gabapentin 100 MG capsule Commonly known as: NEURONTIN Take 1 capsule (100 mg total) by mouth 3 (three) times daily.   hydrOXYzine 25 MG tablet Commonly known as: ATARAX/VISTARIL Take 1 tablet (25 mg total) by mouth at bedtime as needed.   levothyroxine 125 MCG tablet Commonly known as: SYNTHROID TAKE ONE TABLET BY MOUTH ONCE WEEKLY BEFORE BREAKFAST ON MONDAY and TAKE ONE TABLET BY MOUTH ONCE WEEKLY BEFOR BREAKFAST ON  TUESDAYS and TAKE ONE TABLET BY MOUTH ONCE WEEKLY BEFORE BREAKFAST ON WEDNESDAYS and TAKE ONE TABLET BY MOUTH ONCE WEEKLY BEFORE BREAKFAST ON THURSDAYS and TAKE ONE TABLET BY MOUTH ONCE WEEKLY BEFORE BREAKFAST ON FRIDAYS and TAKE ONE TABLET BY MOUTH satur(acb)   losartan 100 MG tablet Commonly known as: COZAAR TAKE ONE TABLET BY MOUTH EVERY MORNING   Magnesium 400 MG Caps Take 400 mg by mouth daily. Reported on 02/13/2016   meloxicam 15 MG tablet Commonly known as: MOBIC TAKE ONE TABLET BY MOUTH EVERY MORNING   metoprolol succinate 100 MG 24 hr tablet Commonly known as: TOPROL-XL TAKE ONE TABLET BY MOUTH EVERY MORNING   metoprolol succinate 50 MG 24 hr tablet Commonly known as: TOPROL-XL TAKE ONE TABLET BY MOUTH EVERY MORNING   ondansetron 4 MG tablet Commonly known as: ZOFRAN Take 4 mg by mouth every 8 (eight) hours as needed for nausea or vomiting.   Oxycodone HCl 10 MG Tabs Take 1 tablet (10 mg total) by mouth 3 (three) times daily as needed.   rizatriptan 10 MG tablet Commonly  known as: MAXALT Take 10 mg by mouth as needed for migraine. May repeat in 2 hours if needed   Urogesic-Blue 81.6 MG Tabs Take 1 tablet (81.6 mg total) by mouth 4 (four) times daily as needed.   zonisamide 100 MG capsule Commonly known as: ZONEGRAN Take 200 mg by mouth 2 (two) times daily.  Objective:   Physical Exam BP 136/80 (BP Location: Left Arm, Patient Position: Sitting, Cuff Size: Normal)   Pulse 88   Temp 97.7 F (36.5 C) (Oral)   Resp 18   Ht 5\' 7"  (1.702 m)   Wt 269 lb 2 oz (122.1 kg)   SpO2 95%   BMI 42.15 kg/m  General:   Well developed, NAD, BMI noted. HEENT:  Normocephalic . Face symmetric, atraumatic TMs: R normal, L obscured by wax Throat symmetric, not red. Voice: Raspy. Lungs:  Rhonchi with cough, no wheezing. Normal respiratory effort, no intercostal retractions, no accessory muscle use. Heart: RRR,  no murmur.  Lower extremities: no pretibial edema bilaterally  Skin: Not pale. Not jaundice Neurologic:  alert & oriented X3.  Speech normal, gait appropriate for age and unassisted Psych--  Cognition and judgment appear intact.  Cooperative with normal attention span and concentration.  Behavior appropriate. No anxious or depressed appearing.      Assessment    65 year old female, PMH includes FM, multiple allergies, obesity, history of A. fib resolved after the maze procedure, not anticoagulated, hypertension among other problems presents with  Laryngitis, bronchitis, question of sinusitis Symptoms going on for 6 weeks, as described above, on exam she has some rhonchi on the lungs.  No wheezing, no crackles. She has multiple drug intolerances or allergies. Plan: Mucinex DM, avoid decongestants Astepro Antibiotics for 7 to 10 days (amoxicillin) Call if not better, call if she is worse.   This visit occurred during the SARS-CoV-2 public health emergency.  Safety protocols were in place, including screening questions prior to the  visit, additional usage of staff PPE, and extensive cleaning of exam room while observing appropriate contact time as indicated for disinfecting solutions.

## 2021-07-18 NOTE — Patient Instructions (Signed)
Rest, fluids , tylenol  For cough:  Take Mucinex DM twice a day as needed until better  (DM has dextromethorphan guaifenesin, they are is not  decongestant)  For nasal congestion:  Use OTC Astepro 2 nasal sprays on each side of the nose twice daily until better   Take the antibiotic as prescribed  (Amoxicillin) for either 7 or 10 days.  Call if not gradually better over the next  10 days  Call anytime if the symptoms are severe

## 2021-07-19 ENCOUNTER — Other Ambulatory Visit: Payer: Self-pay | Admitting: Family Medicine

## 2021-07-19 DIAGNOSIS — E061 Subacute thyroiditis: Secondary | ICD-10-CM

## 2021-07-30 ENCOUNTER — Telehealth: Payer: Self-pay

## 2021-07-30 NOTE — Telephone Encounter (Signed)
Patient mailed to office a request for medical necessity to be completed for [facility].  Documents were placed in Dr. Frederik Pear box for pick up by CMA.

## 2021-07-30 NOTE — Telephone Encounter (Signed)
Alexandra Patterson has form

## 2021-08-01 ENCOUNTER — Encounter: Payer: Self-pay | Admitting: Skilled Nursing Facility1

## 2021-08-01 ENCOUNTER — Encounter: Payer: PPO | Attending: General Surgery | Admitting: Skilled Nursing Facility1

## 2021-08-01 ENCOUNTER — Other Ambulatory Visit: Payer: Self-pay

## 2021-08-01 DIAGNOSIS — Z6836 Body mass index (BMI) 36.0-36.9, adult: Secondary | ICD-10-CM | POA: Insufficient documentation

## 2021-08-01 NOTE — Progress Notes (Signed)
Nutrition Assessment for Bariatric Surgery Medical Nutrition Therapy Appt Start Time: 9:45    End Time: 10:45  Patient was seen on 08/01/2021 for Pre-Operative Nutrition Assessment. Letter of approval faxed to [facility] bariatric surgery program coordinator on 08/01/2021.   Referral stated Supervised Weight Loss (SWL) visits needed: 6  Planned surgery: RYGB Pt expectation of surgery: to lose weight Pt expectation of dietitian: to help educate     NUTRITION ASSESSMENT   Anthropometrics  Start weight at [facility]: 273 lbs (date: 08/01/2021)  Height: 68 in BMI: 41.51 kg/m2     Clinical  Medical hx: GERD, interstitial cystitis, insomnia, fibromyalgia  Medications: Zofran, levothyroxine, oxycodone, metoprolol, melxicam, buspirone, cyclobenzaprine, losartan, nexium Labs:  Notable signs/symptoms: pain fibromyalgia  Any previous deficiencies? 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   Pt states she loves ice cream. Pt states lately she has had difficulty tasting foods.  Pt states fruit is a waste of time, it doe snot do anything for her. Pt states her daughter is living with her and her son is going through a lot as well.  Pt state she has been going through a lot of stress not only with her family but alos with a lot of deaths. 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   24-Hr Dietary Recall: 100% meals bought out First Meal: eggs + gravy + toast + butter + grits (eaten out) Snack:  Second Meal:  Snack:  Third Meal: taco bell Snack:  Beverages: coffee, diet soda   Estimated Energy  Needs Calories: 1600   NUTRITION DIAGNOSIS  Overweight/obesity ([place]-3.3) related to past poor dietary habits and physical inactivity as evidenced by patient w/ planned RYGB surgery following dietary guidelines for continued weight loss.    NUTRITION INTERVENTION  Nutrition counseling (C-1) and education (E-2) to facilitate bariatric surgery goals.  Educated pt on micronutrient deficiencies post surgery and strategies to mitigate that risk   Pre-Op Goals Reviewed with the Patient Track food and beverage intake (pen and paper, MyFitness Pal, Baritastic app, etc.) Make healthy food choices while monitoring portion sizes Consume 3 meals per day or try to eat every 3-5 hours Avoid concentrated sugars and fried foods Keep sugar & fat in the single digits per serving on food labels Practice CHEWING your food (aim for applesauce consistency) Practice not drinking 15 minutes before, during, and 30 minutes after each meal and snack Avoid all carbonated beverages (ex: soda, sparkling beverages)  Limit caffeinated beverages (ex: coffee, tea, energy drinks) Avoid all sugar-sweetened beverages (ex: regular soda, sports drinks)  Avoid alcohol  Aim for 64-100 ounces of FLUID daily (with at least half of fluid intake being plain water)  Aim for at least 60-80 grams of PROTEIN daily Look for a liquid protein source that contains ?15 g protein and ?5 g carbohydrate (ex: shakes, drinks, shots) Make a list of non-food related activities Physical activity is an important part of a healthy lifestyle so keep it moving! The goal is to reach 150 minutes of exercise per week, including cardiovascular and weight baring activity: youtube arm chair  exercises  *Goals that are bolded indicate the pt would like to start working towards these  Handouts Provided Include  Bariatric Surgery handouts (Nutrition Visits, Pre-Op Goals, Protein Shakes, Vitamins & Minerals)  Learning Style & Readiness for Change Teaching  method utilized: Visual & Auditory  Demonstrated degree of understanding via: Teach Back  Readiness Level: contemplative Barriers to learning/adherence to lifestyle change: emotional eating  RD's Notes for Next Visit Assess pts adherence to chosen goals     MONITORING & EVALUATION Dietary intake, weekly physical activity, body weight, and pre-op goals reached at next nutrition visit.    Next Steps  Patient is to follow up at [facility] for Pre-Op Class >2 weeks before surgery for further nutrition education.

## 2021-08-01 NOTE — Telephone Encounter (Signed)
Dr. [name] want [place] to call patient to see what she has tried for weight loss attempts.  She stated that she has tried also weight watchers.  Form has been filled out just waiting on Dr. [name] signature for completion.

## 2021-08-02 NOTE — Telephone Encounter (Signed)
Form faxed to Dr. Laqueta Linden office and original mailed to patient.  Copy sent to scan

## 2021-08-08 DIAGNOSIS — G43009 Migraine without aura, not intractable, without status migrainosus: Secondary | ICD-10-CM | POA: Diagnosis not present

## 2021-08-16 ENCOUNTER — Encounter: Payer: Self-pay | Admitting: Family Medicine

## 2021-08-16 ENCOUNTER — Telehealth: Payer: Self-pay | Admitting: *Deleted

## 2021-08-16 ENCOUNTER — Telehealth (INDEPENDENT_AMBULATORY_CARE_PROVIDER_SITE_OTHER): Payer: PPO | Admitting: Family Medicine

## 2021-08-16 VITALS — Temp 96.6°F

## 2021-08-16 DIAGNOSIS — R0981 Nasal congestion: Secondary | ICD-10-CM | POA: Diagnosis not present

## 2021-08-16 DIAGNOSIS — R059 Cough, unspecified: Secondary | ICD-10-CM | POA: Diagnosis not present

## 2021-08-16 MED ORDER — AMOXICILLIN-POT CLAVULANATE 875-125 MG PO TABS: 1.0000 | ORAL_TABLET | Freq: Two times a day (BID) | ORAL | 0 refills | Status: DC

## 2021-08-16 MED ORDER — BENZONATATE 100 MG PO CAPS: ORAL_CAPSULE | ORAL | 0 refills | Status: DC

## 2021-08-16 MED ORDER — ALBUTEROL SULFATE HFA 108 (90 BASE) MCG/ACT IN AERS: 2.0000 | INHALATION_SPRAY | Freq: Four times a day (QID) | RESPIRATORY_TRACT | 0 refills | Status: DC | PRN

## 2021-08-16 NOTE — Telephone Encounter (Signed)
Per Dr [name] I called the patient and advised her instructions for Tessalon should be 1-2 capsules up to twice daily. Dr [name] stated she mistyped initially and said 1-3 capsules and was not able to reach the pharmacy with this information.

## 2021-08-16 NOTE — Telephone Encounter (Signed)
Pt seen by Dr. [name] today.

## 2021-08-16 NOTE — Patient Instructions (Addendum)
-  I sent the medication(s) we discussed to your pharmacy: Meds ordered this encounter  Medications   amoxicillin-clavulanate (AUGMENTIN) 875-125 MG tablet    Sig: Take 1 tablet by mouth 2 (two) times daily.    Dispense:  20 tablet    Refill:  0   albuterol (PROAIR HFA) 108 (90 Base) MCG/ACT inhaler    Sig: Inhale 2 puffs into the lungs every 6 (six) hours as needed for wheezing or shortness of breath.    Dispense:  1 each    Refill:  0   benzonatate (TESSALON PERLES) 100 MG capsule    Sig: 1-2 capsules up to twice daily as needed    Dispense:  30 capsule    Refill:  0   Check a covid test - if positive, in first 5 days and you desire an antiviral you may schedule a follow up video visit or contact a Adult nurse.   I hope you are feeling better soon!  Seek in person care promptly if your symptoms worsen, new concerns arise or you are not improving with treatment.  It was nice to meet you today. I help [facility] out with telemedicine visits on Tuesdays and Thursdays and am available for visits on those days. If you have any concerns or questions following this visit please schedule a follow up visit with your Primary Care doctor or seek care at a local urgent care clinic to avoid delays in care.

## 2021-08-16 NOTE — Progress Notes (Signed)
Virtual Visit via Video Note  I connected with Alexandra Patterson  on 08/16/21 at 12:40 PM EST by a video enabled telemedicine application and verified that I am speaking with the correct person using two identifiers.  Location patient: home, [place] Location provider:work or home office Persons participating in the virtual visit: patient, provider  I discussed the limitations of evaluation and management by telemedicine and the availability of in person appointments. The patient expressed understanding and agreed to proceed.   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 -Has tried: musinex, nasal saline -Pertinent past medical history: hx of bad allergies, had shots in the past, does not do well with prednisonesteroid, antihistamine worsened her depression -Pertinent medication allergies:  Allergies  Allergen Reactions   Ciprofloxacin Rash and Shortness Of Breath   Sulfa Antibiotics Hives   Sulfasalazine Hives   Cortisone Other (See Comments)    Red flush and anxiety   Tramadol Other (See Comments)    headache   Zonisamide Other (See Comments)    unk   Erythromycin Rash   Erythromycin Base Rash   Levofloxacin Nausea Only   Lisinopril     Other reaction(s): Cough (ALLERGY/intolerance)   Prednisone Rash and Anxiety   -COVID-19 vaccine status: Immunization History  Administered Date(s) Administered   Fluad Quad(high Dose 65+) 07/27/2020, 06/07/2021   Influenza,inj,Quad PF,6+ Mos 07/29/2019   PFIZER(Purple Top)SARS-COV-2 Vaccination 12/23/2019, 01/17/2020, 11/27/2020, 06/26/2021   Pneumococcal  Polysaccharide-23 07/27/2020   Tdap 05/26/2015   Zoster Recombinat (Shingrix) 05/29/2018, 08/01/2018   Zoster, Live 05/26/2015     ROS: See pertinent positives and negatives per HPI.  Past Medical History:  Diagnosis Date   Allergy    Anxiety    Aortic stenosis    Atrial fibrillation (HCC)    Back pain    Bilateral swelling of feet    Bursitis    Cervical cancer screening 09/07/2015   Menarche at 11 Irregular and heavy and painful flow secondary to fibroids No history of abnormal pap in past, last pap roughly 2003 G2P2, s/p 2 SVD history of abnormal MGM, 1 abnl bx right breast benign, normal otherwise Noconcerns today TAH b/l SPO for fibroids and migrainesand breast bx on right gyn surgeries   Constipation    Cough 08/13/2015   Depression    Depression with anxiety 06/29/2016   Fainting    once to due heart out of rythm   Fibromyalgia    Frequent headaches    GERD (gastroesophageal reflux disease)    Hair loss disorder 06/03/2017   Heartburn    History of blood clots    History of chicken pox    Hx of blood clots    dvt   Hyperlipidemia    Hyperplastic colon polyp    Hypertension    Hypothyroid    Insomnia 08/13/2015   Internal hemorrhoids    Interstitial cystitis    Joint pain    Lesion of lung    xray in 2014 thought to be benign seen at [facility] pulmonology   Measles    h/o   Migraine    Obesity    Osteoarthritis  Preventative health care 02/03/2017   Raynaud's disease    S/P AVR (aortic valve replacement) 03/19/2015   Shoulder pain 06/03/2017   UTI (lower urinary tract infection)    UTI (urinary tract infection) 02/11/2016    Past Surgical History:  Procedure Laterality Date   ABDOMINAL HYSTERECTOMY     TAH SPO   AORTIC VALVE REPLACEMENT  2014   with Maze   ATRIAL ABLATION SURGERY     BLADDER SUSPENSION     BREAST BIOPSY Right    Needle Biopsy   COLONOSCOPY WITH PROPOFOL N/A 12/28/2020   Procedure: COLONOSCOPY WITH PROPOFOL;  Surgeon: Virgel Manifold, MD;  Location: [facility] ENDOSCOPY;  Service: Endoscopy;  Laterality: N/A;   CORONARY ARTERY BYPASS GRAFT     DILATION AND CURETTAGE OF UTERUS     x 3   ESOPHAGOGASTRODUODENOSCOPY (EGD) WITH PROPOFOL N/A 12/28/2020   Procedure: ESOPHAGOGASTRODUODENOSCOPY (EGD) WITH PROPOFOL;  Surgeon: Virgel Manifold, MD;  Location: [facility] ENDOSCOPY;  Service: Endoscopy;  Laterality: N/A;   loop heart     SHOULDER SURGERY Left    arthroscopy for spurs   TONSILLECTOMY     TUBAL LIGATION       Current Outpatient Medications:    albuterol (PROAIR HFA) 108 (90 Base) MCG/ACT inhaler, Inhale 2 puffs into the lungs every 6 (six) hours as needed for wheezing or shortness of breath., Disp: 1 each, Rfl: 0   amitriptyline (ELAVIL) 25 MG tablet, Take 1 tablet (25 mg total) by mouth at bedtime., Disp: , Rfl:    amoxicillin-clavulanate (AUGMENTIN) 875-125 MG tablet, Take 1 tablet by mouth 2 (two) times daily., Disp: 20 tablet, Rfl: 0   atorvastatin (LIPITOR) 40 MG tablet, TAKE ONE TABLET BY MOUTH EVERYDAY AT BEDTIME, Disp: 90 tablet, Rfl: 1   busPIRone (BUSPAR) 10 MG tablet, Take 1 tablet (10 mg total) by mouth 2 (two) times daily., Disp: 180 tablet, Rfl: 1   cyclobenzaprine (FLEXERIL) 5 MG tablet, Take 5 mg by mouth 3 (three) times daily as needed for muscle spasms., Disp: , Rfl:    DULoxetine (CYMBALTA) 60 MG capsule, TAKE TWO CAPSULES BY MOUTH EVERY MORNING, Disp: 180 capsule, Rfl: 1   esomeprazole (NEXIUM) 40 MG capsule, Take 40 mg by mouth 2 (two) times daily before a meal. , Disp: , Rfl:    hydrOXYzine (ATARAX/VISTARIL) 25 MG tablet, Take 1 tablet (25 mg total) by mouth at bedtime as needed., Disp: 90 tablet, Rfl: 1   levothyroxine (SYNTHROID) 125 MCG tablet, TAKE ONE TABLET BY MOUTH ONCE WEEKLY BEFORE BREAKFAST ON MONDAY and TAKE ONE TABLET BY MOUTH ONCE WEEKLY BEFOR BREAKFAST ON  TUESDAYS and TAKE ONE TABLET BY MOUTH ONCE WEEKLY BEFORE BREAKFAST ON WEDNESDAYS and TAKE ONE TABLET BY MOUTH ONCE WEEKLY BEFORE  BREAKFAST ON THURSDAYS and TAKE ONE TABLET BY MOUTH ONCE WEEKLY BEFORE BREAKFAST ON FRIDAYS and TAKE ONE TABLET BY MOUTH ONCE WEEKLY BEFORE BREAKFAST ON SATURDAY, Disp: 78 tablet, Rfl: 1   losartan (COZAAR) 100 MG tablet, TAKE ONE TABLET BY MOUTH EVERY MORNING, Disp: 90 tablet, Rfl: 1   Magnesium 400 MG CAPS, Take 400 mg by mouth daily. Reported on 02/13/2016, Disp: , Rfl:    meloxicam (MOBIC) 15 MG tablet, TAKE ONE TABLET BY MOUTH EVERY MORNING, Disp: 90 tablet, Rfl: 1   Methen-Hyosc-Meth Blue-Na Phos (UROGESIC-BLUE) 81.6 MG TABS, Take 1 tablet (81.6 mg total) by mouth 4 (four) times daily as needed., Disp: , Rfl:    metoprolol succinate (TOPROL-XL) 100 MG 24 hr tablet, TAKE  ONE TABLET BY MOUTH EVERY MORNING, Disp: 90 tablet, Rfl: 1   metoprolol succinate (TOPROL-XL) 50 MG 24 hr tablet, TAKE ONE TABLET BY MOUTH EVERY MORNING, Disp: 90 tablet, Rfl: 1   Misc Natural Products (FIBER 7 PO), Take by mouth., Disp: , Rfl:    ondansetron (ZOFRAN) 4 MG tablet, Take 4 mg by mouth every 8 (eight) hours as needed for nausea or vomiting., Disp: , Rfl:    Oxycodone HCl 10 MG TABS, Take 1 tablet (10 mg total) by mouth 3 (three) times daily as needed., Disp: 90 tablet, Rfl: 0   rizatriptan (MAXALT) 10 MG tablet, Take 10 mg by mouth as needed for migraine. May repeat in 2 hours if needed, Disp: , Rfl:    zonisamide (ZONEGRAN) 100 MG capsule, Take 200 mg by mouth 2 (two) times daily., Disp: , Rfl:    benzonatate (TESSALON PERLES) 100 MG capsule, 1-2 capsules up to twice daily as needed, Disp: 30 capsule, Rfl: 0  EXAM:  VITALS per patient if applicable:  GENERAL: alert, oriented, appears well and in no acute distress  HEENT: atraumatic, conjunttiva clear, no obvious abnormalities on inspection of external nose and ears  NECK: normal movements of the head and neck  LUNGS: on inspection no signs of respiratory distress, breathing rate appears normal, no obvious gross SOB, gasping or wheezing  CV: no obvious  cyanosis  MS: moves all visible extremities without noticeable abnormality  PSYCH/NEURO: pleasant and cooperative, no obvious depression or anxiety, speech and thought processing grossly intact  ASSESSMENT AND PLAN:  Discussed the following assessment and plan:  Cough, unspecified type  Nasal congestion  -we discussed possible serious and likely etiologies, options for evaluation and workup, limitations of telemedicine visit vs in person visit, treatment, treatment risks and precautions. Pt is agreeable to treatment via telemedicine at this moment. Query viral resp illness, developing sinusitis or bacterial resp infection vs other. Discussed possibility of covid, flu, rsv, etc. She feels is developing a bacterial illness and bronchitis from chronic issues. Has opted to do home covid test, treat empirically with augmenting, tessalon for cough and prn albuterol. Discussed options for antiviral for covid if positive covid test. Summarized in patient instructions.  Work/School slipped offered: declined Advised to seek close follow up with  vv or in person care if worsening, new symptoms arise, or if is not improving with treatment. Discussed options for inperson care if PCP office not available. Did let this patient know that I only do telemedicine on Tuesdays and Thursdays for [place]. Advised to schedule follow up visit with PCP or [facility] if any further questions or concerns to avoid delays in care.   I discussed the assessment and treatment plan with the patient. The patient was provided an opportunity to ask questions and all were answered. The patient agreed with the plan and demonstrated an understanding of the instructions.     Lucretia Kern, DO

## 2021-08-21 ENCOUNTER — Telehealth: Payer: Self-pay | Admitting: *Deleted

## 2021-08-21 NOTE — Telephone Encounter (Signed)
-----   Message from Lucretia Kern, DO sent at 08/21/2021  2:49 PM EST ----- Could you reach out to [facility] today. Let her know I just wanted to see how she is doing and hope she if feeling better! Help her with scheduling follow up if not better or concerns. Thanks! ----- Message ----- From: Lucretia Kern, DO Sent: 08/21/2021   9:35 AM EST To: Lucretia Kern, DO  Check on pt.

## 2021-08-21 NOTE — Telephone Encounter (Signed)
Spoke with the patient and informed her of the message below.  Patient stated she is feeling better and message was forwarded to Dr [name].

## 2021-08-22 ENCOUNTER — Encounter: Payer: Self-pay | Admitting: *Deleted

## 2021-08-23 ENCOUNTER — Other Ambulatory Visit: Payer: Self-pay | Admitting: Family Medicine

## 2021-08-27 MED ORDER — OXYCODONE HCL 10 MG PO TABS: 10.0000 mg | ORAL_TABLET | Freq: Three times a day (TID) | ORAL | 0 refills | Status: DC | PRN

## 2021-08-27 NOTE — Telephone Encounter (Signed)
Requesting: oxycodone Contract: 02/17/20 UDS: 02/17/20 Last Visit: 06/07/21 Next Visit: 10/22/21 Last Refill: 07/02/21  Will update uds and contract at visit in Jan.  Please Advise

## 2021-09-06 ENCOUNTER — Ambulatory Visit (INDEPENDENT_AMBULATORY_CARE_PROVIDER_SITE_OTHER): Payer: PPO | Admitting: Family

## 2021-09-06 ENCOUNTER — Other Ambulatory Visit: Payer: Self-pay | Admitting: Family

## 2021-09-06 VITALS — BP 128/80 | HR 86 | Temp 98.3°F | Resp 18 | Wt 271.4 lb

## 2021-09-06 DIAGNOSIS — J209 Acute bronchitis, unspecified: Secondary | ICD-10-CM

## 2021-09-06 MED ORDER — HYDROCODONE BIT-HOMATROP MBR 5-1.5 MG/5ML PO SOLN: 5.0000 mL | Freq: Three times a day (TID) | ORAL | 0 refills | Status: DC | PRN

## 2021-09-06 MED ORDER — DOXYCYCLINE HYCLATE 100 MG PO TABS: 100.0000 mg | ORAL_TABLET | Freq: Two times a day (BID) | ORAL | 0 refills | Status: AC

## 2021-09-06 NOTE — Progress Notes (Signed)
Alexandra Patterson is a 65 y.o. female with the following history as recorded in Epic[employer]:  Patient Active Problem List   Diagnosis Date Noted   Hyperglycemia 06/10/2021   Snoring 06/10/2021   Encounter for screening for upper gastrointestinal disorder    Gastric polyps    Columnar-lined esophagus    Family history of colon cancer    Polyp of colon    Left hip pain 12/14/2020   Vitamin D deficiency 11/18/2019   Headache 07/25/2019   Educated about COVID-19 virus infection 05/23/2019   Chronic low back pain with left-sided sciatica 05/17/2018   Urinary frequency 05/17/2018   Fatigue 05/14/2018   Interstitial cystitis 05/14/2018   Hair loss disorder 06/03/2017   Shoulder pain 06/03/2017   Preventative health care 02/03/2017   Depression with anxiety 06/29/2016   Thyroiditis, subacute 06/04/2016   UTI (urinary tract infection) 02/11/2016   Cervical cancer screening 09/07/2015   Cough 08/13/2015   Insomnia 08/13/2015   Acute stress reaction 04/25/2015   Fibromyalgia    Lesion of lung    Allergy    GERD (gastroesophageal reflux disease)    Hyperlipidemia    Hypertension    Migraine    Hyperplastic colon polyp    Atrial fibrillation (HCC)    Hx of blood clots    Class 2 severe obesity with serious comorbidity and body mass index (BMI) of 36.0 to 36.9 in adult, unspecified obesity type (HCC)    History of chicken pox     Current Outpatient Medications  Medication Sig Dispense Refill   albuterol (PROAIR HFA) 108 (90 Base) MCG/ACT inhaler Inhale 2 puffs into the lungs every 6 (six) hours as needed for wheezing or shortness of breath. 1 each 0   amitriptyline (ELAVIL) 25 MG tablet Take 1 tablet (25 mg total) by mouth at bedtime.     atorvastatin (LIPITOR) 40 MG tablet TAKE ONE TABLET BY MOUTH EVERYDAY AT BEDTIME 90 tablet 1   busPIRone (BUSPAR) 10 MG tablet Take 1 tablet (10 mg total) by mouth 2 (two) times daily. 180 tablet 1   cyclobenzaprine (FLEXERIL) 5 MG tablet Take 5 mg by  mouth 3 (three) times daily as needed for muscle spasms.     doxycycline (VIBRA-TABS) 100 MG tablet Take 1 tablet (100 mg total) by mouth 2 (two) times daily for 14 days. 28 tablet 0   DULoxetine (CYMBALTA) 60 MG capsule TAKE TWO CAPSULES BY MOUTH EVERY MORNING 180 capsule 1   esomeprazole (NEXIUM) 40 MG capsule Take 40 mg by mouth 2 (two) times daily before a meal.      HYDROcodone bit-homatropine (HYCODAN) 5-1.5 MG/5ML syrup Take 5 mLs by mouth every 8 (eight) hours as needed for cough. 120 mL 0   hydrOXYzine (ATARAX/VISTARIL) 25 MG tablet Take 1 tablet (25 mg total) by mouth at bedtime as needed. 90 tablet 1   levothyroxine (SYNTHROID) 125 MCG tablet TAKE ONE TABLET BY MOUTH ONCE WEEKLY BEFORE BREAKFAST ON MONDAY and TAKE ONE TABLET BY MOUTH ONCE WEEKLY BEFOR BREAKFAST ON  TUESDAYS and TAKE ONE TABLET BY MOUTH ONCE WEEKLY BEFORE BREAKFAST ON WEDNESDAYS and TAKE ONE TABLET BY MOUTH ONCE WEEKLY BEFORE BREAKFAST ON THURSDAYS and TAKE ONE TABLET BY MOUTH ONCE WEEKLY BEFORE BREAKFAST ON FRIDAYS and TAKE ONE TABLET BY MOUTH ONCE WEEKLY BEFORE BREAKFAST ON SATURDAY 78 tablet 1   losartan (COZAAR) 100 MG tablet TAKE ONE TABLET BY MOUTH EVERY MORNING 90 tablet 1   Magnesium 400 MG CAPS Take 400 mg by mouth  daily. Reported on 02/13/2016     meloxicam (MOBIC) 15 MG tablet TAKE ONE TABLET BY MOUTH EVERY MORNING 90 tablet 1   Methen-Hyosc-Meth Blue-Na Phos (UROGESIC-BLUE) 81.6 MG TABS Take 1 tablet (81.6 mg total) by mouth 4 (four) times daily as needed.     metoprolol succinate (TOPROL-XL) 100 MG 24 hr tablet TAKE ONE TABLET BY MOUTH EVERY MORNING 90 tablet 1   metoprolol succinate (TOPROL-XL) 50 MG 24 hr tablet TAKE ONE TABLET BY MOUTH EVERY MORNING 90 tablet 1   Misc Natural Products (FIBER 7 PO) Take by mouth.     ondansetron (ZOFRAN) 4 MG tablet Take 4 mg by mouth every 8 (eight) hours as needed for nausea or vomiting.     Oxycodone HCl 10 MG TABS Take 1 tablet (10 mg total) by mouth 3 (three) times daily  as needed. 90 tablet 0   rizatriptan (MAXALT) 10 MG tablet Take 10 mg by mouth as needed for migraine. May repeat in 2 hours if needed     zonisamide (ZONEGRAN) 100 MG capsule Take 200 mg by mouth 2 (two) times daily.     No current facility-administered medications for this visit.    Allergies: Ciprofloxacin, Sulfa antibiotics, Sulfasalazine, Cortisone, Tramadol, Zonisamide, Erythromycin, Erythromycin base, Levofloxacin, Lisinopril, and Prednisone  Past Medical History:  Diagnosis Date   Allergy    Anxiety    Aortic stenosis    Atrial fibrillation (HCC)    Back pain    Bilateral swelling of feet    Bursitis    Cervical cancer screening 09/07/2015   Menarche at 11 Irregular and heavy and painful flow secondary to fibroids No history of abnormal pap in past, last pap roughly 2003 G2P2, s/p 2 SVD history of abnormal MGM, 1 abnl bx right breast benign, normal otherwise Noconcerns today TAH b/l SPO for fibroids and migrainesand breast bx on right gyn surgeries   Constipation    Cough 08/13/2015   Depression    Depression with anxiety 06/29/2016   Fainting    once to due heart out of rythm   Fibromyalgia    Frequent headaches    GERD (gastroesophageal reflux disease)    Hair loss disorder 06/03/2017   Heartburn    History of blood clots    History of chicken pox    Hx of blood clots    dvt   Hyperlipidemia    Hyperplastic colon polyp    Hypertension    Hypothyroid    Insomnia 08/13/2015   Internal hemorrhoids    Interstitial cystitis    Joint pain    Lesion of lung    xray in 2014 thought to be benign seen at [facility] pulmonology   Measles    h/o   Migraine    Obesity    Osteoarthritis    Preventative health care 02/03/2017   Raynaud's disease    S/P AVR (aortic valve replacement) 03/19/2015   Shoulder pain 06/03/2017   UTI (lower urinary tract infection)    UTI (urinary tract infection) 02/11/2016    Past Surgical History:  Procedure Laterality Date   ABDOMINAL  HYSTERECTOMY     TAH SPO   AORTIC VALVE REPLACEMENT  2014   with Maze   ATRIAL ABLATION SURGERY     BLADDER SUSPENSION     BREAST BIOPSY Right    Needle Biopsy   COLONOSCOPY WITH PROPOFOL N/A 12/28/2020   Procedure: COLONOSCOPY WITH PROPOFOL;  Surgeon: Virgel Manifold, MD;  Location: [facility] ENDOSCOPY;  Service: Endoscopy;  Laterality: N/A;   CORONARY ARTERY BYPASS GRAFT     DILATION AND CURETTAGE OF UTERUS     x 3   ESOPHAGOGASTRODUODENOSCOPY (EGD) WITH PROPOFOL N/A 12/28/2020   Procedure: ESOPHAGOGASTRODUODENOSCOPY (EGD) WITH PROPOFOL;  Surgeon: Virgel Manifold, MD;  Location: [facility] ENDOSCOPY;  Service: Endoscopy;  Laterality: N/A;   loop heart     SHOULDER SURGERY Left    arthroscopy for spurs   TONSILLECTOMY     TUBAL LIGATION      Family History  Problem Relation Age of Onset   Colon polyps Father    Alzheimer's disease Father    Alcohol abuse Father    Cancer Father        colon cancer   Arthritis Mother        s/p TKR   Neuropathy Mother    Hyperlipidemia Mother    Other Mother        familial mediterranean fever   Thyroid disease Mother    Hypertension Mother    Obesity Mother    Heart disease Mother    Cancer Brother        prostate cancer   Heart disease Brother        cardiomegaly   Other Brother        familial mediterranean fever   Asthma Maternal Grandmother    Congestive Heart Failure Maternal Grandmother    Heart disease Maternal Grandmother        chf   Stroke Maternal Grandfather    Diabetes Maternal Grandfather    Heart disease Maternal Grandfather        hardening of the arteries   Stroke Paternal Grandfather    Atrial fibrillation Paternal Grandfather    Alcohol abuse Paternal Grandfather    Heart disease Paternal Grandfather        afib   Interstitial cystitis Daughter    Arthritis Son    Alcohol abuse Son        in remission   Mental illness Son        depression   Other Son        interstitial cystitis   Colon cancer Other         parent   Arthritis Paternal Uncle     Social History   Tobacco Use   Smoking status: Never   Smokeless tobacco: Never  Substance Use Topics   Alcohol use: No    Subjective:   Cough x 3 weeks; did virtual visit on 11/17 and was treated with Augmentin and Tessalon Perles; improved initially but symptoms have re-flared; felt like symptoms re-flared within a week of finishing antibiotics;  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  Head: Normocephalic and atraumatic  Eyes: Sclera and conjunctiva clear; pupils round and reactive to light; extraocular movements intact  Ears: External normal; canals clear; tympanic membranes normal  Oropharynx: Pink, supple. No suspicious lesions  Neck: Supple without thyromegaly, adenopathy  Lungs: Respirations unlabored; clear to auscultation bilaterally without wheeze, rales, rhonchi  CVS exam: normal rate and regular rhythm.  Neurologic: Alert and oriented; speech intact; face symmetrical; moves all extremities well; CNII-XII intact without focal deficit  Assessment:  1. Acute bronchitis, unspecified organism     Plan:  Had normal CXR in October 2022; cannot tolerate prednisone; Rx for Doxycycline and Hycodan; work note given as requested; increase fluids, rest and follow up worse, no better; to consider referral to ENT and/or allergist if symptoms persist.   This visit occurred during the SARS-CoV-2 public health emergency.  Safety protocols were in place, including screening questions prior to the visit, additional usage of staff PPE, and extensive cleaning of exam room while observing appropriate contact time as indicated for disinfecting solutions.    No follow-ups on file.   No orders of the defined types were placed in this encounter.   Requested Prescriptions   Signed Prescriptions Disp Refills   doxycycline (VIBRA-TABS) 100 MG tablet 28 tablet 0    Sig: Take 1 tablet (100 mg total) by mouth 2 (two) times daily for 14 days.   HYDROcodone bit-homatropine (HYCODAN) 5-1.5 MG/5ML syrup 120 mL 0    Sig: Take 5 mLs by mouth every 8 (eight) hours as needed for cough.

## 2021-09-06 NOTE — Patient Instructions (Signed)
As you are already aware and we discussed, do not mix your Oxycodone with the cough syrup;  Take the Doxycycline for 10 days at least; you have a prescription for 14 days.

## 2021-09-26 ENCOUNTER — Encounter: Payer: PPO | Attending: General Surgery | Admitting: Skilled Nursing Facility1

## 2021-09-26 ENCOUNTER — Other Ambulatory Visit: Payer: Self-pay

## 2021-09-26 DIAGNOSIS — E669 Obesity, unspecified: Secondary | ICD-10-CM | POA: Diagnosis not present

## 2021-09-26 NOTE — Progress Notes (Signed)
Supervised Weight Loss Visit Bariatric Nutrition Education  SWL 1 of 6   NUTRITION ASSESSMENT  Anthropometrics  Start weight at [facility]: 273 lbs (date: 08/01/2021)  Weight: 276 lb  BMI: 41.97 kg/m2     Clinical  Medical hx: GERD, interstitial cystitis, insomnia, fibromyalgia  Medications: Zofran, levothyroxine, oxycodone, metoprolol, melxicam, buspirone, cyclobenzaprine, losartan, nexium Labs:  Notable signs/symptoms: pain fibromyalgia  Any previous deficiencies? No  Lifestyle & Dietary Hx Pt sates she is struggling with achilles pain.  Pt states she skips breakfast about 3-4 times a week  Pt states she is making an effort to drink more water but she knows it is not enough. Pt states she does not like water which makes it hard to drink.  Pt states she is having issues with drinking coffee because of nausea that she has been having recently. Pt states nausea typically happens after not eating. Dietitian educated pt on listening to hunger cues and not ignoring them.    Pt states she knows she cannot continue to  skip meals the way she does and has in the past in order to lose weight. Dietitian discussed this topic helping to pt to find motivation for having a structured diet inclusive of fruits and vegetables.    Estimated daily fluid intake: unknown oz Supplements:  Current average weekly physical activity: ADL's  24-Hr Dietary Recall First Meal: 2 coffee + sf creamer  Snack: candy Second Meal: takeout chinese  Snack:  Third Meal: cheesecake  Snack:  Beverages: coffee, diet tea, diet soda, water   Estimated Energy Needs Calories: 1600   NUTRITION DIAGNOSIS  Overweight/obesity ([place]-3.3) related to past poor dietary habits and physical inactivity as evidenced by patient w/ planned RYGB surgery following dietary guidelines for continued weight loss.   NUTRITION INTERVENTION  Nutrition counseling (C-1) and education (E-2) to facilitate bariatric surgery goals.  Pre-Op  Goals Progress & New Goals Continue: Avoid all carbonated beverages (ex: soda, sparkling beverages) Not able to accomplish   Continue: Limit caffeinated beverages (ex: coffee, tea, energy drinks) Not able to accomplish   Avoid all sugar-sweetened beverages (ex: regular soda, sports drinks) Not able to accomplish    Continue: Physical activity is an important part of a healthy lifestyle so keep it moving! The goal is to reach 150 minutes of exercise per week, including Not able to accomplish  cardiovascular and weight baring activity: youtube arm chair exercises NEW: Not skipping breakfast and eating something small like yogurt. NEW: Change mindset to focus on proper nutrition and making connections between eating and increased energy.  Handouts Provided Include  N/A  Learning Style & Readiness for Change Teaching method utilized: Visual & Auditory  Demonstrated degree of understanding via: Teach Back  Readiness Level: pre-contemplative/ contemplative  Barriers to learning/adherence to lifestyle change: Achilles pain and past dieting behaviors   RD's Notes for next Visit  Assess pts adherence to chosen goals    MONITORING & EVALUATION Dietary intake, weekly physical activity, body weight, and pre-op goals in 1 month.   Next Steps  Patient is to return to [facility] in 1 month

## 2021-10-08 ENCOUNTER — Ambulatory Visit: Payer: PPO | Admitting: Family Medicine

## 2021-10-08 ENCOUNTER — Other Ambulatory Visit: Payer: Self-pay | Admitting: Family Medicine

## 2021-10-16 ENCOUNTER — Other Ambulatory Visit: Payer: Self-pay | Admitting: Family Medicine

## 2021-10-16 MED ORDER — OXYCODONE HCL 10 MG PO TABS: 10.0000 mg | ORAL_TABLET | Freq: Three times a day (TID) | ORAL | 0 refills | Status: DC | PRN

## 2021-10-16 NOTE — Telephone Encounter (Signed)
Requesting: oxycodone 10mg  Contract: 02/17/2020 UDS: 02/17/2020 Last Visit: 09/06/2021 w/ Mickel Baas Next Visit: 10/22/2021 Last Refill: 08/27/2021 #90 and 0RF Pt sig: 1 tab tid prn  Please Advise

## 2021-10-18 ENCOUNTER — Institutional Professional Consult (permissible substitution): Payer: PPO | Admitting: Pulmonary Disease

## 2021-10-22 ENCOUNTER — Ambulatory Visit (INDEPENDENT_AMBULATORY_CARE_PROVIDER_SITE_OTHER): Payer: PPO | Admitting: Family Medicine

## 2021-10-22 ENCOUNTER — Encounter: Payer: Self-pay | Admitting: Family Medicine

## 2021-10-22 VITALS — BP 110/72 | HR 69 | Temp 97.9°F | Resp 16 | Ht 68.0 in | Wt 279.0 lb

## 2021-10-22 DIAGNOSIS — M791 Myalgia, unspecified site: Secondary | ICD-10-CM | POA: Diagnosis not present

## 2021-10-22 DIAGNOSIS — R739 Hyperglycemia, unspecified: Secondary | ICD-10-CM

## 2021-10-22 DIAGNOSIS — M5442 Lumbago with sciatica, left side: Secondary | ICD-10-CM

## 2021-10-22 DIAGNOSIS — Z6836 Body mass index (BMI) 36.0-36.9, adult: Secondary | ICD-10-CM | POA: Diagnosis not present

## 2021-10-22 DIAGNOSIS — E559 Vitamin D deficiency, unspecified: Secondary | ICD-10-CM

## 2021-10-22 DIAGNOSIS — G8929 Other chronic pain: Secondary | ICD-10-CM | POA: Diagnosis not present

## 2021-10-22 DIAGNOSIS — I10 Essential (primary) hypertension: Secondary | ICD-10-CM | POA: Diagnosis not present

## 2021-10-22 DIAGNOSIS — Z79899 Other long term (current) drug therapy: Secondary | ICD-10-CM | POA: Diagnosis not present

## 2021-10-22 DIAGNOSIS — E785 Hyperlipidemia, unspecified: Secondary | ICD-10-CM

## 2021-10-22 NOTE — Assessment & Plan Note (Signed)
Well controlled, no changes to meds. Encouraged heart healthy diet such as the DASH diet and exercise as tolerated.  °

## 2021-10-22 NOTE — Assessment & Plan Note (Signed)
>>  ASSESSMENT AND PLAN FOR CLASS 2 SEVERE OBESITY WITH SERIOUS COMORBIDITY AND BODY MASS INDEX (BMI) OF 36.0 TO 36.9 IN ADULT, UNSPECIFIED OBESITY TYPE (HCC) WRITTEN ON 10/22/2021  8:01 PM BY BLYTH, STACEY A, MD  Encouraged DASH or MIND diet, decrease po intake and increase exercise as tolerated. Needs 7-8 hours of sleep nightly. Avoid trans fats, eat small, frequent meals every 4-5 hours with lean proteins, complex carbs and healthy fats. Minimize simple carbs, high fat foods and processed foods

## 2021-10-22 NOTE — Assessment & Plan Note (Signed)
hgba1c acceptable, minimize simple carbs. Increase exercise as tolerated. Continue current meds 

## 2021-10-22 NOTE — Progress Notes (Signed)
Subjective:   By signing my name below, I, Alexandra Patterson, attest that this documentation has been prepared under the direction and in the presence of Alexandra Lukes, MD. 10/22/2021     Patient ID: Alexandra Patterson, female    DOB: October 06, 1955, 66 y.o.   MRN: [id number]  Chief Complaint  Patient presents with   3 months follow up    Leg pain is getting worse    HPI Patient is in today for an office visit.  She has been complaining of back pain, leg pain and pain in her right Achilles heel. She saw a neurologist and had an MRI. Was told she has arthritis and degenerating disks. She is seeing Ortho for her knee pain but has not seen anyone for the pain in her Achilles. She reports she has become dependent on 10 mg oxycodone and takes it 3 times daily.  She would also like to get a handicapped driving pass.  She would like to change 15 mg meloxicam to another medication because she does not think it works anymore.   Past Medical History:  Diagnosis Date   Allergy    Anxiety    Aortic stenosis    Atrial fibrillation (HCC)    Back pain    Bilateral swelling of feet    Bursitis    Cervical cancer screening 09/07/2015   Menarche at 11 Irregular and heavy and painful flow secondary to fibroids No history of abnormal pap in past, last pap roughly 2003 G2P2, s/p 2 SVD history of abnormal MGM, 1 abnl bx right breast benign, normal otherwise Noconcerns today TAH b/l SPO for fibroids and migrainesand breast bx on right gyn surgeries   Constipation    Cough 08/13/2015   Depression    Depression with anxiety 06/29/2016   Fainting    once to due heart out of rythm   Fibromyalgia    Frequent headaches    GERD (gastroesophageal reflux disease)    Hair loss disorder 06/03/2017   Heartburn    History of blood clots    History of chicken pox    Hx of blood clots    dvt   Hyperlipidemia    Hyperplastic colon polyp    Hypertension    Hypothyroid    Insomnia 08/13/2015   Internal hemorrhoids     Interstitial cystitis    Joint pain    Lesion of lung    xray in 2014 thought to be benign seen at [facility] pulmonology   Measles    h/o   Migraine    Obesity    Osteoarthritis    Preventative health care 02/03/2017   Raynaud's disease    S/P AVR (aortic valve replacement) 03/19/2015   Shoulder pain 06/03/2017   UTI (lower urinary tract infection)    UTI (urinary tract infection) 02/11/2016    Past Surgical History:  Procedure Laterality Date   ABDOMINAL HYSTERECTOMY     TAH SPO   AORTIC VALVE REPLACEMENT  2014   with [facility]   ATRIAL ABLATION SURGERY     BLADDER SUSPENSION     BREAST BIOPSY Right    Needle Biopsy   COLONOSCOPY WITH PROPOFOL N/A 12/28/2020   Procedure: COLONOSCOPY WITH PROPOFOL;  Surgeon: Virgel Manifold, MD;  Location: [facility] ENDOSCOPY;  Service: Endoscopy;  Laterality: N/A;   CORONARY ARTERY BYPASS GRAFT     DILATION AND CURETTAGE OF UTERUS     x 3   ESOPHAGOGASTRODUODENOSCOPY (EGD) WITH PROPOFOL N/A 12/28/2020  Procedure: ESOPHAGOGASTRODUODENOSCOPY (EGD) WITH PROPOFOL;  Surgeon: Virgel Manifold, MD;  Location: [facility] ENDOSCOPY;  Service: Endoscopy;  Laterality: N/A;   loop heart     SHOULDER SURGERY Left    arthroscopy for spurs   TONSILLECTOMY     TUBAL LIGATION      Family History  Problem Relation Age of Onset   Colon polyps Father    Alzheimer's disease Father    Alcohol abuse Father    Cancer Father        colon cancer   Arthritis Mother        s/p TKR   Neuropathy Mother    Hyperlipidemia Mother    Other Mother        familial mediterranean fever   Thyroid disease Mother    Hypertension Mother    Obesity Mother    Heart disease Mother    Cancer Brother        prostate cancer   Heart disease Brother        cardiomegaly   Other Brother        familial mediterranean fever   Asthma Maternal Grandmother    Congestive Heart Failure Maternal Grandmother    Heart disease Maternal Grandmother        chf   Stroke Maternal Grandfather     Diabetes Maternal Grandfather    Heart disease Maternal Grandfather        hardening of the arteries   Stroke Paternal Grandfather    Atrial fibrillation Paternal Grandfather    Alcohol abuse Paternal Grandfather    Heart disease Paternal Grandfather        afib   Interstitial cystitis Daughter    Arthritis Son    Alcohol abuse Son        in remission   Mental illness Son        depression   Other Son        interstitial cystitis   Colon cancer Other        parent   Arthritis Paternal Uncle     Social History   Socioeconomic History   Marital status: Married    Spouse name: Not on file   Number of children: 2   Years of education: Not on file   Highest education level: Bachelor's degree (e.g., BA, AB, BS)  Occupational History   Occupation: Best boy: [employer]  Tobacco Use   Smoking status: Never   Smokeless tobacco: Never  Substance and Sexual Activity   Alcohol use: No   Drug use: No   Sexual activity: Yes    Comment: lives with husband and adult son, no major dietary restrictions, full diability  Other Topics Concern   Not on file  Social History Narrative   Not on file   Social Determinants of Health   Financial Resource Strain: Low Risk    Difficulty of Paying Living Expenses: Not hard at all  Food Insecurity: No Food Insecurity   Worried About Charity fundraiser in the Last Year: Never true   [place] in the Last Year: Never true  Transportation Needs: No Transportation Needs   Lack of Transportation (Medical): No   Lack of Transportation (Non-Medical): No  Physical Activity: Inactive   Days of Exercise per Week: 0 days   Minutes of Exercise per Session: 0 min  Stress: No Stress Concern Present   Feeling of Stress : Only a little  Social Connections: Moderately Integrated  Frequency of Communication with Friends and Family: Three times a week   Frequency of Social Gatherings with Friends and Family: Once a  week   Attends Religious Services: More than 4 times per year   Active Member of [employer] or Organizations: No   Attends Music therapist: Never   Marital Status: Married  Human resources officer Violence: Not At Risk   Fear of Current or Ex-Partner: No   Emotionally Abused: No   Physically Abused: No   Sexually Abused: No    Outpatient Medications Prior to Visit  Medication Sig Dispense Refill   albuterol (PROAIR HFA) 108 (90 Base) MCG/ACT inhaler Inhale 2 puffs into the lungs every 6 (six) hours as needed for wheezing or shortness of breath. 1 each 0   amitriptyline (ELAVIL) 25 MG tablet Take 1 tablet (25 mg total) by mouth at bedtime.     busPIRone (BUSPAR) 10 MG tablet Take 1 tablet (10 mg total) by mouth 2 (two) times daily. 180 tablet 1   cyclobenzaprine (FLEXERIL) 5 MG tablet Take 5 mg by mouth 3 (three) times daily as needed for muscle spasms.     DULoxetine (CYMBALTA) 60 MG capsule TAKE TWO CAPSULES BY MOUTH EVERY MORNING 180 capsule 1   esomeprazole (NEXIUM) 40 MG capsule Take 40 mg by mouth 2 (two) times daily before a meal.      hydrOXYzine (ATARAX/VISTARIL) 25 MG tablet Take 1 tablet (25 mg total) by mouth at bedtime as needed. 90 tablet 1   levothyroxine (SYNTHROID) 125 MCG tablet TAKE ONE TABLET BY MOUTH ONCE WEEKLY BEFORE BREAKFAST ON MONDAY and TAKE ONE TABLET BY MOUTH ONCE WEEKLY BEFOR BREAKFAST ON  TUESDAYS and TAKE ONE TABLET BY MOUTH ONCE WEEKLY BEFORE BREAKFAST ON WEDNESDAYS and TAKE ONE TABLET BY MOUTH ONCE WEEKLY BEFORE BREAKFAST ON THURSDAYS and TAKE ONE TABLET BY MOUTH ONCE WEEKLY BEFORE BREAKFAST ON FRIDAYS and TAKE ONE TABLET BY MOUTH ONCE WEEKLY BEFORE BREAKFAST ON SATURDAY 78 tablet 1   losartan (COZAAR) 100 MG tablet TAKE ONE TABLET BY MOUTH EVERY MORNING 90 tablet 1   Magnesium 400 MG CAPS Take 400 mg by mouth daily. Reported on 02/13/2016     Methen-Hyosc-Meth Blue-Na Phos (UROGESIC-BLUE) 81.6 MG TABS Take 1 tablet (81.6 mg total) by mouth 4 (four) times  daily as needed.     metoprolol succinate (TOPROL-XL) 100 MG 24 hr tablet TAKE ONE TABLET BY MOUTH EVERY MORNING 90 tablet 1   metoprolol succinate (TOPROL-XL) 50 MG 24 hr tablet TAKE ONE TABLET BY MOUTH EVERY MORNING 90 tablet 1   Misc Natural Products (FIBER 7 PO) Take by mouth.     ondansetron (ZOFRAN) 4 MG tablet Take 4 mg by mouth every 8 (eight) hours as needed for nausea or vomiting.     Oxycodone HCl 10 MG TABS Take 1 tablet (10 mg total) by mouth 3 (three) times daily as needed. 90 tablet 0   rizatriptan (MAXALT) 10 MG tablet Take 10 mg by mouth as needed for migraine. May repeat in 2 hours if needed     zonisamide (ZONEGRAN) 100 MG capsule Take 200 mg by mouth 2 (two) times daily.     atorvastatin (LIPITOR) 40 MG tablet TAKE ONE TABLET BY MOUTH EVERYDAY AT BEDTIME 90 tablet 1   meloxicam (MOBIC) 15 MG tablet TAKE ONE TABLET BY MOUTH EVERY MORNING 90 tablet 1   HYDROcodone bit-homatropine (HYCODAN) 5-1.5 MG/5ML syrup Take 5 mLs by mouth every 8 (eight) hours as needed for cough. [place]  mL 0   No facility-administered medications prior to visit.    Allergies  Allergen Reactions   Ciprofloxacin Rash and Shortness Of Breath   Sulfa Antibiotics Hives   Sulfasalazine Hives   Cortisone Other (See Comments)    Red flush and anxiety   Tramadol Other (See Comments)    headache   Zonisamide Other (See Comments)    unk   Erythromycin Rash   Erythromycin Base Rash   Levofloxacin Nausea Only   Lisinopril     Other reaction(s): Cough (ALLERGY/intolerance)   Prednisone Rash and Anxiety    Review of Systems  Constitutional:  Negative for fever and malaise/fatigue.  HENT:  Negative for congestion, sinus pain and sore throat.   Eyes:  Negative for redness.  Respiratory:  Negative for cough and shortness of breath.   Cardiovascular:  Negative for chest pain, palpitations and leg swelling.  Gastrointestinal:  Negative for abdominal pain, blood in stool and nausea.  Genitourinary:  Negative  for dysuria and frequency.  Musculoskeletal:  Positive for back pain, joint pain (achilles heel) and myalgias. Negative for falls.       (+) leg pain  Skin:  Negative for rash.  Neurological:  Negative for dizziness, loss of consciousness and headaches.  Endo/Heme/Allergies:  Negative for polydipsia.  Psychiatric/Behavioral:  Negative for depression. The patient is not nervous/anxious.       Objective:    Physical Exam Constitutional:      General: She is not in acute distress.    Appearance: She is well-developed.  HENT:     Head: Normocephalic and atraumatic.  Eyes:     Conjunctiva/sclera: Conjunctivae normal.  Neck:     Thyroid: No thyromegaly.  Cardiovascular:     Rate and Rhythm: Normal rate and regular rhythm.     Heart sounds: Normal heart sounds. No murmur heard. Pulmonary:     Effort: Pulmonary effort is normal. No respiratory distress.     Breath sounds: Normal breath sounds.  Abdominal:     General: Bowel sounds are normal. There is no distension.     Palpations: Abdomen is soft. There is no mass.     Tenderness: There is no abdominal tenderness.  Musculoskeletal:     Cervical back: Neck supple.  Lymphadenopathy:     Cervical: No cervical adenopathy.  Skin:    General: Skin is warm and dry.  Neurological:     Mental Status: She is alert and oriented to person, place, and time.  Psychiatric:        Behavior: Behavior normal.    BP 110/72    Pulse 69    Temp 97.9 F (36.6 C)    Resp 16    Ht 5\' 8"  (1.727 m)    Wt 279 lb (126.6 kg)    SpO2 97%    BMI 42.42 kg/m  Wt Readings from Last 3 Encounters:  10/22/21 279 lb (126.6 kg)  09/26/21 276 lb (125.2 kg)  09/06/21 271 lb 6.4 oz (123.1 kg)    Diabetic Foot Exam - Simple   No data filed    Lab Results  Component Value Date   WBC 6.0 06/07/2021   HGB 13.2 06/07/2021   HCT 40.5 06/07/2021   PLT 180.0 06/07/2021   GLUCOSE 91 06/07/2021   CHOL 188 06/07/2021   TRIG 87.0 06/07/2021   HDL 73.20  06/07/2021   LDLDIRECT 103.0 06/03/2017   LDLCALC 97 06/07/2021   ALT 18 06/07/2021   AST 19 06/07/2021  NA 136 06/07/2021   K 4.8 06/07/2021   CL 101 06/07/2021   CREATININE 1.07 06/07/2021   BUN 25 (H) 06/07/2021   CO2 26 06/07/2021   TSH 0.22 (L) 06/07/2021   HGBA1C 5.6 06/07/2021    Lab Results  Component Value Date   TSH 0.22 (L) 06/07/2021   Lab Results  Component Value Date   WBC 6.0 06/07/2021   HGB 13.2 06/07/2021   HCT 40.5 06/07/2021   MCV 88.8 06/07/2021   PLT 180.0 06/07/2021   Lab Results  Component Value Date   NA 136 06/07/2021   K 4.8 06/07/2021   CO2 26 06/07/2021   GLUCOSE 91 06/07/2021   BUN 25 (H) 06/07/2021   CREATININE 1.07 06/07/2021   BILITOT 0.5 06/07/2021   ALKPHOS 85 06/07/2021   AST 19 06/07/2021   ALT 18 06/07/2021   PROT 6.5 06/07/2021   ALBUMIN 4.0 06/07/2021   CALCIUM 9.4 06/07/2021   GFR 54.28 (L) 06/07/2021   Lab Results  Component Value Date   CHOL 188 06/07/2021   Lab Results  Component Value Date   HDL 73.20 06/07/2021   Lab Results  Component Value Date   LDLCALC 97 06/07/2021   Lab Results  Component Value Date   TRIG 87.0 06/07/2021   Lab Results  Component Value Date   CHOLHDL 3 06/07/2021   Lab Results  Component Value Date   HGBA1C 5.6 06/07/2021       Assessment & Plan:   Problem List Items Addressed This Visit     Hyperlipidemia    Is having a significant increase in generalized pain consider a reaction to statins will hold medicine for a month and reevaluate. Encouraged to restart CoQ10 enzyme.       Relevant Orders   Lipid panel   Hypertension    Well controlled, no changes to meds. Encouraged heart healthy diet such as the DASH diet and exercise as tolerated.       Relevant Orders   CBC   Comprehensive metabolic panel   TSH   Class 2 severe obesity with serious comorbidity and body mass index (BMI) of 36.0 to 36.9 in adult, unspecified obesity type ([place])    Encouraged DASH or  MIND diet, decrease po intake and increase exercise as tolerated. Needs 7-8 hours of sleep nightly. Avoid trans fats, eat small, frequent meals every 4-5 hours with lean proteins, complex carbs and healthy fats. Minimize simple carbs, high fat foods and processed foods      Chronic low back pain with left-sided sciatica    Has seen neurology and ortho. Pain is flared. Will reevaluate if pain improves after holding statin. Continue current meds for now      Vitamin D deficiency    Supplement and monitor      Relevant Orders   VITAMIN D 25 Hydroxy (Vit-D Deficiency, Fractures)   Hyperglycemia    hgba1c acceptable, minimize simple carbs. Increase exercise as tolerated. Continue current meds      Relevant Orders   Hemoglobin A1c   Other Visit Diagnoses     High risk medication use    -  Primary   Myalgia       Relevant Orders   CK (Creatine Kinase)       No orders of the defined types were placed in this encounter.   I,Alexandra Patterson,acting as a Education administrator for Penni Homans, MD.,have documented all relevant documentation on the behalf of Penni Homans, MD,as directed by  Erline Levine  [name], MD while in the presence of Penni Homans, MD.   I, Alexandra Lukes, MD., personally preformed the services described in this documentation.  All medical record entries made by the scribe were at my direction and in my presence.  I have reviewed the chart and discharge instructions (if applicable) and agree that the record reflects my personal performance and is accurate and complete. 10/22/2021

## 2021-10-22 NOTE — Patient Instructions (Signed)
Take your CoQ daily for a couple of weeks Stop the Atorvastatin and let us know if your pain improves.    Muscle Pain, Adult Muscle pain, also called myalgia, is a condition in which a person has pain in one or more muscles in the body. Muscle pain may be mild, moderate, or severe. It may feel sharp, achy, or burning. In most cases, the pain lasts only a short time and goes away without treatment. Muscle pain can result from using muscles in a new or different way or after a period of inactivity. It is normal to feel some muscle pain after starting an exercise program. Muscles that have not been used often will be sore at first. What are the causes? This condition is caused by using muscles in a new or different way after a period of inactivity. Other causes may include: Overuse or muscle strain, especially if you are not in shape. This is the most common cause of muscle pain. Injury or bruising. Infectious diseases, including diseases caused by viruses, such as the flu (influenza). Fibromyalgia.This is a long-term, or chronic, condition that causes muscle tenderness, tiredness (fatigue), and headache. Autoimmune or rheumatologic diseases. These are conditions, such as lupus, in which the body's defense system (immunesystem) attacks areas in the body. Certain medicines, including ACE inhibitors and statins. What are the signs or symptoms? The main symptom of this condition is sore or painful muscles, including during activity and when stretching. You may also have slight swelling. How is this diagnosed? This condition is diagnosed with a physical exam. Your health care provider will ask questions about your pain and when it began. If you have not had muscle pain for very long, your health care provider may want to wait before doing much testing. If your muscle pain has lasted a long time, tests may be done right away. In some cases, this may include tests to rule out certain conditions or  illnesses. How is this treated? Treatment for this condition depends on the cause. Home care is often enough to relieve muscle pain. Your health care provider may also prescribe NSAIDs, such as ibuprofen. Follow these instructions at home: Medicines Take over-the-counter and prescription medicines only as told by your health care provider. Ask your health care provider if the medicine prescribed to you requires you to avoid driving or using machinery. Managing pain, swelling, and discomfort   If directed, put ice on the painful area. To do this: Put ice in a plastic bag. Place a towel between your skin and the bag. Leave the ice on for 20 minutes, 2-3 times a day. For the first 2 days of muscle soreness, or if there is swelling: Do not soak in hot baths. Do not use a hot tub, steam room, sauna, heating pad, or other heat source. After 48-72 hours, you may alternate between applying ice and applying heat as told by your health care provider. If directed, apply heat to the affected area as often as told by your health care provider. Use the heat source that your health care provider recommends, such as a moist heat pack or a heating pad. Place a towel between your skin and the heat source. Leave the heat on for 20-30 minutes. Remove the heat if your skin turns bright red. This is especially important if you are unable to feel pain, heat, or cold. You may have a greater risk of getting burned. If you have an injury, raise (elevate) the injured area above the level  of your heart while you are sitting or lying down. Activity  If overuse is causing your muscle pain: Slow down your activities until the pain goes away. Do regular, gentle exercises if you are not usually active. Warm up before exercising. Stretch before and after exercising. This can help lower the risk of muscle pain. Do not continue working out if the pain is severe. Severe pain could mean that you have injured a muscle. Do not  lift anything that is heavier than 5-10 lb (2.3-4.5 kg), or the limit that you are told, until your health care provider says that it is safe. Return to your normal activities as told by your health care provider. Ask your health care provider what activities are safe for you. General instructions Do not use any products that contain nicotine or tobacco, such as cigarettes, e-cigarettes, and chewing tobacco. These can delay healing. If you need help quitting, ask your health care provider. Keep all follow-up visits as told by your health care provider. This is important. Contact a health care provider if you have: Muscle pain that gets worse and medicines do not help. Muscle pain that lasts longer than 3 days. A rash or fever along with muscle pain. Muscle pain after a tick bite. Muscle pain while working out, even though you are in good physical condition. Redness, soreness, or swelling along with muscle pain. Muscle pain after starting a new medicine or changing the dose of a medicine. Get help right away if you have: Trouble breathing. Trouble swallowing. Muscle pain along with a stiff neck, fever, and vomiting. Severe muscle weakness or you cannot move part of your body. These symptoms may represent a serious problem that is an emergency. Do not wait to see if the symptoms will go away. Get medical help right away. Call your local emergency services (911 in the [place] Do not drive yourself to the hospital. Summary Muscle pain usually lasts only a short time and goes away without treatment. This condition is caused by using muscles in a new or different way after a period of inactivity. If your muscle pain lasts longer than 3 days, tell your health care provider. This information is not intended to replace advice given to you by your health care provider. Make sure you discuss any questions you have with your health care provider. Document Revised: 04/06/2021 Document Reviewed:  04/06/2021 Elsevier Patient Education  [place]. High Cholesterol High cholesterol is a condition in which the blood has high levels of a white, waxy substance similar to fat (cholesterol). The liver makes all the cholesterol that the body needs. The human body needs small amounts of cholesterol to help build cells. A person gets extra or excess cholesterol from the food that he or she eats. The blood carries cholesterol from the liver to the rest of the body. If you have high cholesterol, deposits (plaques) may build up on the walls of your arteries. Arteries are the blood vessels that carry blood away from your heart. These plaques make the arteries narrow and stiff. Cholesterol plaques increase your risk for heart attack and stroke. Work with your health care provider to keep your cholesterol levels in a healthy range. What increases the risk? The following factors may make you more likely to develop this condition: Eating foods that are high in animal fat (saturated fat) or cholesterol. Being overweight. Not getting enough exercise. A family history of high cholesterol (familial hypercholesterolemia). Use of tobacco products. Having diabetes. What are the  signs or symptoms? In most cases, high cholesterol does not usually cause any symptoms. In severe cases, very high cholesterol levels can cause: Fatty bumps under the skin (xanthomas). A white or gray ring around the black center (pupil) of the eye. How is this diagnosed? This condition may be diagnosed based on the results of a blood test. If you are older than 67 years of age, your health care provider may check your cholesterol levels every 4-6 years. You may be checked more often if you have high cholesterol or other risk factors for heart disease. The blood test for cholesterol measures: "Bad" cholesterol, or LDL cholesterol. This is the main type of cholesterol that causes heart disease. The desired level is less than 100  mg/dL (2.59 mmol/L). "Good" cholesterol, or HDL cholesterol. HDL helps protect against heart disease by cleaning the arteries and carrying the LDL to the liver for processing. The desired level for HDL is 60 mg/dL (1.55 mmol/L) or higher. Triglycerides. These are fats that your body can store or burn for energy. The desired level is less than 150 mg/dL (1.69 mmol/L). Total cholesterol. This measures the total amount of cholesterol in your blood and includes LDL, HDL, and triglycerides. The desired level is less than 200 mg/dL (5.17 mmol/L). How is this treated? Treatment for high cholesterol starts with lifestyle changes, such as diet and exercise. Diet changes. You may be asked to eat foods that have more fiber and less saturated fats or added sugar. Lifestyle changes. These may include regular exercise, maintaining a healthy weight, and quitting use of tobacco products. Medicines. These are given when diet and lifestyle changes have not worked. You may be prescribed a statin medicine to help lower your cholesterol levels. Follow these instructions at home: Eating and drinking  Eat a healthy, balanced diet. This diet includes: Daily servings of a variety of fresh, frozen, or canned fruits and vegetables. Daily servings of whole grain foods that are rich in fiber. Foods that are low in saturated fats and trans fats. These include poultry and fish without skin, lean cuts of meat, and low-fat dairy products. A variety of fish, especially oily fish that contain omega-3 fatty acids. Aim to eat fish at least 2 times a week. Avoid foods and drinks that have added sugar. Use healthy cooking methods, such as roasting, grilling, broiling, baking, poaching, steaming, and stir-frying. Do not fry your food except for stir-frying. If you drink alcohol: Limit how much you have to: 0-1 drink a day for women who are not pregnant. 0-2 drinks a day for men. Know how much alcohol is in a drink. In the [place] one  drink equals one 12 oz bottle of beer (355 mL), one 5 oz glass of wine (148 mL), or one 1 oz glass of hard liquor (44 mL). Lifestyle  Get regular exercise. Aim to exercise for a total of 150 minutes a week. Increase your activity level by doing activities such as gardening, walking, and taking the stairs. Do not use any products that contain nicotine or tobacco. These products include cigarettes, chewing tobacco, and vaping devices, such as e-cigarettes. If you need help quitting, ask your health care provider. General instructions Take over-the-counter and prescription medicines only as told by your health care provider. Keep all follow-up visits. This is important. Where to find more information American Heart Association: www.heart.org National Heart, Lung, and Blood Institute: https://wilson-eaton.com/ Contact a health care provider if: You have trouble achieving or maintaining a healthy diet or weight.  You are starting an exercise program. You are unable to stop smoking. Get help right away if: You have chest pain. You have trouble breathing. You have discomfort or pain in your jaw, neck, back, shoulder, or arm. You have any symptoms of a stroke. "BE FAST" is an easy way to remember the main warning signs of a stroke: B - Balance. Signs are dizziness, sudden trouble walking, or loss of balance. E - Eyes. Signs are trouble seeing or a sudden change in vision. F - Face. Signs are sudden weakness or numbness of the face, or the face or eyelid drooping on one side. A - Arms. Signs are weakness or numbness in an arm. This happens suddenly and usually on one side of the body. S - Speech. Signs are sudden trouble speaking, slurred speech, or trouble understanding what people say. T - Time. Time to call emergency services. Write down what time symptoms started. You have other signs of a stroke, such as: A sudden, severe headache with no known cause. Nausea or vomiting. Seizure. These symptoms may  represent a serious problem that is an emergency. Do not wait to see if the symptoms will go away. Get medical help right away. Call your local emergency services (911 in the [place] Do not drive yourself to the hospital. Summary Cholesterol plaques increase your risk for heart attack and stroke. Work with your health care provider to keep your cholesterol levels in a healthy range. Eat a healthy, balanced diet, get regular exercise, and maintain a healthy weight. Do not use any products that contain nicotine or tobacco. These products include cigarettes, chewing tobacco, and vaping devices, such as e-cigarettes. Get help right away if you have any symptoms of a stroke. This information is not intended to replace advice given to you by your health care provider. Make sure you discuss any questions you have with your health care provider. Document Revised: 11/30/2020 Document Reviewed: 11/20/2020 Elsevier Patient Education  2022 [employer].

## 2021-10-22 NOTE — Assessment & Plan Note (Signed)
Encouraged DASH or MIND diet, decrease po intake and increase exercise as tolerated. Needs 7-8 hours of sleep nightly. Avoid trans fats, eat small, frequent meals every 4-5 hours with lean proteins, complex carbs and healthy fats. Minimize simple carbs, high fat foods and processed foods 

## 2021-10-22 NOTE — Assessment & Plan Note (Addendum)
Has seen neurology and ortho. Pain is flared. Will reevaluate if pain improves after holding statin. Continue current meds for now

## 2021-10-22 NOTE — Assessment & Plan Note (Signed)
Supplement and monitor 

## 2021-10-22 NOTE — Assessment & Plan Note (Signed)
Is having a significant increase in generalized pain consider a reaction to statins will hold medicine for a month and reevaluate. Encouraged to restart CoQ10 enzyme.

## 2021-10-23 LAB — VITAMIN D 25 HYDROXY (VIT D DEFICIENCY, FRACTURES): VITD: 72.09 ng/mL (ref 30.00–100.00)

## 2021-10-23 LAB — HEMOGLOBIN A1C: Hgb A1c MFr Bld: 5.6 % (ref 4.6–6.5)

## 2021-10-23 LAB — LIPID PANEL
Cholesterol: 183 mg/dL (ref 0–200)
HDL: 67 mg/dL (ref >39.00)
LDL Cholesterol: 95 mg/dL (ref 0–99)
NonHDL: 116.24
Total CHOL/HDL Ratio: 3
Triglycerides: 105 mg/dL (ref 0.0–149.0)
VLDL: 21 mg/dL (ref 0.0–40.0)

## 2021-10-23 LAB — CBC
HCT: 39.2 % (ref 36.0–46.0)
Hemoglobin: 12.8 g/dL (ref 12.0–15.0)
MCHC: 32.6 g/dL (ref 30.0–36.0)
MCV: 87.5 fl (ref 78.0–100.0)
Platelets: 182 10*3/uL (ref 150.0–400.0)
RBC: 4.48 Mil/uL (ref 3.87–5.11)
RDW: 15.4 % (ref 11.5–15.5)
WBC: 6.7 10*3/uL (ref 4.0–10.5)

## 2021-10-23 LAB — COMPREHENSIVE METABOLIC PANEL
ALT: 13 U/L (ref 0–35)
AST: 14 U/L (ref 0–37)
Albumin: 3.9 g/dL (ref 3.5–5.2)
Alkaline Phosphatase: 108 U/L (ref 39–117)
BUN: 19 mg/dL (ref 6–23)
CO2: 29 mEq/L (ref 19–32)
Calcium: 9.2 mg/dL (ref 8.4–10.5)
Chloride: 102 mEq/L (ref 96–112)
Creatinine, Ser: 1.07 mg/dL (ref 0.40–1.20)
GFR: 54.13 mL/min — ABNORMAL LOW (ref >60.00)
Glucose, Bld: 89 mg/dL (ref 70–99)
Potassium: 4.5 mEq/L (ref 3.5–5.1)
Sodium: 138 mEq/L (ref 135–145)
Total Bilirubin: 0.5 mg/dL (ref 0.2–1.2)
Total Protein: 5.9 g/dL — ABNORMAL LOW (ref 6.0–8.3)

## 2021-10-23 LAB — CK: Total CK: 22 U/L (ref 7–177)

## 2021-10-23 LAB — TSH: TSH: 1.94 u[IU]/mL (ref 0.35–5.50)

## 2021-10-24 ENCOUNTER — Encounter: Payer: PPO | Attending: General Surgery | Admitting: Skilled Nursing Facility1

## 2021-10-24 ENCOUNTER — Other Ambulatory Visit: Payer: Self-pay

## 2021-10-24 DIAGNOSIS — Z6841 Body Mass Index (BMI) 40.0 and over, adult: Secondary | ICD-10-CM | POA: Diagnosis not present

## 2021-10-24 DIAGNOSIS — Z713 Dietary counseling and surveillance: Secondary | ICD-10-CM | POA: Diagnosis not present

## 2021-10-24 DIAGNOSIS — E669 Obesity, unspecified: Secondary | ICD-10-CM | POA: Insufficient documentation

## 2021-10-24 NOTE — Progress Notes (Signed)
Supervised Weight Loss Visit Bariatric Nutrition Education  SWL 2 of 6   NUTRITION ASSESSMENT  Anthropometrics  Start weight at [facility]: 273 lbs (date: 08/01/2021)  Weight: 279 lb  BMI: 42.56 kg/m2     Clinical  Medical hx: GERD, interstitial cystitis, insomnia, fibromyalgia  Medications: Zofran, levothyroxine, oxycodone, metoprolol, melxicam, buspirone, cyclobenzaprine, losartan, nexium Labs: total protein 5.9, GFR 54.13 Notable signs/symptoms: pain fibromyalgia  Any previous deficiencies? No  Lifestyle & Dietary Hx Pt sates she is struggling with achilles pain.   Pt arrives stating her achillis is causing her pain limiting her mobility. Pt states she was taken off her cholesterol sating they are  if it causes pain. 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 Pt states she has been going to bed earlier to help deal with her stress.  Pt states she really needs alone time.  Pt states her daughter is moving out.    Estimated daily fluid intake: unknown oz Supplements:  Current average weekly physical activity: ADL's  24-Hr Dietary Recall First Meal: 2 coffee + sf creamer  Snack: candy Second Meal: takeout chinese  Snack:  Third Meal: cheesecake  Snack: fiber gummies Beverages: coffee, diet tea, diet soda, water   Estimated Energy Needs Calories: 1600   NUTRITION DIAGNOSIS  Overweight/obesity ([place]-3.3) related to past poor dietary habits and physical inactivity as evidenced by patient w/ planned RYGB surgery following dietary guidelines for continued weight loss.   NUTRITION INTERVENTION  Nutrition counseling (C-1) and education (E-2) to facilitate bariatric surgery goals.  Pre-Op Goals Progress & New Goals Continue: Avoid all carbonated beverages (ex: soda, sparkling  beverages) Not able to accomplish   Continue: Limit caffeinated beverages (ex: coffee, tea, energy drinks) Not able to accomplish   Avoid all sugar-sweetened beverages (ex: regular soda, sports drinks) Not able to accomplish    Continue: Physical activity is an important part of a healthy lifestyle so keep it moving! The goal is to reach 150 minutes of exercise per week, including Not able to accomplish  cardiovascular and weight baring activity: youtube arm chair exercises NEW/continue: Not skipping breakfast and eating something small like yogurt. NEW/continue: Change mindset to focus on proper nutrition and making connections between eating and increased energy. NEW: perhaps blue tooth ear buds for your husbands so the TV doe snot bother you  Handouts Provided Include  N/A  Learning Style & Readiness for Change Teaching method utilized: Visual & Auditory  Demonstrated degree of understanding via: Teach Back  Readiness Level: pre-contemplative Barriers to learning/adherence to lifestyle change: Achilles pain and past dieting habits  RD's Notes for next Visit  Assess pts adherence to chosen goals    MONITORING & EVALUATION Dietary intake, weekly physical activity, body weight, and pre-op goals in 1 month.   Next Steps  Patient is to return to [facility] in 1 month

## 2021-11-07 DIAGNOSIS — F419 Anxiety disorder, unspecified: Secondary | ICD-10-CM | POA: Diagnosis not present

## 2021-11-07 DIAGNOSIS — I498 Other specified cardiac arrhythmias: Secondary | ICD-10-CM | POA: Diagnosis not present

## 2021-11-07 DIAGNOSIS — I4819 Other persistent atrial fibrillation: Secondary | ICD-10-CM | POA: Diagnosis not present

## 2021-11-07 DIAGNOSIS — I480 Paroxysmal atrial fibrillation: Secondary | ICD-10-CM | POA: Diagnosis not present

## 2021-11-07 DIAGNOSIS — Z952 Presence of prosthetic heart valve: Secondary | ICD-10-CM | POA: Diagnosis not present

## 2021-11-07 DIAGNOSIS — Z95818 Presence of other cardiac implants and grafts: Secondary | ICD-10-CM | POA: Diagnosis not present

## 2021-11-07 DIAGNOSIS — Q208 Other congenital malformations of cardiac chambers and connections: Secondary | ICD-10-CM | POA: Diagnosis not present

## 2021-11-07 DIAGNOSIS — Z4509 Encounter for adjustment and management of other cardiac device: Secondary | ICD-10-CM | POA: Diagnosis not present

## 2021-11-10 ENCOUNTER — Encounter: Payer: Self-pay | Admitting: Family Medicine

## 2021-11-12 NOTE — Telephone Encounter (Signed)
Lvm to see if she could schedule for Friday morning vv

## 2021-11-13 ENCOUNTER — Encounter: Payer: Self-pay | Admitting: Family Medicine

## 2021-11-13 NOTE — Telephone Encounter (Signed)
See other message

## 2021-11-16 ENCOUNTER — Telehealth: Payer: PPO | Admitting: Family Medicine

## 2021-11-20 ENCOUNTER — Telehealth: Payer: Self-pay | Admitting: Family Medicine

## 2021-11-21 MED ORDER — OXYCODONE HCL 10 MG PO TABS: 10.0000 mg | ORAL_TABLET | Freq: Three times a day (TID) | ORAL | 0 refills | Status: DC | PRN

## 2021-11-21 NOTE — Telephone Encounter (Signed)
Requesting: oxycodone Contract:N/A UDS:01/2020 Last Visit:10/22/21 Next Visit:01/22/22 Last Refill:10/16/21  Please Advise

## 2021-11-21 NOTE — Telephone Encounter (Signed)
PDMP okay, Rx sent 

## 2021-11-27 ENCOUNTER — Ambulatory Visit: Payer: PPO | Admitting: Skilled Nursing Facility1

## 2021-11-28 ENCOUNTER — Encounter: Payer: Self-pay | Admitting: Family Medicine

## 2021-12-17 DIAGNOSIS — M7662 Achilles tendinitis, left leg: Secondary | ICD-10-CM | POA: Diagnosis not present

## 2021-12-17 DIAGNOSIS — M79671 Pain in right foot: Secondary | ICD-10-CM | POA: Diagnosis not present

## 2021-12-17 DIAGNOSIS — M7661 Achilles tendinitis, right leg: Secondary | ICD-10-CM | POA: Diagnosis not present

## 2021-12-17 DIAGNOSIS — M79672 Pain in left foot: Secondary | ICD-10-CM | POA: Diagnosis not present

## 2021-12-19 ENCOUNTER — Ambulatory Visit: Payer: PPO | Admitting: Skilled Nursing Facility1

## 2021-12-24 ENCOUNTER — Other Ambulatory Visit: Payer: Self-pay | Admitting: Internal Medicine

## 2021-12-24 MED ORDER — OXYCODONE HCL 10 MG PO TABS: 10.0000 mg | ORAL_TABLET | Freq: Three times a day (TID) | ORAL | 0 refills | Status: DC | PRN

## 2021-12-24 NOTE — Telephone Encounter (Signed)
Requesting: oxycodone '10mg'$   ?Contract: 02/17/2020 ?UDS: 02/17/2020 ?Last Visit: 10/22/21 ?Next Visit: 01/22/2022 ?Last Refill: 11/21/2021 #90 and 0RF ? ?Please Advise ? ?

## 2022-01-02 DIAGNOSIS — R269 Unspecified abnormalities of gait and mobility: Secondary | ICD-10-CM | POA: Diagnosis not present

## 2022-01-02 DIAGNOSIS — M6289 Other specified disorders of muscle: Secondary | ICD-10-CM | POA: Diagnosis not present

## 2022-01-02 DIAGNOSIS — Z7409 Other reduced mobility: Secondary | ICD-10-CM | POA: Diagnosis not present

## 2022-01-02 DIAGNOSIS — M7661 Achilles tendinitis, right leg: Secondary | ICD-10-CM | POA: Diagnosis not present

## 2022-01-02 DIAGNOSIS — M7662 Achilles tendinitis, left leg: Secondary | ICD-10-CM | POA: Diagnosis not present

## 2022-01-02 DIAGNOSIS — Z789 Other specified health status: Secondary | ICD-10-CM | POA: Diagnosis not present

## 2022-01-12 ENCOUNTER — Other Ambulatory Visit: Payer: Self-pay | Admitting: Family Medicine

## 2022-01-12 DIAGNOSIS — E061 Subacute thyroiditis: Secondary | ICD-10-CM

## 2022-01-17 DIAGNOSIS — M7661 Achilles tendinitis, right leg: Secondary | ICD-10-CM | POA: Diagnosis not present

## 2022-01-17 DIAGNOSIS — R269 Unspecified abnormalities of gait and mobility: Secondary | ICD-10-CM | POA: Diagnosis not present

## 2022-01-17 DIAGNOSIS — Z789 Other specified health status: Secondary | ICD-10-CM | POA: Diagnosis not present

## 2022-01-17 DIAGNOSIS — Z7409 Other reduced mobility: Secondary | ICD-10-CM | POA: Diagnosis not present

## 2022-01-17 DIAGNOSIS — M7662 Achilles tendinitis, left leg: Secondary | ICD-10-CM | POA: Diagnosis not present

## 2022-01-21 NOTE — Progress Notes (Signed)
? ?Subjective:  ? ? Patient ID: Alexandra Patterson, female    DOB: Mar 06, 1956, 66 y.o.   MRN: [id number] ? ?Chief Complaint  ?Patient presents with  ? Follow-up  ? ? ?HPI ?Patient is in today for a 3 month follow up. She is struggling with excessive fatigue but denies any recent febrile illness or hospitalizations. She has seen cardiology recently and had an echo performed. They have not found any concerning changes. She finds she is not able to complete all of her activities and ADLs without resting. Denies CP/palp/SOB/HA/congestion/fevers/GI or GU c/o. Taking meds as prescribed  ? ?Past Medical History:  ?Diagnosis Date  ? Allergy   ? Anxiety   ? Aortic stenosis   ? Atrial fibrillation ([place])   ? Back pain   ? Bilateral swelling of feet   ? Bursitis   ? Cervical cancer screening 09/07/2015  ? Menarche at 11 Irregular and heavy and painful flow secondary to fibroids No history of abnormal pap in past, last pap roughly 2003 G2P2, s/p 2 SVD history of abnormal MGM, 1 abnl bx right breast benign, normal otherwise Noconcerns today TAH b/l SPO for fibroids and migrainesand breast bx on right gyn surgeries  ? Constipation   ? Cough 08/13/2015  ? Depression   ? Depression with anxiety 06/29/2016  ? Fainting   ? once to due heart out of rythm  ? Fibromyalgia   ? Frequent headaches   ? GERD (gastroesophageal reflux disease)   ? Hair loss disorder 06/03/2017  ? Heartburn   ? History of blood clots   ? History of chicken pox   ? Hx of blood clots   ? dvt  ? Hyperlipidemia   ? Hyperplastic colon polyp   ? Hypertension   ? Hypothyroid   ? Insomnia 08/13/2015  ? Internal hemorrhoids   ? Interstitial cystitis   ? Joint pain   ? Lesion of lung   ? xray in 2014 thought to be benign seen at [facility]  ? Measles   ? h/o  ? Migraine   ? Obesity   ? Osteoarthritis   ? Preventative health care 02/03/2017  ? Raynaud's disease   ? S/P AVR (aortic valve replacement) 03/19/2015  ? Shoulder pain 06/03/2017  ? UTI (lower urinary tract infection)    ? UTI (urinary tract infection) 02/11/2016  ? ? ?Past Surgical History:  ?Procedure Laterality Date  ? ABDOMINAL HYSTERECTOMY    ? TAH SPO  ? AORTIC VALVE REPLACEMENT  2014  ? with [place]  ? ATRIAL ABLATION SURGERY    ? BLADDER SUSPENSION    ? BREAST BIOPSY Right   ? Needle Biopsy  ? COLONOSCOPY WITH PROPOFOL N/A 12/28/2020  ? Procedure: COLONOSCOPY WITH PROPOFOL;  Surgeon: Virgel Manifold, MD;  Location: [facility] ENDOSCOPY;  Service: Endoscopy;  Laterality: N/A;  ? CORONARY ARTERY BYPASS GRAFT    ? DILATION AND CURETTAGE OF UTERUS    ? x 3  ? ESOPHAGOGASTRODUODENOSCOPY (EGD) WITH PROPOFOL N/A 12/28/2020  ? Procedure: ESOPHAGOGASTRODUODENOSCOPY (EGD) WITH PROPOFOL;  Surgeon: Virgel Manifold, MD;  Location: [facility] ENDOSCOPY;  Service: Endoscopy;  Laterality: N/A;  ? loop heart    ? SHOULDER SURGERY Left   ? arthroscopy for spurs  ? TONSILLECTOMY    ? TUBAL LIGATION    ? ? ?Family History  ?Problem Relation Age of Onset  ? Colon polyps Father   ? Alzheimer's disease Father   ? Alcohol abuse Father   ? Cancer Father   ?  colon cancer  ? Arthritis Mother   ?     s/p TKR  ? Neuropathy Mother   ? Hyperlipidemia Mother   ? Other Mother   ?     familial mediterranean fever  ? Thyroid disease Mother   ? Hypertension Mother   ? Obesity Mother   ? Heart disease Mother   ? Cancer Brother   ?     prostate cancer  ? Heart disease Brother   ?     cardiomegaly  ? Other Brother   ?     familial mediterranean fever  ? Asthma Maternal Grandmother   ? Congestive Heart Failure Maternal Grandmother   ? Heart disease Maternal Grandmother   ?     chf  ? Stroke Maternal Grandfather   ? Diabetes Maternal Grandfather   ? Heart disease Maternal Grandfather   ?     hardening of the arteries  ? Stroke Paternal Grandfather   ? Atrial fibrillation Paternal Grandfather   ? Alcohol abuse Paternal Grandfather   ? Heart disease Paternal Grandfather   ?     afib  ? Interstitial cystitis Daughter   ? Arthritis Son   ? Alcohol abuse Son   ?     in  remission  ? Mental illness Son   ?     depression  ? Other Son   ?     interstitial cystitis  ? Colon cancer Other   ?     parent  ? Arthritis Paternal Uncle   ? ? ?Social History  ? ?Socioeconomic History  ? Marital status: Married  ?  Spouse name: Not on file  ? Number of children: 2  ? Years of education: Not on file  ? Highest education level: Bachelor's degree (e.g., BA, AB, BS)  ?Occupational History  ? Occupation: Company secretary  ?  Employer: [employer]  ?Tobacco Use  ? Smoking status: Never  ? Smokeless tobacco: Never  ?Substance and Sexual Activity  ? Alcohol use: No  ? Drug use: No  ? Sexual activity: Yes  ?  Comment: lives with husband and adult son, no major dietary restrictions, full diability  ?Other Topics Concern  ? Not on file  ?Social History Narrative  ? Not on file  ? ?Social Determinants of Health  ? ?Financial Resource Strain: Low Risk   ? Difficulty of Paying Living Expenses: Not hard at all  ?Food Insecurity: No Food Insecurity  ? Worried About Charity fundraiser in the Last Year: Never true  ? Ran Out of Food in the Last Year: Never true  ?Transportation Needs: No Transportation Needs  ? Lack of Transportation (Medical): No  ? Lack of Transportation (Non-Medical): No  ?Physical Activity: Inactive  ? Days of Exercise per Week: 0 days  ? Minutes of Exercise per Session: 0 min  ?Stress: No Stress Concern Present  ? Feeling of Stress : Only a little  ?Social Connections: Moderately Integrated  ? Frequency of Communication with Friends and Family: Three times a week  ? Frequency of Social Gatherings with Friends and Family: Once a week  ? Attends Religious Services: More than 4 times per year  ? Active Member of Clubs or Organizations: No  ? Attends Archivist Meetings: Never  ? Marital Status: Married  ?Intimate Partner Violence: Not At Risk  ? Fear of Current or Ex-Partner: No  ? Emotionally Abused: No  ? Physically Abused: No  ? Sexually Abused: No  ? ? ?  Outpatient  Medications Prior to Visit  ?Medication Sig Dispense Refill  ? albuterol (PROAIR HFA) 108 (90 Base) MCG/ACT inhaler Inhale 2 puffs into the lungs every 6 (six) hours as needed for wheezing or shortness of breath. 1 each 0  ? amitriptyline (ELAVIL) 25 MG tablet Take 1 tablet (25 mg total) by mouth at bedtime.    ? busPIRone (BUSPAR) 10 MG tablet TAKE ONE TABLET BY MOUTH EVERY MORNING and TAKE ONE TABLET BY MOUTH EVERYDAY AT BEDTIME 180 tablet 1  ? cyclobenzaprine (FLEXERIL) 5 MG tablet Take 5 mg by mouth 3 (three) times daily as needed for muscle spasms.    ? DULoxetine (CYMBALTA) 60 MG capsule TAKE TWO CAPSULES BY MOUTH EVERY MORNING 180 capsule 1  ? esomeprazole (NEXIUM) 40 MG capsule Take 40 mg by mouth 2 (two) times daily before a meal.     ? hydrOXYzine (ATARAX/VISTARIL) 25 MG tablet Take 1 tablet (25 mg total) by mouth at bedtime as needed. 90 tablet 1  ? levothyroxine (SYNTHROID) 125 MCG tablet TAKE ONE TABLET BY MOUTH monday-saturday before breakfast 78 tablet 1  ? losartan (COZAAR) 100 MG tablet TAKE ONE TABLET BY MOUTH EVERY MORNING 90 tablet 1  ? Magnesium 400 MG CAPS Take 400 mg by mouth daily. Reported on 02/13/2016    ? Methen-Hyosc-Meth Blue-Na Phos (UROGESIC-BLUE) 81.6 MG TABS Take 1 tablet (81.6 mg total) by mouth 4 (four) times daily as needed.    ? metoprolol succinate (TOPROL-XL) 100 MG 24 hr tablet TAKE ONE TABLET BY MOUTH EVERY MORNING 90 tablet 1  ? metoprolol succinate (TOPROL-XL) 50 MG 24 hr tablet TAKE ONE TABLET BY MOUTH EVERY MORNING 90 tablet 1  ? ondansetron (ZOFRAN) 4 MG tablet Take 4 mg by mouth every 8 (eight) hours as needed for nausea or vomiting.    ? Oxycodone HCl 10 MG TABS Take 1 tablet (10 mg total) by mouth 3 (three) times daily as needed. 90 tablet 0  ? rizatriptan (MAXALT) 10 MG tablet Take 10 mg by mouth as needed for migraine. May repeat in 2 hours if needed    ? zonisamide (ZONEGRAN) 100 MG capsule Take 200 mg by mouth 2 (two) times daily.    ? Misc Natural Products (FIBER  7 PO) Take by mouth.    ? ?No facility-administered medications prior to visit.  ? ? ?Allergies  ?Allergen Reactions  ? Ciprofloxacin Rash and Shortness Of Breath  ? Sulfa Antibiotics Hives  ? Sulfasalazine

## 2022-01-22 ENCOUNTER — Ambulatory Visit (INDEPENDENT_AMBULATORY_CARE_PROVIDER_SITE_OTHER): Payer: PPO | Admitting: Family Medicine

## 2022-01-22 ENCOUNTER — Encounter: Payer: Self-pay | Admitting: Family Medicine

## 2022-01-22 VITALS — BP 128/70 | HR 83 | Resp 20 | Ht 68.0 in | Wt 274.0 lb

## 2022-01-22 DIAGNOSIS — E559 Vitamin D deficiency, unspecified: Secondary | ICD-10-CM | POA: Diagnosis not present

## 2022-01-22 DIAGNOSIS — M79672 Pain in left foot: Secondary | ICD-10-CM

## 2022-01-22 DIAGNOSIS — E785 Hyperlipidemia, unspecified: Secondary | ICD-10-CM | POA: Diagnosis not present

## 2022-01-22 DIAGNOSIS — R5383 Other fatigue: Secondary | ICD-10-CM | POA: Diagnosis not present

## 2022-01-22 DIAGNOSIS — M79671 Pain in right foot: Secondary | ICD-10-CM

## 2022-01-22 DIAGNOSIS — R739 Hyperglycemia, unspecified: Secondary | ICD-10-CM

## 2022-01-22 DIAGNOSIS — G4700 Insomnia, unspecified: Secondary | ICD-10-CM

## 2022-01-22 DIAGNOSIS — Z6836 Body mass index (BMI) 36.0-36.9, adult: Secondary | ICD-10-CM

## 2022-01-22 DIAGNOSIS — E061 Subacute thyroiditis: Secondary | ICD-10-CM | POA: Diagnosis not present

## 2022-01-22 DIAGNOSIS — I10 Essential (primary) hypertension: Secondary | ICD-10-CM | POA: Diagnosis not present

## 2022-01-22 MED ORDER — WEGOVY 0.25 MG/0.5ML ~~LOC~~ SOAJ: 0.2500 mg | SUBCUTANEOUS | 3 refills | Status: DC

## 2022-01-22 NOTE — Patient Instructions (Addendum)
Encouraged increased hydration and fiber in diet. Daily probiotics. If bowels not moving can use MOM 2 tbls po in 4 oz of warm prune juice by mouth every 2-3 days. If no results then repeat in 4 hours with  Dulcolax suppository pr, may repeat again in 4 more hours as needed. Seek care if symptoms worsen. Consider daily Miralax and/or Dulcolax if symptoms persist.   ? ?Constipation, Adult ?Constipation is when a person has fewer than three bowel movements in a week, has difficulty having a bowel movement, or has stools (feces) that are dry, hard, or larger than normal. Constipation may be caused by an underlying condition. It may become worse with age if a person takes certain medicines and does not take in enough fluids. ?Follow these instructions at home: ?Eating and drinking ? ?Eat foods that have a lot of fiber, such as beans, whole grains, and fresh fruits and vegetables. ?Limit foods that are low in fiber and high in fat and processed sugars, such as fried or sweet foods. These include french fries, hamburgers, cookies, candies, and soda. ?Drink enough fluid to keep your urine pale yellow. ?General instructions ?Exercise regularly or as told by your health care provider. Try to do 150 minutes of moderate exercise each week. ?Use the bathroom when you have the urge to go. Do not hold it in. ?Take over-the-counter and prescription medicines only as told by your health care provider. This includes any fiber supplements. ?During bowel movements: ?Practice deep breathing while relaxing the lower abdomen. ?Practice pelvic floor relaxation. ?Watch your condition for any changes. Let your health care provider know about them. ?Keep all follow-up visits as told by your health care provider. This is important. ?Contact a health care provider if: ?You have pain that gets worse. ?You have a fever. ?You do not have a bowel movement after 4 days. ?You vomit. ?You are not hungry or you lose weight. ?You are bleeding from the  opening between the buttocks (anus). ?You have thin, pencil-like stools. ?Get help right away if: ?You have a fever and your symptoms suddenly get worse. ?You leak stool or have blood in your stool. ?Your abdomen is bloated. ?You have severe pain in your abdomen. ?You feel dizzy or you faint. ?Summary ?Constipation is when a person has fewer than three bowel movements in a week, has difficulty having a bowel movement, or has stools (feces) that are dry, hard, or larger than normal. ?Eat foods that have a lot of fiber, such as beans, whole grains, and fresh fruits and vegetables. ?Drink enough fluid to keep your urine pale yellow. ?Take over-the-counter and prescription medicines only as told by your health care provider. This includes any fiber supplements. ?This information is not intended to replace advice given to you by your health care provider. Make sure you discuss any questions you have with your health care provider. ?Document Revised: 08/04/2019 Document Reviewed: 08/04/2019 ?Elsevier Patient Education ? [place]. ? ? ?Miralax and Benefiber once to twice even three x daily to keep the bowels moving comfortably ? ?D Mannose for Interstitial Cystitis  ?

## 2022-01-23 ENCOUNTER — Encounter: Payer: Self-pay | Admitting: Family Medicine

## 2022-01-23 DIAGNOSIS — M79671 Pain in right foot: Secondary | ICD-10-CM | POA: Insufficient documentation

## 2022-01-23 DIAGNOSIS — R7989 Other specified abnormal findings of blood chemistry: Secondary | ICD-10-CM

## 2022-01-23 LAB — CBC
HCT: 40 % (ref 36.0–46.0)
Hemoglobin: 12.8 g/dL (ref 12.0–15.0)
MCHC: 32.1 g/dL (ref 30.0–36.0)
MCV: 87.5 fl (ref 78.0–100.0)
Platelets: 191 10*3/uL (ref 150.0–400.0)
RBC: 4.57 Mil/uL (ref 3.87–5.11)
RDW: 15.3 % (ref 11.5–15.5)
WBC: 7.2 10*3/uL (ref 4.0–10.5)

## 2022-01-23 LAB — COMPREHENSIVE METABOLIC PANEL
ALT: 11 U/L (ref 0–35)
AST: 13 U/L (ref 0–37)
Albumin: 4.2 g/dL (ref 3.5–5.2)
Alkaline Phosphatase: 101 U/L (ref 39–117)
BUN: 28 mg/dL — ABNORMAL HIGH (ref 6–23)
CO2: 26 mEq/L (ref 19–32)
Calcium: 9.1 mg/dL (ref 8.4–10.5)
Chloride: 101 mEq/L (ref 96–112)
Creatinine, Ser: 1.1 mg/dL (ref 0.40–1.20)
GFR: 52.27 mL/min — ABNORMAL LOW (ref >60.00)
Glucose, Bld: 99 mg/dL (ref 70–99)
Potassium: 4.8 mEq/L (ref 3.5–5.1)
Sodium: 136 mEq/L (ref 135–145)
Total Bilirubin: 0.5 mg/dL (ref 0.2–1.2)
Total Protein: 6.1 g/dL (ref 6.0–8.3)

## 2022-01-23 LAB — T4, FREE: Free T4: 0.87 ng/dL (ref 0.60–1.60)

## 2022-01-23 LAB — TSH: TSH: 1.85 u[IU]/mL (ref 0.35–5.50)

## 2022-01-23 LAB — THYROID PEROXIDASE ANTIBODIES (TPO) (REFL): Thyroperoxidase Ab SerPl-aCnc: 19 IU/mL — ABNORMAL HIGH (ref <9)

## 2022-01-23 LAB — LIPID PANEL
Cholesterol: 271 mg/dL — ABNORMAL HIGH (ref 0–200)
HDL: 75.5 mg/dL (ref >39.00)
LDL Cholesterol: 175 mg/dL — ABNORMAL HIGH (ref 0–99)
NonHDL: 195.52
Total CHOL/HDL Ratio: 4
Triglycerides: 102 mg/dL (ref 0.0–149.0)
VLDL: 20.4 mg/dL (ref 0.0–40.0)

## 2022-01-23 LAB — T3, FREE: T3, Free: 2.8 pg/mL (ref 2.3–4.2)

## 2022-01-23 LAB — SEDIMENTATION RATE: Sed Rate: 11 mm/hr (ref 0–30)

## 2022-01-23 LAB — HEMOGLOBIN A1C: Hgb A1c MFr Bld: 5.7 % (ref 4.6–6.5)

## 2022-01-23 LAB — VITAMIN D 25 HYDROXY (VIT D DEFICIENCY, FRACTURES): VITD: 67.73 ng/mL (ref 30.00–100.00)

## 2022-01-23 MED ORDER — ROSUVASTATIN CALCIUM 5 MG PO TABS: 5.0000 mg | ORAL_TABLET | Freq: Every evening | ORAL | 3 refills | Status: DC

## 2022-01-23 NOTE — Assessment & Plan Note (Signed)
>>  ASSESSMENT AND PLAN FOR CLASS 2 SEVERE OBESITY WITH SERIOUS COMORBIDITY AND BODY MASS INDEX (BMI) OF 36.0 TO 36.9 IN ADULT, UNSPECIFIED OBESITY TYPE (HCC) WRITTEN ON 01/23/2022 10:50 PM BY BLYTH, STACEY A, MD  Encouraged DASH or MIND diet, decrease po intake and increase exercise as tolerated. Needs 7-8 hours of sleep nightly. Avoid trans fats, eat small, frequent meals every 4-5 hours with lean proteins, complex carbs and healthy fats. Minimize simple carbs, high fat foods and processed foods. Her insurance will not cover Wegovy  but she is going to check pricing and consider the medication. She is given a prescription for the lowest dose to start with

## 2022-01-23 NOTE — Assessment & Plan Note (Signed)
Follow up with podiatry, ice, stretch, good arch supports. ?

## 2022-01-23 NOTE — Assessment & Plan Note (Signed)
hgba1c acceptable, minimize simple carbs. Increase exercise as tolerated.  

## 2022-01-23 NOTE — Assessment & Plan Note (Signed)
Is worse lately. Thyroid studies largely normal but TPO is elevated some. She will consider referral to endocrinology for consideration ?

## 2022-01-23 NOTE — Assessment & Plan Note (Signed)
Encouraged good sleep hygiene such as dark, quiet room. No blue/green glowing lights such as computer screens in bedroom. No alcohol or stimulants in evening. Cut down on caffeine as able. Regular exercise is helpful but not just prior to bed time.  

## 2022-01-23 NOTE — Assessment & Plan Note (Signed)
Encourage heart healthy diet such as MIND or DASH diet, increase exercise, avoid trans fats, simple carbohydrates and processed foods, consider a krill or fish or flaxseed oil cap daily. Numbers are up encouraged Rosuvastatin 5 mg qhs ?

## 2022-01-23 NOTE — Assessment & Plan Note (Signed)
Encouraged DASH or MIND diet, decrease po intake and increase exercise as tolerated. Needs 7-8 hours of sleep nightly. Avoid trans fats, eat small, frequent meals every 4-5 hours with lean proteins, complex carbs and healthy fats. Minimize simple carbs, high fat foods and processed foods. Her insurance will not cover [phone] but she is going to check pricing and consider the medication. She is given a prescription for the lowest dose to start with ?

## 2022-01-23 NOTE — Assessment & Plan Note (Signed)
Elevated TPO consider referral to endo ?

## 2022-01-23 NOTE — Assessment & Plan Note (Signed)
Supplement and monitor 

## 2022-01-23 NOTE — Assessment & Plan Note (Signed)
Well controlled, no changes to meds. Encouraged heart healthy diet such as the DASH diet and exercise as tolerated.  °

## 2022-01-24 ENCOUNTER — Other Ambulatory Visit: Payer: Self-pay

## 2022-01-24 DIAGNOSIS — R7989 Other specified abnormal findings of blood chemistry: Secondary | ICD-10-CM

## 2022-01-29 ENCOUNTER — Encounter: Payer: Self-pay | Admitting: Family Medicine

## 2022-01-30 ENCOUNTER — Encounter: Payer: Self-pay | Admitting: Family Medicine

## 2022-01-30 DIAGNOSIS — Z79899 Other long term (current) drug therapy: Secondary | ICD-10-CM | POA: Diagnosis not present

## 2022-01-30 DIAGNOSIS — G43909 Migraine, unspecified, not intractable, without status migrainosus: Secondary | ICD-10-CM | POA: Diagnosis not present

## 2022-01-30 DIAGNOSIS — R200 Anesthesia of skin: Secondary | ICD-10-CM | POA: Diagnosis not present

## 2022-01-30 DIAGNOSIS — M545 Low back pain, unspecified: Secondary | ICD-10-CM | POA: Diagnosis not present

## 2022-02-11 ENCOUNTER — Other Ambulatory Visit: Payer: Self-pay | Admitting: Family Medicine

## 2022-02-11 ENCOUNTER — Encounter: Payer: Self-pay | Admitting: Family Medicine

## 2022-02-11 MED ORDER — OXYCODONE HCL 10 MG PO TABS: 10.0000 mg | ORAL_TABLET | Freq: Three times a day (TID) | ORAL | 0 refills | Status: DC | PRN

## 2022-02-11 NOTE — Telephone Encounter (Signed)
Requesting: oxycodone ?Contract:  ?UDS: 02/17/20 ?Last Visit: 01/22/22 ?Next Visit: 05/28/22 ?Last Refill: 12/24/21 ? ?Please Advise ? ?

## 2022-03-01 ENCOUNTER — Encounter: Payer: Self-pay | Admitting: Family Medicine

## 2022-03-01 DIAGNOSIS — R5383 Other fatigue: Secondary | ICD-10-CM

## 2022-03-01 NOTE — Telephone Encounter (Signed)
Labs ordered and lab appointment made for 6/6

## 2022-03-04 DIAGNOSIS — Z7901 Long term (current) use of anticoagulants: Secondary | ICD-10-CM | POA: Diagnosis not present

## 2022-03-04 DIAGNOSIS — M797 Fibromyalgia: Secondary | ICD-10-CM | POA: Diagnosis not present

## 2022-03-04 DIAGNOSIS — I480 Paroxysmal atrial fibrillation: Secondary | ICD-10-CM | POA: Diagnosis not present

## 2022-03-04 DIAGNOSIS — Z4509 Encounter for adjustment and management of other cardiac device: Secondary | ICD-10-CM | POA: Diagnosis not present

## 2022-03-04 DIAGNOSIS — I350 Nonrheumatic aortic (valve) stenosis: Secondary | ICD-10-CM | POA: Diagnosis not present

## 2022-03-05 ENCOUNTER — Other Ambulatory Visit (INDEPENDENT_AMBULATORY_CARE_PROVIDER_SITE_OTHER): Payer: PPO

## 2022-03-05 DIAGNOSIS — R5383 Other fatigue: Secondary | ICD-10-CM

## 2022-03-05 LAB — CBC WITH DIFFERENTIAL/PLATELET
Basophils Absolute: 0.1 10*3/uL (ref 0.0–0.1)
Basophils Relative: 1.2 % (ref 0.0–3.0)
Eosinophils Absolute: 0.2 10*3/uL (ref 0.0–0.7)
Eosinophils Relative: 3.2 % (ref 0.0–5.0)
HCT: 37.4 % (ref 36.0–46.0)
Hemoglobin: 12.1 g/dL (ref 12.0–15.0)
Lymphocytes Relative: 18.8 % (ref 12.0–46.0)
Lymphs Abs: 1.2 10*3/uL (ref 0.7–4.0)
MCHC: 32.3 g/dL (ref 30.0–36.0)
MCV: 86.4 fl (ref 78.0–100.0)
Monocytes Absolute: 0.4 10*3/uL (ref 0.1–1.0)
Monocytes Relative: 5.9 % (ref 3.0–12.0)
Neutro Abs: 4.4 10*3/uL (ref 1.4–7.7)
Neutrophils Relative %: 70.9 % (ref 43.0–77.0)
Platelets: 167 10*3/uL (ref 150.0–400.0)
RBC: 4.33 Mil/uL (ref 3.87–5.11)
RDW: 15.9 % — ABNORMAL HIGH (ref 11.5–15.5)
WBC: 6.2 10*3/uL (ref 4.0–10.5)

## 2022-03-05 LAB — COMPREHENSIVE METABOLIC PANEL
ALT: 10 U/L (ref 0–35)
AST: 12 U/L (ref 0–37)
Albumin: 3.9 g/dL (ref 3.5–5.2)
Alkaline Phosphatase: 103 U/L (ref 39–117)
BUN: 19 mg/dL (ref 6–23)
CO2: 26 mEq/L (ref 19–32)
Calcium: 9.1 mg/dL (ref 8.4–10.5)
Chloride: 103 mEq/L (ref 96–112)
Creatinine, Ser: 1.08 mg/dL (ref 0.40–1.20)
GFR: 53.39 mL/min — ABNORMAL LOW (ref >60.00)
Glucose, Bld: 90 mg/dL (ref 70–99)
Potassium: 4.8 mEq/L (ref 3.5–5.1)
Sodium: 138 mEq/L (ref 135–145)
Total Bilirubin: 0.4 mg/dL (ref 0.2–1.2)
Total Protein: 5.9 g/dL — ABNORMAL LOW (ref 6.0–8.3)

## 2022-03-05 LAB — TSH: TSH: 1.65 u[IU]/mL (ref 0.35–5.50)

## 2022-03-05 LAB — T4, FREE: Free T4: 0.8 ng/dL (ref 0.60–1.60)

## 2022-03-05 LAB — T3, FREE: T3, Free: 3 pg/mL (ref 2.3–4.2)

## 2022-03-06 LAB — THYROID PEROXIDASE ANTIBODIES (TPO) (REFL): Thyroperoxidase Ab SerPl-aCnc: 17 IU/mL — ABNORMAL HIGH (ref <9)

## 2022-03-26 ENCOUNTER — Other Ambulatory Visit: Payer: Self-pay | Admitting: Family Medicine

## 2022-03-26 MED ORDER — OXYCODONE HCL 10 MG PO TABS: 10.0000 mg | ORAL_TABLET | Freq: Three times a day (TID) | ORAL | 0 refills | Status: DC | PRN

## 2022-03-28 DIAGNOSIS — E038 Other specified hypothyroidism: Secondary | ICD-10-CM | POA: Diagnosis not present

## 2022-03-28 DIAGNOSIS — R635 Abnormal weight gain: Secondary | ICD-10-CM | POA: Diagnosis not present

## 2022-03-28 DIAGNOSIS — N951 Menopausal and female climacteric states: Secondary | ICD-10-CM | POA: Diagnosis not present

## 2022-03-28 DIAGNOSIS — D509 Iron deficiency anemia, unspecified: Secondary | ICD-10-CM | POA: Diagnosis not present

## 2022-03-28 DIAGNOSIS — Z131 Encounter for screening for diabetes mellitus: Secondary | ICD-10-CM | POA: Diagnosis not present

## 2022-03-28 DIAGNOSIS — Z6841 Body Mass Index (BMI) 40.0 and over, adult: Secondary | ICD-10-CM | POA: Diagnosis not present

## 2022-03-28 DIAGNOSIS — E782 Mixed hyperlipidemia: Secondary | ICD-10-CM | POA: Diagnosis not present

## 2022-04-01 DIAGNOSIS — R232 Flushing: Secondary | ICD-10-CM | POA: Diagnosis not present

## 2022-04-01 DIAGNOSIS — Z1339 Encounter for screening examination for other mental health and behavioral disorders: Secondary | ICD-10-CM | POA: Diagnosis not present

## 2022-04-01 DIAGNOSIS — E782 Mixed hyperlipidemia: Secondary | ICD-10-CM | POA: Diagnosis not present

## 2022-04-01 DIAGNOSIS — R6882 Decreased libido: Secondary | ICD-10-CM | POA: Diagnosis not present

## 2022-04-01 DIAGNOSIS — E038 Other specified hypothyroidism: Secondary | ICD-10-CM | POA: Diagnosis not present

## 2022-04-01 DIAGNOSIS — N951 Menopausal and female climacteric states: Secondary | ICD-10-CM | POA: Diagnosis not present

## 2022-04-01 DIAGNOSIS — N3010 Interstitial cystitis (chronic) without hematuria: Secondary | ICD-10-CM | POA: Diagnosis not present

## 2022-04-01 DIAGNOSIS — I10 Essential (primary) hypertension: Secondary | ICD-10-CM | POA: Diagnosis not present

## 2022-04-01 DIAGNOSIS — Z1331 Encounter for screening for depression: Secondary | ICD-10-CM | POA: Diagnosis not present

## 2022-04-01 DIAGNOSIS — R635 Abnormal weight gain: Secondary | ICD-10-CM | POA: Diagnosis not present

## 2022-04-01 DIAGNOSIS — Z6841 Body Mass Index (BMI) 40.0 and over, adult: Secondary | ICD-10-CM | POA: Diagnosis not present

## 2022-04-07 ENCOUNTER — Encounter: Payer: Self-pay | Admitting: Family Medicine

## 2022-04-07 ENCOUNTER — Other Ambulatory Visit: Payer: Self-pay | Admitting: Family Medicine

## 2022-04-15 DIAGNOSIS — Z6841 Body Mass Index (BMI) 40.0 and over, adult: Secondary | ICD-10-CM | POA: Diagnosis not present

## 2022-04-15 DIAGNOSIS — I10 Essential (primary) hypertension: Secondary | ICD-10-CM | POA: Diagnosis not present

## 2022-04-16 DIAGNOSIS — R338 Other retention of urine: Secondary | ICD-10-CM | POA: Diagnosis not present

## 2022-04-16 DIAGNOSIS — N3010 Interstitial cystitis (chronic) without hematuria: Secondary | ICD-10-CM | POA: Diagnosis not present

## 2022-04-22 DIAGNOSIS — E038 Other specified hypothyroidism: Secondary | ICD-10-CM | POA: Diagnosis not present

## 2022-04-22 DIAGNOSIS — Z6841 Body Mass Index (BMI) 40.0 and over, adult: Secondary | ICD-10-CM | POA: Diagnosis not present

## 2022-04-30 ENCOUNTER — Ambulatory Visit (INDEPENDENT_AMBULATORY_CARE_PROVIDER_SITE_OTHER): Payer: PPO

## 2022-04-30 VITALS — BP 118/76 | HR 87 | Temp 98.2°F | Resp 16 | Ht 68.0 in | Wt 268.4 lb

## 2022-04-30 DIAGNOSIS — Z780 Asymptomatic menopausal state: Secondary | ICD-10-CM | POA: Diagnosis not present

## 2022-04-30 DIAGNOSIS — Z0000 Encounter for general adult medical examination without abnormal findings: Secondary | ICD-10-CM | POA: Diagnosis not present

## 2022-04-30 DIAGNOSIS — Z1231 Encounter for screening mammogram for malignant neoplasm of breast: Secondary | ICD-10-CM

## 2022-04-30 NOTE — Patient Instructions (Signed)
Alexandra Patterson , Thank you for taking time to come for your Medicare Wellness Visit. I appreciate your ongoing commitment to your health goals. Please review the following plan we discussed and let me know if I can assist you in the future.   Screening recommendations/referrals: Colonoscopy: 12/28/20 due 12/29/23 Mammogram: 09/12/20 due 09/12/22 Bone Density: ordered 04/30/22  Recommended yearly ophthalmology/optometry visit for glaucoma screening and checkup Recommended yearly dental visit for hygiene and checkup  Vaccinations: Influenza vaccine: up to date Pneumococcal vaccine: Due-May obtain vaccine at our office or your local pharmacy.  Tdap vaccine: up to date Shingles vaccine: up to date   Covid-19:completed  Advanced directives: no  Conditions/risks identified: see problem list   Next appointment: Follow up in one year for your annual wellness visit    Preventive Care 67 Years and Older, Female Preventive care refers to lifestyle choices and visits with your health care provider that can promote health and wellness. What does preventive care include? A yearly physical exam. This is also called an annual well check. Dental exams once or twice a year. Routine eye exams. Ask your health care provider how often you should have your eyes checked. Personal lifestyle choices, including: Daily care of your teeth and gums. Regular physical activity. Eating a healthy diet. Avoiding tobacco and drug use. Limiting alcohol use. Practicing safe sex. Taking low-dose aspirin every day. Taking vitamin and mineral supplements as recommended by your health care provider. What happens during an annual well check? The services and screenings done by your health care provider during your annual well check will depend on your age, overall health, lifestyle risk factors, and family history of disease. Counseling  Your health care provider may ask you questions about your: Alcohol use. Tobacco  use. Drug use. Emotional well-being. Home and relationship well-being. Sexual activity. Eating habits. History of falls. Memory and ability to understand (cognition). Work and work Statistician. Reproductive health. Screening  You may have the following tests or measurements: Height, weight, and BMI. Blood pressure. Lipid and cholesterol levels. These may be checked every 5 years, or more frequently if you are over 74 years old. Skin check. Lung cancer screening. You may have this screening every year starting at age 41 if you have a 30-pack-year history of smoking and currently smoke or have quit within the past 15 years. Fecal occult blood test (FOBT) of the stool. You may have this test every year starting at age 33. Flexible sigmoidoscopy or colonoscopy. You may have a sigmoidoscopy every 5 years or a colonoscopy every 10 years starting at age 25. Hepatitis C blood test. Hepatitis B blood test. Sexually transmitted disease (STD) testing. Diabetes screening. This is done by checking your blood sugar (glucose) after you have not eaten for a while (fasting). You may have this done every 1-3 years. Bone density scan. This is done to screen for osteoporosis. You may have this done starting at age 34. Mammogram. This may be done every 1-2 years. Talk to your health care provider about how often you should have regular mammograms. Talk with your health care provider about your test results, treatment options, and if necessary, the need for more tests. Vaccines  Your health care provider may recommend certain vaccines, such as: Influenza vaccine. This is recommended every year. Tetanus, diphtheria, and acellular pertussis (Tdap, Td) vaccine. You may need a Td booster every 10 years. Zoster vaccine. You may need this after age 75. Pneumococcal 13-valent conjugate (PCV13) vaccine. One dose is recommended after  age 45. Pneumococcal polysaccharide (PPSV23) vaccine. One dose is recommended  after age 60. Talk to your health care provider about which screenings and vaccines you need and how often you need them. This information is not intended to replace advice given to you by your health care provider. Make sure you discuss any questions you have with your health care provider. Document Released: 10/13/2015 Document Revised: 06/05/2016 Document Reviewed: 07/18/2015 Elsevier Interactive Patient Education  2017 [place] Prevention in the Home Falls can cause injuries. They can happen to people of all ages. There are many things you can do to make your home safe and to help prevent falls. What can I do on the outside of my home? Regularly fix the edges of walkways and driveways and fix any cracks. Remove anything that might make you trip as you walk through a door, such as a raised step or threshold. Trim any bushes or trees on the path to your home. Use bright outdoor lighting. Clear any walking paths of anything that might make someone trip, such as rocks or tools. Regularly check to see if handrails are loose or broken. Make sure that both sides of any steps have handrails. Any raised decks and porches should have guardrails on the edges. Have any leaves, snow, or ice cleared regularly. Use sand or salt on walking paths during winter. Clean up any spills in your garage right away. This includes oil or grease spills. What can I do in the bathroom? Use night lights. Install grab bars by the toilet and in the tub and shower. Do not use towel bars as grab bars. Use non-skid mats or decals in the tub or shower. If you need to sit down in the shower, use a plastic, non-slip stool. Keep the floor dry. Clean up any water that spills on the floor as soon as it happens. Remove soap buildup in the tub or shower regularly. Attach bath mats securely with double-sided non-slip rug tape. Do not have throw rugs and other things on the floor that can make you trip. What can I do  in the bedroom? Use night lights. Make sure that you have a light by your bed that is easy to reach. Do not use any sheets or blankets that are too big for your bed. They should not hang down onto the floor. Have a firm chair that has side arms. You can use this for support while you get dressed. Do not have throw rugs and other things on the floor that can make you trip. What can I do in the kitchen? Clean up any spills right away. Avoid walking on wet floors. Keep items that you use a lot in easy-to-reach places. If you need to reach something above you, use a strong step stool that has a grab bar. Keep electrical cords out of the way. Do not use floor polish or wax that makes floors slippery. If you must use wax, use non-skid floor wax. Do not have throw rugs and other things on the floor that can make you trip. What can I do with my stairs? Do not leave any items on the stairs. Make sure that there are handrails on both sides of the stairs and use them. Fix handrails that are broken or loose. Make sure that handrails are as long as the stairways. Check any carpeting to make sure that it is firmly attached to the stairs. Fix any carpet that is loose or worn. Avoid having throw rugs  at the top or bottom of the stairs. If you do have throw rugs, attach them to the floor with carpet tape. Make sure that you have a light switch at the top of the stairs and the bottom of the stairs. If you do not have them, ask someone to add them for you. What else can I do to help prevent falls? Wear shoes that: Do not have high heels. Have rubber bottoms. Are comfortable and fit you well. Are closed at the toe. Do not wear sandals. If you use a stepladder: Make sure that it is fully opened. Do not climb a closed stepladder. Make sure that both sides of the stepladder are locked into place. Ask someone to hold it for you, if possible. Clearly mark and make sure that you can see: Any grab bars or  handrails. First and last steps. Where the edge of each step is. Use tools that help you move around (mobility aids) if they are needed. These include: Canes. Walkers. Scooters. Crutches. Turn on the lights when you go into a dark area. Replace any light bulbs as soon as they burn out. Set up your furniture so you have a clear path. Avoid moving your furniture around. If any of your floors are uneven, fix them. If there are any pets around you, be aware of where they are. Review your medicines with your doctor. Some medicines can make you feel dizzy. This can increase your chance of falling. Ask your doctor what other things that you can do to help prevent falls. This information is not intended to replace advice given to you by your health care provider. Make sure you discuss any questions you have with your health care provider. Document Released: 07/13/2009 Document Revised: 02/22/2016 Document Reviewed: 10/21/2014 Elsevier Interactive Patient Education  2017 [employer].

## 2022-04-30 NOTE — Progress Notes (Signed)
Subjective:   Alexandra Patterson is a 66 y.o. female who presents for Medicare Annual (Subsequent) preventive examination.  Review of Systems     Cardiac Risk Factors include: advanced age (>1mn, >>60women);obesity (BMI >30kg/m2);hypertension;dyslipidemia     Objective:    Today's Vitals   04/30/22 1254  BP: 118/76  Pulse: 87  Resp: 16  Temp: 98.2 F (36.8 C)  SpO2: 100%  Weight: 268 lb 6.4 oz (121.7 kg)  Height: '5\' 8"'$  (1.727 m)   Body mass index is 40.81 kg/m.     04/30/2022    1:01 PM 08/01/2021    9:59 AM 04/24/2021   10:57 AM 12/28/2020    7:46 AM 01/14/2019    3:21 PM  Advanced Directives  Does Patient Have a Medical Advance Directive? No No No No No  Would patient like information on creating a medical advance directive? No - Patient declined No - Patient declined No - Patient declined No - Patient declined No - Patient declined    Current Medications (verified) Outpatient Encounter Medications as of 04/30/2022  Medication Sig   amitriptyline (ELAVIL) 25 MG tablet Take 1 tablet (25 mg total) by mouth at bedtime.   busPIRone (BUSPAR) 10 MG tablet TAKE ONE TABLET BY MOUTH EVERY MORNING and TAKE ONE TABLET BY MOUTH EVERYDAY AT BEDTIME   cyclobenzaprine (FLEXERIL) 5 MG tablet Take 5 mg by mouth 3 (three) times daily as needed for muscle spasms.   DULoxetine (CYMBALTA) 60 MG capsule TAKE TWO CAPSULES BY MOUTH EVERY MORNING   esomeprazole (NEXIUM) 40 MG capsule Take 40 mg by mouth 2 (two) times daily before a meal.    hydrOXYzine (ATARAX/VISTARIL) 25 MG tablet Take 1 tablet (25 mg total) by mouth at bedtime as needed.   levothyroxine (SYNTHROID) 125 MCG tablet TAKE ONE TABLET BY MOUTH monday-saturday before breakfast   losartan (COZAAR) 100 MG tablet TAKE ONE TABLET BY MOUTH EVERY MORNING   Magnesium 400 MG CAPS Take 400 mg by mouth daily. Reported on 02/13/2016   meloxicam (MOBIC) 15 MG tablet TAKE ONE TABLET BY MOUTH EVERY MORNING   Methen-Hyosc-Meth Blue-Na Phos  (UROGESIC-BLUE) 81.6 MG TABS Take 1 tablet (81.6 mg total) by mouth 4 (four) times daily as needed.   metoprolol succinate (TOPROL-XL) 100 MG 24 hr tablet TAKE ONE TABLET BY MOUTH EVERY MORNING   metoprolol succinate (TOPROL-XL) 50 MG 24 hr tablet TAKE ONE TABLET BY MOUTH EVERY MORNING   ondansetron (ZOFRAN) 4 MG tablet Take 4 mg by mouth every 8 (eight) hours as needed for nausea or vomiting.   Oxycodone HCl 10 MG TABS Take 1 tablet (10 mg total) by mouth 3 (three) times daily as needed.   rosuvastatin (CRESTOR) 5 MG tablet Take 1 tablet (5 mg total) by mouth at bedtime.   zonisamide (ZONEGRAN) 100 MG capsule Take 200 mg by mouth 2 (two) times daily.   [DISCONTINUED] albuterol (PROAIR HFA) 108 (90 Base) MCG/ACT inhaler Inhale 2 puffs into the lungs every 6 (six) hours as needed for wheezing or shortness of breath.   [DISCONTINUED] rizatriptan (MAXALT) 10 MG tablet Take 10 mg by mouth as needed for migraine. May repeat in 2 hours if needed   [DISCONTINUED] Semaglutide-Weight Management (WEGOVY) 0.25 MG/0.5ML SOAJ Inject 0.25 mg into the skin once a week.   No facility-administered encounter medications on file as of 04/30/2022.    Allergies (verified) Ciprofloxacin, Sulfa antibiotics, Sulfasalazine, Cortisone, Tramadol, Zonisamide, Erythromycin, Erythromycin base, Levofloxacin, Lisinopril, and Prednisone   History: Past Medical  History:  Diagnosis Date   Allergy    Anxiety    Aortic stenosis    Atrial fibrillation (HCC)    Back pain    Bilateral swelling of feet    Bursitis    Cervical cancer screening 09/07/2015   Menarche at 11 Irregular and heavy and painful flow secondary to fibroids No history of abnormal pap in past, last pap roughly 2003 G2P2, s/p 2 SVD history of abnormal MGM, 1 abnl bx right breast benign, normal otherwise Noconcerns today TAH b/l SPO for fibroids and migrainesand breast bx on right gyn surgeries   Constipation    Cough 08/13/2015   Depression    Depression with  anxiety 06/29/2016   Fainting    once to due heart out of rythm   Fibromyalgia    Frequent headaches    GERD (gastroesophageal reflux disease)    Hair loss disorder 06/03/2017   Heartburn    History of blood clots    History of chicken pox    Hx of blood clots    dvt   Hyperlipidemia    Hyperplastic colon polyp    Hypertension    Hypothyroid    Insomnia 08/13/2015   Internal hemorrhoids    Interstitial cystitis    Joint pain    Lesion of lung    xray in 2014 thought to be benign seen at [facility] pulmonology   Measles    h/o   Migraine    Obesity    Osteoarthritis    Preventative health care 02/03/2017   Raynaud's disease    S/P AVR (aortic valve replacement) 03/19/2015   Shoulder pain 06/03/2017   UTI (lower urinary tract infection)    UTI (urinary tract infection) 02/11/2016   Past Surgical History:  Procedure Laterality Date   ABDOMINAL HYSTERECTOMY     TAH SPO   AORTIC VALVE REPLACEMENT  2014   with [facility]   ATRIAL ABLATION SURGERY     BLADDER SUSPENSION     BREAST BIOPSY Right    Needle Biopsy   COLONOSCOPY WITH PROPOFOL N/A 12/28/2020   Procedure: COLONOSCOPY WITH PROPOFOL;  Surgeon: Virgel Manifold, MD;  Location: [facility] ENDOSCOPY;  Service: Endoscopy;  Laterality: N/A;   CORONARY ARTERY BYPASS GRAFT     DILATION AND CURETTAGE OF UTERUS     x 3   ESOPHAGOGASTRODUODENOSCOPY (EGD) WITH PROPOFOL N/A 12/28/2020   Procedure: ESOPHAGOGASTRODUODENOSCOPY (EGD) WITH PROPOFOL;  Surgeon: Virgel Manifold, MD;  Location: [facility] ENDOSCOPY;  Service: Endoscopy;  Laterality: N/A;   loop heart     SHOULDER SURGERY Left    arthroscopy for spurs   TONSILLECTOMY     TUBAL LIGATION     Family History  Problem Relation Age of Onset   Colon polyps Father    Alzheimer's disease Father    Alcohol abuse Father    Cancer Father        colon cancer   Arthritis Mother        s/p TKR   Neuropathy Mother    Hyperlipidemia Mother    Other Mother        familial mediterranean  fever   Thyroid disease Mother    Hypertension Mother    Obesity Mother    Heart disease Mother    Cancer Brother        prostate cancer   Heart disease Brother        cardiomegaly   Other Brother        familial mediterranean fever  Asthma Maternal Grandmother    Congestive Heart Failure Maternal Grandmother    Heart disease Maternal Grandmother        chf   Stroke Maternal Grandfather    Diabetes Maternal Grandfather    Heart disease Maternal Grandfather        hardening of the arteries   Stroke Paternal Grandfather    Atrial fibrillation Paternal Grandfather    Alcohol abuse Paternal Grandfather    Heart disease Paternal Grandfather        afib   Interstitial cystitis Daughter    Arthritis Son    Alcohol abuse Son        in remission   Mental illness Son        depression   Other Son        interstitial cystitis   Colon cancer Other        parent   Arthritis Paternal Uncle    Social History   Socioeconomic History   Marital status: Married    Spouse name: Not on file   Number of children: 2   Years of education: Not on file   Highest education level: Bachelor's degree (e.g., BA, AB, BS)  Occupational History   Occupation: Best boy: [employer]  Tobacco Use   Smoking status: Never   Smokeless tobacco: Never  Substance and Sexual Activity   Alcohol use: No   Drug use: No   Sexual activity: Yes    Comment: lives with husband and adult son, no major dietary restrictions, full diability  Other Topics Concern   Not on file  Social History Narrative   Not on file   Social Determinants of Health   Financial Resource Strain: Low Risk  (07/18/2021)   Overall Financial Resource Strain (CARDIA)    Difficulty of Paying Living Expenses: Not hard at all  Food Insecurity: No Food Insecurity (07/18/2021)   Hunger Vital Sign    Worried About Running Out of Food in the Last Year: Never true    [place] in the Last Year: Never true   Transportation Needs: No Transportation Needs (07/18/2021)   PRAPARE - Hydrologist (Medical): No    Lack of Transportation (Non-Medical): No  Physical Activity: Unknown (07/18/2021)   Exercise Vital Sign    Days of Exercise per Week: 0 days    Minutes of Exercise per Session: Not on file  Recent Concern: Physical Activity - Inactive (07/18/2021)   Exercise Vital Sign    Days of Exercise per Week: 0 days    Minutes of Exercise per Session: 0 min  Stress: No Stress Concern Present (07/18/2021)   [place]    Feeling of Stress : Only a little  Social Connections: Moderately Integrated (07/18/2021)   Social Connection and Isolation Panel [NHANES]    Frequency of Communication with Friends and Family: Three times a week    Frequency of Social Gatherings with Friends and Family: Once a week    Attends Religious Services: More than 4 times per year    Active Member of [employer] or Organizations: No    Attends Music therapist: Not on file    Marital Status: Married    Tobacco Counseling Counseling given: Not Answered   Clinical Intake:  Pre-visit preparation completed: Yes  Pain : No/denies pain     BMI - recorded: 40.81 Nutritional Status: BMI > 30  Obese Nutritional Risks: None Diabetes: No  How often do you need to have someone help you when you read instructions, pamphlets, or other written materials from your doctor or pharmacy?: 1 - Never  Diabetic?no  Interpreter Needed?: No  Information entered by :: [place] [facility]    04/30/2022    1:08 PM  In your present state of health, do you have any difficulty performing the following activities:  Hearing? 0  Vision? 0  Difficulty concentrating or making decisions? 0  Walking or climbing stairs? 1  Dressing or bathing? 0  Doing errands, shopping? 0  Preparing Food and eating ? N   Using the Toilet? N  In the past six months, have you accidently leaked urine? Y  Do you have problems with loss of bowel control? N  Managing your Medications? N  Managing your Finances? N  Housekeeping or managing your Housekeeping? N    Patient Care Team: [name], MD as PCP - General (Family Medicine) Veneda Melter, MD as Referring Physician (Cardiology) Dudley Major, MD as Referring Physician (Internal Medicine) Lunette Stands Glori Bickers, MD as Referring Physician (Cardiothoracic Surgery) Kerin Perna., MD as Referring Physician (Neurology)  Indicate any recent Medical Services you may have received from other than [facility] providers in the past year (date may be approximate).     Assessment:   This is a routine wellness examination for Carnisha.  Hearing/Vision screen No results found.  Dietary issues and exercise activities discussed: Current Exercise Habits: The patient does not participate in regular exercise at present, Exercise limited by: orthopedic condition(s)   Goals Addressed             This Visit's Progress    DIET - INCREASE WATER INTAKE   Not on track    Exercise 150 min/wk Moderate Activity   Not on track      Depression Screen    04/30/2022    1:03 PM 01/22/2022    3:57 PM 08/01/2021    9:59 AM 07/18/2021    3:02 PM 06/07/2021    4:24 PM 04/24/2021   11:00 AM 03/01/2021    3:31 PM  PHQ 2/9 Scores  PHQ - 2 Score 3 0 0 0 0 1 2  PHQ- 9 Score '7 5   7  11    '$ Fall Risk    04/30/2022    1:02 PM 01/22/2022    3:57 PM 08/01/2021    9:59 AM 07/18/2021    3:02 PM 04/24/2021   10:58 AM  Fall Risk   Falls in the past year? 0 0 0 0 0  Number falls in past yr: 0 0  0 0  Injury with Fall? 0 0  0 0  Risk for fall due to : No Fall Risks No Fall Risks   History of fall(s)  Follow up Falls evaluation completed Falls evaluation completed  Falls evaluation completed Falls prevention discussed    FALL RISK PREVENTION PERTAINING TO THE HOME:  Any  stairs in or around the home? No  If so, are there any without handrails?  N/a Home free of loose throw rugs in walkways, pet beds, electrical cords, etc? Yes  Adequate lighting in your home to reduce risk of falls? Yes   ASSISTIVE DEVICES UTILIZED TO PREVENT FALLS:  Life alert? No  Use of a cane, walker or w/c? No  Grab bars in the bathroom? No  Shower chair or bench in shower? No  Elevated toilet seat or a handicapped toilet? Yes   TIMED UP AND GO:  Was the test performed? Yes .  Length of time to ambulate 10 feet: 10 sec.   Gait steady and fast without use of assistive device  Cognitive Function:        04/30/2022    1:13 PM  6CIT Screen  What Year? 0 points  What month? 0 points  What time? 0 points  Count back from 20 0 points  Months in reverse 0 points  Repeat phrase 0 points  Total Score 0 points    Immunizations Immunization History  Administered Date(s) Administered   Fluad Quad(high Dose 65+) 07/27/2020, 06/07/2021   Influenza,inj,Quad PF,6+ Mos 07/29/2019   PFIZER(Purple Top)SARS-COV-2 Vaccination 12/23/2019, 01/17/2020, 11/27/2020, 06/26/2021   Pneumococcal Polysaccharide-23 07/27/2020   Tdap 05/26/2015   Zoster Recombinat (Shingrix) 05/29/2018, 08/01/2018   Zoster, Live 05/26/2015    TDAP status: Up to date  Flu Vaccine status: Up to date  Pneumococcal vaccine status: Due, Education has been provided regarding the importance of this vaccine. Advised may receive this vaccine at local pharmacy or Health Dept. Aware to provide a copy of the vaccination record if obtained from local pharmacy or Health Dept. Verbalized acceptance and understanding.  Covid-19 vaccine status: Completed vaccines  Qualifies for Shingles Vaccine? Yes   Zostavax completed No   Shingrix Completed?: Yes  Screening Tests Health Maintenance  Topic Date Due   Pneumonia Vaccine 33+ Years old (2 - PCV) 07/27/2021   COVID-19 Vaccine (5 - Booster for Pfizer series) 08/21/2021    INFLUENZA VACCINE  04/30/2022   MAMMOGRAM  09/12/2022   COLONOSCOPY (Pts 45-34yr Insurance coverage will need to be confirmed)  12/29/2023   TETANUS/TDAP  05/25/2025   DEXA SCAN  Completed   Zoster Vaccines- Shingrix  Completed   HPV VACCINES  Aged Out   Hepatitis C Screening  Discontinued    Health Maintenance  Health Maintenance Due  Topic Date Due   Pneumonia Vaccine 67 Years old (2 - PCV) 07/27/2021   COVID-19 Vaccine (5 - Booster for Pfizer series) 08/21/2021   INFLUENZA VACCINE  04/30/2022    Colorectal cancer screening: Type of screening: Colonoscopy. Completed 12/28/20. Repeat every 3 years  Mammogram status: Completed 09/12/20. Repeat every year every 2 years   Bone Density status: Ordered 04/30/22. Pt provided with contact info and advised to call to schedule appt.  Lung Cancer Screening: (Low Dose CT Chest recommended if Age 67-80years, 30 pack-year currently smoking OR have quit w/in 15years.) does not qualify.   Lung Cancer Screening Referral: n/a  Additional Screening:  Hepatitis C Screening: does qualify; Completed not yet  Vision Screening: Recommended annual ophthalmology exams for early detection of glaucoma and other disorders of the eye. Is the patient up to date with their annual eye exam?  No  Who is the provider or what is the name of the office in which the patient attends annual eye exams? N/a If pt is not established with a provider, would they like to be referred to a provider to establish care? No .   Dental Screening: Recommended annual dental exams for proper oral hygiene  Community Resource Referral / Chronic Care Management: CRR required this visit?  No   CCM required this visit?  No      Plan:     I have personally reviewed and noted the following in the patient's chart:   Medical and social history Use of alcohol, tobacco or illicit  drugs  Current medications and supplements including opioid prescriptions.  Functional ability  and status Nutritional status Physical activity Advanced directives List of other physicians Hospitalizations, surgeries, and ER visits in previous 12 months Vitals Screenings to include cognitive, depression, and falls Referrals and appointments  In addition, I have reviewed and discussed with patient certain preventive protocols, quality metrics, and best practice recommendations. A written personalized care plan for preventive services as well as general preventive health recommendations were provided to patient.     Duard Brady Aadyn Buchheit, [place]   04/30/2022   Nurse Notes: none

## 2022-05-07 DIAGNOSIS — N951 Menopausal and female climacteric states: Secondary | ICD-10-CM | POA: Diagnosis not present

## 2022-05-07 DIAGNOSIS — R232 Flushing: Secondary | ICD-10-CM | POA: Diagnosis not present

## 2022-05-07 DIAGNOSIS — R6882 Decreased libido: Secondary | ICD-10-CM | POA: Diagnosis not present

## 2022-05-07 DIAGNOSIS — Z6841 Body Mass Index (BMI) 40.0 and over, adult: Secondary | ICD-10-CM | POA: Diagnosis not present

## 2022-05-08 ENCOUNTER — Encounter (INDEPENDENT_AMBULATORY_CARE_PROVIDER_SITE_OTHER): Payer: Self-pay

## 2022-05-13 ENCOUNTER — Other Ambulatory Visit: Payer: Self-pay | Admitting: Family Medicine

## 2022-05-16 DIAGNOSIS — Z6841 Body Mass Index (BMI) 40.0 and over, adult: Secondary | ICD-10-CM | POA: Diagnosis not present

## 2022-05-16 DIAGNOSIS — I10 Essential (primary) hypertension: Secondary | ICD-10-CM | POA: Diagnosis not present

## 2022-05-27 DIAGNOSIS — R82998 Other abnormal findings in urine: Secondary | ICD-10-CM | POA: Diagnosis not present

## 2022-05-27 DIAGNOSIS — R338 Other retention of urine: Secondary | ICD-10-CM | POA: Diagnosis not present

## 2022-05-28 ENCOUNTER — Ambulatory Visit (INDEPENDENT_AMBULATORY_CARE_PROVIDER_SITE_OTHER): Payer: PPO | Admitting: Family Medicine

## 2022-05-28 VITALS — BP 120/72 | HR 89 | Temp 98.0°F | Resp 16 | Ht 68.0 in | Wt 261.8 lb

## 2022-05-28 DIAGNOSIS — N3010 Interstitial cystitis (chronic) without hematuria: Secondary | ICD-10-CM | POA: Diagnosis not present

## 2022-05-28 DIAGNOSIS — I4891 Unspecified atrial fibrillation: Secondary | ICD-10-CM

## 2022-05-28 DIAGNOSIS — Z6836 Body mass index (BMI) 36.0-36.9, adult: Secondary | ICD-10-CM

## 2022-05-28 DIAGNOSIS — M797 Fibromyalgia: Secondary | ICD-10-CM

## 2022-05-28 DIAGNOSIS — Z23 Encounter for immunization: Secondary | ICD-10-CM

## 2022-05-28 DIAGNOSIS — I10 Essential (primary) hypertension: Secondary | ICD-10-CM

## 2022-05-28 DIAGNOSIS — E061 Subacute thyroiditis: Secondary | ICD-10-CM

## 2022-05-28 DIAGNOSIS — Z79899 Other long term (current) drug therapy: Secondary | ICD-10-CM

## 2022-05-28 DIAGNOSIS — R739 Hyperglycemia, unspecified: Secondary | ICD-10-CM | POA: Diagnosis not present

## 2022-05-28 DIAGNOSIS — E785 Hyperlipidemia, unspecified: Secondary | ICD-10-CM

## 2022-05-28 DIAGNOSIS — G43809 Other migraine, not intractable, without status migrainosus: Secondary | ICD-10-CM

## 2022-05-28 DIAGNOSIS — E559 Vitamin D deficiency, unspecified: Secondary | ICD-10-CM

## 2022-05-28 NOTE — Assessment & Plan Note (Signed)
Stable on current meds no changes 

## 2022-05-28 NOTE — Assessment & Plan Note (Signed)
Encourage heart healthy diet such as MIND or DASH diet, increase exercise, avoid trans fats, simple carbohydrates and processed foods, consider a krill or fish or flaxseed oil cap daily.  °

## 2022-05-28 NOTE — Assessment & Plan Note (Signed)
Improving with weight loss and testosterone treatment from [facility] MD

## 2022-05-28 NOTE — Assessment & Plan Note (Signed)
Well controlled, no changes to meds. Encouraged heart healthy diet such as the DASH diet and exercise as tolerated.  °

## 2022-05-28 NOTE — Assessment & Plan Note (Signed)
Follows with Dr Amalia Hailey urology at [facility] and she had urodynamic studies yesterday. Has trouble fully emptying her bladder. She has a cystoscopy in a few months

## 2022-05-28 NOTE — Assessment & Plan Note (Signed)
Supplement and monitor 

## 2022-05-28 NOTE — Assessment & Plan Note (Signed)
hgba1c acceptable, minimize simple carbs. Increase exercise as tolerated.  

## 2022-05-28 NOTE — Progress Notes (Signed)
Subjective:   By signing my name below, I, [name], attest that this documentation has been prepared under the direction and in the presence of [name], MD 05/28/2022.    Patient ID: Alexandra Patterson, female    DOB: 1956-06-08, 66 y.o.   MRN: [id number]  No chief complaint on file.  HPI Patient is in today for an office visit.  ROS: She denies chest pain or sickness today. She states that she often feels weak.   Blood pressure: Her blood pressure is within normal range today.  BP Readings from Last 3 Encounters:  05/28/22 120/72  04/30/22 118/76  01/22/22 128/70   Weight: She reports that she has lost 14 lbs since starting a weight loss program. She states she has been taking Testosterone pellets and has noticed no side effects.  Wt Readings from Last 3 Encounters:  05/28/22 261 lb 12.8 oz (118.8 kg)  04/30/22 268 lb 6.4 oz (121.7 kg)  01/22/22 274 lb (124.3 kg)   Diet: She states she has been on a strict diet due to her weight loss program. She says that she eats less than she is supposed to. She says that she is doing well at consuming an appropriate amount of protein and water. She has been educated about the [employer].   Urology: She states that she had a urodynamic test performed on 05/27/2022. She denies difficulty urinating, but does have difficulty emptying her bladder. She states that she has been having leakage.   Cardiology: She reports that her heart loop monitor was recently removed.  Sleep: She expresses that she does not want to undergo a sleep study. She states that she does snore and deals with nocturia.   Stress: She reports that her stress has been well-managed.   Immunizations: She has been informed about receiving COVID-19, high-dose Flu, and RSV vaccinations. She is due for a Pneumonia immunization. She is up to date on her Tetanus immunizations but will receive an immunization if she gets injured.   Vitamin D: She is currently taking vitamin D  daily.   Past Medical History:  Diagnosis Date   Allergy    Anxiety    Aortic stenosis    Atrial fibrillation (HCC)    Back pain    Bilateral swelling of feet    Bursitis    Cervical cancer screening 09/07/2015   Menarche at 11 Irregular and heavy and painful flow secondary to fibroids No history of abnormal pap in past, last pap roughly 2003 G2P2, s/p 2 SVD history of abnormal MGM, 1 abnl bx right breast benign, normal otherwise Noconcerns today TAH b/l SPO for fibroids and migrainesand breast bx on right gyn surgeries   Constipation    Cough 08/13/2015   Depression    Depression with anxiety 06/29/2016   Fainting    once to due heart out of rythm   Fibromyalgia    Frequent headaches    GERD (gastroesophageal reflux disease)    Hair loss disorder 06/03/2017   Heartburn    History of blood clots    History of chicken pox    Hx of blood clots    dvt   Hyperlipidemia    Hyperplastic colon polyp    Hypertension    Hypothyroid    Insomnia 08/13/2015   Internal hemorrhoids    Interstitial cystitis    Joint pain    Lesion of lung    xray in 2014 thought to be benign seen at [facility] pulmonology  Measles    h/o   Migraine    Obesity    Osteoarthritis    Preventative health care 02/03/2017   Raynaud's disease    S/P AVR (aortic valve replacement) 03/19/2015   Shoulder pain 06/03/2017   UTI (lower urinary tract infection)    UTI (urinary tract infection) 02/11/2016   Past Surgical History:  Procedure Laterality Date   ABDOMINAL HYSTERECTOMY     TAH SPO   AORTIC VALVE REPLACEMENT  2014   with [facility]   ATRIAL ABLATION SURGERY     BLADDER SUSPENSION     BREAST BIOPSY Right    Needle Biopsy   COLONOSCOPY WITH PROPOFOL N/A 12/28/2020   Procedure: COLONOSCOPY WITH PROPOFOL;  Surgeon: Virgel Manifold, MD;  Location: [facility] ENDOSCOPY;  Service: Endoscopy;  Laterality: N/A;   CORONARY ARTERY BYPASS GRAFT     DILATION AND CURETTAGE OF UTERUS     x 3    ESOPHAGOGASTRODUODENOSCOPY (EGD) WITH PROPOFOL N/A 12/28/2020   Procedure: ESOPHAGOGASTRODUODENOSCOPY (EGD) WITH PROPOFOL;  Surgeon: Virgel Manifold, MD;  Location: [facility] ENDOSCOPY;  Service: Endoscopy;  Laterality: N/A;   loop heart     SHOULDER SURGERY Left    arthroscopy for spurs   TONSILLECTOMY     TUBAL LIGATION     Family History  Problem Relation Age of Onset   Colon polyps Father    Alzheimer's disease Father    Alcohol abuse Father    Cancer Father        colon cancer   Arthritis Mother        s/p TKR   Neuropathy Mother    Hyperlipidemia Mother    Other Mother        familial mediterranean fever   Thyroid disease Mother    Hypertension Mother    Obesity Mother    Heart disease Mother    Cancer Brother        prostate cancer   Heart disease Brother        cardiomegaly   Other Brother        familial mediterranean fever   Asthma Maternal Grandmother    Congestive Heart Failure Maternal Grandmother    Heart disease Maternal Grandmother        chf   Stroke Maternal Grandfather    Diabetes Maternal Grandfather    Heart disease Maternal Grandfather        hardening of the arteries   Stroke Paternal Grandfather    Atrial fibrillation Paternal Grandfather    Alcohol abuse Paternal Grandfather    Heart disease Paternal Grandfather        afib   Interstitial cystitis Daughter    Arthritis Son    Alcohol abuse Son        in remission   Mental illness Son        depression   Other Son        interstitial cystitis   Colon cancer Other        parent   Arthritis Paternal Uncle    Social History   Socioeconomic History   Marital status: Married    Spouse name: Not on file   Number of children: 2   Years of education: Not on file   Highest education level: Bachelor's degree (e.g., BA, AB, BS)  Occupational History   Occupation: Best boy: [employer]  Tobacco Use   Smoking status: Never   Smokeless tobacco: Never  Substance  and Sexual Activity  Alcohol use: No   Drug use: No   Sexual activity: Yes    Comment: lives with husband and adult son, no major dietary restrictions, full diability  Other Topics Concern   Not on file  Social History Narrative   Not on file   Social Determinants of Health   Financial Resource Strain: Low Risk  (07/18/2021)   Overall Financial Resource Strain (CARDIA)    Difficulty of Paying Living Expenses: Not hard at all  Food Insecurity: No Food Insecurity (07/18/2021)   Hunger Vital Sign    Worried About Running Out of Food in the Last Year: Never true    [place] in the Last Year: Never true  Transportation Needs: No Transportation Needs (07/18/2021)   PRAPARE - Hydrologist (Medical): No    Lack of Transportation (Non-Medical): No  Physical Activity: Unknown (07/18/2021)   Exercise Vital Sign    Days of Exercise per Week: 0 days    Minutes of Exercise per Session: Not on file  Recent Concern: Physical Activity - Inactive (07/18/2021)   Exercise Vital Sign    Days of Exercise per Week: 0 days    Minutes of Exercise per Session: 0 min  Stress: No Stress Concern Present (07/18/2021)   [place]    Feeling of Stress : Only a little  Social Connections: Moderately Integrated (07/18/2021)   Social Connection and Isolation Panel [NHANES]    Frequency of Communication with Friends and Family: Three times a week    Frequency of Social Gatherings with Friends and Family: Once a week    Attends Religious Services: More than 4 times per year    Active Member of [employer] or Organizations: No    Attends Music therapist: Not on file    Marital Status: Married  Human resources officer Violence: Not At Risk (04/24/2021)   Humiliation, Afraid, Rape, and Kick questionnaire    Fear of Current or Ex-Partner: No    Emotionally Abused: No    Physically Abused: No    Sexually  Abused: No   Outpatient Medications Prior to Visit  Medication Sig Dispense Refill   amitriptyline (ELAVIL) 25 MG tablet Take 1 tablet (25 mg total) by mouth at bedtime.     busPIRone (BUSPAR) 10 MG tablet TAKE ONE TABLET BY MOUTH EVERY MORNING and TAKE ONE TABLET BY MOUTH EVERYDAY AT BEDTIME 180 tablet 1   cyclobenzaprine (FLEXERIL) 5 MG tablet Take 5 mg by mouth 3 (three) times daily as needed for muscle spasms.     DULoxetine (CYMBALTA) 60 MG capsule TAKE TWO CAPSULES BY MOUTH EVERY MORNING 180 capsule 1   esomeprazole (NEXIUM) 40 MG capsule Take 40 mg by mouth 2 (two) times daily before a meal.      hydrOXYzine (ATARAX/VISTARIL) 25 MG tablet Take 1 tablet (25 mg total) by mouth at bedtime as needed. 90 tablet 1   levothyroxine (SYNTHROID) 125 MCG tablet TAKE ONE TABLET BY MOUTH monday-saturday before breakfast 78 tablet 1   losartan (COZAAR) 100 MG tablet TAKE ONE TABLET BY MOUTH EVERY MORNING 90 tablet 1   Magnesium 400 MG CAPS Take 400 mg by mouth daily. Reported on 02/13/2016     meloxicam (MOBIC) 15 MG tablet TAKE ONE TABLET BY MOUTH EVERY MORNING 90 tablet 1   Methen-Hyosc-Meth Blue-Na Phos (UROGESIC-BLUE) 81.6 MG TABS Take 1 tablet (81.6 mg total) by mouth 4 (four) times daily as needed.  metoprolol succinate (TOPROL-XL) 100 MG 24 hr tablet TAKE ONE TABLET BY MOUTH EVERY MORNING 90 tablet 1   metoprolol succinate (TOPROL-XL) 50 MG 24 hr tablet TAKE ONE TABLET BY MOUTH EVERY MORNING 90 tablet 1   ondansetron (ZOFRAN) 4 MG tablet Take 4 mg by mouth every 8 (eight) hours as needed for nausea or vomiting.     Oxycodone HCl 10 MG TABS Take 1 tablet (10 mg total) by mouth 3 (three) times daily as needed. 90 tablet 0   rosuvastatin (CRESTOR) 5 MG tablet TAKE 1 TABLET BY MOUTH EVERYDAY AT BEDTIME 30 tablet 3   zonisamide (ZONEGRAN) 100 MG capsule Take 200 mg by mouth 2 (two) times daily.     No facility-administered medications prior to visit.   Allergies  Allergen Reactions    Ciprofloxacin Rash and Shortness Of Breath   Sulfa Antibiotics Hives   Sulfasalazine Hives   Cortisone Other (See Comments)    Red flush and anxiety   Tramadol Other (See Comments)    headache   Zonisamide Other (See Comments)    unk   Erythromycin Rash   Erythromycin Base Rash   Levofloxacin Nausea Only   Lisinopril     Other reaction(s): Cough (ALLERGY/intolerance)   Prednisone Rash and Anxiety   Review of Systems  Cardiovascular:  Negative for chest pain.  Genitourinary:        (+) Leakage.   Neurological:  Positive for weakness.      Objective:    Physical Exam Constitutional:      General: She is not in acute distress.    Appearance: Normal appearance. She is not ill-appearing.  HENT:     Head: Normocephalic and atraumatic.     Right Ear: External ear normal.     Left Ear: External ear normal.     Mouth/Throat:     Mouth: Mucous membranes are moist.     Pharynx: Oropharynx is clear.  Eyes:     Extraocular Movements: Extraocular movements intact.     Pupils: Pupils are equal, round, and reactive to light.  Cardiovascular:     Rate and Rhythm: Normal rate and regular rhythm.     Pulses: Normal pulses.     Heart sounds: Normal heart sounds. No murmur heard.    No gallop.  Pulmonary:     Effort: Pulmonary effort is normal. No respiratory distress.     Breath sounds: Normal breath sounds. No wheezing or rales.  Abdominal:     General: Bowel sounds are normal.  Skin:    General: Skin is warm and dry.  Neurological:     Mental Status: She is alert and oriented to person, place, and time.  Psychiatric:        Mood and Affect: Mood normal.        Behavior: Behavior normal.        Judgment: Judgment normal.    There were no vitals taken for this visit. Wt Readings from Last 3 Encounters:  04/30/22 268 lb 6.4 oz (121.7 kg)  01/22/22 274 lb (124.3 kg)  10/24/21 279 lb 14.4 oz (127 kg)   Diabetic Foot Exam - Simple   No data filed    Lab Results   Component Value Date   WBC 6.2 03/05/2022   HGB 12.1 03/05/2022   HCT 37.4 03/05/2022   PLT 167.0 03/05/2022   GLUCOSE 90 03/05/2022   CHOL 271 (H) 01/22/2022   TRIG 102.0 01/22/2022   HDL 75.50 01/22/2022   LDLDIRECT  103.0 06/03/2017   LDLCALC 175 (H) 01/22/2022   ALT 10 03/05/2022   AST 12 03/05/2022   NA 138 03/05/2022   K 4.8 03/05/2022   CL 103 03/05/2022   CREATININE 1.08 03/05/2022   BUN 19 03/05/2022   CO2 26 03/05/2022   TSH 1.65 03/05/2022   HGBA1C 5.7 01/22/2022   Lab Results  Component Value Date   TSH 1.65 03/05/2022   Lab Results  Component Value Date   WBC 6.2 03/05/2022   HGB 12.1 03/05/2022   HCT 37.4 03/05/2022   MCV 86.4 03/05/2022   PLT 167.0 03/05/2022   Lab Results  Component Value Date   NA 138 03/05/2022   K 4.8 03/05/2022   CO2 26 03/05/2022   GLUCOSE 90 03/05/2022   BUN 19 03/05/2022   CREATININE 1.08 03/05/2022   BILITOT 0.4 03/05/2022   ALKPHOS 103 03/05/2022   AST 12 03/05/2022   ALT 10 03/05/2022   PROT 5.9 (L) 03/05/2022   ALBUMIN 3.9 03/05/2022   CALCIUM 9.1 03/05/2022   GFR 53.39 (L) 03/05/2022   Lab Results  Component Value Date   CHOL 271 (H) 01/22/2022   Lab Results  Component Value Date   HDL 75.50 01/22/2022   Lab Results  Component Value Date   LDLCALC 175 (H) 01/22/2022   Lab Results  Component Value Date   TRIG 102.0 01/22/2022   Lab Results  Component Value Date   CHOLHDL 4 01/22/2022   Lab Results  Component Value Date   HGBA1C 5.7 01/22/2022      Assessment & Plan:   Problem List Items Addressed This Visit   None  No orders of the defined types were placed in this encounter.  I, [name], personally preformed the services described in this documentation.  All medical record entries made by the scribe were at my direction and in my presence.  I have reviewed the chart and discharge instructions (if applicable) and agree that the record reflects my personal performance and is accurate  and complete. 05/28/2022  I,Mohammed Iqbal,acting as a scribe for Penni Homans, MD.,have documented all relevant documentation on the behalf of Penni Homans, MD,as directed by  Penni Homans, MD while in the presence of Penni Homans, MD.  [name]

## 2022-05-28 NOTE — Assessment & Plan Note (Signed)
>>  ASSESSMENT AND PLAN FOR CLASS 2 SEVERE OBESITY WITH SERIOUS COMORBIDITY AND BODY MASS INDEX (BMI) OF 36.0 TO 36.9 IN ADULT, UNSPECIFIED OBESITY TYPE (HCC) WRITTEN ON 05/28/2022  2:00 PM BY BLYTH, STACEY A, MD  Encouraged PURE or MIND diet, decrease po intake and increase exercise as tolerated. Needs 7-8 hours of sleep nightly. Avoid trans fats, eat small, frequent meals every 4-5 hours with lean proteins, complex carbs and healthy fats. Minimize simple carbs, high fat foods and processed foods. Is following with [facility] MD and has lost 14 pounds

## 2022-05-28 NOTE — Assessment & Plan Note (Signed)
RRR today 

## 2022-05-28 NOTE — Assessment & Plan Note (Addendum)
Encouraged PURE or MIND diet, decrease po intake and increase exercise as tolerated. Needs 7-8 hours of sleep nightly. Avoid trans fats, eat small, frequent meals every 4-5 hours with lean proteins, complex carbs and healthy fats. Minimize simple carbs, high fat foods and processed foods. Is following with [facility] MD and has lost 14 pounds

## 2022-05-28 NOTE — Patient Instructions (Addendum)
PURE diet  RSV (Respiratory Syncitial Virus) vaccine at pharmacy once  Flu shot September or October  New COVID booster out by early October

## 2022-05-28 NOTE — Assessment & Plan Note (Signed)
Repeat labs today

## 2022-05-29 ENCOUNTER — Telehealth: Payer: Self-pay | Admitting: *Deleted

## 2022-05-29 ENCOUNTER — Other Ambulatory Visit: Payer: Self-pay

## 2022-05-29 DIAGNOSIS — E559 Vitamin D deficiency, unspecified: Secondary | ICD-10-CM

## 2022-05-29 LAB — CBC
HCT: 43 % (ref 36.0–46.0)
Hemoglobin: 14 g/dL (ref 12.0–15.0)
MCHC: 32.5 g/dL (ref 30.0–36.0)
MCV: 86.6 fl (ref 78.0–100.0)
Platelets: 256 10*3/uL (ref 150.0–400.0)
RBC: 4.96 Mil/uL (ref 3.87–5.11)
RDW: 16.6 % — ABNORMAL HIGH (ref 11.5–15.5)
WBC: 8 10*3/uL (ref 4.0–10.5)

## 2022-05-29 LAB — T4, FREE: Free T4: 0.91 ng/dL (ref 0.60–1.60)

## 2022-05-29 LAB — COMPREHENSIVE METABOLIC PANEL
ALT: 13 U/L (ref 0–35)
AST: 13 U/L (ref 0–37)
Albumin: 4.3 g/dL (ref 3.5–5.2)
Alkaline Phosphatase: 81 U/L (ref 39–117)
BUN: 20 mg/dL (ref 6–23)
CO2: 25 mEq/L (ref 19–32)
Calcium: 9.7 mg/dL (ref 8.4–10.5)
Chloride: 101 mEq/L (ref 96–112)
Creatinine, Ser: 1 mg/dL (ref 0.40–1.20)
GFR: 58.46 mL/min — ABNORMAL LOW (ref >60.00)
Glucose, Bld: 92 mg/dL (ref 70–99)
Potassium: 4.9 mEq/L (ref 3.5–5.1)
Sodium: 137 mEq/L (ref 135–145)
Total Bilirubin: 0.4 mg/dL (ref 0.2–1.2)
Total Protein: 6.4 g/dL (ref 6.0–8.3)

## 2022-05-29 LAB — LIPID PANEL
Cholesterol: 170 mg/dL (ref 0–200)
HDL: 57.6 mg/dL (ref >39.00)
LDL Cholesterol: 91 mg/dL (ref 0–99)
NonHDL: 112.17
Total CHOL/HDL Ratio: 3
Triglycerides: 104 mg/dL (ref 0.0–149.0)
VLDL: 20.8 mg/dL (ref 0.0–40.0)

## 2022-05-29 LAB — VITAMIN D 25 HYDROXY (VIT D DEFICIENCY, FRACTURES): VITD: 118.37 ng/mL (ref 30.00–100.00)

## 2022-05-29 LAB — THYROID PEROXIDASE ANTIBODIES (TPO) (REFL): Thyroperoxidase Ab SerPl-aCnc: 15 IU/mL — ABNORMAL HIGH (ref <9)

## 2022-05-29 LAB — T3, FREE: T3, Free: 2.7 pg/mL (ref 2.3–4.2)

## 2022-05-29 LAB — HEMOGLOBIN A1C: Hgb A1c MFr Bld: 5.7 % (ref 4.6–6.5)

## 2022-05-29 LAB — TSH: TSH: 0.88 u[IU]/mL (ref 0.35–5.50)

## 2022-05-29 NOTE — Telephone Encounter (Signed)
Routing to DOD and PCP in PCP absence:   CRITICAL VALUE STICKER  CRITICAL VALUE:  Vit D  118.37  RECEIVER (on-site recipient of call): Kelle Darting, [place] NOTIFIED: 05/29/22 @ 11:58  MESSENGER (representative from lab): [place]  MD NOTIFIED: [name] / Nani Ravens  TIME OF NOTIFICATION: 12pm  RESPONSE:

## 2022-05-29 NOTE — Telephone Encounter (Signed)
Called pt lab appt made and lab orders are in

## 2022-05-29 NOTE — Assessment & Plan Note (Signed)
Encouraged increased hydration, 64 ounces of clear fluids daily. Minimize alcohol and caffeine. Eat small frequent meals with lean proteins and complex carbs. Avoid high and low blood sugars. Get adequate sleep, 7-8 hours a night. Needs exercise daily preferably in the morning.  

## 2022-05-29 NOTE — Telephone Encounter (Signed)
Ca WNL. Have pt stop home Vit D supps, recheck in 2 weeks. Ty.

## 2022-05-30 LAB — DRUG MONITORING PANEL 376104, URINE
Amphetamines: NEGATIVE ng/mL (ref <500)
Barbiturates: NEGATIVE ng/mL (ref <300)
Benzodiazepines: NEGATIVE ng/mL (ref <100)
Cocaine Metabolite: NEGATIVE ng/mL (ref <150)
Codeine: NEGATIVE ng/mL (ref <50)
Desmethyltramadol: NEGATIVE ng/mL (ref <100)
Hydrocodone: NEGATIVE ng/mL (ref <50)
Hydromorphone: NEGATIVE ng/mL (ref <50)
Morphine: NEGATIVE ng/mL (ref <50)
Norhydrocodone: NEGATIVE ng/mL (ref <50)
Noroxycodone: 388 ng/mL — ABNORMAL HIGH (ref <50)
Opiates: NEGATIVE ng/mL (ref <100)
Oxycodone: NEGATIVE ng/mL (ref <50)
Oxycodone: POSITIVE ng/mL — AB (ref <100)
Oxymorphone: 53 ng/mL — ABNORMAL HIGH (ref <50)
Tramadol: NEGATIVE ng/mL (ref <100)

## 2022-05-30 LAB — DM TEMPLATE

## 2022-06-04 DIAGNOSIS — E038 Other specified hypothyroidism: Secondary | ICD-10-CM | POA: Diagnosis not present

## 2022-06-04 DIAGNOSIS — N951 Menopausal and female climacteric states: Secondary | ICD-10-CM | POA: Diagnosis not present

## 2022-06-06 DIAGNOSIS — R232 Flushing: Secondary | ICD-10-CM | POA: Diagnosis not present

## 2022-06-06 DIAGNOSIS — R6882 Decreased libido: Secondary | ICD-10-CM | POA: Diagnosis not present

## 2022-06-06 DIAGNOSIS — Z6838 Body mass index (BMI) 38.0-38.9, adult: Secondary | ICD-10-CM | POA: Diagnosis not present

## 2022-06-06 DIAGNOSIS — N951 Menopausal and female climacteric states: Secondary | ICD-10-CM | POA: Diagnosis not present

## 2022-06-10 ENCOUNTER — Encounter: Payer: Self-pay | Admitting: Family Medicine

## 2022-06-11 ENCOUNTER — Ambulatory Visit (HOSPITAL_BASED_OUTPATIENT_CLINIC_OR_DEPARTMENT_OTHER)
Admission: RE | Admit: 2022-06-11 | Discharge: 2022-06-11 | Disposition: A | Discharge status: Home / Self Care | Payer: PPO | Source: Ambulatory Visit | Attending: Family Medicine | Admitting: Family Medicine

## 2022-06-11 ENCOUNTER — Encounter: Payer: Self-pay | Admitting: Family Medicine

## 2022-06-11 ENCOUNTER — Ambulatory Visit (INDEPENDENT_AMBULATORY_CARE_PROVIDER_SITE_OTHER): Payer: PPO | Admitting: Family Medicine

## 2022-06-11 ENCOUNTER — Other Ambulatory Visit: Payer: Self-pay | Admitting: Family Medicine

## 2022-06-11 VITALS — BP 128/90 | HR 80 | Temp 97.9°F | Resp 18 | Ht 68.0 in | Wt 258.0 lb

## 2022-06-11 DIAGNOSIS — S62614A Displaced fracture of proximal phalanx of right ring finger, initial encounter for closed fracture: Secondary | ICD-10-CM | POA: Diagnosis not present

## 2022-06-11 DIAGNOSIS — S62624A Displaced fracture of medial phalanx of right ring finger, initial encounter for closed fracture: Secondary | ICD-10-CM | POA: Diagnosis not present

## 2022-06-11 DIAGNOSIS — M7989 Other specified soft tissue disorders: Secondary | ICD-10-CM | POA: Diagnosis not present

## 2022-06-11 DIAGNOSIS — M79644 Pain in right finger(s): Secondary | ICD-10-CM | POA: Diagnosis not present

## 2022-06-11 NOTE — Progress Notes (Addendum)
Subjective:   By signing my name below, I, Alexandra Patterson, attest that this documentation has been prepared under the direction and in the presence of Alexandra Held, DO  06/11/2022    Patient ID: Alexandra Patterson, female    DOB: 1956-04-13, 66 y.o.   MRN: [id number]  Chief Complaint  Patient presents with   Fall    Pt states she fell on Sunday. Pt states she tripped and fell on her knees and caught herself with her hand. Ring finger on the right hand. Pt states having swelling, pain, stiffness.    Fall Pertinent negatives include no abdominal pain, fever, headaches, hematuria, loss of consciousness, nausea or vomiting.   Patient is in today for a office visit.   She complains of swollen, stiff and painful right 4th digit. She also complains of bilateral knee pain. She tripped on a box and fell forward and landed on her knees and right hand. She has a history of arthritis in both knees. She continues taking her pain medication used to treat her fibromyalgia to also manage her current pain. She is taking no other medications to manage her pain.    Past Medical History:  Diagnosis Date   Allergy    Anxiety    Aortic stenosis    Atrial fibrillation (HCC)    Back pain    Bilateral swelling of feet    Bursitis    Cervical cancer screening 09/07/2015   Menarche at 11 Irregular and heavy and painful flow secondary to fibroids No history of abnormal pap in past, last pap roughly 2003 G2P2, s/p 2 SVD history of abnormal MGM, 1 abnl bx right breast benign, normal otherwise Noconcerns today TAH b/l SPO for fibroids and migrainesand breast bx on right gyn surgeries   Constipation    Cough 08/13/2015   Depression    Depression with anxiety 06/29/2016   Fainting    once to due heart out of rythm   Fibromyalgia    Frequent headaches    GERD (gastroesophageal reflux disease)    Hair loss disorder 06/03/2017   Heartburn    History of blood clots    History of chicken pox    Hx of blood  clots    dvt   Hyperlipidemia    Hyperplastic colon polyp    Hypertension    Hypothyroid    Insomnia 08/13/2015   Internal hemorrhoids    Interstitial cystitis    Joint pain    Lesion of lung    xray in 2014 thought to be benign seen at [facility] pulmonology   Measles    h/o   Migraine    Obesity    Osteoarthritis    Preventative health care 02/03/2017   Raynaud's disease    S/P AVR (aortic valve replacement) 03/19/2015   Shoulder pain 06/03/2017   UTI (lower urinary tract infection)    UTI (urinary tract infection) 02/11/2016    Past Surgical History:  Procedure Laterality Date   ABDOMINAL HYSTERECTOMY     TAH SPO   AORTIC VALVE REPLACEMENT  2014   with [facility]   ATRIAL ABLATION SURGERY     BLADDER SUSPENSION     BREAST BIOPSY Right    Needle Biopsy   COLONOSCOPY WITH PROPOFOL N/A 12/28/2020   Procedure: COLONOSCOPY WITH PROPOFOL;  Surgeon: Alexandra Manifold, MD;  Location: [facility] ENDOSCOPY;  Service: Endoscopy;  Laterality: N/A;   CORONARY ARTERY BYPASS GRAFT     DILATION AND CURETTAGE OF UTERUS  x 3   ESOPHAGOGASTRODUODENOSCOPY (EGD) WITH PROPOFOL N/A 12/28/2020   Procedure: ESOPHAGOGASTRODUODENOSCOPY (EGD) WITH PROPOFOL;  Surgeon: Alexandra Manifold, MD;  Location: [facility] ENDOSCOPY;  Service: Endoscopy;  Laterality: N/A;   loop heart     SHOULDER SURGERY Left    arthroscopy for spurs   TONSILLECTOMY     TUBAL LIGATION      Family History  Problem Relation Age of Onset   Colon polyps Father    Alzheimer's disease Father    Alcohol abuse Father    Cancer Father        colon cancer   Arthritis Mother        s/p TKR   Neuropathy Mother    Hyperlipidemia Mother    Other Mother        familial mediterranean fever   Thyroid disease Mother    Hypertension Mother    Obesity Mother    Heart disease Mother    Cancer Brother        prostate cancer   Heart disease Brother        cardiomegaly   Other Brother        familial mediterranean fever   Asthma  Maternal Grandmother    Congestive Heart Failure Maternal Grandmother    Heart disease Maternal Grandmother        chf   Stroke Maternal Grandfather    Diabetes Maternal Grandfather    Heart disease Maternal Grandfather        hardening of the arteries   Stroke Paternal Grandfather    Atrial fibrillation Paternal Grandfather    Alcohol abuse Paternal Grandfather    Heart disease Paternal Grandfather        afib   Interstitial cystitis Daughter    Arthritis Son    Alcohol abuse Son        in remission   Mental illness Son        depression   Other Son        interstitial cystitis   Colon cancer Other        parent   Arthritis Paternal Uncle     Social History   Socioeconomic History   Marital status: Married    Spouse name: Not on file   Number of children: 2   Years of education: Not on file   Highest education level: Bachelor's degree (e.g., BA, AB, BS)  Occupational History   Occupation: Best boy: [employer]  Tobacco Use   Smoking status: Never   Smokeless tobacco: Never  Substance and Sexual Activity   Alcohol use: No   Drug use: No   Sexual activity: Yes    Comment: lives with husband and adult son, no major dietary restrictions, full diability  Other Topics Concern   Not on file  Social History Narrative   Not on file   Social Determinants of Health   Financial Resource Strain: Low Risk  (07/18/2021)   Overall Financial Resource Strain (CARDIA)    Difficulty of Paying Living Expenses: Not hard at all  Food Insecurity: No Food Insecurity (07/18/2021)   Hunger Vital Sign    Worried About Running Out of Food in the Last Year: Never true    Shaw in the Last Year: Never true  Transportation Needs: No Transportation Needs (07/18/2021)   PRAPARE - Hydrologist (Medical): No    Lack of Transportation (Non-Medical): No  Physical Activity: Unknown (07/18/2021)  Exercise Vital Sign    Days of  Exercise per Week: 0 days    Minutes of Exercise per Session: Not on file  Recent Concern: Physical Activity - Inactive (07/18/2021)   Exercise Vital Sign    Days of Exercise per Week: 0 days    Minutes of Exercise per Session: 0 min  Stress: No Stress Concern Present (07/18/2021)   [place]    Feeling of Stress : Only a little  Social Connections: Moderately Integrated (07/18/2021)   Social Connection and Isolation Panel [NHANES]    Frequency of Communication with Friends and Family: Three times a week    Frequency of Social Gatherings with Friends and Family: Once a week    Attends Religious Services: More than 4 times per year    Active Member of [employer] or Organizations: No    Attends Music therapist: Not on file    Marital Status: Married  Human resources officer Violence: Not At Risk (04/24/2021)   Humiliation, Afraid, Rape, and Kick questionnaire    Fear of Current or Ex-Partner: No    Emotionally Abused: No    Physically Abused: No    Sexually Abused: No    Outpatient Medications Prior to Visit  Medication Sig Dispense Refill   amitriptyline (ELAVIL) 25 MG tablet Take 1 tablet (25 mg total) by mouth at bedtime.     busPIRone (BUSPAR) 10 MG tablet TAKE ONE TABLET BY MOUTH EVERY MORNING and TAKE ONE TABLET BY MOUTH EVERYDAY AT BEDTIME 180 tablet 1   cyclobenzaprine (FLEXERIL) 5 MG tablet Take 5 mg by mouth 3 (three) times daily as needed for muscle spasms.     DULoxetine (CYMBALTA) 60 MG capsule TAKE TWO CAPSULES BY MOUTH EVERY MORNING 180 capsule 1   esomeprazole (NEXIUM) 40 MG capsule Take 40 mg by mouth 2 (two) times daily before a meal.      hydrOXYzine (ATARAX/VISTARIL) 25 MG tablet Take 1 tablet (25 mg total) by mouth at bedtime as needed. 90 tablet 1   levothyroxine (SYNTHROID) 125 MCG tablet TAKE ONE TABLET BY MOUTH monday-saturday before breakfast 78 tablet 1   losartan (COZAAR) 100 MG tablet TAKE  ONE TABLET BY MOUTH EVERY MORNING 90 tablet 1   Magnesium 400 MG CAPS Take 400 mg by mouth daily. Reported on 02/13/2016     meloxicam (MOBIC) 15 MG tablet TAKE ONE TABLET BY MOUTH EVERY MORNING 90 tablet 1   Methen-Hyosc-Meth Blue-Na Phos (UROGESIC-BLUE) 81.6 MG TABS Take 1 tablet (81.6 mg total) by mouth 4 (four) times daily as needed.     metoprolol succinate (TOPROL-XL) 100 MG 24 hr tablet TAKE ONE TABLET BY MOUTH EVERY MORNING 90 tablet 1   metoprolol succinate (TOPROL-XL) 50 MG 24 hr tablet TAKE ONE TABLET BY MOUTH EVERY MORNING 90 tablet 1   ondansetron (ZOFRAN) 4 MG tablet Take 4 mg by mouth every 8 (eight) hours as needed for nausea or vomiting.     Oxycodone HCl 10 MG TABS Take 1 tablet (10 mg total) by mouth 3 (three) times daily as needed. 90 tablet 0   rosuvastatin (CRESTOR) 5 MG tablet TAKE 1 TABLET BY MOUTH EVERYDAY AT BEDTIME 30 tablet 3   zonisamide (ZONEGRAN) 100 MG capsule Take 200 mg by mouth 2 (two) times daily.     No facility-administered medications prior to visit.    Allergies  Allergen Reactions   Ciprofloxacin Rash and Shortness Of Breath   Sulfa Antibiotics Hives  Sulfasalazine Hives   Cortisone Other (See Comments)    Red flush and anxiety   Tramadol Other (See Comments)    headache   Zonisamide Other (See Comments)    unk   Erythromycin Rash   Erythromycin Base Rash   Levofloxacin Nausea Only   Lisinopril     Other reaction(s): Cough (ALLERGY/intolerance)   Prednisone Rash and Anxiety    Review of Systems  Constitutional:  Negative for chills, fever and malaise/fatigue.  HENT:  Negative for congestion and hearing loss.   Eyes:  Negative for blurred vision and discharge.  Respiratory:  Negative for cough, sputum production and shortness of breath.   Cardiovascular:  Negative for chest pain, palpitations and leg swelling.  Gastrointestinal:  Negative for abdominal pain, blood in stool, constipation, diarrhea, heartburn, nausea and vomiting.   Genitourinary:  Negative for dysuria, frequency, hematuria and urgency.  Musculoskeletal:  Negative for back pain, falls and myalgias.       (+)swollen 4th digit of right hand (+)pain in bilateral knees (+)pain in 4th digit of right hand  Skin:  Negative for rash.  Neurological:  Negative for dizziness, sensory change, loss of consciousness, weakness and headaches.  Endo/Heme/Allergies:  Negative for environmental allergies. Does not bruise/bleed easily.  Psychiatric/Behavioral:  Negative for depression and suicidal ideas. The patient is not nervous/anxious and does not have insomnia.        Objective:    Physical Exam Vitals and nursing note reviewed.  Constitutional:      General: She is not in acute distress.    Appearance: Normal appearance. She is well-developed. She is not ill-appearing.  HENT:     Head: Normocephalic and atraumatic.     Right Ear: External ear normal.     Left Ear: External ear normal.  Eyes:     Extraocular Movements: Extraocular movements intact.     Conjunctiva/sclera: Conjunctivae normal.     Pupils: Pupils are equal, round, and reactive to light.  Neck:     Thyroid: No thyromegaly.     Vascular: No carotid bruit or JVD.  Cardiovascular:     Rate and Rhythm: Normal rate and regular rhythm.     Heart sounds: Normal heart sounds. No murmur heard.    No gallop.  Pulmonary:     Effort: Pulmonary effort is normal. No respiratory distress.     Breath sounds: Normal breath sounds. No wheezing or rales.  Chest:     Chest wall: No tenderness.  Musculoskeletal:     Cervical back: Normal range of motion and neck supple.     Comments: R hand 4th finger ---- swelling and + bruising  Tenderness on palpation   Skin:    General: Skin is warm and dry.  Neurological:     Mental Status: She is alert and oriented to person, place, and time.  Psychiatric:        Judgment: Judgment normal.     BP (!) 128/90 (BP Location: Left Arm, Patient Position: Sitting,  Cuff Size: Large)   Pulse 80   Temp 97.9 F (36.6 C) (Oral)   Resp 18   Ht '5\' 8"'$  (1.727 m)   Wt 258 lb (117 kg)   SpO2 96%   BMI 39.23 kg/m  Wt Readings from Last 3 Encounters:  06/11/22 258 lb (117 kg)  05/28/22 261 lb 12.8 oz (118.8 kg)  04/30/22 268 lb 6.4 oz (121.7 kg)    Diabetic Foot Exam - Simple   No data filed  Lab Results  Component Value Date   WBC 8.0 05/28/2022   HGB 14.0 05/28/2022   HCT 43.0 05/28/2022   PLT 256.0 05/28/2022   GLUCOSE 92 05/28/2022   CHOL 170 05/28/2022   TRIG 104.0 05/28/2022   HDL 57.60 05/28/2022   LDLDIRECT 103.0 06/03/2017   LDLCALC 91 05/28/2022   ALT 13 05/28/2022   AST 13 05/28/2022   NA 137 05/28/2022   K 4.9 05/28/2022   CL 101 05/28/2022   CREATININE 1.00 05/28/2022   BUN 20 05/28/2022   CO2 25 05/28/2022   TSH 0.88 05/28/2022   HGBA1C 5.7 05/28/2022    Lab Results  Component Value Date   TSH 0.88 05/28/2022   Lab Results  Component Value Date   WBC 8.0 05/28/2022   HGB 14.0 05/28/2022   HCT 43.0 05/28/2022   MCV 86.6 05/28/2022   PLT 256.0 05/28/2022   Lab Results  Component Value Date   NA 137 05/28/2022   K 4.9 05/28/2022   CO2 25 05/28/2022   GLUCOSE 92 05/28/2022   BUN 20 05/28/2022   CREATININE 1.00 05/28/2022   BILITOT 0.4 05/28/2022   ALKPHOS 81 05/28/2022   AST 13 05/28/2022   ALT 13 05/28/2022   PROT 6.4 05/28/2022   ALBUMIN 4.3 05/28/2022   CALCIUM 9.7 05/28/2022   GFR 58.46 (L) 05/28/2022   Lab Results  Component Value Date   CHOL 170 05/28/2022   Lab Results  Component Value Date   HDL 57.60 05/28/2022   Lab Results  Component Value Date   LDLCALC 91 05/28/2022   Lab Results  Component Value Date   TRIG 104.0 05/28/2022   Lab Results  Component Value Date   CHOLHDL 3 05/28/2022   Lab Results  Component Value Date   HGBA1C 5.7 05/28/2022       Assessment & Plan:   Problem List Items Addressed This Visit   None Visit Diagnoses     Finger pain, right    -   Primary   Relevant Orders   DG Finger Ring Right (Completed)        No orders of the defined types were placed in this encounter.   IAnn Held, DO, personally preformed the services described in this documentation.  All medical record entries made by the scribe were at my direction and in my presence.  I have reviewed the chart and discharge instructions (if applicable) and agree that the record reflects my personal performance and is accurate and complete. 06/11/2022   I,Alexandra Patterson,acting as a Education administrator for [employer], DO.,have documented all relevant documentation on the behalf of Alexandra Held, DO,as directed by  Alexandra Held, DO while in the presence of Alexandra Held, DO.   Alexandra Held, DO

## 2022-06-11 NOTE — Patient Instructions (Signed)
Finger Fracture, Adult A finger fracture is a break in any of the finger bones. What are the causes? The main cause of finger fractures is injury, such as from sports, a fall, or closing your finger in a drawer or door. What increases the risk? The following factors may make you more likely to develop this condition: Playing sports. Workplace activities that involve machinery. Having weak bones (osteoporosis). This condition makes your bones less dense and causes them to break easily. What are the signs or symptoms? The main symptoms of a fractured finger are pain, bruising, and swelling shortly after the injury. Other symptoms include: Stiffness. Exposed bones (compound fracture or open fracture), in severe cases. How is this diagnosed? This condition is diagnosed based on a physical exam, your medical history, and your symptoms. An X-ray will also be done to confirm the diagnosis. How is this treated? Treatment for this condition depends on the severity of the fracture. If the bones are still in place, the finger may be splinted to keep the finger still while it heals (immobilization). If several fingers are fractured, you may need a cast. A cast may be applied up to the elbow to keep your fingers and hand from moving. If the bones are out of place, your health care provider may move them back into place manually or surgically. You may also need to do physical therapy exercises to improve movement and strength in your finger. Follow these instructions at home: If you have a removable splint:  Wear the splint as told by your health care provider. Remove it only as told by your health care provider. Check the skin around the splint every day. Tell your health care provider about any concerns. Loosen the splint if your fingers tingle, become numb, or turn cold and blue. Keep the splint clean and dry. If you have a nonremovable cast: Do not put pressure on any part of the cast until it is  fully hardened. This may take several hours. Do not stick anything inside the cast to scratch your skin. Doing that increases your risk of infection. Check the skin around the cast every day. Tell your health care provider about any concerns. You may put lotion on dry skin around the edges of the cast. Do not put lotion on the skin underneath the cast. Keep the cast clean and dry. Bathing Do not take baths, swim, or use a hot tub until your health care provider approves. Ask your health care provider if you may take showers. If your splint or cast is not waterproof: Do not let it get wet. Cover it with a watertight covering when you take a bath or shower. Managing pain, stiffness, and swelling If directed, put ice on the injured area. To do this: If you have a removable splint, remove it as told by your health care provider. Put ice in a plastic bag. Place a towel between your skin and the bag, or between your cast and the bag. Leave the ice on for 20 minutes, 2-3 times a day. Remove the ice if your skin turns bright red. This is very important. If you cannot feel pain, heat, or cold, you have a greater risk of damage to the area. Move your fingers often to reduce stiffness and swelling. Raise (elevate) the injured area above the level of your heart while you are sitting or lying down. Activity Ask your health care provider if the medicine prescribed to you requires you to avoid driving or using  machinery. Do physical therapy exercises as told by your health care provider. Return to your normal activities as told by your health care provider. Ask your health care provider what activities are safe for you. Ask your health care provider when it is safe to drive if you have a splint or cast on your finger. General instructions Do not use any products that contain nicotine or tobacco. These products include cigarettes, chewing tobacco, and vaping devices, such as e-cigarettes. These can delay  bone healing. If you need help quitting, ask your health care provider. Take over-the-counter and prescription medicines only as told by your health care provider. Keep all follow-up visits. This is important. Contact a health care provider if: Your pain or swelling gets worse, even with treatment. You have trouble moving your finger. Get help right away if: Your finger becomes numb or blue. Summary A finger fracture is a break in any of the finger bones. Injury is the main cause of finger fractures. Treatment for this condition depends on the severity of the fracture. This information is not intended to replace advice given to you by your health care provider. Make sure you discuss any questions you have with your health care provider. Document Revised: 09/05/2020 Document Reviewed: 07/25/2020 Elsevier Patient Education  [place].

## 2022-06-11 NOTE — Assessment & Plan Note (Signed)
Buddy tape Xray  Ortho if needed

## 2022-06-12 ENCOUNTER — Other Ambulatory Visit (INDEPENDENT_AMBULATORY_CARE_PROVIDER_SITE_OTHER): Payer: PPO

## 2022-06-12 DIAGNOSIS — E559 Vitamin D deficiency, unspecified: Secondary | ICD-10-CM

## 2022-06-12 DIAGNOSIS — S62604A Fracture of unspecified phalanx of right ring finger, initial encounter for closed fracture: Secondary | ICD-10-CM | POA: Diagnosis not present

## 2022-06-12 DIAGNOSIS — M79644 Pain in right finger(s): Secondary | ICD-10-CM | POA: Diagnosis not present

## 2022-06-12 LAB — VITAMIN D 25 HYDROXY (VIT D DEFICIENCY, FRACTURES): VITD: 98.24 ng/mL (ref 30.00–100.00)

## 2022-06-13 DIAGNOSIS — S62609A Fracture of unspecified phalanx of unspecified finger, initial encounter for closed fracture: Secondary | ICD-10-CM | POA: Diagnosis not present

## 2022-06-13 DIAGNOSIS — M79644 Pain in right finger(s): Secondary | ICD-10-CM | POA: Diagnosis not present

## 2022-06-13 DIAGNOSIS — S62624A Displaced fracture of medial phalanx of right ring finger, initial encounter for closed fracture: Secondary | ICD-10-CM | POA: Diagnosis not present

## 2022-06-14 DIAGNOSIS — S62609D Fracture of unspecified phalanx of unspecified finger, subsequent encounter for fracture with routine healing: Secondary | ICD-10-CM | POA: Diagnosis not present

## 2022-06-15 ENCOUNTER — Encounter: Payer: Self-pay | Admitting: Family Medicine

## 2022-06-17 MED ORDER — OXYCODONE HCL 10 MG PO TABS: 10.0000 mg | ORAL_TABLET | Freq: Three times a day (TID) | ORAL | 0 refills | Status: DC | PRN

## 2022-06-17 NOTE — Telephone Encounter (Signed)
Requesting: oxycodone '10mg'$   Contract: 05/28/22 UDS: 05/28/22 Last Visit: 06/11/22 w/ Etter Sjogren Next Visit: 12/03/22 Last Refill: 03/26/22 #90 and 0RF  Please Advise

## 2022-06-18 DIAGNOSIS — S62614A Displaced fracture of proximal phalanx of right ring finger, initial encounter for closed fracture: Secondary | ICD-10-CM | POA: Diagnosis not present

## 2022-07-01 DIAGNOSIS — Z4789 Encounter for other orthopedic aftercare: Secondary | ICD-10-CM | POA: Diagnosis not present

## 2022-07-01 DIAGNOSIS — S62604D Fracture of unspecified phalanx of right ring finger, subsequent encounter for fracture with routine healing: Secondary | ICD-10-CM | POA: Diagnosis not present

## 2022-07-02 DIAGNOSIS — E063 Autoimmune thyroiditis: Secondary | ICD-10-CM | POA: Diagnosis not present

## 2022-07-02 DIAGNOSIS — E038 Other specified hypothyroidism: Secondary | ICD-10-CM | POA: Diagnosis not present

## 2022-07-02 DIAGNOSIS — Z79890 Hormone replacement therapy (postmenopausal): Secondary | ICD-10-CM | POA: Diagnosis not present

## 2022-07-06 ENCOUNTER — Other Ambulatory Visit: Payer: Self-pay | Admitting: Family Medicine

## 2022-07-06 DIAGNOSIS — E061 Subacute thyroiditis: Secondary | ICD-10-CM

## 2022-07-10 ENCOUNTER — Encounter: Payer: Self-pay | Admitting: Family Medicine

## 2022-07-10 ENCOUNTER — Ambulatory Visit (INDEPENDENT_AMBULATORY_CARE_PROVIDER_SITE_OTHER): Payer: PPO | Admitting: *Deleted

## 2022-07-10 DIAGNOSIS — Z23 Encounter for immunization: Secondary | ICD-10-CM | POA: Diagnosis not present

## 2022-07-10 NOTE — Progress Notes (Signed)
Patient here for high dose flu vaccine.  Vaccine given in Right deltoid and patient tolerated well.

## 2022-07-18 ENCOUNTER — Other Ambulatory Visit: Payer: Self-pay | Admitting: Family Medicine

## 2022-07-19 DIAGNOSIS — S62604D Fracture of unspecified phalanx of right ring finger, subsequent encounter for fracture with routine healing: Secondary | ICD-10-CM | POA: Diagnosis not present

## 2022-07-23 DIAGNOSIS — Z6837 Body mass index (BMI) 37.0-37.9, adult: Secondary | ICD-10-CM | POA: Diagnosis not present

## 2022-07-23 DIAGNOSIS — N951 Menopausal and female climacteric states: Secondary | ICD-10-CM | POA: Diagnosis not present

## 2022-07-23 DIAGNOSIS — I10 Essential (primary) hypertension: Secondary | ICD-10-CM | POA: Diagnosis not present

## 2022-07-23 DIAGNOSIS — E038 Other specified hypothyroidism: Secondary | ICD-10-CM | POA: Diagnosis not present

## 2022-07-31 DIAGNOSIS — M25641 Stiffness of right hand, not elsewhere classified: Secondary | ICD-10-CM | POA: Diagnosis not present

## 2022-08-01 DIAGNOSIS — N951 Menopausal and female climacteric states: Secondary | ICD-10-CM | POA: Diagnosis not present

## 2022-08-01 DIAGNOSIS — M2550 Pain in unspecified joint: Secondary | ICD-10-CM | POA: Diagnosis not present

## 2022-08-01 DIAGNOSIS — Z6837 Body mass index (BMI) 37.0-37.9, adult: Secondary | ICD-10-CM | POA: Diagnosis not present

## 2022-08-01 DIAGNOSIS — G4700 Insomnia, unspecified: Secondary | ICD-10-CM | POA: Diagnosis not present

## 2022-08-01 DIAGNOSIS — Z79890 Hormone replacement therapy (postmenopausal): Secondary | ICD-10-CM | POA: Diagnosis not present

## 2022-08-05 DIAGNOSIS — S62604A Fracture of unspecified phalanx of right ring finger, initial encounter for closed fracture: Secondary | ICD-10-CM | POA: Diagnosis not present

## 2022-08-05 DIAGNOSIS — Z4789 Encounter for other orthopedic aftercare: Secondary | ICD-10-CM | POA: Diagnosis not present

## 2022-08-12 DIAGNOSIS — Z789 Other specified health status: Secondary | ICD-10-CM | POA: Diagnosis not present

## 2022-08-12 DIAGNOSIS — Z4789 Encounter for other orthopedic aftercare: Secondary | ICD-10-CM | POA: Diagnosis not present

## 2022-08-12 DIAGNOSIS — E063 Autoimmune thyroiditis: Secondary | ICD-10-CM | POA: Diagnosis not present

## 2022-08-12 DIAGNOSIS — S62609D Fracture of unspecified phalanx of unspecified finger, subsequent encounter for fracture with routine healing: Secondary | ICD-10-CM | POA: Diagnosis not present

## 2022-08-12 DIAGNOSIS — E038 Other specified hypothyroidism: Secondary | ICD-10-CM | POA: Diagnosis not present

## 2022-08-12 DIAGNOSIS — M25641 Stiffness of right hand, not elsewhere classified: Secondary | ICD-10-CM | POA: Diagnosis not present

## 2022-08-14 ENCOUNTER — Other Ambulatory Visit: Payer: Self-pay | Admitting: Family Medicine

## 2022-08-15 MED ORDER — OXYCODONE HCL 10 MG PO TABS: 10.0000 mg | ORAL_TABLET | Freq: Three times a day (TID) | ORAL | 0 refills | Status: DC | PRN

## 2022-08-15 NOTE — Telephone Encounter (Signed)
Requesting: oxycodone '10mg'$   Contract: 05/28/22 UDS: 05/28/22 Last Visit: 06/11/22 Next Visit: 12/03/22 Last Refill: 06/17/22 #90 and 0RF   Please Advise

## 2022-08-19 DIAGNOSIS — M25641 Stiffness of right hand, not elsewhere classified: Secondary | ICD-10-CM | POA: Diagnosis not present

## 2022-08-19 DIAGNOSIS — Z4789 Encounter for other orthopedic aftercare: Secondary | ICD-10-CM | POA: Diagnosis not present

## 2022-08-19 DIAGNOSIS — S62609D Fracture of unspecified phalanx of unspecified finger, subsequent encounter for fracture with routine healing: Secondary | ICD-10-CM | POA: Diagnosis not present

## 2022-08-19 DIAGNOSIS — Z789 Other specified health status: Secondary | ICD-10-CM | POA: Diagnosis not present

## 2022-08-26 DIAGNOSIS — Z4789 Encounter for other orthopedic aftercare: Secondary | ICD-10-CM | POA: Diagnosis not present

## 2022-08-26 DIAGNOSIS — Z789 Other specified health status: Secondary | ICD-10-CM | POA: Diagnosis not present

## 2022-08-26 DIAGNOSIS — M25641 Stiffness of right hand, not elsewhere classified: Secondary | ICD-10-CM | POA: Diagnosis not present

## 2022-08-26 DIAGNOSIS — S62609D Fracture of unspecified phalanx of unspecified finger, subsequent encounter for fracture with routine healing: Secondary | ICD-10-CM | POA: Diagnosis not present

## 2022-08-30 DIAGNOSIS — R5382 Chronic fatigue, unspecified: Secondary | ICD-10-CM | POA: Diagnosis not present

## 2022-08-30 DIAGNOSIS — M797 Fibromyalgia: Secondary | ICD-10-CM | POA: Diagnosis not present

## 2022-08-30 DIAGNOSIS — I480 Paroxysmal atrial fibrillation: Secondary | ICD-10-CM | POA: Diagnosis not present

## 2022-08-30 DIAGNOSIS — I493 Ventricular premature depolarization: Secondary | ICD-10-CM | POA: Diagnosis not present

## 2022-08-30 DIAGNOSIS — I350 Nonrheumatic aortic (valve) stenosis: Secondary | ICD-10-CM | POA: Diagnosis not present

## 2022-08-30 DIAGNOSIS — S62609D Fracture of unspecified phalanx of unspecified finger, subsequent encounter for fracture with routine healing: Secondary | ICD-10-CM | POA: Diagnosis not present

## 2022-08-30 DIAGNOSIS — Q208 Other congenital malformations of cardiac chambers and connections: Secondary | ICD-10-CM | POA: Diagnosis not present

## 2022-08-30 DIAGNOSIS — S62619D Displaced fracture of proximal phalanx of unspecified finger, subsequent encounter for fracture with routine healing: Secondary | ICD-10-CM | POA: Diagnosis not present

## 2022-08-30 DIAGNOSIS — Z952 Presence of prosthetic heart valve: Secondary | ICD-10-CM | POA: Diagnosis not present

## 2022-08-30 DIAGNOSIS — Z9889 Other specified postprocedural states: Secondary | ICD-10-CM | POA: Diagnosis not present

## 2022-08-30 DIAGNOSIS — I252 Old myocardial infarction: Secondary | ICD-10-CM | POA: Diagnosis not present

## 2022-08-30 DIAGNOSIS — Z4509 Encounter for adjustment and management of other cardiac device: Secondary | ICD-10-CM | POA: Diagnosis not present

## 2022-09-01 DIAGNOSIS — I480 Paroxysmal atrial fibrillation: Secondary | ICD-10-CM | POA: Diagnosis not present

## 2022-09-01 DIAGNOSIS — I493 Ventricular premature depolarization: Secondary | ICD-10-CM | POA: Diagnosis not present

## 2022-09-09 DIAGNOSIS — S62609D Fracture of unspecified phalanx of unspecified finger, subsequent encounter for fracture with routine healing: Secondary | ICD-10-CM | POA: Diagnosis not present

## 2022-09-09 DIAGNOSIS — Z4789 Encounter for other orthopedic aftercare: Secondary | ICD-10-CM | POA: Diagnosis not present

## 2022-09-27 DIAGNOSIS — E038 Other specified hypothyroidism: Secondary | ICD-10-CM | POA: Diagnosis not present

## 2022-09-27 DIAGNOSIS — E063 Autoimmune thyroiditis: Secondary | ICD-10-CM | POA: Diagnosis not present

## 2022-10-06 ENCOUNTER — Other Ambulatory Visit: Payer: Self-pay | Admitting: Family Medicine

## 2022-10-10 DIAGNOSIS — Z79899 Other long term (current) drug therapy: Secondary | ICD-10-CM | POA: Diagnosis not present

## 2022-10-10 DIAGNOSIS — G43909 Migraine, unspecified, not intractable, without status migrainosus: Secondary | ICD-10-CM | POA: Diagnosis not present

## 2022-10-14 ENCOUNTER — Inpatient Hospital Stay (HOSPITAL_BASED_OUTPATIENT_CLINIC_OR_DEPARTMENT_OTHER): Admission: RE | Admit: 2022-10-14 | Payer: PPO | Source: Ambulatory Visit

## 2022-10-14 ENCOUNTER — Other Ambulatory Visit: Payer: Self-pay | Admitting: Family Medicine

## 2022-10-14 ENCOUNTER — Other Ambulatory Visit (HOSPITAL_BASED_OUTPATIENT_CLINIC_OR_DEPARTMENT_OTHER): Payer: PPO

## 2022-10-14 MED ORDER — OXYCODONE HCL 10 MG PO TABS: 10.0000 mg | ORAL_TABLET | Freq: Three times a day (TID) | ORAL | 0 refills | Status: DC | PRN

## 2022-10-14 NOTE — Telephone Encounter (Signed)
Requesting:oxycodone '10mg'$   Contract: 05/28/22 UDS: 05/28/22 Last Visit: 06/11/22 w/ Etter Sjogren Next Visit: 12/03/22 Last Refill: 08/15/22 #90 and 0RF   Please Advise

## 2022-10-16 ENCOUNTER — Other Ambulatory Visit: Payer: Self-pay | Admitting: Family Medicine

## 2022-10-23 ENCOUNTER — Encounter (HOSPITAL_BASED_OUTPATIENT_CLINIC_OR_DEPARTMENT_OTHER): Payer: Self-pay

## 2022-10-23 ENCOUNTER — Ambulatory Visit (HOSPITAL_BASED_OUTPATIENT_CLINIC_OR_DEPARTMENT_OTHER)
Admission: RE | Admit: 2022-10-23 | Discharge: 2022-10-23 | Disposition: A | Discharge status: Home / Self Care | Payer: PPO | Source: Ambulatory Visit | Attending: Family Medicine | Admitting: Family Medicine

## 2022-10-23 DIAGNOSIS — Z1231 Encounter for screening mammogram for malignant neoplasm of breast: Secondary | ICD-10-CM | POA: Insufficient documentation

## 2022-11-01 DIAGNOSIS — N951 Menopausal and female climacteric states: Secondary | ICD-10-CM | POA: Diagnosis not present

## 2022-11-01 DIAGNOSIS — E038 Other specified hypothyroidism: Secondary | ICD-10-CM | POA: Diagnosis not present

## 2022-11-01 DIAGNOSIS — D509 Iron deficiency anemia, unspecified: Secondary | ICD-10-CM | POA: Diagnosis not present

## 2022-11-01 DIAGNOSIS — E782 Mixed hyperlipidemia: Secondary | ICD-10-CM | POA: Diagnosis not present

## 2022-11-05 ENCOUNTER — Encounter: Payer: Self-pay | Admitting: Family Medicine

## 2022-11-06 DIAGNOSIS — N951 Menopausal and female climacteric states: Secondary | ICD-10-CM | POA: Diagnosis not present

## 2022-11-06 DIAGNOSIS — Z79890 Hormone replacement therapy (postmenopausal): Secondary | ICD-10-CM | POA: Diagnosis not present

## 2022-11-06 DIAGNOSIS — M2550 Pain in unspecified joint: Secondary | ICD-10-CM | POA: Diagnosis not present

## 2022-11-06 DIAGNOSIS — Z6833 Body mass index (BMI) 33.0-33.9, adult: Secondary | ICD-10-CM | POA: Diagnosis not present

## 2022-11-06 DIAGNOSIS — G4700 Insomnia, unspecified: Secondary | ICD-10-CM | POA: Diagnosis not present

## 2022-12-03 ENCOUNTER — Encounter: Payer: Self-pay | Admitting: Family Medicine

## 2022-12-03 ENCOUNTER — Ambulatory Visit (INDEPENDENT_AMBULATORY_CARE_PROVIDER_SITE_OTHER): Payer: PPO | Admitting: Family Medicine

## 2022-12-03 VITALS — BP 126/74 | HR 88 | Temp 97.5°F | Resp 16 | Ht 68.0 in | Wt 216.8 lb

## 2022-12-03 DIAGNOSIS — E785 Hyperlipidemia, unspecified: Secondary | ICD-10-CM | POA: Diagnosis not present

## 2022-12-03 DIAGNOSIS — Z780 Asymptomatic menopausal state: Secondary | ICD-10-CM

## 2022-12-03 DIAGNOSIS — F418 Other specified anxiety disorders: Secondary | ICD-10-CM

## 2022-12-03 DIAGNOSIS — E559 Vitamin D deficiency, unspecified: Secondary | ICD-10-CM

## 2022-12-03 DIAGNOSIS — E2839 Other primary ovarian failure: Secondary | ICD-10-CM | POA: Diagnosis not present

## 2022-12-03 DIAGNOSIS — R739 Hyperglycemia, unspecified: Secondary | ICD-10-CM | POA: Diagnosis not present

## 2022-12-03 DIAGNOSIS — E061 Subacute thyroiditis: Secondary | ICD-10-CM | POA: Diagnosis not present

## 2022-12-03 DIAGNOSIS — Z0000 Encounter for general adult medical examination without abnormal findings: Secondary | ICD-10-CM

## 2022-12-03 DIAGNOSIS — Z6836 Body mass index (BMI) 36.0-36.9, adult: Secondary | ICD-10-CM

## 2022-12-03 DIAGNOSIS — Z79890 Hormone replacement therapy (postmenopausal): Secondary | ICD-10-CM

## 2022-12-03 DIAGNOSIS — I10 Essential (primary) hypertension: Secondary | ICD-10-CM | POA: Diagnosis not present

## 2022-12-03 NOTE — Assessment & Plan Note (Signed)
Continue to monitor

## 2022-12-03 NOTE — Progress Notes (Signed)
Subjective:   By signing my name below, I, [name], attest that this documentation has been prepared under the direction and in the presence of [name], MD., 12/03/2022.   Patient ID: Alexandra Patterson, female    DOB: 1956-04-01, 67 y.o.   MRN: [id number]  No chief complaint on file.  HPI Patient is in today for a comprehensive physical exam and follow up on chronic medical concerns. She denies CP/palpitations/SOB/HA/congestion/fever/ chills/GI or GU symptoms.  Family History Her 17 yo daughter has recently been diagnosed with intracranial hypertension.  Supplements She continues taking vitamin D 2000 IU daily.  Weight Loss She reports that she has recently lost 60 lbs through dietary changes and hormone replacement therapy. She is consuming 400-500 calories daily, eating lots of protein and minimum fats/sugar, and has been receiving testosterone pellets at [facility] every 3 months. Body mass index is 32.96 kg/m. Wt Readings from Last 3 Encounters:  12/03/22 216 lb 12.8 oz (98.3 kg)  06/11/22 258 lb (117 kg)  05/28/22 261 lb 12.8 oz (118.8 kg)   Lab Results  Component Value Date   HGBA1C 5.7 05/28/2022   Past Medical History:  Diagnosis Date   Allergy    Anxiety    Aortic stenosis    Atrial fibrillation (HCC)    Back pain    Bilateral swelling of feet    Bursitis    Cervical cancer screening 09/07/2015   Menarche at 11 Irregular and heavy and painful flow secondary to fibroids No history of abnormal pap in past, last pap roughly 2003 G2P2, s/p 2 SVD history of abnormal MGM, 1 abnl bx right breast benign, normal otherwise Noconcerns today TAH b/l SPO for fibroids and migrainesand breast bx on right gyn surgeries   Constipation    Cough 08/13/2015   Depression    Depression with anxiety 06/29/2016   Fainting    once to due heart out of rythm   Fibromyalgia    Frequent headaches    GERD (gastroesophageal reflux disease)    Hair loss disorder 06/03/2017    Heartburn    History of blood clots    History of chicken pox    Hx of blood clots    dvt   Hyperlipidemia    Hyperplastic colon polyp    Hypertension    Hypothyroid    Insomnia 08/13/2015   Internal hemorrhoids    Interstitial cystitis    Joint pain    Lesion of lung    xray in 2014 thought to be benign seen at [facility] pulmonology   Measles    h/o   Migraine    Obesity    Osteoarthritis    Preventative health care 02/03/2017   Raynaud's disease    S/P AVR (aortic valve replacement) 03/19/2015   Shoulder pain 06/03/2017   UTI (lower urinary tract infection)    UTI (urinary tract infection) 02/11/2016    Past Surgical History:  Procedure Laterality Date   ABDOMINAL HYSTERECTOMY     TAH SPO   AORTIC VALVE REPLACEMENT  2014   with [facility]   ATRIAL ABLATION SURGERY     BLADDER SUSPENSION     BREAST BIOPSY Right    Needle Biopsy   COLONOSCOPY WITH PROPOFOL N/A 12/28/2020   Procedure: COLONOSCOPY WITH PROPOFOL;  Surgeon: Virgel Manifold, MD;  Location: [facility] ENDOSCOPY;  Service: Endoscopy;  Laterality: N/A;   CORONARY ARTERY BYPASS GRAFT     DILATION AND CURETTAGE OF UTERUS  x 3   ESOPHAGOGASTRODUODENOSCOPY (EGD) WITH PROPOFOL N/A 12/28/2020   Procedure: ESOPHAGOGASTRODUODENOSCOPY (EGD) WITH PROPOFOL;  Surgeon: Virgel Manifold, MD;  Location: [facility] ENDOSCOPY;  Service: Endoscopy;  Laterality: N/A;   loop heart     SHOULDER SURGERY Left    arthroscopy for spurs   TONSILLECTOMY     TUBAL LIGATION      Family History  Problem Relation Age of Onset   Colon polyps Father    Alzheimer's disease Father    Alcohol abuse Father    Cancer Father        colon cancer   Arthritis Mother        s/p TKR   Neuropathy Mother    Hyperlipidemia Mother    Other Mother        familial mediterranean fever   Thyroid disease Mother    Hypertension Mother    Obesity Mother    Heart disease Mother    Cancer Brother        prostate cancer   Heart disease Brother         cardiomegaly   Other Brother        familial mediterranean fever   Asthma Maternal Grandmother    Congestive Heart Failure Maternal Grandmother    Heart disease Maternal Grandmother        chf   Stroke Maternal Grandfather    Diabetes Maternal Grandfather    Heart disease Maternal Grandfather        hardening of the arteries   Stroke Paternal Grandfather    Atrial fibrillation Paternal Grandfather    Alcohol abuse Paternal Grandfather    Heart disease Paternal Grandfather        afib   Interstitial cystitis Daughter    Arthritis Son    Alcohol abuse Son        in remission   Mental illness Son        depression   Other Son        interstitial cystitis   Colon cancer Other        parent   Arthritis Paternal Uncle     Social History   Socioeconomic History   Marital status: Married    Spouse name: Not on file   Number of children: 2   Years of education: Not on file   Highest education level: Bachelor's degree (e.g., BA, AB, BS)  Occupational History   Occupation: Best boy: [employer]  Tobacco Use   Smoking status: Never   Smokeless tobacco: Never  Substance and Sexual Activity   Alcohol use: No   Drug use: No   Sexual activity: Yes    Comment: lives with husband and adult son, no major dietary restrictions, full diability  Other Topics Concern   Not on file  Social History Narrative   Not on file   Social Determinants of Health   Financial Resource Strain: Low Risk  (07/18/2021)   Overall Financial Resource Strain (CARDIA)    Difficulty of Paying Living Expenses: Not hard at all  Food Insecurity: No Food Insecurity (07/18/2021)   Hunger Vital Sign    Worried About Running Out of Food in the Last Year: Never true    [place] in the Last Year: Never true  Transportation Needs: No Transportation Needs (07/18/2021)   PRAPARE - Hydrologist (Medical): No    Lack of Transportation (Non-Medical): No   Physical Activity: Unknown (  07/18/2021)   Exercise Vital Sign    Days of Exercise per Week: 0 days    Minutes of Exercise per Session: Not on file  Recent Concern: Physical Activity - Inactive (07/18/2021)   Exercise Vital Sign    Days of Exercise per Week: 0 days    Minutes of Exercise per Session: 0 min  Stress: No Stress Concern Present (07/18/2021)   Pax    Feeling of Stress : Only a little  Social Connections: Moderately Integrated (07/18/2021)   Social Connection and Isolation Panel [NHANES]    Frequency of Communication with Friends and Family: Three times a week    Frequency of Social Gatherings with Friends and Family: Once a week    Attends Religious Services: More than 4 times per year    Active Member of [employer] or Organizations: No    Attends Music therapist: Not on file    Marital Status: Married  Human resources officer Violence: Not At Risk (04/24/2021)   Humiliation, Afraid, Rape, and Kick questionnaire    Fear of Current or Ex-Partner: No    Emotionally Abused: No    Physically Abused: No    Sexually Abused: No    Outpatient Medications Prior to Visit  Medication Sig Dispense Refill   amitriptyline (ELAVIL) 25 MG tablet Take 1 tablet (25 mg total) by mouth at bedtime.     busPIRone (BUSPAR) 10 MG tablet TAKE ONE TABLET BY MOUTH EVERY MORNING and TAKE ONE TABLET BY MOUTH EVERYDAY AT BEDTIME 180 tablet 1   cyclobenzaprine (FLEXERIL) 5 MG tablet Take 5 mg by mouth 3 (three) times daily as needed for muscle spasms.     DULoxetine (CYMBALTA) 60 MG capsule TAKE TWO CAPSULES BY MOUTH EVERY MORNING 180 capsule 1   esomeprazole (NEXIUM) 40 MG capsule Take 40 mg by mouth 2 (two) times daily before a meal.      hydrOXYzine (ATARAX/VISTARIL) 25 MG tablet Take 1 tablet (25 mg total) by mouth at bedtime as needed. 90 tablet 1   levothyroxine (SYNTHROID) 125 MCG tablet TAKE ONE TABLET BY MOUTH ONCE  WEEKLY BEFORE BREAKFAST ON MONDAY and TAKE ONE TABLET BY MOUTH ONCE WEEKLY BEFORE BREAKFAST ON  TUESDAYS and TAKE ONE TABLET BY MOUTH ONCE WEEKLY BEFORE BREAKFAST ON WEDNESDAYS and TAKE ONE TABLET BY MOUTH ONCE WEEKLY BEFORE BREAKFAST ON THURSDAYS and TAKE ONE TABLET BY MOUTH ONCE WEEKLY BEFORE BREAKFAST ON FRIDAYS and TAKE ONE TABLET BY MOUTH ONCE WEEKLY BEFORE BREAKFAST ON SATURDAY 78 tablet 1   losartan (COZAAR) 100 MG tablet TAKE ONE TABLET BY MOUTH EVERY MORNING 90 tablet 1   Magnesium 400 MG CAPS Take 400 mg by mouth daily. Reported on 02/13/2016     meloxicam (MOBIC) 15 MG tablet TAKE ONE TABLET BY MOUTH EVERY MORNING 90 tablet 1   Methen-Hyosc-Meth Blue-Na Phos (UROGESIC-BLUE) 81.6 MG TABS Take 1 tablet (81.6 mg total) by mouth 4 (four) times daily as needed.     metoprolol succinate (TOPROL-XL) 100 MG 24 hr tablet TAKE ONE TABLET BY MOUTH EVERY MORNING 90 tablet 1   metoprolol succinate (TOPROL-XL) 50 MG 24 hr tablet TAKE ONE TABLET BY MOUTH EVERY MORNING 90 tablet 1   ondansetron (ZOFRAN) 4 MG tablet Take 4 mg by mouth every 8 (eight) hours as needed for nausea or vomiting.     Oxycodone HCl 10 MG TABS Take 1 tablet (10 mg total) by mouth 3 (three) times daily as needed. 90 tablet  0   rosuvastatin (CRESTOR) 5 MG tablet TAKE ONE TABLET BY MOUTH EVERYDAY AT BEDTIME 90 tablet 0   zonisamide (ZONEGRAN) 100 MG capsule Take 200 mg by mouth 2 (two) times daily.     No facility-administered medications prior to visit.    Allergies  Allergen Reactions   Ciprofloxacin Rash and Shortness Of Breath   Sulfa Antibiotics Hives   Sulfasalazine Hives   Cortisone Other (See Comments)    Red flush and anxiety   Tramadol Other (See Comments)    headache   Zonisamide Other (See Comments)    unk   Erythromycin Rash   Erythromycin Base Rash   Levofloxacin Nausea Only   Lisinopril     Other reaction(s): Cough (ALLERGY/intolerance)   Prednisone Rash and Anxiety    Review of Systems   Constitutional:  Negative for chills and fever.  HENT:  Negative for congestion.   Respiratory:  Negative for shortness of breath.   Cardiovascular:  Negative for chest pain and palpitations.  Gastrointestinal:  Negative for abdominal pain, blood in stool, constipation, diarrhea, nausea and vomiting.  Genitourinary:  Negative for dysuria, frequency, hematuria and urgency.  Skin:           Neurological:  Negative for headaches.       Objective:    Physical Exam Constitutional:      General: She is not in acute distress.    Appearance: Normal appearance. She is normal weight. She is not ill-appearing.  HENT:     Head: Normocephalic and atraumatic.     Right Ear: Tympanic membrane, ear canal and external ear normal.     Left Ear: Tympanic membrane, ear canal and external ear normal.     Nose: Nose normal.     Mouth/Throat:     Mouth: Mucous membranes are moist.     Pharynx: Oropharynx is clear.  Eyes:     General:        Right eye: No discharge.        Left eye: No discharge.     Extraocular Movements: Extraocular movements intact.     Right eye: No nystagmus.     Left eye: No nystagmus.     Pupils: Pupils are equal, round, and reactive to light.  Neck:     Vascular: No carotid bruit.  Cardiovascular:     Rate and Rhythm: Normal rate and regular rhythm.     Pulses: Normal pulses.     Heart sounds: Normal heart sounds. No murmur heard.    No gallop.  Pulmonary:     Effort: Pulmonary effort is normal. No respiratory distress.     Breath sounds: Normal breath sounds. No wheezing or rales.  Abdominal:     General: Bowel sounds are normal.     Palpations: Abdomen is soft.     Tenderness: There is no abdominal tenderness. There is no guarding.  Musculoskeletal:        General: Normal range of motion.     Cervical back: Normal range of motion.     Right lower leg: No edema.     Left lower leg: No edema.     Comments: Muscle strength 5/5 on upper and lower extremities.    Lymphadenopathy:     Cervical: No cervical adenopathy.  Skin:    General: Skin is warm and dry.  Neurological:     Mental Status: She is alert and oriented to person, place, and time.     Sensory: Sensation is intact.  Motor: Motor function is intact.     Coordination: Coordination is intact.     Deep Tendon Reflexes:     Reflex Scores:      Patellar reflexes are 1+ on the right side and 1+ on the left side. Psychiatric:        Mood and Affect: Mood normal.        Behavior: Behavior normal.        Judgment: Judgment normal.     There were no vitals taken for this visit. Wt Readings from Last 3 Encounters:  06/11/22 258 lb (117 kg)  05/28/22 261 lb 12.8 oz (118.8 kg)  04/30/22 268 lb 6.4 oz (121.7 kg)    Diabetic Foot Exam - Simple   No data filed    Lab Results  Component Value Date   WBC 8.0 05/28/2022   HGB 14.0 05/28/2022   HCT 43.0 05/28/2022   PLT 256.0 05/28/2022   GLUCOSE 92 05/28/2022   CHOL 170 05/28/2022   TRIG 104.0 05/28/2022   HDL 57.60 05/28/2022   LDLDIRECT 103.0 06/03/2017   LDLCALC 91 05/28/2022   ALT 13 05/28/2022   AST 13 05/28/2022   NA 137 05/28/2022   K 4.9 05/28/2022   CL 101 05/28/2022   CREATININE 1.00 05/28/2022   BUN 20 05/28/2022   CO2 25 05/28/2022   TSH 0.88 05/28/2022   HGBA1C 5.7 05/28/2022    Lab Results  Component Value Date   TSH 0.88 05/28/2022   Lab Results  Component Value Date   WBC 8.0 05/28/2022   HGB 14.0 05/28/2022   HCT 43.0 05/28/2022   MCV 86.6 05/28/2022   PLT 256.0 05/28/2022   Lab Results  Component Value Date   NA 137 05/28/2022   K 4.9 05/28/2022   CO2 25 05/28/2022   GLUCOSE 92 05/28/2022   BUN 20 05/28/2022   CREATININE 1.00 05/28/2022   BILITOT 0.4 05/28/2022   ALKPHOS 81 05/28/2022   AST 13 05/28/2022   ALT 13 05/28/2022   PROT 6.4 05/28/2022   ALBUMIN 4.3 05/28/2022   CALCIUM 9.7 05/28/2022   GFR 58.46 (L) 05/28/2022   Lab Results  Component Value Date   CHOL 170  05/28/2022   Lab Results  Component Value Date   HDL 57.60 05/28/2022   Lab Results  Component Value Date   LDLCALC 91 05/28/2022   Lab Results  Component Value Date   TRIG 104.0 05/28/2022   Lab Results  Component Value Date   CHOLHDL 3 05/28/2022   Lab Results  Component Value Date   HGBA1C 5.7 05/28/2022      Assessment & Plan:  Colonoscopy: Last completed on 12/28/2020. Impression: -One 8 mm polyp in the cecum, removed with a cold snare. Resected and retrieved. -The examination was otherwise normal. Repeat in 3 years.  DEXA: Last completed on 07/24/2017. The BMD measured at Femur Neck Right is 1.026 g/cm2 with a T-score of -0.1. This patient is considered NORMAL according to [facility]) criteria. Order placed.  Mammogram: Last completed on 10/23/2022 with no mammographic evidence of malignancy. Repeat in 1-2 years.  Pap Smear: Last completed on 05/18/2019 with normal results. Repeat in 3-5 years.  Advanced Directives: Encouraged patient to complete advanced care planning documents.  Healthy Lifestyle: Encouraged 6-8 hours of sleep, heart healthy diet, 40-60 oz of non-alcohol/non-caffeinated fluids, and minimum of 4000 steps daily.  Immunizations: Encouraged annual COVID-19 and Flu vaccinations as well as RSV vaccination at pharmacy. Tetanus due in  2026 unless injured before then.  Problem List Items Addressed This Visit   None  No orders of the defined types were placed in this encounter.  I, [name], personally preformed the services described in this documentation.  All medical record entries made by the scribe were at my direction and in my presence.  I have reviewed the chart and discharge instructions (if applicable) and agree that the record reflects my personal performance and is accurate and complete. 12/03/2022  I,Mohammed Iqbal,acting as a scribe for Penni Homans, MD.,have documented all relevant documentation on the behalf of  Penni Homans, MD,as directed by  Penni Homans, MD while in the presence of Penni Homans, MD.  [name]

## 2022-12-03 NOTE — Patient Instructions (Addendum)
4000 steps, 8000 steps a day 60-80 ounces of fluids daily 6-8 hours sleep Tetanus in 2026 unless injured then take early Annual flu and covid booster RSV, Respiratory Syncitial Virus vaccine, Arexvy at [place] 65 Years and Older, Female Preventive care refers to lifestyle choices and visits with your health care provider that can promote health and wellness. Preventive care visits are also called wellness exams. What can I expect for my preventive care visit? Counseling Your health care provider may ask you questions about your: Medical history, including: Past medical problems. Family medical history. Pregnancy and menstrual history. History of falls. Current health, including: Memory and ability to understand (cognition). Emotional well-being. Home life and relationship well-being. Sexual activity and sexual health. Lifestyle, including: Alcohol, nicotine or tobacco, and drug use. Access to firearms. Diet, exercise, and sleep habits. Work and work Statistician. Sunscreen use. Safety issues such as seatbelt and bike helmet use. Physical exam Your health care provider will check your: Height and weight. These may be used to calculate your BMI (body mass index). BMI is a measurement that tells if you are at a healthy weight. Waist circumference. This measures the distance around your waistline. This measurement also tells if you are at a healthy weight and may help predict your risk of certain diseases, such as type 2 diabetes and high blood pressure. Heart rate and blood pressure. Body temperature. Skin for abnormal spots. What immunizations do I need?  Vaccines are usually given at various ages, according to a schedule. Your health care provider will recommend vaccines for you based on your age, medical history, and lifestyle or other factors, such as travel or where you work. What tests do I need? Screening Your health care provider may recommend screening  tests for certain conditions. This may include: Lipid and cholesterol levels. Hepatitis C test. Hepatitis B test. HIV (human immunodeficiency virus) test. STI (sexually transmitted infection) testing, if you are at risk. Lung cancer screening. Colorectal cancer screening. Diabetes screening. This is done by checking your blood sugar (glucose) after you have not eaten for a while (fasting). Mammogram. Talk with your health care provider about how often you should have regular mammograms. BRCA-related cancer screening. This may be done if you have a family history of breast, ovarian, tubal, or peritoneal cancers. Bone density scan. This is done to screen for osteoporosis. Talk with your health care provider about your test results, treatment options, and if necessary, the need for more tests. Follow these instructions at home: Eating and drinking  Eat a diet that includes fresh fruits and vegetables, whole grains, lean protein, and low-fat dairy products. Limit your intake of foods with high amounts of sugar, saturated fats, and salt. Take vitamin and mineral supplements as recommended by your health care provider. Do not drink alcohol if your health care provider tells you not to drink. If you drink alcohol: Limit how much you have to 0-1 drink a day. Know how much alcohol is in your drink. In the [place] one drink equals one 12 oz bottle of beer (355 mL), one 5 oz glass of wine (148 mL), or one 1 oz glass of hard liquor (44 mL). Lifestyle Brush your teeth every morning and night with fluoride toothpaste. Floss one time each day. Exercise for at least 30 minutes 5 or more days each week. Do not use any products that contain nicotine or tobacco. These products include cigarettes, chewing tobacco, and vaping devices, such as e-cigarettes. If you need help quitting,  ask your health care provider. Do not use drugs. If you are sexually active, practice safe sex. Use a condom or other form of  protection in order to prevent STIs. Take aspirin only as told by your health care provider. Make sure that you understand how much to take and what form to take. Work with your health care provider to find out whether it is safe and beneficial for you to take aspirin daily. Ask your health care provider if you need to take a cholesterol-lowering medicine (statin). Find healthy ways to manage stress, such as: Meditation, yoga, or listening to music. Journaling. Talking to a trusted person. Spending time with friends and family. Minimize exposure to UV radiation to reduce your risk of skin cancer. Safety Always wear your seat belt while driving or riding in a vehicle. Do not drive: If you have been drinking alcohol. Do not ride with someone who has been drinking. When you are tired or distracted. While texting. If you have been using any mind-altering substances or drugs. Wear a helmet and other protective equipment during sports activities. If you have firearms in your house, make sure you follow all gun safety procedures. What's next? Visit your health care provider once a year for an annual wellness visit. Ask your health care provider how often you should have your eyes and teeth checked. Stay up to date on all vaccines. This information is not intended to replace advice given to you by your health care provider. Make sure you discuss any questions you have with your health care provider. Document Revised: 03/14/2021 Document Reviewed: 03/14/2021 Elsevier Patient Education  [place].

## 2022-12-03 NOTE — Assessment & Plan Note (Signed)
She sees [facility] MD and they give her hormone pellets every 3 months that include Testosterone. She has had good weight loss and energy improvements.

## 2022-12-03 NOTE — Assessment & Plan Note (Signed)
Well controlled, no changes to meds. Encouraged heart healthy diet such as the DASH diet and exercise as tolerated.  °

## 2022-12-03 NOTE — Assessment & Plan Note (Signed)
Supplement and monitor 

## 2022-12-03 NOTE — Assessment & Plan Note (Signed)
Encourage heart healthy diet such as MIND or DASH diet, increase exercise, avoid trans fats, simple carbohydrates and processed foods, consider a krill or fish or flaxseed oil cap daily.  °

## 2022-12-03 NOTE — Assessment & Plan Note (Signed)
Patient encouraged to maintain heart healthy diet, regular exercise, adequate sleep. Consider daily probiotics. Take medications as prescribed. Labs ordered and reviewed. Given and reviewed copy of ACP documents from [place] Secretary of State and encouraged to complete and return 

## 2022-12-04 ENCOUNTER — Other Ambulatory Visit: Payer: Self-pay

## 2022-12-04 LAB — CBC WITH DIFFERENTIAL/PLATELET
Basophils Absolute: 0.1 10*3/uL (ref 0.0–0.1)
Basophils Relative: 1.5 % (ref 0.0–3.0)
Eosinophils Absolute: 0.1 10*3/uL (ref 0.0–0.7)
Eosinophils Relative: 1 % (ref 0.0–5.0)
HCT: 44 % (ref 36.0–46.0)
Hemoglobin: 14.6 g/dL (ref 12.0–15.0)
Lymphocytes Relative: 19.1 % (ref 12.0–46.0)
Lymphs Abs: 1.7 10*3/uL (ref 0.7–4.0)
MCHC: 33.1 g/dL (ref 30.0–36.0)
MCV: 88.8 fl (ref 78.0–100.0)
Monocytes Absolute: 0.4 10*3/uL (ref 0.1–1.0)
Monocytes Relative: 4.5 % (ref 3.0–12.0)
Neutro Abs: 6.8 10*3/uL (ref 1.4–7.7)
Neutrophils Relative %: 73.9 % (ref 43.0–77.0)
Platelets: 294 10*3/uL (ref 150.0–400.0)
RBC: 4.96 Mil/uL (ref 3.87–5.11)
RDW: 17.3 % — ABNORMAL HIGH (ref 11.5–15.5)
WBC: 9.2 10*3/uL (ref 4.0–10.5)

## 2022-12-04 LAB — COMPREHENSIVE METABOLIC PANEL
ALT: 19 U/L (ref 0–35)
AST: 17 U/L (ref 0–37)
Albumin: 4.1 g/dL (ref 3.5–5.2)
Alkaline Phosphatase: 84 U/L (ref 39–117)
BUN: 25 mg/dL — ABNORMAL HIGH (ref 6–23)
CO2: 25 mEq/L (ref 19–32)
Calcium: 10.3 mg/dL (ref 8.4–10.5)
Chloride: 102 mEq/L (ref 96–112)
Creatinine, Ser: 0.88 mg/dL (ref 0.40–1.20)
GFR: 67.91 mL/min (ref >60.00)
Glucose, Bld: 88 mg/dL (ref 70–99)
Potassium: 5.1 mEq/L (ref 3.5–5.1)
Sodium: 138 mEq/L (ref 135–145)
Total Bilirubin: 0.5 mg/dL (ref 0.2–1.2)
Total Protein: 6.3 g/dL (ref 6.0–8.3)

## 2022-12-04 LAB — LIPID PANEL
Cholesterol: 154 mg/dL (ref 0–200)
HDL: 51.5 mg/dL (ref >39.00)
LDL Cholesterol: 81 mg/dL (ref 0–99)
NonHDL: 102.78
Total CHOL/HDL Ratio: 3
Triglycerides: 108 mg/dL (ref 0.0–149.0)
VLDL: 21.6 mg/dL (ref 0.0–40.0)

## 2022-12-04 LAB — VITAMIN D 25 HYDROXY (VIT D DEFICIENCY, FRACTURES): VITD: 99.16 ng/mL (ref 30.00–100.00)

## 2022-12-04 LAB — HEMOGLOBIN A1C: Hgb A1c MFr Bld: 5.5 % (ref 4.6–6.5)

## 2022-12-04 LAB — TSH: TSH: 0.26 u[IU]/mL — ABNORMAL LOW (ref 0.35–5.50)

## 2022-12-04 NOTE — Assessment & Plan Note (Signed)
Stable on current meds 

## 2022-12-04 NOTE — Assessment & Plan Note (Signed)
>>  ASSESSMENT AND PLAN FOR CLASS 2 SEVERE OBESITY WITH SERIOUS COMORBIDITY AND BODY MASS INDEX (BMI) OF 36.0 TO 36.9 IN ADULT, UNSPECIFIED OBESITY TYPE (HCC) WRITTEN ON 12/04/2022  9:04 PM BY BLYTH, STACEY A, MD  Good weight loss with portion control and testosterone  treatments.

## 2022-12-04 NOTE — Assessment & Plan Note (Signed)
Good weight loss with portion control and testosterone treatments.

## 2022-12-04 NOTE — Assessment & Plan Note (Signed)
hgba1c acceptable, minimize simple carbs. Increase exercise as tolerated.  

## 2022-12-10 ENCOUNTER — Ambulatory Visit (HOSPITAL_BASED_OUTPATIENT_CLINIC_OR_DEPARTMENT_OTHER)
Admission: RE | Admit: 2022-12-10 | Discharge: 2022-12-10 | Disposition: A | Discharge status: Home / Self Care | Payer: PPO | Source: Ambulatory Visit | Attending: Family Medicine | Admitting: Family Medicine

## 2022-12-10 DIAGNOSIS — E2839 Other primary ovarian failure: Secondary | ICD-10-CM | POA: Diagnosis not present

## 2022-12-10 DIAGNOSIS — Z780 Asymptomatic menopausal state: Secondary | ICD-10-CM | POA: Insufficient documentation

## 2022-12-29 ENCOUNTER — Other Ambulatory Visit: Payer: Self-pay | Admitting: Family Medicine

## 2022-12-30 MED ORDER — OXYCODONE HCL 10 MG PO TABS: 10.0000 mg | ORAL_TABLET | Freq: Three times a day (TID) | ORAL | 0 refills | Status: DC | PRN

## 2022-12-30 NOTE — Telephone Encounter (Signed)
Requesting: Oxycodone 10 mg  Contract: 05/28/2022 UDS: 05/28/2022 Last Visit: 12/03/2022 Next Visit: 06/05/2023 Last Refill: 10/14/2022 #90 no refills  Please Advise

## 2023-01-08 ENCOUNTER — Other Ambulatory Visit: Payer: Self-pay | Admitting: Family Medicine

## 2023-01-09 DIAGNOSIS — E063 Autoimmune thyroiditis: Secondary | ICD-10-CM | POA: Diagnosis not present

## 2023-01-09 DIAGNOSIS — E038 Other specified hypothyroidism: Secondary | ICD-10-CM | POA: Diagnosis not present

## 2023-01-29 DIAGNOSIS — N951 Menopausal and female climacteric states: Secondary | ICD-10-CM | POA: Diagnosis not present

## 2023-01-31 DIAGNOSIS — G4700 Insomnia, unspecified: Secondary | ICD-10-CM | POA: Diagnosis not present

## 2023-01-31 DIAGNOSIS — Z6829 Body mass index (BMI) 29.0-29.9, adult: Secondary | ICD-10-CM | POA: Diagnosis not present

## 2023-01-31 DIAGNOSIS — Z79890 Hormone replacement therapy (postmenopausal): Secondary | ICD-10-CM | POA: Diagnosis not present

## 2023-01-31 DIAGNOSIS — M2550 Pain in unspecified joint: Secondary | ICD-10-CM | POA: Diagnosis not present

## 2023-01-31 DIAGNOSIS — N951 Menopausal and female climacteric states: Secondary | ICD-10-CM | POA: Diagnosis not present

## 2023-02-23 ENCOUNTER — Encounter: Payer: Self-pay | Admitting: Family Medicine

## 2023-03-06 ENCOUNTER — Telehealth: Payer: Self-pay

## 2023-03-06 NOTE — Patient Outreach (Signed)
  Care Coordination   Initial Visit Note   03/06/2023 Name: Alexandra Patterson MRN: [id number] DOB: 1956/03/15  Alexandra Patterson is a 67 y.o. year old female who sees Alexandra Canary, MD for primary care. I spoke with  Alexandra Patterson by phone today.  What matters to the patients health and wellness today?  She reports she has lost 85 pounds which has helped control health conditions. Alexandra Patterson denies any questions today. She denies any disease management, care coordination or resource needs at this time. Alexandra Patterson denies any quesitons or concerns today.  Goals Addressed             This Visit's Progress    COMPLETED: Care Coordination Activities       Interventions Today    Flowsheet Row Most Recent Value  Chronic Disease   Chronic disease during today's visit Diabetes, Hypertension (HTN)  General Interventions   General Interventions Discussed/Reviewed General Interventions Discussed, Doctor Visits  [facility] provided contact number and encouraged to contact for care coordination needs as needed.]  Doctor Visits Discussed/Reviewed PCP, Doctor Visits Discussed, Specialist  [upcoming appointments reviewed with patient]  PCP/Specialist Visits Compliance with follow-up visit  Education Interventions   Education Provided Provided Education  Provided Verbal Education On Blood Sugar Monitoring, Medication, When to see the doctor  [advised to continue to take medications as prescribed, attend provider visits as scheduled.]  Nutrition Interventions   Nutrition Discussed/Reviewed Nutrition Discussed  Pharmacy Interventions   Pharmacy Dicussed/Reviewed Pharmacy Topics Reviewed            SDOH assessments and interventions completed:  Yes  SDOH Interventions Today    Flowsheet Row Most Recent Value  SDOH Interventions   Food Insecurity Interventions Intervention Not Indicated  Housing Interventions Intervention Not Indicated  Transportation Interventions Intervention Not Indicated  Utilities  Interventions Intervention Not Indicated     Care Coordination Interventions:  Yes, provided   Follow up plan: No further intervention required.   Encounter Outcome:  Pt. Visit Completed   Alexandra Sheriff, RN, MSN, BSN, CCM [facility] Care Coordinator [phone]

## 2023-03-24 ENCOUNTER — Other Ambulatory Visit: Payer: Self-pay | Admitting: Family Medicine

## 2023-03-25 MED ORDER — OXYCODONE HCL 10 MG PO TABS: 10.0000 mg | ORAL_TABLET | Freq: Three times a day (TID) | ORAL | 0 refills | Status: DC | PRN

## 2023-03-25 NOTE — Telephone Encounter (Signed)
Requesting: oxycodone 10mg   Contract: 05/28/22 UDS: 05/28/22 Last Visit: 12/03/22 Next Visit: 06/05/23 Last Refill: 12/30/22 #90 and 0RF   Please Advise

## 2023-04-03 ENCOUNTER — Other Ambulatory Visit: Payer: Self-pay | Admitting: Family Medicine

## 2023-04-14 DIAGNOSIS — G43009 Migraine without aura, not intractable, without status migrainosus: Secondary | ICD-10-CM | POA: Diagnosis not present

## 2023-04-21 DIAGNOSIS — N3010 Interstitial cystitis (chronic) without hematuria: Secondary | ICD-10-CM | POA: Diagnosis not present

## 2023-04-21 DIAGNOSIS — N3941 Urge incontinence: Secondary | ICD-10-CM | POA: Diagnosis not present

## 2023-04-21 DIAGNOSIS — K5909 Other constipation: Secondary | ICD-10-CM | POA: Diagnosis not present

## 2023-04-21 DIAGNOSIS — R351 Nocturia: Secondary | ICD-10-CM | POA: Diagnosis not present

## 2023-04-22 DIAGNOSIS — K5909 Other constipation: Secondary | ICD-10-CM | POA: Diagnosis not present

## 2023-05-02 ENCOUNTER — Ambulatory Visit: Payer: PPO | Admitting: *Deleted

## 2023-05-02 VITALS — BP 129/77 | HR 79 | Ht 68.0 in | Wt 181.4 lb

## 2023-05-02 DIAGNOSIS — Z0000 Encounter for general adult medical examination without abnormal findings: Secondary | ICD-10-CM

## 2023-05-02 NOTE — Patient Instructions (Signed)
Alexandra Patterson , Thank you for taking time to come for your Medicare Wellness Visit. I appreciate your ongoing commitment to your health goals. Please review the following plan we discussed and let me know if I can assist you in the future.     This is a list of the screening recommended for you and due dates:  Health Maintenance  Topic Date Due   COVID-19 Vaccine (5 - 2023-24 season) 05/31/2022   Flu Shot  05/01/2023   Colon Cancer Screening  12/29/2023   Medicare Annual Wellness Visit  05/01/2024   Mammogram  10/23/2024   DTaP/Tdap/Td vaccine (2 - Td or Tdap) 05/25/2025   Pneumonia Vaccine  Completed   DEXA scan (bone density measurement)  Completed   Zoster (Shingles) Vaccine  Completed   HPV Vaccine  Aged Out   Hepatitis C Screening  Discontinued    Next appointment: Follow up in one year for your annual wellness visit.   Preventive Care 68 Years and Older, Female Preventive care refers to lifestyle choices and visits with your health care provider that can promote health and wellness. What does preventive care include? A yearly physical exam. This is also called an annual well check. Dental exams once or twice a year. Routine eye exams. Ask your health care provider how often you should have your eyes checked. Personal lifestyle choices, including: Daily care of your teeth and gums. Regular physical activity. Eating a healthy diet. Avoiding tobacco and drug use. Limiting alcohol use. Practicing safe sex. Taking low-dose aspirin every day. Taking vitamin and mineral supplements as recommended by your health care provider. What happens during an annual well check? The services and screenings done by your health care provider during your annual well check will depend on your age, overall health, lifestyle risk factors, and family history of disease. Counseling  Your health care provider may ask you questions about your: Alcohol use. Tobacco use. Drug use. Emotional  well-being. Home and relationship well-being. Sexual activity. Eating habits. History of falls. Memory and ability to understand (cognition). Work and work Astronomer. Reproductive health. Screening  You may have the following tests or measurements: Height, weight, and BMI. Blood pressure. Lipid and cholesterol levels. These may be checked every 5 years, or more frequently if you are over 65 years old. Skin check. Lung cancer screening. You may have this screening every year starting at age 63 if you have a 30-pack-year history of smoking and currently smoke or have quit within the past 15 years. Fecal occult blood test (FOBT) of the stool. You may have this test every year starting at age 110. Flexible sigmoidoscopy or colonoscopy. You may have a sigmoidoscopy every 5 years or a colonoscopy every 10 years starting at age 61. Hepatitis C blood test. Hepatitis B blood test. Sexually transmitted disease (STD) testing. Diabetes screening. This is done by checking your blood sugar (glucose) after you have not eaten for a while (fasting). You may have this done every 1-3 years. Bone density scan. This is done to screen for osteoporosis. You may have this done starting at age 67. Mammogram. This may be done every 1-2 years. Talk to your health care provider about how often you should have regular mammograms. Talk with your health care provider about your test results, treatment options, and if necessary, the need for more tests. Vaccines  Your health care provider may recommend certain vaccines, such as: Influenza vaccine. This is recommended every year. Tetanus, diphtheria, and acellular pertussis (Tdap, Td) vaccine.  You may need a Td booster every 10 years. Zoster vaccine. You may need this after age 21. Pneumococcal 13-valent conjugate (PCV13) vaccine. One dose is recommended after age 20. Pneumococcal polysaccharide (PPSV23) vaccine. One dose is recommended after age 33. Talk to your  health care provider about which screenings and vaccines you need and how often you need them. This information is not intended to replace advice given to you by your health care provider. Make sure you discuss any questions you have with your health care provider. Document Released: 10/13/2015 Document Revised: 06/05/2016 Document Reviewed: 07/18/2015 Elsevier Interactive Patient Education  2017 [employer].  Fall Prevention in the Home Falls can cause injuries. They can happen to people of all ages. There are many things you can do to make your home safe and to help prevent falls. What can I do on the outside of my home? Regularly fix the edges of walkways and driveways and fix any cracks. Remove anything that might make you trip as you walk through a door, such as a raised step or threshold. Trim any bushes or trees on the path to your home. Use bright outdoor lighting. Clear any walking paths of anything that might make someone trip, such as rocks or tools. Regularly check to see if handrails are loose or broken. Make sure that both sides of any steps have handrails. Any raised decks and porches should have guardrails on the edges. Have any leaves, snow, or ice cleared regularly. Use sand or salt on walking paths during winter. Clean up any spills in your garage right away. This includes oil or grease spills. What can I do in the bathroom? Use night lights. Install grab bars by the toilet and in the tub and shower. Do not use towel bars as grab bars. Use non-skid mats or decals in the tub or shower. If you need to sit down in the shower, use a plastic, non-slip stool. Keep the floor dry. Clean up any water that spills on the floor as soon as it happens. Remove soap buildup in the tub or shower regularly. Attach bath mats securely with double-sided non-slip rug tape. Do not have throw rugs and other things on the floor that can make you trip. What can I do in the bedroom? Use night  lights. Make sure that you have a light by your bed that is easy to reach. Do not use any sheets or blankets that are too big for your bed. They should not hang down onto the floor. Have a firm chair that has side arms. You can use this for support while you get dressed. Do not have throw rugs and other things on the floor that can make you trip. What can I do in the kitchen? Clean up any spills right away. Avoid walking on wet floors. Keep items that you use a lot in easy-to-reach places. If you need to reach something above you, use a strong step stool that has a grab bar. Keep electrical cords out of the way. Do not use floor polish or wax that makes floors slippery. If you must use wax, use non-skid floor wax. Do not have throw rugs and other things on the floor that can make you trip. What can I do with my stairs? Do not leave any items on the stairs. Make sure that there are handrails on both sides of the stairs and use them. Fix handrails that are broken or loose. Make sure that handrails are as long as  the stairways. Check any carpeting to make sure that it is firmly attached to the stairs. Fix any carpet that is loose or worn. Avoid having throw rugs at the top or bottom of the stairs. If you do have throw rugs, attach them to the floor with carpet tape. Make sure that you have a light switch at the top of the stairs and the bottom of the stairs. If you do not have them, ask someone to add them for you. What else can I do to help prevent falls? Wear shoes that: Do not have high heels. Have rubber bottoms. Are comfortable and fit you well. Are closed at the toe. Do not wear sandals. If you use a stepladder: Make sure that it is fully opened. Do not climb a closed stepladder. Make sure that both sides of the stepladder are locked into place. Ask someone to hold it for you, if possible. Clearly mark and make sure that you can see: Any grab bars or handrails. First and last  steps. Where the edge of each step is. Use tools that help you move around (mobility aids) if they are needed. These include: Canes. Walkers. Scooters. Crutches. Turn on the lights when you go into a dark area. Replace any light bulbs as soon as they burn out. Set up your furniture so you have a clear path. Avoid moving your furniture around. If any of your floors are uneven, fix them. If there are any pets around you, be aware of where they are. Review your medicines with your doctor. Some medicines can make you feel dizzy. This can increase your chance of falling. Ask your doctor what other things that you can do to help prevent falls. This information is not intended to replace advice given to you by your health care provider. Make sure you discuss any questions you have with your health care provider. Document Released: 07/13/2009 Document Revised: 02/22/2016 Document Reviewed: 10/21/2014 Elsevier Interactive Patient Education  2017 [employer].

## 2023-05-02 NOTE — Progress Notes (Signed)
Subjective:   Alexandra Patterson is a 67 y.o. female who presents for Medicare Annual (Subsequent) preventive examination.  Visit Complete: In person   Review of Systems     Cardiac Risk Factors include: advanced age (>57men, >42 women);dyslipidemia;hypertension     Objective:    Today's Vitals   05/02/23 1254 05/02/23 1302  BP: 129/77   Pulse: 79   Weight: 181 lb 6.4 oz (82.3 kg)   Height: 5\' 8"  (1.727 m)   PainSc:  3    Body mass index is 27.58 kg/m.     05/02/2023    1:21 PM 04/30/2022    1:01 PM 08/01/2021    9:59 AM 04/24/2021   10:57 AM 12/28/2020    7:46 AM 01/14/2019    3:21 PM  Advanced Directives  Does Patient Have a Medical Advance Directive? No No No No No No  Would patient like information on creating a medical advance directive? No - Patient declined No - Patient declined No - Patient declined No - Patient declined No - Patient declined No - Patient declined    Current Medications (verified) Outpatient Encounter Medications as of 05/02/2023  Medication Sig   amitriptyline (ELAVIL) 25 MG tablet Take 1 tablet (25 mg total) by mouth at bedtime.   busPIRone (BUSPAR) 10 MG tablet TAKE ONE TABLET BY MOUTH AT BREAKFAST AND AT BEDTIME   Cholecalciferol (VITAMIN D3) 75 MCG (3000 UT) TABS Take 1 capsule by mouth daily.   cyclobenzaprine (FLEXERIL) 5 MG tablet Take 5 mg by mouth 3 (three) times daily as needed for muscle spasms.   DULoxetine (CYMBALTA) 60 MG capsule TAKE TWO CAPSULES BY MOUTH EVERY MORNING   esomeprazole (NEXIUM) 40 MG capsule Take 40 mg by mouth daily at 12 noon.   hydrOXYzine (ATARAX/VISTARIL) 25 MG tablet Take 1 tablet (25 mg total) by mouth at bedtime as needed.   levothyroxine (SYNTHROID) 125 MCG tablet TAKE ONE TABLET BY MOUTH ONCE WEEKLY BEFORE BREAKFAST ON MONDAY and TAKE ONE TABLET BY MOUTH ONCE WEEKLY BEFORE BREAKFAST ON  TUESDAYS and TAKE ONE TABLET BY MOUTH ONCE WEEKLY BEFORE BREAKFAST ON WEDNESDAYS and TAKE ONE TABLET BY MOUTH ONCE WEEKLY BEFORE  BREAKFAST ON THURSDAYS and TAKE ONE TABLET BY MOUTH ONCE WEEKLY BEFORE BREAKFAST ON FRIDAYS and TAKE ONE TABLET BY MOUTH ONCE WEEKLY BEFORE BREAKFAST ON SATURDAY   LINZESS 145 MCG CAPS capsule    liothyronine (CYTOMEL) 5 MCG tablet Take by mouth.   losartan (COZAAR) 100 MG tablet TAKE ONE TABLET BY MOUTH EVERY MORNING   Magnesium 400 MG CAPS Take 400 mg by mouth daily. Reported on 02/13/2016   meloxicam (MOBIC) 15 MG tablet TAKE ONE TABLET BY MOUTH EVERY MORNING   Methen-Hyosc-Meth Blue-Na Phos (UROGESIC-BLUE) 81.6 MG TABS Take 1 tablet (81.6 mg total) by mouth 4 (four) times daily as needed.   metoprolol succinate (TOPROL-XL) 100 MG 24 hr tablet TAKE ONE TABLET BY MOUTH EVERY MORNING   metoprolol succinate (TOPROL-XL) 50 MG 24 hr tablet TAKE ONE TABLET BY MOUTH EVERY MORNING   ondansetron (ZOFRAN) 4 MG tablet Take 4 mg by mouth every 8 (eight) hours as needed for nausea or vomiting.   Oxycodone HCl 10 MG TABS Take 1 tablet (10 mg total) by mouth 3 (three) times daily as needed.   UBRELVY 100 MG TABS Take by mouth.   zonisamide (ZONEGRAN) 100 MG capsule Take 200 mg by mouth 2 (two) times daily.   [DISCONTINUED] esomeprazole (NEXIUM) 40 MG capsule Take 40 mg by  mouth 2 (two) times daily before a meal.    [DISCONTINUED] rosuvastatin (CRESTOR) 5 MG tablet TAKE ONE TABLET BY MOUTH EVERYDAY AT BEDTIME   No facility-administered encounter medications on file as of 05/02/2023.    Allergies (verified) Ciprofloxacin, Sulfa antibiotics, Sulfasalazine, Cortisone, Tramadol, Zonisamide, Erythromycin, Erythromycin base, Levofloxacin, Lisinopril, and Prednisone   History: Past Medical History:  Diagnosis Date   Allergy    Anxiety    Aortic stenosis    Atrial fibrillation (HCC)    Back pain    Bilateral swelling of feet    Bursitis    Cervical cancer screening 09/07/2015   Menarche at 11 Irregular and heavy and painful flow secondary to fibroids No history of abnormal pap in past, last pap roughly 2003  G2P2, s/p 2 SVD history of abnormal MGM, 1 abnl bx right breast benign, normal otherwise Noconcerns today TAH b/l SPO for fibroids and migrainesand breast bx on right gyn surgeries   Constipation    Cough 08/13/2015   Depression    Depression with anxiety 06/29/2016   Fainting    once to due heart out of rythm   Fibromyalgia    Frequent headaches    GERD (gastroesophageal reflux disease)    Hair loss disorder 06/03/2017   Heartburn    History of blood clots    History of chicken pox    Hx of blood clots    dvt   Hyperlipidemia    Hyperplastic colon polyp    Hypertension    Hypothyroid    Insomnia 08/13/2015   Internal hemorrhoids    Interstitial cystitis    Joint pain    Lesion of lung    xray in 2014 thought to be benign seen at [facility] pulmonology   Measles    h/o   Migraine    Obesity    Osteoarthritis    Preventative health care 02/03/2017   Raynaud's disease    S/P AVR (aortic valve replacement) 03/19/2015   Shoulder pain 06/03/2017   UTI (lower urinary tract infection)    UTI (urinary tract infection) 02/11/2016   Past Surgical History:  Procedure Laterality Date   ABDOMINAL HYSTERECTOMY     TAH SPO   AORTIC VALVE REPLACEMENT  2014   with [facility]   ATRIAL ABLATION SURGERY     BLADDER SUSPENSION     BREAST BIOPSY Right    Needle Biopsy   COLONOSCOPY WITH PROPOFOL N/A 12/28/2020   Procedure: COLONOSCOPY WITH PROPOFOL;  Surgeon: Pasty Spillers, MD;  Location: [facility] ENDOSCOPY;  Service: Endoscopy;  Laterality: N/A;   CORONARY ARTERY BYPASS GRAFT     DILATION AND CURETTAGE OF UTERUS     x 3   ESOPHAGOGASTRODUODENOSCOPY (EGD) WITH PROPOFOL N/A 12/28/2020   Procedure: ESOPHAGOGASTRODUODENOSCOPY (EGD) WITH PROPOFOL;  Surgeon: Pasty Spillers, MD;  Location: [facility] ENDOSCOPY;  Service: Endoscopy;  Laterality: N/A;   loop heart     SHOULDER SURGERY Left    arthroscopy for spurs   TONSILLECTOMY     TUBAL LIGATION     Family History  Problem Relation Age of Onset    Heart disease Mother        congestive heart failure   Arthritis Mother        s/p TKR   Neuropathy Mother    Hyperlipidemia Mother    Other Mother        familial mediterranean fever   Thyroid disease Mother    Hypertension Mother    Obesity Mother  Colon polyps Father    Alzheimer's disease Father    Alcohol abuse Father    Cancer Father        colon cancer   Cancer Brother        prostate cancer   Heart disease Brother        cardiomegaly   Other Brother        familial mediterranean fever   Interstitial cystitis Daughter    Arthritis Son    Alcohol abuse Son        in remission   Mental illness Son        depression   Other Son        interstitial cystitis   Arthritis Paternal Uncle    Asthma Maternal Grandmother    Congestive Heart Failure Maternal Grandmother    Heart disease Maternal Grandmother        chf   Stroke Maternal Grandfather    Diabetes Maternal Grandfather    Heart disease Maternal Grandfather        hardening of the arteries   Stroke Paternal Grandfather    Atrial fibrillation Paternal Grandfather    Alcohol abuse Paternal Grandfather    Heart disease Paternal Grandfather        afib   Colon cancer Other        parent   Social History   Socioeconomic History   Marital status: Married    Spouse name: Not on file   Number of children: 2   Years of education: Not on file   Highest education level: Bachelor's degree (e.g., BA, AB, BS)  Occupational History   Occupation: Public house manager: [employer]  Tobacco Use   Smoking status: Never   Smokeless tobacco: Never  Substance and Sexual Activity   Alcohol use: No   Drug use: No   Sexual activity: Yes    Comment: lives with husband and adult son, no major dietary restrictions, full diability  Other Topics Concern   Not on file  Social History Narrative   Not on file   Social Determinants of Health   Financial Resource Strain: Low Risk  (05/02/2023)   Overall  Financial Resource Strain (CARDIA)    Difficulty of Paying Living Expenses: Not hard at all  Food Insecurity: Low Risk  (04/22/2023)   Received from [facility]   Food vital sign    Within the past 12 months, you worried that your food would run out before you got money to buy more: Never true    Within the past 12 months, the food you bought just didn't last and you didn't have money to get more. : Never true  Transportation Needs: No Transportation Needs (05/02/2023)   PRAPARE - Administrator, Civil Service (Medical): No    Lack of Transportation (Non-Medical): No  Physical Activity: Inactive (05/02/2023)   Exercise Vital Sign    Days of Exercise per Week: 0 days    Minutes of Exercise per Session: 0 min  Stress: No Stress Concern Present (05/02/2023)   [employer] of Occupational Health - Occupational Stress Questionnaire    Feeling of Stress : Only a little  Social Connections: Moderately Integrated (05/02/2023)   Social Connection and Isolation Panel [NHANES]    Frequency of Communication with Friends and Family: More than three times a week    Frequency of Social Gatherings with Friends and Family: Once a week    Attends Religious Services: More than  4 times per year    Active Member of Clubs or Organizations: No    Attends Banker Meetings: Never    Marital Status: Married    Tobacco Counseling Counseling given: Not Answered   Clinical Intake:  Pre-visit preparation completed: Yes  Pain : 0-10 Pain Score: 3  Pain Type: Chronic pain Pain Location: Perineum  BMI - recorded: 27.58 Nutritional Risks: None Diabetes: No  How often do you need to have someone help you when you read instructions, pamphlets, or other written materials from your doctor or pharmacy?: 1 - Never  Interpreter Needed?: No  Information entered by :: Donne Anon, CMA   Activities of Daily Living    05/02/2023    1:03 PM  In your present state of health, do you have  any difficulty performing the following activities:  Hearing? 1  Comment slight hearing loss  Vision? 1  Comment has cataracts  Difficulty concentrating or making decisions? 0  Walking or climbing stairs? 1  Dressing or bathing? 0  Doing errands, shopping? 0  Preparing Food and eating ? N  Using the Toilet? N  In the past six months, have you accidently leaked urine? N  Do you have problems with loss of bowel control? N  Managing your Medications? N  Managing your Finances? N  Housekeeping or managing your Housekeeping? N    Patient Care Team: Bradd Canary, MD as PCP - General (Family Medicine) Garen Grams, MD as Referring Physician (Cardiology) Newt Lukes, MD as Referring Physician (Internal Medicine) Ty Hilts Jerre Simon, MD as Referring Physician (Cardiothoracic Surgery) Iven Finn., MD as Referring Physician (Neurology)  Indicate any recent Medical Services you may have received from other than [facility] providers in the past year (date may be approximate).     Assessment:   This is a routine wellness examination for Vivianne.  Hearing/Vision screen No results found.  Dietary issues and exercise activities discussed:     Goals Addressed   None    Depression Screen    05/02/2023    1:25 PM 12/03/2022    1:47 PM 05/28/2022    1:44 PM 04/30/2022    1:03 PM 01/22/2022    3:57 PM 08/01/2021    9:59 AM 07/18/2021    3:02 PM  PHQ 2/9 Scores  PHQ - 2 Score 0 0 0 3 0 0 0  PHQ- 9 Score  0 1 7 5       Fall Risk    05/02/2023    1:13 PM 12/03/2022    1:47 PM 05/28/2022    1:43 PM 04/30/2022    1:02 PM 01/22/2022    3:57 PM  Fall Risk   Falls in the past year? 1 0 0 0 0  Comment tripped over a box      Number falls in past yr: 0 0 0 0 0  Injury with Fall? 1 0 0 0 0  Risk for fall due to : History of fall(s)   No Fall Risks No Fall Risks  Follow up Falls evaluation completed Falls evaluation completed  Falls evaluation completed Falls evaluation completed     MEDICARE RISK AT HOME:  Medicare Risk at Home - 05/02/23 1307     Any stairs in or around the home? Yes    If so, are there any without handrails? No    Home free of loose throw rugs in walkways, pet beds, electrical cords, etc? Yes    Adequate lighting in your  home to reduce risk of falls? Yes    Life alert? No    Use of a cane, walker or w/c? No    Grab bars in the bathroom? No    Shower chair or bench in shower? No    Elevated toilet seat or a handicapped toilet? Yes             TIMED UP AND GO:  Was the test performed?  Yes  Length of time to ambulate 10 feet: 6 sec Gait steady and fast without use of assistive device    Cognitive Function:        05/02/2023    1:25 PM 04/30/2022    1:13 PM  6CIT Screen  What Year? 0 points 0 points  What month? 0 points 0 points  What time? 0 points 0 points  Count back from 20 0 points 0 points  Months in reverse 0 points 0 points  Repeat phrase 0 points 0 points  Total Score 0 points 0 points    Immunizations Immunization History  Administered Date(s) Administered   Fluad Quad(high Dose 65+) 07/27/2020, 06/07/2021, 07/10/2022   Influenza,inj,Quad PF,6+ Mos 07/29/2019   PFIZER(Purple Top)SARS-COV-2 Vaccination 12/23/2019, 01/17/2020, 11/27/2020, 06/26/2021   PNEUMOCOCCAL CONJUGATE-20 05/28/2022   Pneumococcal Polysaccharide-23 07/27/2020   Tdap 05/26/2015   Zoster Recombinant(Shingrix) 05/29/2018, 08/01/2018   Zoster, Live 05/26/2015    TDAP status: Up to date  Flu Vaccine status: Due, Education has been provided regarding the importance of this vaccine. Advised may receive this vaccine at local pharmacy or Health Dept. Aware to provide a copy of the vaccination record if obtained from local pharmacy or Health Dept. Verbalized acceptance and understanding.  Pneumococcal vaccine status: Up to date  Covid-19 vaccine status: Information provided on how to obtain vaccines.   Qualifies for Shingles Vaccine? Yes    Zostavax completed Yes   Shingrix Completed?: Yes  Screening Tests Health Maintenance  Topic Date Due   COVID-19 Vaccine (5 - 2023-24 season) 05/31/2022   Medicare Annual Wellness (AWV)  05/01/2023   INFLUENZA VACCINE  05/01/2023   Colonoscopy  12/29/2023   MAMMOGRAM  10/23/2024   DTaP/Tdap/Td (2 - Td or Tdap) 05/25/2025   Pneumonia Vaccine 74+ Years old  Completed   DEXA SCAN  Completed   Zoster Vaccines- Shingrix  Completed   HPV VACCINES  Aged Out   Hepatitis C Screening  Discontinued    Health Maintenance  Health Maintenance Due  Topic Date Due   COVID-19 Vaccine (5 - 2023-24 season) 05/31/2022   Medicare Annual Wellness (AWV)  05/01/2023   INFLUENZA VACCINE  05/01/2023    Colorectal cancer screening: Type of screening: Colonoscopy. Completed 12/28/20. Repeat every 3 years  Mammogram status: Completed 10/23/22. Repeat every year  Bone Density status: Completed 12/10/22. Results reflect: Bone density results: NORMAL. Repeat every 2 years.  Lung Cancer Screening: (Low Dose CT Chest recommended if Age 1-80 years, 20 pack-year currently smoking OR have quit w/in 15years.) does not qualify.   Additional Screening:  Hepatitis C Screening: does qualify; Completed N/a  Vision Screening: Recommended annual ophthalmology exams for early detection of glaucoma and other disorders of the eye. Is the patient up to date with their annual eye exam?  Yes  Who is the provider or what is the name of the office in which the patient attends annual eye exams? [facility] If pt is not established with a provider, would they like to be referred to a provider to establish care?  No .   Dental Screening: Recommended annual dental exams for proper oral hygiene  Diabetic Foot Exam: N/a  Community Resource Referral / Chronic Care Management: CRR required this visit?  No   CCM required this visit?  No     Plan:     I have personally reviewed and noted the following in the patient's  chart:   Medical and social history Use of alcohol, tobacco or illicit drugs  Current medications and supplements including opioid prescriptions. Patient is not currently taking opioid prescriptions. Functional ability and status Nutritional status Physical activity Advanced directives List of other physicians Hospitalizations, surgeries, and ER visits in previous 12 months Vitals Screenings to include cognitive, depression, and falls Referrals and appointments  In addition, I have reviewed and discussed with patient certain preventive protocols, quality metrics, and best practice recommendations. A written personalized care plan for preventive services as well as general preventive health recommendations were provided to patient.     Donne Anon, CMA   05/02/2023   After Visit Summary: sent to mychart  Nurse Notes: None

## 2023-05-07 DIAGNOSIS — Z952 Presence of prosthetic heart valve: Secondary | ICD-10-CM | POA: Diagnosis not present

## 2023-05-07 DIAGNOSIS — I480 Paroxysmal atrial fibrillation: Secondary | ICD-10-CM | POA: Diagnosis not present

## 2023-05-07 DIAGNOSIS — N951 Menopausal and female climacteric states: Secondary | ICD-10-CM | POA: Diagnosis not present

## 2023-05-07 DIAGNOSIS — I350 Nonrheumatic aortic (valve) stenosis: Secondary | ICD-10-CM | POA: Diagnosis not present

## 2023-05-07 DIAGNOSIS — Z4509 Encounter for adjustment and management of other cardiac device: Secondary | ICD-10-CM | POA: Diagnosis not present

## 2023-05-07 DIAGNOSIS — R634 Abnormal weight loss: Secondary | ICD-10-CM | POA: Diagnosis not present

## 2023-05-07 DIAGNOSIS — E038 Other specified hypothyroidism: Secondary | ICD-10-CM | POA: Diagnosis not present

## 2023-05-12 DIAGNOSIS — Z79890 Hormone replacement therapy (postmenopausal): Secondary | ICD-10-CM | POA: Diagnosis not present

## 2023-05-12 DIAGNOSIS — G4700 Insomnia, unspecified: Secondary | ICD-10-CM | POA: Diagnosis not present

## 2023-05-12 DIAGNOSIS — M2550 Pain in unspecified joint: Secondary | ICD-10-CM | POA: Diagnosis not present

## 2023-05-12 DIAGNOSIS — N951 Menopausal and female climacteric states: Secondary | ICD-10-CM | POA: Diagnosis not present

## 2023-05-12 DIAGNOSIS — Z6827 Body mass index (BMI) 27.0-27.9, adult: Secondary | ICD-10-CM | POA: Diagnosis not present

## 2023-05-13 DIAGNOSIS — Z952 Presence of prosthetic heart valve: Secondary | ICD-10-CM | POA: Diagnosis not present

## 2023-05-13 DIAGNOSIS — I480 Paroxysmal atrial fibrillation: Secondary | ICD-10-CM | POA: Diagnosis not present

## 2023-05-13 DIAGNOSIS — I4719 Other supraventricular tachycardia: Secondary | ICD-10-CM | POA: Diagnosis not present

## 2023-05-15 ENCOUNTER — Encounter (INDEPENDENT_AMBULATORY_CARE_PROVIDER_SITE_OTHER): Payer: Self-pay

## 2023-05-23 ENCOUNTER — Other Ambulatory Visit: Payer: Self-pay | Admitting: Family Medicine

## 2023-06-04 NOTE — Assessment & Plan Note (Signed)
Tolerating current meds, rate controlled

## 2023-06-04 NOTE — Assessment & Plan Note (Signed)
>>  ASSESSMENT AND PLAN FOR CLASS 2 SEVERE OBESITY WITH SERIOUS COMORBIDITY AND BODY MASS INDEX (BMI) OF 36.0 TO 36.9 IN ADULT, UNSPECIFIED OBESITY TYPE (HCC) WRITTEN ON 06/04/2023  9:42 PM BY BLYTH, STACEY A, MD  Good weight loss with portion control and testosterone  treatments.

## 2023-06-04 NOTE — Assessment & Plan Note (Signed)
Supplement and monitor 

## 2023-06-04 NOTE — Assessment & Plan Note (Signed)
hgba1c acceptable, minimize simple carbs. Increase exercise as tolerated.  

## 2023-06-04 NOTE — Assessment & Plan Note (Signed)
Good weight loss with portion control and testosterone treatments.

## 2023-06-04 NOTE — Assessment & Plan Note (Signed)
Encourage heart healthy diet such as MIND or DASH diet, increase exercise, avoid trans fats, simple carbohydrates and processed foods, consider a krill or fish or flaxseed oil cap daily.  °

## 2023-06-04 NOTE — Assessment & Plan Note (Signed)
Well controlled, no changes to meds. Encouraged heart healthy diet such as the DASH diet and exercise as tolerated.  °

## 2023-06-05 ENCOUNTER — Encounter: Payer: Self-pay | Admitting: Family Medicine

## 2023-06-05 ENCOUNTER — Ambulatory Visit (INDEPENDENT_AMBULATORY_CARE_PROVIDER_SITE_OTHER): Payer: PPO | Admitting: Family Medicine

## 2023-06-05 VITALS — BP 126/70 | HR 83 | Temp 98.0°F | Resp 16 | Ht 68.0 in | Wt 172.6 lb

## 2023-06-05 DIAGNOSIS — R739 Hyperglycemia, unspecified: Secondary | ICD-10-CM

## 2023-06-05 DIAGNOSIS — E559 Vitamin D deficiency, unspecified: Secondary | ICD-10-CM | POA: Diagnosis not present

## 2023-06-05 DIAGNOSIS — E785 Hyperlipidemia, unspecified: Secondary | ICD-10-CM | POA: Diagnosis not present

## 2023-06-05 DIAGNOSIS — I10 Essential (primary) hypertension: Secondary | ICD-10-CM | POA: Diagnosis not present

## 2023-06-05 DIAGNOSIS — I4891 Unspecified atrial fibrillation: Secondary | ICD-10-CM

## 2023-06-05 DIAGNOSIS — Z6836 Body mass index (BMI) 36.0-36.9, adult: Secondary | ICD-10-CM | POA: Diagnosis not present

## 2023-06-05 MED ORDER — OXYCODONE HCL 10 MG PO TABS: 10.0000 mg | ORAL_TABLET | Freq: Three times a day (TID) | ORAL | 0 refills | Status: DC | PRN

## 2023-06-05 NOTE — Patient Instructions (Signed)
Acute Back Pain, Adult Acute back pain is sudden and usually short-lived. It is often caused by an injury to the muscles and tissues in the back. The injury may result from: A muscle, tendon, or ligament getting overstretched or torn. Ligaments are tissues that connect bones to each other. Lifting something improperly can cause a back strain. Wear and tear (degeneration) of the spinal disks. Spinal disks are circular tissue that provide cushioning between the bones of the spine (vertebrae). Twisting motions, such as while playing sports or doing yard work. A hit to the back. Arthritis. You may have a physical exam, lab tests, and imaging tests to find the cause of your pain. Acute back pain usually goes away with rest and home care. Follow these instructions at home: Managing pain, stiffness, and swelling Take over-the-counter and prescription medicines only as told by your health care provider. Treatment may include medicines for pain and inflammation that are taken by mouth or applied to the skin, or muscle relaxants. Your health care provider may recommend applying ice during the first 24-48 hours after your pain starts. To do this: Put ice in a plastic bag. Place a towel between your skin and the bag. Leave the ice on for 20 minutes, 2-3 times a day. Remove the ice if your skin turns bright red. This is very important. If you cannot feel pain, heat, or cold, you have a greater risk of damage to the area. If directed, apply heat to the affected area as often as told by your health care provider. Use the heat source that your health care provider recommends, such as a moist heat pack or a heating pad. Place a towel between your skin and the heat source. Leave the heat on for 20-30 minutes. Remove the heat if your skin turns bright red. This is especially important if you are unable to feel pain, heat, or cold. You have a greater risk of getting burned. Activity  Do not stay in bed. Staying in  bed for more than 1-2 days can delay your recovery. Sit up and stand up straight. Avoid leaning forward when you sit or hunching over when you stand. If you work at a desk, sit close to it so you do not need to lean over. Keep your chin tucked in. Keep your neck drawn back, and keep your elbows bent at a 90-degree angle (right angle). Sit high and close to the steering wheel when you drive. Add lower back (lumbar) support to your car seat, if needed. Take short walks on even surfaces as soon as you are able. Try to increase the length of time you walk each day. Do not sit, drive, or stand in one place for more than 30 minutes at a time. Sitting or standing for long periods of time can put stress on your back. Do not drive or use heavy machinery while taking prescription pain medicine. Use proper lifting techniques. When you bend and lift, use positions that put less stress on your back: Bend your knees. Keep the load close to your body. Avoid twisting. Exercise regularly as told by your health care provider. Exercising helps your back heal faster and helps prevent back injuries by keeping muscles strong and flexible. Work with a physical therapist to make a safe exercise program, as recommended by your health care provider. Do any exercises as told by your physical therapist. Lifestyle Maintain a healthy weight. Extra weight puts stress on your back and makes it difficult to have good   posture. Avoid activities or situations that make you feel anxious or stressed. Stress and anxiety increase muscle tension and can make back pain worse. Learn ways to manage anxiety and stress, such as through exercise. General instructions Sleep on a firm mattress in a comfortable position. Try lying on your side with your knees slightly bent. If you lie on your back, put a pillow under your knees. Keep your head and neck in a straight line with your spine (neutral position) when using electronic equipment like  smartphones or pads. To do this: Raise your smartphone or pad to look at it instead of bending your head or neck to look down. Put the smartphone or pad at the level of your face while looking at the screen. Follow your treatment plan as told by your health care provider. This may include: Cognitive or behavioral therapy. Acupuncture or massage therapy. Meditation or yoga. Contact a health care provider if: You have pain that is not relieved with rest or medicine. You have increasing pain going down into your legs or buttocks. Your pain does not improve after 2 weeks. You have pain at night. You lose weight without trying. You have a fever or chills. You develop nausea or vomiting. You develop abdominal pain. Get help right away if: You develop new bowel or bladder control problems. You have unusual weakness or numbness in your arms or legs. You feel faint. These symptoms may represent a serious problem that is an emergency. Do not wait to see if the symptoms will go away. Get medical help right away. Call your local emergency services (911 in the [place] Do not drive yourself to the hospital. Summary Acute back pain is sudden and usually short-lived. Use proper lifting techniques. When you bend and lift, use positions that put less stress on your back. Take over-the-counter and prescription medicines only as told by your health care provider, and apply heat or ice as told. This information is not intended to replace advice given to you by your health care provider. Make sure you discuss any questions you have with your health care provider. Document Revised: 12/08/2020 Document Reviewed: 12/08/2020 Elsevier Patient Education  2024 [employer]  

## 2023-06-06 ENCOUNTER — Encounter: Payer: Self-pay | Admitting: Family Medicine

## 2023-06-06 ENCOUNTER — Telehealth: Payer: Self-pay

## 2023-06-06 LAB — CBC WITH DIFFERENTIAL/PLATELET
Basophils Absolute: 0.1 10*3/uL (ref 0.0–0.1)
Basophils Relative: 0.9 % (ref 0.0–3.0)
Eosinophils Absolute: 0.3 10*3/uL (ref 0.0–0.7)
Eosinophils Relative: 3.5 % (ref 0.0–5.0)
HCT: 44.7 % (ref 36.0–46.0)
Hemoglobin: 14.5 g/dL (ref 12.0–15.0)
Lymphocytes Relative: 20.7 % (ref 12.0–46.0)
Lymphs Abs: 1.6 10*3/uL (ref 0.7–4.0)
MCHC: 32.3 g/dL (ref 30.0–36.0)
MCV: 93.5 fl (ref 78.0–100.0)
Monocytes Absolute: 0.4 10*3/uL (ref 0.1–1.0)
Monocytes Relative: 4.8 % (ref 3.0–12.0)
Neutro Abs: 5.3 10*3/uL (ref 1.4–7.7)
Neutrophils Relative %: 70.1 % (ref 43.0–77.0)
Platelets: 246 10*3/uL (ref 150.0–400.0)
RBC: 4.79 Mil/uL (ref 3.87–5.11)
RDW: 14.6 % (ref 11.5–15.5)
WBC: 7.5 10*3/uL (ref 4.0–10.5)

## 2023-06-06 LAB — LIPID PANEL
Cholesterol: 237 mg/dL — ABNORMAL HIGH (ref 0–200)
HDL: 60.8 mg/dL (ref >39.00)
LDL Cholesterol: 154 mg/dL — ABNORMAL HIGH (ref 0–99)
NonHDL: 175.81
Total CHOL/HDL Ratio: 4
Triglycerides: 111 mg/dL (ref 0.0–149.0)
VLDL: 22.2 mg/dL (ref 0.0–40.0)

## 2023-06-06 LAB — VITAMIN D 25 HYDROXY (VIT D DEFICIENCY, FRACTURES): VITD: 103.61 ng/mL (ref 30.00–100.00)

## 2023-06-06 LAB — COMPREHENSIVE METABOLIC PANEL
ALT: 10 U/L (ref 0–35)
AST: 12 U/L (ref 0–37)
Albumin: 4 g/dL (ref 3.5–5.2)
Alkaline Phosphatase: 77 U/L (ref 39–117)
BUN: 12 mg/dL (ref 6–23)
CO2: 27 meq/L (ref 19–32)
Calcium: 9.5 mg/dL (ref 8.4–10.5)
Chloride: 102 meq/L (ref 96–112)
Creatinine, Ser: 0.99 mg/dL (ref 0.40–1.20)
GFR: 58.75 mL/min — ABNORMAL LOW (ref >60.00)
Glucose, Bld: 94 mg/dL (ref 70–99)
Potassium: 4.2 meq/L (ref 3.5–5.1)
Sodium: 137 meq/L (ref 135–145)
Total Bilirubin: 0.7 mg/dL (ref 0.2–1.2)
Total Protein: 6.2 g/dL (ref 6.0–8.3)

## 2023-06-06 LAB — HEMOGLOBIN A1C: Hgb A1c MFr Bld: 5.3 % (ref 4.6–6.5)

## 2023-06-06 NOTE — Telephone Encounter (Signed)
Vitamin D elevated, okay to wait for PCP

## 2023-06-06 NOTE — Telephone Encounter (Signed)
CRITICAL VALUE STICKER  CRITICAL VALUE: Vitamin D: 103.61  RECEIVER (on-site recipient of call): Melton Alar, RMA  DATE & TIME NOTIFIED:  11:47 AM, 06/06/23  MESSENGER (representative from lab): Hope  MD NOTIFIED: Abner Greenspan- PCP and Drue Novel- DOD  TIME OF NOTIFICATION: 11:47 AM, 06/06/23  RESPONSE:  Pending

## 2023-06-09 ENCOUNTER — Other Ambulatory Visit: Payer: Self-pay

## 2023-06-09 MED ORDER — ROSUVASTATIN CALCIUM 5 MG PO TABS: 5.0000 mg | ORAL_TABLET | Freq: Every day | ORAL | 3 refills | Status: DC

## 2023-06-09 NOTE — Progress Notes (Signed)
Subjective:    Patient ID: Alexandra Patterson, female    DOB: Jan 03, 1956, 67 y.o.   MRN: [id number]  Chief Complaint  Patient presents with  . Follow-up    Follow up    HPI Discussed the use of AI scribe software for clinical note transcription with the patient, who gave verbal consent to proceed.  History of Present Illness   The patient, with a history of thyroid issues, back pain, and knee pain, presents with chronic constipation. The constipation has been severe enough to warrant the use of laxatives, with the patient reporting a week between bowel movements prior to taking a laxative. The patient expresses frustration with the constipation, which persists despite medication (Linzess). The patient also reports that their thyroid medication dosage has not been increased despite being in the lower normal range, which they believe may be contributing to their constipation.  The patient also reports knee pain severe enough to prevent them from walking [place] the green[place]. Despite this, the patient has been successful in losing weight through a restricted diet and maintaining fluid and protein intake. The patient also reports a history of palpitations, but recent monitoring has not shown any changes that need to be addressed.  The patient is also on pain medication (oxycodone) for back pain, which they report is effective. The patient reports that their back pain can be both in the lower and upper back, with a history of a disc problem in the lower back. The patient's back pain has been exacerbated recently due to preparations for a yard sale.        Past Medical History:  Diagnosis Date  . Allergy   . Anxiety   . Aortic stenosis   . Atrial fibrillation (HCC)   . Back pain   . Bilateral swelling of feet   . Bursitis   . Cervical cancer screening 09/07/2015   Menarche at 11 Irregular and heavy and painful flow secondary to fibroids No history of abnormal pap in past, last pap roughly 2003 G2P2,  s/p 2 SVD history of abnormal MGM, 1 abnl bx right breast benign, normal otherwise Noconcerns today TAH b/l SPO for fibroids and migrainesand breast bx on right gyn surgeries  . Constipation   . Cough 08/13/2015  . Depression   . Depression with anxiety 06/29/2016  . Fainting    once to due heart out of rythm  . Fibromyalgia   . Frequent headaches   . GERD (gastroesophageal reflux disease)   . 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 Heartburn   . History of blood clots   . History of chicken pox   . Hx of blood clots    dvt  . Hyperlipidemia   . Hyperplastic colon polyp   . Hypertension   . Hypothyroid   . Insomnia 08/13/2015  . Internal hemorrhoids   . Interstitial cystitis   . Joint pain   . Lesion of lung    xray in 2014 thought to be benign seen at [facility] pulmonology  . Measles    h/o  . Migraine   . Obesity   . Osteoarthritis   . Preventative health care 02/03/2017  . Raynaud's disease   . S/P AVR (aortic valve replacement) 03/19/2015  . Shoulder pain 06/03/2017  . UTI (lower urinary tract infection)   . UTI (urinary tract infection) 02/11/2016    Past Surgical History:  Procedure Laterality Date  . ABDOMINAL HYSTERECTOMY     TAH SPO  . AORTIC VALVE REPLACEMENT  2014   with [facility]  . ATRIAL ABLATION SURGERY    . BLADDER SUSPENSION    . BREAST BIOPSY Right    Needle Biopsy  . COLONOSCOPY WITH PROPOFOL N/A 12/28/2020   Procedure: COLONOSCOPY WITH PROPOFOL;  Surgeon: Pasty Spillers, MD;  Location: [facility] ENDOSCOPY;  Service: Endoscopy;  Laterality: N/A;  . CORONARY ARTERY BYPASS GRAFT    . DILATION AND CURETTAGE OF UTERUS     x 3  . ESOPHAGOGASTRODUODENOSCOPY (EGD) WITH PROPOFOL N/A 12/28/2020   Procedure: ESOPHAGOGASTRODUODENOSCOPY (EGD) WITH PROPOFOL;  Surgeon: Pasty Spillers, MD;  Location: [facility] ENDOSCOPY;  Service: Endoscopy;  Laterality: N/A;  . loop heart    . SHOULDER SURGERY Left    arthroscopy for spurs  . TONSILLECTOMY    . TUBAL LIGATION       Family History  Problem Relation Age of Onset  . Heart disease Mother        congestive heart failure  . Arthritis Mother        s/p TKR  . Neuropathy Mother   . Hyperlipidemia Mother   . Other Mother        familial mediterranean fever  . Thyroid disease Mother   . Hypertension Mother   . Obesity Mother   . Colon polyps Father   . Alzheimer's disease Father   . Alcohol abuse Father   . Cancer Father        colon cancer  . Cancer Brother        prostate cancer  . Heart disease Brother        cardiomegaly  . Other Brother        familial mediterranean fever  . Interstitial cystitis Daughter   . Arthritis Son   . Alcohol abuse Son        in remission  . Mental illness Son        depression  . Other Son        interstitial cystitis  . Arthritis Paternal Uncle   . Asthma Maternal Grandmother   . Congestive Heart Failure Maternal Grandmother   . Heart disease Maternal Grandmother        chf  . Stroke Maternal Grandfather   . Diabetes Maternal Grandfather   . Heart disease Maternal Grandfather        hardening of the arteries  . Stroke Paternal Grandfather   . Atrial fibrillation Paternal Grandfather   . Alcohol abuse Paternal Grandfather   . Heart disease Paternal Grandfather        afib  . Colon cancer Other        parent    Social History   Socioeconomic History  . Marital status: Married    Spouse name: Not on file  . Number of children: 2  . Years of education: Not on file  . Highest education level: Bachelor's degree (e.g., BA, AB, BS)  Occupational History  . Occupation: Public house manager: [employer]  Tobacco Use  . Smoking status: Never  . Smokeless tobacco: Never  Substance and Sexual Activity  . Alcohol use: No  . Drug use: No  . Sexual activity: Yes    Comment: lives with husband and adult son, no major dietary restrictions, full diability  Other Topics Concern  . Not on file  Social History Narrative  . Not on file    Social Determinants of Health   Financial Resource Strain: Low Risk  (05/02/2023)   Overall Physicist, medical Strain (  CARDIA)   . Difficulty of Paying Living Expenses: Not hard at all  Food Insecurity: Low Risk  (04/22/2023)   Received from [facility]   Hunger Vital Sign   . Worried About Programme researcher, broadcasting/film/video in the Last Year: Never true   . Ran Out of Food in the Last Year: Never true  Transportation Needs: No Transportation Needs (05/02/2023)   PRAPARE - Transportation   . Lack of Transportation (Medical): No   . Lack of Transportation (Non-Medical): No  Physical Activity: Inactive (05/02/2023)   Exercise Vital Sign   . Days of Exercise per Week: 0 days   . Minutes of Exercise per Session: 0 min  Stress: No Stress Concern Present (05/02/2023)   [employer] of Occupational Health - Occupational Stress Questionnaire   . Feeling of Stress : Only a little  Social Connections: Moderately Integrated (05/02/2023)   Social Connection and Isolation Panel [NHANES]   . Frequency of Communication with Friends and Family: More than three times a week   . Frequency of Social Gatherings with Friends and Family: Once a week   . Attends Religious Services: More than 4 times per year   . Active Member of Clubs or Organizations: No   . Attends Banker Meetings: Never   . Marital Status: Married  Catering manager Violence: Not At Risk (05/02/2023)   Humiliation, Afraid, Rape, and Kick questionnaire   . Fear of Current or Ex-Partner: No   . Emotionally Abused: No   . Physically Abused: No   . Sexually Abused: No    Outpatient Medications Prior to Visit  Medication Sig Dispense Refill  . amitriptyline (ELAVIL) 25 MG tablet Take 1 tablet (25 mg total) by mouth at bedtime.    . busPIRone (BUSPAR) 10 MG tablet Take 1 tablet (10 mg total) by mouth 2 (two) times daily. 180 tablet 0  . Cholecalciferol (VITAMIN D3) 75 MCG (3000 UT) TABS Take 1 capsule by mouth daily.    . cyclobenzaprine  (FLEXERIL) 5 MG tablet Take 5 mg by mouth 3 (three) times daily as needed for muscle spasms.    . DULoxetine (CYMBALTA) 60 MG capsule TAKE TWO CAPSULES BY MOUTH EVERY MORNING 180 capsule 1  . esomeprazole (NEXIUM) 40 MG capsule Take 40 mg by mouth daily at 12 noon.    . hydrOXYzine (ATARAX/VISTARIL) 25 MG tablet Take 1 tablet (25 mg total) by mouth at bedtime as needed. 90 tablet 1  . levothyroxine (SYNTHROID) 125 MCG tablet TAKE ONE TABLET BY MOUTH ONCE WEEKLY BEFORE BREAKFAST ON MONDAY and TAKE ONE TABLET BY MOUTH ONCE WEEKLY BEFORE BREAKFAST ON  TUESDAYS and TAKE ONE TABLET BY MOUTH ONCE WEEKLY BEFORE BREAKFAST ON WEDNESDAYS and TAKE ONE TABLET BY MOUTH ONCE WEEKLY BEFORE BREAKFAST ON THURSDAYS and TAKE ONE TABLET BY MOUTH ONCE WEEKLY BEFORE BREAKFAST ON FRIDAYS and TAKE ONE TABLET BY MOUTH ONCE WEEKLY BEFORE BREAKFAST ON SATURDAY 78 tablet 1  . LINZESS 145 MCG CAPS capsule     . liothyronine (CYTOMEL) 5 MCG tablet Take by mouth.    . losartan (COZAAR) 100 MG tablet TAKE ONE TABLET BY MOUTH EVERY MORNING 90 tablet 1  . Magnesium 400 MG CAPS Take 400 mg by mouth daily. Reported on 02/13/2016    . meloxicam (MOBIC) 15 MG tablet TAKE ONE TABLET BY MOUTH EVERY MORNING 90 tablet 1  . Methen-Hyosc-Meth Blue-Na Phos (UROGESIC-BLUE) 81.6 MG TABS Take 1 tablet (81.6 mg total) by mouth 4 (four) times daily as needed.    Marland Kitchen  metoprolol succinate (TOPROL-XL) 100 MG 24 hr tablet TAKE ONE TABLET BY MOUTH EVERY MORNING 90 tablet 1  . metoprolol succinate (TOPROL-XL) 50 MG 24 hr tablet TAKE ONE TABLET BY MOUTH EVERY MORNING 90 tablet 1  . ondansetron (ZOFRAN) 4 MG tablet Take 4 mg by mouth every 8 (eight) hours as needed for nausea or vomiting.    Marland Kitchen UBRELVY 100 MG TABS Take by mouth.    . zonisamide (ZONEGRAN) 100 MG capsule Take 200 mg by mouth 2 (two) times daily.    . Oxycodone HCl 10 MG TABS Take 1 tablet (10 mg total) by mouth 3 (three) times daily as needed. 90 tablet 0   No facility-administered medications  prior to visit.    Allergies  Allergen Reactions  . Ciprofloxacin Rash and Shortness Of Breath  . Sulfa Antibiotics Hives  . Sulfasalazine Hives  . Cortisone Other (See Comments)    Red flush and anxiety  . Tramadol Other (See Comments)    headache  . Zonisamide Other (See Comments)    unk  . Erythromycin Rash  . Erythromycin Base Rash  . Levofloxacin Nausea Only  . Lisinopril     Other reaction(s): Cough (ALLERGY/intolerance)  . Prednisone Rash and Anxiety    Review of Systems  Constitutional:  Positive for malaise/fatigue and weight loss. Negative for fever.  HENT:  Negative for congestion.   Eyes:  Negative for blurred vision.  Respiratory:  Negative for shortness of breath.   Cardiovascular:  Negative for chest pain, palpitations and leg swelling.  Gastrointestinal:  Positive for constipation. Negative for abdominal pain, blood in stool and nausea.  Genitourinary:  Negative for dysuria and frequency.  Musculoskeletal:  Positive for back pain. Negative for falls.  Skin:  Negative for rash.  Neurological:  Negative for dizziness, loss of consciousness and headaches.  Endo/Heme/Allergies:  Negative for environmental allergies.  Psychiatric/Behavioral:  Negative for depression. The patient is not nervous/anxious.        Objective:    Physical Exam Constitutional:      General: She is not in acute distress.    Appearance: Normal appearance. She is well-developed. She is not toxic-appearing.  HENT:     Head: Normocephalic and atraumatic.     Right Ear: External ear normal.     Left Ear: External ear normal.     Nose: Nose normal.  Eyes:     General:        Right eye: No discharge.        Left eye: No discharge.     Conjunctiva/sclera: Conjunctivae normal.  Neck:     Thyroid: No thyromegaly.  Cardiovascular:     Rate and Rhythm: Normal rate and regular rhythm.     Heart sounds: Normal heart sounds. No murmur heard. Pulmonary:     Effort: Pulmonary effort is  normal. No respiratory distress.     Breath sounds: Normal breath sounds.  Abdominal:     General: Bowel sounds are normal.     Palpations: Abdomen is soft.     Tenderness: There is no abdominal tenderness. There is no guarding.  Musculoskeletal:        General: Normal range of motion.     Cervical back: Neck supple.  Lymphadenopathy:     Cervical: No cervical adenopathy.  Skin:    General: Skin is warm and dry.  Neurological:     Mental Status: She is alert and oriented to person, place, and time.  Psychiatric:  Mood and Affect: Mood normal.        Behavior: Behavior normal.        Thought Content: Thought content normal.        Judgment: Judgment normal.   BP 126/70 (BP Location: Left Arm, Patient Position: Sitting, Cuff Size: Normal)   Pulse 83   Temp 98 F (36.7 C) (Oral)   Resp 16   Ht 5\' 8"  (1.727 m)   Wt 172 lb 9.6 oz (78.3 kg)   SpO2 98%   BMI 26.24 kg/m  Wt Readings from Last 3 Encounters:  06/05/23 172 lb 9.6 oz (78.3 kg)  05/02/23 181 lb 6.4 oz (82.3 kg)  12/03/22 216 lb 12.8 oz (98.3 kg)    Diabetic Foot Exam - Simple   No data filed    Lab Results  Component Value Date   WBC 7.5 06/05/2023   HGB 14.5 06/05/2023   HCT 44.7 06/05/2023   PLT 246.0 06/05/2023   GLUCOSE 94 06/05/2023   CHOL 237 (H) 06/05/2023   TRIG 111.0 06/05/2023   HDL 60.80 06/05/2023   LDLDIRECT 103.0 06/03/2017   LDLCALC 154 (H) 06/05/2023   ALT 10 06/05/2023   AST 12 06/05/2023   NA 137 06/05/2023   K 4.2 06/05/2023   CL 102 06/05/2023   CREATININE 0.99 06/05/2023   BUN 12 06/05/2023   CO2 27 06/05/2023   TSH 0.26 (L) 12/03/2022   HGBA1C 5.3 06/05/2023    Lab Results  Component Value Date   TSH 0.26 (L) 12/03/2022   Lab Results  Component Value Date   WBC 7.5 06/05/2023   HGB 14.5 06/05/2023   HCT 44.7 06/05/2023   MCV 93.5 06/05/2023   PLT 246.0 06/05/2023   Lab Results  Component Value Date   NA 137 06/05/2023   K 4.2 06/05/2023   CO2 27  06/05/2023   GLUCOSE 94 06/05/2023   BUN 12 06/05/2023   CREATININE 0.99 06/05/2023   BILITOT 0.7 06/05/2023   ALKPHOS 77 06/05/2023   AST 12 06/05/2023   ALT 10 06/05/2023   PROT 6.2 06/05/2023   ALBUMIN 4.0 06/05/2023   CALCIUM 9.5 06/05/2023   GFR 58.75 (L) 06/05/2023   Lab Results  Component Value Date   CHOL 237 (H) 06/05/2023   Lab Results  Component Value Date   HDL 60.80 06/05/2023   Lab Results  Component Value Date   LDLCALC 154 (H) 06/05/2023   Lab Results  Component Value Date   TRIG 111.0 06/05/2023   Lab Results  Component Value Date   CHOLHDL 4 06/05/2023   Lab Results  Component Value Date   HGBA1C 5.3 06/05/2023       Assessment & Plan:  Atrial fibrillation, unspecified type [facility]) Assessment & Plan: Tolerating current meds, rate controlled   Class 2 severe obesity with serious comorbidity and body mass index (BMI) of 36.0 to 36.9 in adult, unspecified obesity type (HCC) Assessment & Plan: Good weight loss with portion control and testosterone treatments.    Hyperglycemia Assessment & Plan: hgba1c acceptable, minimize simple carbs. Increase exercise as tolerated.   Orders: -     Hemoglobin A1c  Hyperlipidemia, unspecified hyperlipidemia type Assessment & Plan: Encourage heart healthy diet such as MIND or DASH diet, increase exercise, avoid trans fats, simple carbohydrates and processed foods, consider a krill or fish or flaxseed oil cap daily.   Orders: -     Lipid panel  Primary hypertension Assessment & Plan: Well controlled, no changes to  meds. Encouraged heart healthy diet such as the DASH diet and exercise as tolerated.   Orders: -     CBC with Differential/Platelet -     Comprehensive metabolic panel  Vitamin D deficiency Assessment & Plan: Supplement and monitor   Orders: -     VITAMIN D 25 Hydroxy (Vit-D Deficiency, Fractures)  Other orders -     oxyCODONE HCl; Take 1 tablet (10 mg total) by mouth 3 (three) times  daily as needed.  Dispense: 90 tablet; Refill: 0    Assessment and Plan    Hashimoto's Thyroiditis Patient expresses dissatisfaction with current thyroid management due to persistent symptoms of constipation, cold intolerance, and hair loss. Last TSH was low, suggesting possible overtreatment, but free T4 was within normal range. Discussed the complexity of thyroid hormone regulation and the potential cardiac risks of overtreatment. -Continue current thyroid medication regimen. -Encourage patient to discuss concerns with endocrinologist at upcoming appointment in October.  Chronic Constipation Despite Linzess use and adequate hydration, patient continues to experience constipation, with bowel movements occurring only once per week. Discussed potential contributing factors including age, weight loss, and oxycodone use. -Advise patient to take a laxative if no bowel movement by day three to prevent bowel stretching and potential diverticular disease.  Back Pain Patient reports intermittent back pain, managed with oxycodone as needed. Noted that oxycodone use can contribute to constipation. -Refill oxycodone prescription.  General Health Maintenance Patient plans to receive flu shot in early October. Discussed potential benefits of COVID-19 booster shot, but decision left to patient's discretion. -Order labs today including vitamin D, cholesterol, kidney function, liver function, and complete blood count. -Schedule follow-up appointment in six months.         Danise Edge, MD

## 2023-06-09 NOTE — Telephone Encounter (Signed)
See lab result notes

## 2023-06-09 NOTE — Telephone Encounter (Signed)
Called pt was advised  

## 2023-06-15 ENCOUNTER — Encounter: Payer: Self-pay | Admitting: Family Medicine

## 2023-06-15 ENCOUNTER — Telehealth: Payer: PPO | Admitting: Physician Assistant

## 2023-06-15 DIAGNOSIS — U071 COVID-19: Secondary | ICD-10-CM

## 2023-06-16 ENCOUNTER — Encounter: Payer: Self-pay | Admitting: Family Medicine

## 2023-06-16 ENCOUNTER — Telehealth (INDEPENDENT_AMBULATORY_CARE_PROVIDER_SITE_OTHER): Payer: PPO | Admitting: Family Medicine

## 2023-06-16 VITALS — Ht 68.0 in | Wt 172.0 lb

## 2023-06-16 DIAGNOSIS — U071 COVID-19: Secondary | ICD-10-CM | POA: Diagnosis not present

## 2023-06-16 MED ORDER — BENZONATATE 100 MG PO CAPS
100.0000 mg | ORAL_CAPSULE | Freq: Two times a day (BID) | ORAL | 0 refills | Status: DC | PRN
Start: 2023-06-16 — End: 2024-03-23

## 2023-06-16 NOTE — Progress Notes (Signed)
Thank you for the details you included in the comment boxes. Those details are very helpful in determining the best course of treatment for you and help [place] to provide the best care. Because you are Covid 19 positive, we recommend that you convert this visit to a video visit in order for the provider to better assess what is going on.  The provider will be able to give you a more accurate diagnosis and treatment plan if we can more freely discuss your symptoms and with the addition of a virtual examination.   If you convert to a video visit, we will bill your insurance (similar to an office visit) and you will not be charged for this e-Visit. You will be able to stay at home and speak with the first available [facility] advanced practice provider. The link to do a video visit is in the drop down Menu tab of your Welcome screen in MyChart.  You will need to schedule the video visit. You can do this through one of two ways:  1) Go into your MyChart App and select the "Menu" button, then select the "Virtual Urgent Care Visit" then proceed scheduling -OR- 2) Go to [email or web address] and select "Get Started" under the Virtual Urgent Care option, select "View all options", then select the "Schedule on your Time" and proceed with scheduling.  Best Regards,  Daiva Nakayama, PA-C  I have spent 5 minutes in review of e-visit questionnaire, review and updating patient chart, medical decision making and response to patient.   Margaretann Loveless, PA-C

## 2023-06-16 NOTE — Progress Notes (Signed)
Started Friday Cough Scratchy throat  Congestion Body aches Nausea  Covid + - yesterday  Tylenol - body aches/temp Mucinex D

## 2023-06-16 NOTE — Telephone Encounter (Signed)
Called pt and she has VV today.

## 2023-06-16 NOTE — Progress Notes (Signed)
Virtual Video Visit via MyChart Note  I connected with  Alexandra Patterson on 06/16/23 at 11:40 AM EDT by the video enabled telemedicine application for MyChart, and verified that I am speaking with the correct person using two identifiers.   I introduced myself as a Publishing rights manager with the practice. We discussed the limitations of evaluation and management by telemedicine and the availability of in person appointments. The patient expressed understanding and agreed to proceed.  Participating parties in this visit include: The patient and the nurse practitioner listed.  The patient is: At home I am: In the office - [facility]  Subjective:    CC:  Chief Complaint  Patient presents with   Covid Positive    HPI: Alexandra Patterson is a 67 y.o. year old female presenting today via MyChart today for COVID.  Discussed the use of AI scribe software for clinical note transcription with the patient, who gave verbal consent to proceed.  History of Present Illness   The patient tested positive for COVID-19. She began experiencing symptoms four days prior to the consultation, initially presenting as a cold with a scratchy throat and cough. The symptoms gradually worsened, with the addition of body aches, resembling flu-like symptoms. The patient has been managing the aches with Tylenol and has noted some improvement.  The patient's primary complaints include a persistent cough and a scratchy throat, which she has been managing with cough drops and warm soup. She has not been experiencing any chest pain or trouble breathing, but reports nasal congestion. She has been using nasal spray for relief, but have not yet taken Mucinex.  The patient also reports headaches, which she describe as different from their usual migraines, and have therefore not taken their migraine medication, Bernita Raisin, for an extended period. She has been taking Oxycodone nightly for chronic pain.  This is  the patient's first experience with COVID-19. She has been managing their symptoms at home and are interested in taking Paxlovid, an antiviral medication. However, due to potential interactions with their current medications, she have decided to continue with supportive measures.            Past medical history, Surgical history, Family history not pertinant except as noted below, Social history, Allergies, and medications have been entered into the medical record, reviewed, and corrections made.   Review of Systems:  All review of systems negative except what is listed in the HPI   Objective:    General:  Speaking clearly in complete sentences. Absent shortness of breath noted.   Alert and oriented x3.   Normal judgment.  Absent acute distress.   Impression and Recommendations:    1. COVID-19 - benzonatate (TESSALON) 100 MG capsule; Take 1 capsule (100 mg total) by mouth 2 (two) times daily as needed for cough.  Dispense: 20 capsule; Refill: 0      COVID-19 Positive test with symptoms of cough, sore throat, and body aches. No respiratory distress. Day 4 of symptoms. Discussed the use of Paxlovid, but contraindicated due to concurrent use of oxycodone, which she reports she cannot hold doses.  -Continue supportive measures including Tylenol for aches, Mucinex, and nasal spray. -Prescribe Tessalon Perles for cough control. -Encourage hydration and warm liquids, honey for throat soothing. -Advise patient to monitor symptoms and seek medical attention if worsening or not improving by day 8-10.         Follow-up if symptoms worsen or fail to improve.  I discussed the assessment and treatment plan with the patient. The patient was provided an opportunity to ask questions and all were answered. The patient agreed with the plan and demonstrated an understanding of the instructions.   The patient was advised to call back or seek an in-person evaluation if the symptoms worsen  or if the condition fails to improve as anticipated.   Clayborne Dana, NP

## 2023-07-02 ENCOUNTER — Ambulatory Visit (INDEPENDENT_AMBULATORY_CARE_PROVIDER_SITE_OTHER): Payer: PPO

## 2023-07-02 DIAGNOSIS — Z23 Encounter for immunization: Secondary | ICD-10-CM | POA: Diagnosis not present

## 2023-07-15 DIAGNOSIS — E673 Hypervitaminosis D: Secondary | ICD-10-CM | POA: Diagnosis not present

## 2023-07-15 DIAGNOSIS — E063 Autoimmune thyroiditis: Secondary | ICD-10-CM | POA: Diagnosis not present

## 2023-07-29 DIAGNOSIS — N3010 Interstitial cystitis (chronic) without hematuria: Secondary | ICD-10-CM | POA: Diagnosis not present

## 2023-08-14 DIAGNOSIS — G4700 Insomnia, unspecified: Secondary | ICD-10-CM | POA: Diagnosis not present

## 2023-08-14 DIAGNOSIS — Z6826 Body mass index (BMI) 26.0-26.9, adult: Secondary | ICD-10-CM | POA: Diagnosis not present

## 2023-08-14 DIAGNOSIS — N951 Menopausal and female climacteric states: Secondary | ICD-10-CM | POA: Diagnosis not present

## 2023-08-14 DIAGNOSIS — Z79890 Hormone replacement therapy (postmenopausal): Secondary | ICD-10-CM | POA: Diagnosis not present

## 2023-08-14 DIAGNOSIS — N898 Other specified noninflammatory disorders of vagina: Secondary | ICD-10-CM | POA: Diagnosis not present

## 2023-08-21 ENCOUNTER — Other Ambulatory Visit: Payer: Self-pay | Admitting: Family Medicine

## 2023-08-22 ENCOUNTER — Telehealth: Payer: Self-pay | Admitting: Family Medicine

## 2023-08-22 NOTE — Telephone Encounter (Signed)
Shirlia from [facility] called to request refills on the following medications:    DULoxetine (CYMBALTA) 60 MG capsule   losartan (COZAAR) 100 MG tablet   meloxicam (MOBIC) 15 MG tablet  metoprolol succinate (TOPROL-XL) 100 MG 24 hr tablet   metoprolol succinate (TOPROL-XL) 50 MG 24 hr tablet  UBRELVY 100 MG TABS   busPIRone (BUSPAR) 10 MG tablet

## 2023-08-22 NOTE — Telephone Encounter (Signed)
Spoke with patient and she stated that everything has been taken care of.

## 2023-08-22 NOTE — Telephone Encounter (Signed)
Pt called stating that she was having issues with getting her medication refilled per her pharmacy. Per chart review, it looks like all medications requested were sent in other than meloxicam and ubrelvy. Advised a note would be sent back to look into this.

## 2023-09-03 ENCOUNTER — Other Ambulatory Visit: Payer: Self-pay | Admitting: Family Medicine

## 2023-09-03 MED ORDER — OXYCODONE HCL 10 MG PO TABS: 10.0000 mg | ORAL_TABLET | Freq: Three times a day (TID) | ORAL | 0 refills | Status: DC | PRN

## 2023-09-03 NOTE — Telephone Encounter (Signed)
Requesting: Oxycodone HCI 10 mg Tablet Contract: 05/28/2022 UDS: 05/28/2022 Last Visit: 06/05/2023 Next Visit: 12/04/2023 Last Refill:06/05/2023  Please Advise    Preferred pharmacy: [id number] - [place], [place] - [address] Delivery method: [employer]

## 2023-09-17 DIAGNOSIS — E673 Hypervitaminosis D: Secondary | ICD-10-CM | POA: Diagnosis not present

## 2023-10-14 ENCOUNTER — Encounter: Payer: Self-pay | Admitting: Family Medicine

## 2023-10-14 MED ORDER — ROSUVASTATIN CALCIUM 5 MG PO TABS: 5.0000 mg | ORAL_TABLET | Freq: Every day | ORAL | 1 refills | Status: DC

## 2023-10-24 ENCOUNTER — Other Ambulatory Visit (HOSPITAL_BASED_OUTPATIENT_CLINIC_OR_DEPARTMENT_OTHER): Payer: Self-pay | Admitting: Family Medicine

## 2023-10-24 DIAGNOSIS — Z1231 Encounter for screening mammogram for malignant neoplasm of breast: Secondary | ICD-10-CM

## 2023-10-30 ENCOUNTER — Ambulatory Visit (HOSPITAL_BASED_OUTPATIENT_CLINIC_OR_DEPARTMENT_OTHER)
Admission: RE | Admit: 2023-10-30 | Discharge: 2023-10-30 | Disposition: A | Discharge status: Home / Self Care | Payer: PPO | Source: Ambulatory Visit | Attending: Family Medicine | Admitting: Family Medicine

## 2023-10-30 ENCOUNTER — Encounter (HOSPITAL_BASED_OUTPATIENT_CLINIC_OR_DEPARTMENT_OTHER): Payer: Self-pay

## 2023-10-30 DIAGNOSIS — Z1231 Encounter for screening mammogram for malignant neoplasm of breast: Secondary | ICD-10-CM | POA: Diagnosis not present

## 2023-11-11 DIAGNOSIS — Z1212 Encounter for screening for malignant neoplasm of rectum: Secondary | ICD-10-CM | POA: Diagnosis not present

## 2023-11-11 DIAGNOSIS — Z1211 Encounter for screening for malignant neoplasm of colon: Secondary | ICD-10-CM | POA: Diagnosis not present

## 2023-11-11 DIAGNOSIS — K449 Diaphragmatic hernia without obstruction or gangrene: Secondary | ICD-10-CM | POA: Diagnosis not present

## 2023-11-20 ENCOUNTER — Encounter: Payer: Self-pay | Admitting: Family Medicine

## 2023-11-20 ENCOUNTER — Other Ambulatory Visit: Payer: Self-pay | Admitting: Family

## 2023-11-20 MED ORDER — OXYCODONE HCL 10 MG PO TABS: 10.0000 mg | ORAL_TABLET | Freq: Three times a day (TID) | ORAL | 0 refills | Status: DC | PRN

## 2023-11-20 NOTE — Telephone Encounter (Signed)
 Requesting: Oxycodone HCI 10 mg Tabs Contract: 06/11/2022 UDS: 05/08/2022 Last Visit: 06/05/2023 Next Visit: 12/04/2023 Last Refill: 09/03/2023  Please Advise

## 2023-11-25 DIAGNOSIS — G43009 Migraine without aura, not intractable, without status migrainosus: Secondary | ICD-10-CM | POA: Diagnosis not present

## 2023-11-30 NOTE — Assessment & Plan Note (Signed)
 Well controlled, no changes to meds. Encouraged heart healthy diet such as the DASH diet and exercise as tolerated.

## 2023-11-30 NOTE — Assessment & Plan Note (Signed)
 [id number] acceptable, minimize simple carbs. Increase exercise as tolerated.

## 2023-11-30 NOTE — Assessment & Plan Note (Signed)
Tolerating current meds, rate controlled

## 2023-11-30 NOTE — Assessment & Plan Note (Signed)
>>  ASSESSMENT AND PLAN FOR CLASS 2 SEVERE OBESITY WITH SERIOUS COMORBIDITY AND BODY MASS INDEX (BMI) OF 36.0 TO 36.9 IN ADULT, UNSPECIFIED OBESITY TYPE (HCC) WRITTEN ON 11/30/2023  7:46 PM BY BLYTH, STACEY A, MD  Good weight loss with portion control and testosterone  treatments.

## 2023-11-30 NOTE — Assessment & Plan Note (Signed)
Good weight loss with portion control and testosterone treatments.

## 2023-11-30 NOTE — Assessment & Plan Note (Signed)
 Supplement and monitor

## 2023-11-30 NOTE — Assessment & Plan Note (Signed)
 Encourage heart healthy diet such as MIND or DASH diet, increase exercise, avoid trans fats, simple carbohydrates and processed foods, consider a krill or fish or flaxseed oil cap daily.

## 2023-12-04 ENCOUNTER — Encounter: Payer: Self-pay | Admitting: Family Medicine

## 2023-12-04 ENCOUNTER — Ambulatory Visit: Payer: PPO | Admitting: Family Medicine

## 2023-12-04 VITALS — BP 150/94 | HR 69 | Temp 98.5°F | Resp 16 | Ht 67.0 in | Wt 193.2 lb

## 2023-12-04 DIAGNOSIS — R739 Hyperglycemia, unspecified: Secondary | ICD-10-CM | POA: Diagnosis not present

## 2023-12-04 DIAGNOSIS — I4891 Unspecified atrial fibrillation: Secondary | ICD-10-CM

## 2023-12-04 DIAGNOSIS — I10 Essential (primary) hypertension: Secondary | ICD-10-CM | POA: Diagnosis not present

## 2023-12-04 DIAGNOSIS — E66812 Obesity, class 2: Secondary | ICD-10-CM

## 2023-12-04 DIAGNOSIS — E785 Hyperlipidemia, unspecified: Secondary | ICD-10-CM | POA: Diagnosis not present

## 2023-12-04 DIAGNOSIS — E559 Vitamin D deficiency, unspecified: Secondary | ICD-10-CM | POA: Diagnosis not present

## 2023-12-04 DIAGNOSIS — Z6836 Body mass index (BMI) 36.0-36.9, adult: Secondary | ICD-10-CM | POA: Diagnosis not present

## 2023-12-04 DIAGNOSIS — Z0000 Encounter for general adult medical examination without abnormal findings: Secondary | ICD-10-CM

## 2023-12-04 DIAGNOSIS — L578 Other skin changes due to chronic exposure to nonionizing radiation: Secondary | ICD-10-CM

## 2023-12-04 NOTE — Patient Instructions (Addendum)
 Netflix: live to 100 the secrets of the [employer] lighting for Seasonal Affective Disorder Consider monitoring roughly weekly Blood pressure and pulse  Preventive Care 65 Years and Older, Female Preventive care refers to lifestyle choices and visits with your health care provider that can promote health and wellness. Preventive care visits are also called wellness exams. What can I expect for my preventive care visit? Counseling Your health care provider may ask you questions about your: Medical history, including: Past medical problems. Family medical history. Pregnancy and menstrual history. History of falls. Current health, including: Memory and ability to understand (cognition). Emotional well-being. Home life and relationship well-being. Sexual activity and sexual health. Lifestyle, including: Alcohol, nicotine or tobacco, and drug use. Access to firearms. Diet, exercise, and sleep habits. Work and work Astronomer. Sunscreen use. Safety issues such as seatbelt and bike helmet use. Physical exam Your health care provider will check your: Height and weight. These may be used to calculate your BMI (body mass index). BMI is a measurement that tells if you are at a healthy weight. Waist circumference. This measures the distance around your waistline. This measurement also tells if you are at a healthy weight and may help predict your risk of certain diseases, such as type 2 diabetes and high blood pressure. Heart rate and blood pressure. Body temperature. Skin for abnormal spots. What immunizations do I need?  Vaccines are usually given at various ages, according to a schedule. Your health care provider will recommend vaccines for you based on your age, medical history, and lifestyle or other factors, such as travel or where you work. What tests do I need? Screening Your health care provider may recommend screening tests for certain conditions. This may include: Lipid  and cholesterol levels. Hepatitis C test. Hepatitis B test. HIV (human immunodeficiency virus) test. STI (sexually transmitted infection) testing, if you are at risk. Lung cancer screening. Colorectal cancer screening. Diabetes screening. This is done by checking your blood sugar (glucose) after you have not eaten for a while (fasting). Mammogram. Talk with your health care provider about how often you should have regular mammograms. BRCA-related cancer screening. This may be done if you have a family history of breast, ovarian, tubal, or peritoneal cancers. Bone density scan. This is done to screen for osteoporosis. Talk with your health care provider about your test results, treatment options, and if necessary, the need for more tests. Follow these instructions at home: Eating and drinking  Eat a diet that includes fresh fruits and vegetables, whole grains, lean protein, and low-fat dairy products. Limit your intake of foods with high amounts of sugar, saturated fats, and salt. Take vitamin and mineral supplements as recommended by your health care provider. Do not drink alcohol if your health care provider tells you not to drink. If you drink alcohol: Limit how much you have to 0-1 drink a day. Know how much alcohol is in your drink. In the [place] one drink equals one 12 oz bottle of beer (355 mL), one 5 oz glass of wine (148 mL), or one 1 oz glass of hard liquor (44 mL). Lifestyle Brush your teeth every morning and night with fluoride toothpaste. Floss one time each day. Exercise for at least 30 minutes 5 or more days each week. Do not use any products that contain nicotine or tobacco. These products include cigarettes, chewing tobacco, and vaping devices, such as e-cigarettes. If you need help quitting, ask your health care provider. Do not use drugs. If you  are sexually active, practice safe sex. Use a condom or other form of protection in order to prevent STIs. Take aspirin only as  told by your health care provider. Make sure that you understand how much to take and what form to take. Work with your health care provider to find out whether it is safe and beneficial for you to take aspirin daily. Ask your health care provider if you need to take a cholesterol-lowering medicine (statin). Find healthy ways to manage stress, such as: Meditation, yoga, or listening to music. Journaling. Talking to a trusted person. Spending time with friends and family. Minimize exposure to UV radiation to reduce your risk of skin cancer. Safety Always wear your seat belt while driving or riding in a vehicle. Do not drive: If you have been drinking alcohol. Do not ride with someone who has been drinking. When you are tired or distracted. While texting. If you have been using any mind-altering substances or drugs. Wear a helmet and other protective equipment during sports activities. If you have firearms in your house, make sure you follow all gun safety procedures. What's next? Visit your health care provider once a year for an annual wellness visit. Ask your health care provider how often you should have your eyes and teeth checked. Stay up to date on all vaccines. This information is not intended to replace advice given to you by your health care provider. Make sure you discuss any questions you have with your health care provider. Document Revised: 03/14/2021 Document Reviewed: 03/14/2021 Elsevier Patient Education  2024 [employer].

## 2023-12-04 NOTE — Assessment & Plan Note (Signed)
Patient encouraged to maintain heart healthy diet, regular exercise, adequate sleep. Consider daily probiotics. Take medications as prescribed. Labs ordered and reviewed. Given and reviewed copy of ACP documents from [place] Secretary of State and encouraged to complete and return 

## 2023-12-04 NOTE — Progress Notes (Signed)
 Subjective:    Patient ID: Alexandra Patterson, female    DOB: Feb 15, 1956, 68 y.o.   MRN: [id number]  Chief Complaint  Patient presents with  . Annual Exam    HPI Discussed the use of AI scribe software for clinical note transcription with the patient, who gave verbal consent to proceed.  History of Present Illness Alexandra Patterson is a 68 year old female with hypertension who presents for a blood pressure check.  She has hypertension with persistently elevated blood pressure despite previous checks. She owns a blood pressure monitor but does not use it regularly. Her husband routinely checks his own blood pressure, but she has not established a similar routine.  She experiences increased pain due to cold weather, which she attributes to fibromyalgia. The pain is widespread, affecting her finger with arthritis, her hip after shopping, and her shoulder that requires surgical intervention. The weather significantly exacerbates her symptoms.  She reports experiencing extra heartbeats, which have worsened and changed location according to her cardiologist. She is aware of these palpitations but does not experience shortness of breath or syncope. She has not yet seen her new cardiologist.  She has been experiencing stress due to several personal losses, including the recent deaths of a niece from breast cancer and a favorite uncle. Additionally, she is dealing with her son's alcoholism, which he does not acknowledge, and her mother's recent fall, which resulted in a hematoma due to her being on blood thinners. These events have contributed to her depression and weight gain, as she tends to eat more when depressed.  She is taking thyroid supplements in addition to her prescribed levothyroxine, which she continues to take as directed. She has also started taking a supplement called Grunz, which has alleviated her constipation. She acknowledges not drinking enough water and has been trying to improve her diet  by focusing on protein intake, including tuna, chicken, and nuts. She roasts her own nuts to avoid preservatives.    Past Medical History:  Diagnosis Date  . Allergy   . Anxiety   . Aortic stenosis   . Atrial fibrillation (HCC)   . Back pain   . Bilateral swelling of feet   . Bursitis   . Cervical cancer screening 09/07/2015   Menarche at 11 Irregular and heavy and painful flow secondary to fibroids No history of abnormal pap in past, last pap roughly 2003 G2P2, s/p 2 SVD history of abnormal MGM, 1 abnl bx right breast benign, normal otherwise Noconcerns today TAH b/l SPO for fibroids and migrainesand breast bx on right gyn surgeries  . Constipation   . Cough 08/13/2015  . Depression   . Depression with anxiety 06/29/2016  . Fainting    once to due heart out of rythm  . Fibromyalgia   . Frequent headaches   . GERD (gastroesophageal reflux disease)   . 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 Heartburn   . History of blood clots   . History of chicken pox   . Hx of blood clots    dvt  . Hyperlipidemia   . Hyperplastic colon polyp   . Hypertension   . Hypothyroid   . Insomnia 08/13/2015  . Internal hemorrhoids   . Interstitial cystitis   . Joint pain   . Lesion of lung    xray in 2014 thought to be benign seen at [facility] pulmonology  . Measles    h/o  . Migraine   . Obesity   . Osteoarthritis   .  Preventative health care 02/03/2017  . Raynaud's disease   . S/P AVR (aortic valve replacement) 03/19/2015  . Shoulder pain 06/03/2017  . UTI (lower urinary tract infection)   . UTI (urinary tract infection) 02/11/2016    Past Surgical History:  Procedure Laterality Date  . ABDOMINAL HYSTERECTOMY     TAH SPO  . AORTIC VALVE REPLACEMENT  2014   with [facility]  . ATRIAL ABLATION SURGERY    . BLADDER SUSPENSION    . BREAST BIOPSY Right    Needle Biopsy  . COLONOSCOPY WITH PROPOFOL N/A 12/28/2020   Procedure: COLONOSCOPY WITH PROPOFOL;  Surgeon: Pasty Spillers, MD;  Location: [facility]  ENDOSCOPY;  Service: Endoscopy;  Laterality: N/A;  . CORONARY ARTERY BYPASS GRAFT    . DILATION AND CURETTAGE OF UTERUS     x 3  . ESOPHAGOGASTRODUODENOSCOPY (EGD) WITH PROPOFOL N/A 12/28/2020   Procedure: ESOPHAGOGASTRODUODENOSCOPY (EGD) WITH PROPOFOL;  Surgeon: Pasty Spillers, MD;  Location: [facility] ENDOSCOPY;  Service: Endoscopy;  Laterality: N/A;  . loop heart    . SHOULDER SURGERY Left    arthroscopy for spurs  . TONSILLECTOMY    . TUBAL LIGATION      Family History  Problem Relation Age of Onset  . Heart disease Mother        congestive heart failure  . Arthritis Mother        s/p TKR  . Neuropathy Mother   . Hyperlipidemia Mother   . Other Mother        familial mediterranean fever  . Thyroid disease Mother   . Hypertension Mother   . Obesity Mother   . Colon polyps Father   . Alzheimer's disease Father   . Alcohol abuse Father   . Cancer Father        colon cancer  . Cancer Brother        prostate cancer  . Heart disease Brother        cardiomegaly  . Other Brother        familial mediterranean fever  . Interstitial cystitis Daughter   . Arthritis Son   . Alcohol abuse Son        in remission  . Mental illness Son        depression  . Other Son        interstitial cystitis  . Cancer Paternal Uncle   . Arthritis Paternal Uncle   . Asthma Maternal Grandmother   . Congestive Heart Failure Maternal Grandmother   . Heart disease Maternal Grandmother        chf  . Stroke Maternal Grandfather   . Diabetes Maternal Grandfather   . Heart disease Maternal Grandfather        hardening of the arteries  . Stroke Paternal Grandfather   . Atrial fibrillation Paternal Grandfather   . Alcohol abuse Paternal Grandfather   . Heart disease Paternal Grandfather        afib  . Colon cancer Other        parent    Social History   Socioeconomic History  . Marital status: Married    Spouse name: Not on file  . Number of children: 2  . Years of education: Not on  file  . Highest education level: Bachelor's degree (e.g., BA, AB, BS)  Occupational History  . Occupation: Public house manager: [employer]  Tobacco Use  . Smoking status: Never  . Smokeless tobacco: Never  Substance and Sexual Activity  .  Alcohol use: No  . Drug use: No  . Sexual activity: Yes    Comment: lives with husband and adult son, no major dietary restrictions, full diability  Other Topics Concern  . Not on file  Social History Narrative  . Not on file   Social Drivers of Health   Financial Resource Strain: Low Risk  (05/02/2023)   Overall Financial Resource Strain (CARDIA)   . Difficulty of Paying Living Expenses: Not hard at all  Food Insecurity: Low Risk  (07/29/2023)   Received from [facility]   Hunger Vital Sign   . Worried About Programme researcher, broadcasting/film/video in the Last Year: Never true   . Ran Out of Food in the Last Year: Never true  Transportation Needs: No Transportation Needs (07/29/2023)   Received from [facility]   . In the past 12 months, has lack of reliable transportation kept you from medical appointments, meetings, work or from getting things needed for daily living? : No  Physical Activity: Inactive (05/02/2023)   Exercise Vital Sign   . Days of Exercise per Week: 0 days   . Minutes of Exercise per Session: 0 min  Stress: No Stress Concern Present (05/02/2023)   [employer] of Occupational Health - Occupational Stress Questionnaire   . Feeling of Stress : Only a little  Social Connections: Moderately Integrated (05/02/2023)   Social Connection and Isolation Panel [NHANES]   . Frequency of Communication with Friends and Family: More than three times a week   . Frequency of Social Gatherings with Friends and Family: Once a week   . Attends Religious Services: More than 4 times per year   . Active Member of Clubs or Organizations: No   . Attends Banker Meetings: Never   . Marital Status: Married  Careers information officer Violence: Not At Risk (05/02/2023)   Humiliation, Afraid, Rape, and Kick questionnaire   . Fear of Current or Ex-Partner: No   . Emotionally Abused: No   . Physically Abused: No   . Sexually Abused: No    Outpatient Medications Prior to Visit  Medication Sig Dispense Refill  . amitriptyline (ELAVIL) 25 MG tablet Take 1 tablet (25 mg total) by mouth at bedtime.    . benzonatate (TESSALON) 100 MG capsule Take 1 capsule (100 mg total) by mouth 2 (two) times daily as needed for cough. 20 capsule 0  . busPIRone (BUSPAR) 10 MG tablet Take 1 tablet (10 mg total) by mouth 2 (two) times daily. 180 tablet 1  . Cholecalciferol (VITAMIN D3) 75 MCG (3000 UT) TABS Take 1 capsule by mouth daily.    . cyclobenzaprine (FLEXERIL) 5 MG tablet Take 5 mg by mouth 3 (three) times daily as needed for muscle spasms.    . DULoxetine (CYMBALTA) 60 MG capsule Take 2 capsules (120 mg total) by mouth in the morning. 180 capsule 1  . esomeprazole (NEXIUM) 40 MG capsule Take 40 mg by mouth daily at 12 noon.    . hydrOXYzine (ATARAX/VISTARIL) 25 MG tablet Take 1 tablet (25 mg total) by mouth at bedtime as needed. 90 tablet 1  . levothyroxine (SYNTHROID) 125 MCG tablet TAKE ONE TABLET BY MOUTH ONCE WEEKLY BEFORE BREAKFAST ON MONDAY and TAKE ONE TABLET BY MOUTH ONCE WEEKLY BEFORE BREAKFAST ON  TUESDAYS and TAKE ONE TABLET BY MOUTH ONCE WEEKLY BEFORE BREAKFAST ON WEDNESDAYS and TAKE ONE TABLET BY MOUTH ONCE WEEKLY BEFORE BREAKFAST ON THURSDAYS and TAKE ONE TABLET BY MOUTH ONCE  WEEKLY BEFORE BREAKFAST ON FRIDAYS and TAKE ONE TABLET BY MOUTH ONCE WEEKLY BEFORE BREAKFAST ON SATURDAY 78 tablet 1  . liothyronine (CYTOMEL) 5 MCG tablet Take by mouth.    . losartan (COZAAR) 100 MG tablet Take 1 tablet (100 mg total) by mouth every morning. 90 tablet 1  . Magnesium 400 MG CAPS Take 400 mg by mouth daily. Reported on 02/13/2016    . meloxicam (MOBIC) 15 MG tablet TAKE 1 TABLET BY MOUTH EVERY MORNING 30 tablet 10  .  Methen-Hyosc-Meth Blue-Na Phos (UROGESIC-BLUE) 81.6 MG TABS Take 1 tablet (81.6 mg total) by mouth 4 (four) times daily as needed.    . metoprolol succinate (TOPROL-XL) 100 MG 24 hr tablet Take 1 tablet (100 mg total) by mouth every morning. Take with 50mg  tablet to equal 150mg  daily 90 tablet 1  . ondansetron (ZOFRAN) 4 MG tablet Take 4 mg by mouth every 8 (eight) hours as needed for nausea or vomiting.    . Oxycodone HCl 10 MG TABS Take 1 tablet (10 mg total) by mouth 3 (three) times daily as needed. 90 tablet 0  . rosuvastatin (CRESTOR) 5 MG tablet Take 1 tablet (5 mg total) by mouth daily. 90 tablet 1  . UBRELVY 100 MG TABS Take by mouth.    . zonisamide (ZONEGRAN) 100 MG capsule Take 200 mg by mouth 2 (two) times daily.    Marland Kitchen LINZESS 145 MCG CAPS capsule     . metoprolol succinate (TOPROL-XL) 50 MG 24 hr tablet Take 1 tablet (50 mg total) by mouth every morning. Take with 100mg  tablet to equal 150mg  daily 90 tablet 1   No facility-administered medications prior to visit.    Allergies  Allergen Reactions  . Ciprofloxacin Rash and Shortness Of Breath  . Sulfa Antibiotics Hives  . Sulfasalazine Hives  . Cortisone Other (See Comments)    Red flush and anxiety  . Tramadol Other (See Comments)    headache  . Zonisamide Other (See Comments)    unk  . Erythromycin Rash  . Erythromycin Base Rash  . Levofloxacin Nausea Only  . Lisinopril     Other reaction(s): Cough (ALLERGY/intolerance)  . Prednisone Rash and Anxiety    Review of Systems  Constitutional:  Positive for malaise/fatigue. Negative for fever.  HENT:  Negative for congestion.   Eyes:  Negative for blurred vision.  Respiratory:  Negative for shortness of breath.   Cardiovascular:  Negative for chest pain, palpitations and leg swelling.  Gastrointestinal:  Negative for abdominal pain, blood in stool and nausea.  Genitourinary:  Negative for dysuria and frequency.  Musculoskeletal:  Positive for back pain, joint pain and  myalgias. Negative for falls.  Skin:  Negative for rash.  Neurological:  Negative for dizziness, loss of consciousness and headaches.  Endo/Heme/Allergies:  Negative for environmental allergies.  Psychiatric/Behavioral:  Positive for depression. The patient is nervous/anxious.       Objective:    Physical Exam Constitutional:      General: She is not in acute distress.    Appearance: Normal appearance. She is well-developed. She is not toxic-appearing.  HENT:     Head: Normocephalic and atraumatic.     Right Ear: External ear normal.     Left Ear: External ear normal.     Nose: Nose normal.  Eyes:     General:        Right eye: No discharge.        Left eye: No discharge.  Conjunctiva/sclera: Conjunctivae normal.  Neck:     Thyroid: No thyromegaly.  Cardiovascular:     Rate and Rhythm: Normal rate and regular rhythm.     Heart sounds: Normal heart sounds. No murmur heard. Pulmonary:     Effort: Pulmonary effort is normal. No respiratory distress.     Breath sounds: Normal breath sounds.  Abdominal:     General: Bowel sounds are normal.     Palpations: Abdomen is soft.     Tenderness: There is no abdominal tenderness. There is no guarding.  Musculoskeletal:        General: Normal range of motion.     Cervical back: Neck supple.  Lymphadenopathy:     Cervical: No cervical adenopathy.  Skin:    General: Skin is warm and dry.  Neurological:     Mental Status: She is alert and oriented to person, place, and time.  Psychiatric:        Mood and Affect: Mood normal.        Behavior: Behavior normal.        Thought Content: Thought content normal.        Judgment: Judgment normal.   BP (!) 150/94 (BP Location: Left Arm, Patient Position: Sitting, Cuff Size: Large)   Pulse 69   Temp 98.5 F (36.9 C) (Oral)   Resp 16   Ht 5\' 7"  (1.702 m)   Wt 193 lb 3.2 oz (87.6 kg)   SpO2 98%   BMI 30.26 kg/m  Wt Readings from Last 3 Encounters:  12/04/23 193 lb 3.2 oz (87.6 kg)   06/16/23 172 lb (78 kg)  06/05/23 172 lb 9.6 oz (78.3 kg)    Diabetic Foot Exam - Simple   No data filed    Lab Results  Component Value Date   WBC 7.5 06/05/2023   HGB 14.5 06/05/2023   HCT 44.7 06/05/2023   PLT 246.0 06/05/2023   GLUCOSE 94 06/05/2023   CHOL 237 (H) 06/05/2023   TRIG 111.0 06/05/2023   HDL 60.80 06/05/2023   LDLDIRECT 103.0 06/03/2017   LDLCALC 154 (H) 06/05/2023   ALT 10 06/05/2023   AST 12 06/05/2023   NA 137 06/05/2023   K 4.2 06/05/2023   CL 102 06/05/2023   CREATININE 0.99 06/05/2023   BUN 12 06/05/2023   CO2 27 06/05/2023   TSH 0.26 (L) 12/03/2022   HGBA1C 5.3 06/05/2023    Lab Results  Component Value Date   TSH 0.26 (L) 12/03/2022   Lab Results  Component Value Date   WBC 7.5 06/05/2023   HGB 14.5 06/05/2023   HCT 44.7 06/05/2023   MCV 93.5 06/05/2023   PLT 246.0 06/05/2023   Lab Results  Component Value Date   NA 137 06/05/2023   K 4.2 06/05/2023   CO2 27 06/05/2023   GLUCOSE 94 06/05/2023   BUN 12 06/05/2023   CREATININE 0.99 06/05/2023   BILITOT 0.7 06/05/2023   ALKPHOS 77 06/05/2023   AST 12 06/05/2023   ALT 10 06/05/2023   PROT 6.2 06/05/2023   ALBUMIN 4.0 06/05/2023   CALCIUM 9.5 06/05/2023   GFR 58.75 (L) 06/05/2023   Lab Results  Component Value Date   CHOL 237 (H) 06/05/2023   Lab Results  Component Value Date   HDL 60.80 06/05/2023   Lab Results  Component Value Date   LDLCALC 154 (H) 06/05/2023   Lab Results  Component Value Date   TRIG 111.0 06/05/2023   Lab Results  Component Value Date  CHOLHDL 4 06/05/2023   Lab Results  Component Value Date   HGBA1C 5.3 06/05/2023       Assessment & Plan:  Hyperglycemia Assessment & Plan: hgba1c acceptable, minimize simple carbs. Increase exercise as tolerated.   Orders: -     Hemoglobin A1c  Hyperlipidemia, unspecified hyperlipidemia type Assessment & Plan: Encourage heart healthy diet such as MIND or DASH diet, increase exercise, avoid  trans fats, simple carbohydrates and processed foods, consider a krill or fish or flaxseed oil cap daily.   Orders: -     Comprehensive metabolic panel -     Lipid panel  Primary hypertension Assessment & Plan: Well controlled, no changes to meds. Encouraged heart healthy diet such as the DASH diet and exercise as tolerated.   Orders: -     CBC with Differential/Platelet -     Comprehensive metabolic panel -     TSH  Vitamin D deficiency Assessment & Plan: Supplement and monitor   Orders: -     VITAMIN D 25 Hydroxy (Vit-D Deficiency, Fractures)  Class 2 severe obesity with serious comorbidity and body mass index (BMI) of 36.0 to 36.9 in adult, unspecified obesity type (HCC) Assessment & Plan: Good weight loss with portion control and testosterone treatments.    Atrial fibrillation, unspecified type [facility]) Assessment & Plan: Tolerating current meds, rate controlled   Sun-damaged skin -     Ambulatory referral to Dermatology  Preventative health care Assessment & Plan: Patient encouraged to maintain heart healthy diet, regular exercise, adequate sleep. Consider daily probiotics. Take medications as prescribed. Labs ordered and reviewed. Given and reviewed copy of ACP documents from [facility] Secretary of State and encouraged to complete and return      Assessment and Plan Assessment & Plan Hypertension Blood pressure elevated, possibly due to white coat syndrome or stress. Aim to treat based on home readings. - Monitor blood pressure at home weekly in resting state. - Record blood pressure and pulse. - Report home readings in a few weeks. - Adjust treatment based on home readings.  Cardiac arrhythmia Atrial fibrillation and ectopic beats worsened. No syncope or significant symptoms. Previous cardiologist indicated no immediate danger. - Monitor for arrhythmia symptoms. - Follow up with new cardiologist.  Fibromyalgia Chronic pain exacerbated by weather changes,  affecting joints and muscles.  Depression Depression worsened by personal losses, stressors, and seasonal affective disorder. Discussed importance of social interaction. - Increase social interactions. - Consider Verilux light for seasonal affective disorder.  Thyroid disorder On levothyroxine and supplements. Symptoms improved but cold intolerance and hair loss persist. - Continue levothyroxine and supplements.  Constipation Resolved with Grunz gummies. - Continue Grunz gummies.  Headaches Attributed to weather changes. Medication available. - Use medication as needed.  Skin lesions Multiple lesions including seborrheic keratosis and cherry angiomas. Monitoring advised. - Refer to dermatologist for evaluation.  Goals of Care Advanced directives and healthcare power of attorney not completed. Discussed importance. - Complete advanced directives and healthcare power of attorney paperwork.  Follow-up Plan based on blood pressure readings. Dermatology referral initiated. - Schedule follow-up in four months. - Follow up with dermatology referral.     Danise Edge, MD

## 2023-12-05 LAB — HEMOGLOBIN A1C: Hgb A1c MFr Bld: 5.3 % (ref 4.6–6.5)

## 2023-12-05 LAB — LIPID PANEL
Cholesterol: 194 mg/dL (ref 0–200)
HDL: 79.3 mg/dL (ref >39.00)
LDL Cholesterol: 102 mg/dL — ABNORMAL HIGH (ref 0–99)
NonHDL: 114.31
Total CHOL/HDL Ratio: 2
Triglycerides: 63 mg/dL (ref 0.0–149.0)
VLDL: 12.6 mg/dL (ref 0.0–40.0)

## 2023-12-05 LAB — CBC WITH DIFFERENTIAL/PLATELET
Basophils Absolute: 0.1 10*3/uL (ref 0.0–0.1)
Basophils Relative: 1.3 % (ref 0.0–3.0)
Eosinophils Absolute: 0.1 10*3/uL (ref 0.0–0.7)
Eosinophils Relative: 2 % (ref 0.0–5.0)
HCT: 42.2 % (ref 36.0–46.0)
Hemoglobin: 13.7 g/dL (ref 12.0–15.0)
Lymphocytes Relative: 26.5 % (ref 12.0–46.0)
Lymphs Abs: 1.6 10*3/uL (ref 0.7–4.0)
MCHC: 32.5 g/dL (ref 30.0–36.0)
MCV: 92.8 fl (ref 78.0–100.0)
Monocytes Absolute: 0.3 10*3/uL (ref 0.1–1.0)
Monocytes Relative: 5.5 % (ref 3.0–12.0)
Neutro Abs: 3.9 10*3/uL (ref 1.4–7.7)
Neutrophils Relative %: 64.7 % (ref 43.0–77.0)
Platelets: 186 10*3/uL (ref 150.0–400.0)
RBC: 4.55 Mil/uL (ref 3.87–5.11)
RDW: 15.8 % — ABNORMAL HIGH (ref 11.5–15.5)
WBC: 6.1 10*3/uL (ref 4.0–10.5)

## 2023-12-05 LAB — COMPREHENSIVE METABOLIC PANEL
ALT: 12 U/L (ref 0–35)
AST: 15 U/L (ref 0–37)
Albumin: 4.4 g/dL (ref 3.5–5.2)
Alkaline Phosphatase: 55 U/L (ref 39–117)
BUN: 20 mg/dL (ref 6–23)
CO2: 27 meq/L (ref 19–32)
Calcium: 9.3 mg/dL (ref 8.4–10.5)
Chloride: 103 meq/L (ref 96–112)
Creatinine, Ser: 1.01 mg/dL (ref 0.40–1.20)
GFR: 57.16 mL/min — ABNORMAL LOW (ref >60.00)
Glucose, Bld: 88 mg/dL (ref 70–99)
Potassium: 3.9 meq/L (ref 3.5–5.1)
Sodium: 140 meq/L (ref 135–145)
Total Bilirubin: 0.6 mg/dL (ref 0.2–1.2)
Total Protein: 6.2 g/dL (ref 6.0–8.3)

## 2023-12-05 LAB — VITAMIN D 25 HYDROXY (VIT D DEFICIENCY, FRACTURES): VITD: 73.77 ng/mL (ref 30.00–100.00)

## 2023-12-05 LAB — TSH: TSH: 0.36 u[IU]/mL (ref 0.35–5.50)

## 2023-12-08 ENCOUNTER — Encounter: Payer: Self-pay | Admitting: Family Medicine

## 2023-12-12 DIAGNOSIS — R6882 Decreased libido: Secondary | ICD-10-CM | POA: Diagnosis not present

## 2023-12-12 DIAGNOSIS — Z79890 Hormone replacement therapy (postmenopausal): Secondary | ICD-10-CM | POA: Diagnosis not present

## 2023-12-12 DIAGNOSIS — N951 Menopausal and female climacteric states: Secondary | ICD-10-CM | POA: Diagnosis not present

## 2023-12-12 DIAGNOSIS — M2550 Pain in unspecified joint: Secondary | ICD-10-CM | POA: Diagnosis not present

## 2023-12-17 DIAGNOSIS — L814 Other melanin hyperpigmentation: Secondary | ICD-10-CM | POA: Diagnosis not present

## 2023-12-17 DIAGNOSIS — L821 Other seborrheic keratosis: Secondary | ICD-10-CM | POA: Diagnosis not present

## 2023-12-17 DIAGNOSIS — X32XXXS Exposure to sunlight, sequela: Secondary | ICD-10-CM | POA: Diagnosis not present

## 2023-12-17 DIAGNOSIS — Z129 Encounter for screening for malignant neoplasm, site unspecified: Secondary | ICD-10-CM | POA: Diagnosis not present

## 2023-12-17 DIAGNOSIS — L720 Epidermal cyst: Secondary | ICD-10-CM | POA: Diagnosis not present

## 2023-12-17 DIAGNOSIS — D224 Melanocytic nevi of scalp and neck: Secondary | ICD-10-CM | POA: Diagnosis not present

## 2023-12-30 DIAGNOSIS — K5902 Outlet dysfunction constipation: Secondary | ICD-10-CM | POA: Diagnosis not present

## 2023-12-31 DIAGNOSIS — K5902 Outlet dysfunction constipation: Secondary | ICD-10-CM | POA: Diagnosis not present

## 2024-01-15 DIAGNOSIS — E063 Autoimmune thyroiditis: Secondary | ICD-10-CM | POA: Diagnosis not present

## 2024-01-15 DIAGNOSIS — E673 Hypervitaminosis D: Secondary | ICD-10-CM | POA: Diagnosis not present

## 2024-02-10 ENCOUNTER — Encounter: Payer: Self-pay | Admitting: Family Medicine

## 2024-02-11 MED ORDER — OXYCODONE HCL 10 MG PO TABS
10.0000 mg | ORAL_TABLET | Freq: Three times a day (TID) | ORAL | 0 refills | Status: DC | PRN
Start: 2024-02-11 — End: 2024-05-12

## 2024-02-12 ENCOUNTER — Encounter: Payer: Self-pay | Admitting: Family Medicine

## 2024-02-13 NOTE — Telephone Encounter (Signed)
 Called and spoke with patient and she is going to call pharmacy to see if they have her prescription

## 2024-02-19 ENCOUNTER — Other Ambulatory Visit (HOSPITAL_BASED_OUTPATIENT_CLINIC_OR_DEPARTMENT_OTHER): Payer: Self-pay

## 2024-02-19 ENCOUNTER — Other Ambulatory Visit (HOSPITAL_COMMUNITY): Payer: Self-pay

## 2024-02-25 ENCOUNTER — Other Ambulatory Visit: Payer: Self-pay | Admitting: Family Medicine

## 2024-03-10 ENCOUNTER — Encounter: Payer: Self-pay | Admitting: Family Medicine

## 2024-03-12 NOTE — Telephone Encounter (Signed)
 Called patient and she scheduled appointment for 03/16/24 @ 1:20 PM.

## 2024-03-16 ENCOUNTER — Ambulatory Visit: Admitting: Student

## 2024-03-22 ENCOUNTER — Encounter: Payer: Self-pay | Admitting: Family Medicine

## 2024-03-23 ENCOUNTER — Ambulatory Visit (HOSPITAL_BASED_OUTPATIENT_CLINIC_OR_DEPARTMENT_OTHER)
Admission: RE | Admit: 2024-03-23 | Discharge: 2024-03-23 | Disposition: A | Discharge status: Home / Self Care | Source: Ambulatory Visit | Attending: Family Medicine | Admitting: Family Medicine

## 2024-03-23 ENCOUNTER — Ambulatory Visit: Payer: Self-pay | Admitting: Family Medicine

## 2024-03-23 ENCOUNTER — Ambulatory Visit (INDEPENDENT_AMBULATORY_CARE_PROVIDER_SITE_OTHER): Admitting: Family Medicine

## 2024-03-23 VITALS — BP 140/85 | HR 82 | Temp 98.9°F | Ht 67.0 in | Wt 203.0 lb

## 2024-03-23 DIAGNOSIS — R051 Acute cough: Secondary | ICD-10-CM | POA: Diagnosis not present

## 2024-03-23 DIAGNOSIS — R042 Hemoptysis: Secondary | ICD-10-CM | POA: Diagnosis not present

## 2024-03-23 DIAGNOSIS — R059 Cough, unspecified: Secondary | ICD-10-CM | POA: Diagnosis not present

## 2024-03-23 DIAGNOSIS — R5383 Other fatigue: Secondary | ICD-10-CM | POA: Diagnosis not present

## 2024-03-23 MED ORDER — AMOXICILLIN-POT CLAVULANATE 875-125 MG PO TABS: 1.0000 | ORAL_TABLET | Freq: Two times a day (BID) | ORAL | 0 refills | Status: DC

## 2024-03-23 MED ORDER — BENZONATATE 100 MG PO CAPS: 100.0000 mg | ORAL_CAPSULE | Freq: Two times a day (BID) | ORAL | 0 refills | Status: AC | PRN

## 2024-03-23 NOTE — Progress Notes (Signed)
 Acute Office Visit  Subjective:     Patient ID: Alexandra Patterson, female    DOB: 02/02/1956, 68 y.o.   MRN: [id number]  Chief Complaint  Patient presents with   Cough     Patient is in today for cough.  Discussed the use of AI scribe software for clinical note transcription with the patient, who gave verbal consent to proceed.  History of Present Illness Alexandra Patterson is a 68 year old female who presents with a persistent cough and loss of taste.  She has experienced a persistent dry cough since March 12, 2024, which has worsened over the past ten days. The cough is severe, leading to episodes of gagging and hemoptysis, with greenish, bloody mucus production. She has been using Tessalon  Perles at night but has run out.  She reports a loss of taste and smell, suspecting a possible COVID-19 infection, though she has not been tested due to expired home tests and reluctance to leave the house. She experiences general fatigue, exacerbated by fibromyalgia, and a low-grade fever around 29F, for which she takes Tylenol . She uses Mucinex for mucus but avoids decongestants due to a history of atrial fibrillation triggered by such medications.  No body aches, but she had resolved cold symptoms such as runny nose, headaches, and ear pain, leaving the cough and fatigue as persistent issues.  Her husband encouraged her to seek medical attention after she coughed up blood. She drinks fluids throughout the day to help manage her symptoms.        All review of systems negative except what is listed in the HPI      Objective:    BP (!) 140/85   Pulse 82   Temp 98.9 F (37.2 C) (Oral)   Ht 5' 7 (1.702 m)   Wt 203 lb (92.1 kg)   SpO2 100%   BMI 31.79 kg/m    Physical Exam Vitals reviewed.  Constitutional:      General: She is not in acute distress.    Appearance: Normal appearance.   Cardiovascular:     Rate and Rhythm: Normal rate and regular rhythm.  Pulmonary:     Effort:  Pulmonary effort is normal. No respiratory distress.     Breath sounds: Rhonchi present. No wheezing.   Skin:    General: Skin is warm and dry.   Neurological:     Mental Status: She is alert and oriented to person, place, and time.   Psychiatric:        Mood and Affect: Mood normal.        Behavior: Behavior normal.        Thought Content: Thought content normal.        Judgment: Judgment normal.     No results found for any visits on 03/23/24.      Assessment & Plan:   Problem List Items Addressed This Visit       Active Problems   Cough - Primary   Relevant Medications   amoxicillin -clavulanate (AUGMENTIN ) 875-125 MG tablet   benzonatate  (TESSALON ) 100 MG capsule   Other Relevant Orders   DG Chest 2 View   Other Visit Diagnoses       Hemoptysis       Relevant Medications   amoxicillin -clavulanate (AUGMENTIN ) 875-125 MG tablet   benzonatate  (TESSALON ) 100 MG capsule   Other Relevant Orders   DG Chest 2 View       Assessment & Plan Cough, hemoptysis  Suspected COVID-19 with  possible secondary bacterial infection Symptoms consistent with COVID-19 for over ten days. Concern for secondary bacterial infection due to greenish, bloody sputum and hemoptysis.  - Order chest x-ray to evaluate for pneumonia. - Prescribe Augmentin  (amoxicillin /clavulanate). Adjust treatment plan based on xray results. - Prescribe benzonatate  for cough. - Advise guaifenesin twice daily. - Instruct to maintain hydration. - Advise hospital care for severe symptoms.  Hypertension Blood pressure elevated at 140/85 mmHg, possibly white coat hypertension. Advised home monitoring. - Advise home blood pressure monitoring and report if consistently above 140 mmHg.     Meds ordered this encounter  Medications   amoxicillin -clavulanate (AUGMENTIN ) 875-125 MG tablet    Sig: Take 1 tablet by mouth 2 (two) times daily.    Dispense:  20 tablet    Refill:  0    Supervising Provider:    DOMENICA BLACKBIRD A [[id number]]   benzonatate  (TESSALON ) 100 MG capsule    Sig: Take 1 capsule (100 mg total) by mouth 2 (two) times daily as needed for cough.    Dispense:  20 capsule    Refill:  0    Supervising Provider:   DOMENICA BLACKBIRD A [[id number]]    Return if symptoms worsen or fail to improve.  Alexandra Patterson Mon, NP

## 2024-04-13 DIAGNOSIS — M2550 Pain in unspecified joint: Secondary | ICD-10-CM | POA: Diagnosis not present

## 2024-04-13 DIAGNOSIS — N951 Menopausal and female climacteric states: Secondary | ICD-10-CM | POA: Diagnosis not present

## 2024-04-13 DIAGNOSIS — R6882 Decreased libido: Secondary | ICD-10-CM | POA: Diagnosis not present

## 2024-04-13 DIAGNOSIS — E038 Other specified hypothyroidism: Secondary | ICD-10-CM | POA: Diagnosis not present

## 2024-04-15 DIAGNOSIS — N951 Menopausal and female climacteric states: Secondary | ICD-10-CM | POA: Diagnosis not present

## 2024-04-15 DIAGNOSIS — Z79890 Hormone replacement therapy (postmenopausal): Secondary | ICD-10-CM | POA: Diagnosis not present

## 2024-04-15 DIAGNOSIS — M2550 Pain in unspecified joint: Secondary | ICD-10-CM | POA: Diagnosis not present

## 2024-04-15 DIAGNOSIS — R232 Flushing: Secondary | ICD-10-CM | POA: Diagnosis not present

## 2024-04-15 DIAGNOSIS — Z6831 Body mass index (BMI) 31.0-31.9, adult: Secondary | ICD-10-CM | POA: Diagnosis not present

## 2024-04-19 DIAGNOSIS — I480 Paroxysmal atrial fibrillation: Secondary | ICD-10-CM | POA: Diagnosis not present

## 2024-04-20 DIAGNOSIS — I499 Cardiac arrhythmia, unspecified: Secondary | ICD-10-CM | POA: Diagnosis not present

## 2024-04-27 ENCOUNTER — Encounter: Payer: Self-pay | Admitting: Family Medicine

## 2024-05-04 ENCOUNTER — Ambulatory Visit (INDEPENDENT_AMBULATORY_CARE_PROVIDER_SITE_OTHER)

## 2024-05-04 VITALS — Ht 67.0 in | Wt 203.0 lb

## 2024-05-04 DIAGNOSIS — Z0000 Encounter for general adult medical examination without abnormal findings: Secondary | ICD-10-CM

## 2024-05-04 NOTE — Patient Instructions (Signed)
 Ms. Huwe , Thank you for taking time out of your busy schedule to complete your Annual Wellness Visit with me. I enjoyed our conversation and look forward to speaking with you again next year. I, as well as your care team,  appreciate your ongoing commitment to your health goals. Please review the following plan we discussed and let me know if I can assist you in the future. Your Game plan/ To Do List     Follow up Visits: We will see or speak with you next year for your Next Medicare AWV with our clinical staff Have you seen your provider in the last 6 months (3 months if uncontrolled diabetes)? Yes  Clinician Recommendations:  Aim for 30 minutes of exercise or brisk walking, 6-8 glasses of water, and 5 servings of fruits and vegetables each day.       This is a list of the screenings recommended for you:  Health Maintenance  Topic Date Due   Colon Cancer Screening  12/29/2023   Flu Shot  04/30/2024   COVID-19 Vaccine (5 - 2024-25 season) 09/29/2024*   Medicare Annual Wellness Visit  05/04/2025   DTaP/Tdap/Td vaccine (2 - Td or Tdap) 05/25/2025   Mammogram  10/29/2025   Pneumococcal Vaccine for age over 19  Completed   DEXA scan (bone density measurement)  Completed   Zoster (Shingles) Vaccine  Completed   Hepatitis B Vaccine  Aged Out   HPV Vaccine  Aged Out   Meningitis B Vaccine  Aged Out   Hepatitis C Screening  Discontinued  *Topic was postponed. The date shown is not the original due date.    Advanced directives: (ACP Link)Information on Advanced Care Planning can be found at [place]  Secretary of [facility] Advance Health Care Directives Advance Health Care Directives. [email or web address]   Advance Care Planning is important because it:  [x]  Makes sure you receive the medical care that is consistent with your values, goals, and preferences  [x]  It provides guidance to your family and loved ones and reduces their decisional burden about whether or not they are making the right  decisions based on your wishes.  Follow the link provided in your after visit summary or read over the paperwork we have mailed to you to help you started getting your Advance Directives in place. If you need assistance in completing these, please reach out to us  so that we can help you!  See attachments for Preventive Care and Fall Prevention Tips.

## 2024-05-04 NOTE — Progress Notes (Signed)
 Subjective:   Alexandra Patterson is a 68 y.o. who presents for a Medicare Wellness preventive visit.  As a reminder, Annual Wellness Visits don't include a physical exam, and some assessments may be limited, especially if this visit is performed virtually. We may recommend an in-person follow-up visit with your provider if needed.  Visit Complete: Virtual I connected with  Alexandra Patterson on 05/04/24 by a audio enabled telemedicine application and verified that I am speaking with the correct person using two identifiers.  Patient Location: Home  Provider Location: Home Office  I discussed the limitations of evaluation and management by telemedicine. The patient expressed understanding and agreed to proceed.  Vital Signs: Because this visit was a virtual/telehealth visit, some criteria may be missing or patient reported. Any vitals not documented were not able to be obtained and vitals that have been documented are patient reported.  VideoDeclined- This patient declined Librarian, academic. Therefore the visit was completed with audio only.  Persons Participating in Visit: Patient.  AWV Questionnaire: Yes: Patient Medicare AWV questionnaire was completed by the patient on 05/04/24; I have confirmed that all information answered by patient is correct and no changes since this date.  Cardiac Risk Factors include: advanced age (>13men, >48 women);dyslipidemia;hypertension     Objective:    Today's Vitals   05/04/24 1047  Weight: 203 lb (92.1 kg)  Height: 5' 7 (1.702 m)   Body mass index is 31.79 kg/m.     05/04/2024   10:55 AM 05/02/2023    1:21 PM 04/30/2022    1:01 PM 08/01/2021    9:59 AM 04/24/2021   10:57 AM 12/28/2020    7:46 AM 01/14/2019    3:21 PM  Advanced Directives  Does Patient Have a Medical Advance Directive? No No No No No No No  Would patient like information on creating a medical advance directive? Yes (MAU/Ambulatory/Procedural Areas -  Information given) No - Patient declined No - Patient declined No - Patient declined No - Patient declined No - Patient declined No - Patient declined      Data saved with a previous flowsheet row definition    Current Medications (verified) Outpatient Encounter Medications as of 05/04/2024  Medication Sig   amitriptyline (ELAVIL) 25 MG tablet Take 1 tablet (25 mg total) by mouth at bedtime.   benzonatate  (TESSALON ) 100 MG capsule Take 1 capsule (100 mg total) by mouth 2 (two) times daily as needed for cough.   busPIRone  (BUSPAR ) 10 MG tablet TAKE ONE TABLET (10MG  TOTAL) BY MOUTH TWICE DAILY   Cholecalciferol (VITAMIN D3) 75 MCG (3000 UT) TABS Take 1 capsule by mouth daily.   cyclobenzaprine (FLEXERIL) 5 MG tablet Take 5 mg by mouth 3 (three) times daily as needed for muscle spasms.   DULoxetine  (CYMBALTA ) 60 MG capsule TAKE TWO CAPSULES (120MG  TOTAL) BY MOUTH EVERY MORNING   esomeprazole  (NEXIUM ) 40 MG capsule Take 40 mg by mouth daily at 12 noon.   hydrOXYzine  (ATARAX /VISTARIL ) 25 MG tablet Take 1 tablet (25 mg total) by mouth at bedtime as needed.   levothyroxine  (SYNTHROID ) 125 MCG tablet TAKE ONE TABLET BY MOUTH ONCE WEEKLY BEFORE BREAKFAST ON MONDAY and TAKE ONE TABLET BY MOUTH ONCE WEEKLY BEFORE BREAKFAST ON  TUESDAYS and TAKE ONE TABLET BY MOUTH ONCE WEEKLY BEFORE BREAKFAST ON WEDNESDAYS and TAKE ONE TABLET BY MOUTH ONCE WEEKLY BEFORE BREAKFAST ON THURSDAYS and TAKE ONE TABLET BY MOUTH ONCE WEEKLY BEFORE BREAKFAST ON FRIDAYS and TAKE ONE TABLET  BY MOUTH ONCE WEEKLY BEFORE BREAKFAST ON SATURDAY   liothyronine (CYTOMEL) 5 MCG tablet Take by mouth.   losartan  (COZAAR ) 100 MG tablet TAKE ONE TABLET BY MOUTH ONCE DAILY   Magnesium  400 MG CAPS Take 400 mg by mouth daily. Reported on 02/13/2016   meloxicam  (MOBIC ) 15 MG tablet TAKE 1 TABLET BY MOUTH EVERY MORNING   Methen-Hyosc-Meth Blue-Na Phos (UROGESIC-BLUE) 81.6 MG TABS Take 1 tablet (81.6 mg total) by mouth 4 (four) times daily as needed.    metoprolol  succinate (TOPROL -XL) 100 MG 24 hr tablet TAKE ONE TABLET (100MG  TOTAL) BY MOUTH EVERY MORNING WITH 50MG  TABLET TO EQUAL 150MG  DAILY.   ondansetron  (ZOFRAN ) 4 MG tablet Take 4 mg by mouth every 8 (eight) hours as needed for nausea or vomiting.   Oxycodone  HCl 10 MG TABS Take 1 tablet (10 mg total) by mouth 3 (three) times daily as needed.   rosuvastatin  (CRESTOR ) 5 MG tablet Take 1 tablet (5 mg total) by mouth daily.   UBRELVY 100 MG TABS Take by mouth.   zonisamide (ZONEGRAN) 100 MG capsule Take 200 mg by mouth 2 (two) times daily.   [DISCONTINUED] amoxicillin -clavulanate (AUGMENTIN ) 875-125 MG tablet Take 1 tablet by mouth 2 (two) times daily.   No facility-administered encounter medications on file as of 05/04/2024.    Allergies (verified) Ciprofloxacin , Sulfa antibiotics, Sulfasalazine, Cortisone, Tramadol , Zonisamide, Erythromycin, Erythromycin base, Levofloxacin, Lisinopril, and Prednisone   History: Past Medical History:  Diagnosis Date   Allergy    Anemia    Anxiety    Aortic stenosis    Atrial fibrillation (HCC)    Back pain    Bilateral swelling of feet    Bursitis    Cataract    Cervical cancer screening 09/07/2015   Menarche at 11 Irregular and heavy and painful flow secondary to fibroids No history of abnormal pap in past, last pap roughly 2003 G2P2, s/p 2 SVD history of abnormal MGM, 1 abnl bx right breast benign, normal otherwise Noconcerns today TAH b/l SPO for fibroids and migrainesand breast bx on right gyn surgeries   Constipation    Cough 08/13/2015   Depression    Depression with anxiety 06/29/2016   Fainting    once to due heart out of rythm   Fibromyalgia    Frequent headaches    GERD (gastroesophageal reflux disease)    Hair loss disorder 06/03/2017   Heartburn    History of blood clots    History of chicken pox    Hx of blood clots    dvt   Hyperlipidemia    Hyperplastic colon polyp    Hypertension    Hypothyroid    Insomnia 08/13/2015    Internal hemorrhoids    Interstitial cystitis    Joint pain    Lesion of lung    xray in 2014 thought to be benign seen at [facility] pulmonology   Measles    h/o   Migraine    Myocardial infarction [facility])    Obesity    Osteoarthritis    Preventative health care 02/03/2017   Raynaud's disease    S/P AVR (aortic valve replacement) 03/19/2015   Shoulder pain 06/03/2017   UTI (lower urinary tract infection)    UTI (urinary tract infection) 02/11/2016   Past Surgical History:  Procedure Laterality Date   ABDOMINAL HYSTERECTOMY     TAH SPO   AORTIC VALVE REPLACEMENT  2014   with [facility]   ATRIAL ABLATION SURGERY     BLADDER SUSPENSION  BREAST BIOPSY Right    Needle Biopsy   CARDIAC VALVE REPLACEMENT  02/2013   COLONOSCOPY WITH PROPOFOL  N/A 12/28/2020   Procedure: COLONOSCOPY WITH PROPOFOL ;  Surgeon: Janalyn Keene NOVAK, MD;  Location: [facility] ENDOSCOPY;  Service: Endoscopy;  Laterality: N/A;   CORONARY ARTERY BYPASS GRAFT     DILATION AND CURETTAGE OF UTERUS     x 3   ESOPHAGOGASTRODUODENOSCOPY (EGD) WITH PROPOFOL  N/A 12/28/2020   Procedure: ESOPHAGOGASTRODUODENOSCOPY (EGD) WITH PROPOFOL ;  Surgeon: Janalyn Keene NOVAK, MD;  Location: [facility] ENDOSCOPY;  Service: Endoscopy;  Laterality: N/A;   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     SHOULDER SURGERY Left    arthroscopy for spurs   TONSILLECTOMY     TUBAL LIGATION     Family History  Problem Relation Age of Onset   Heart disease Mother        congestive heart failure   Arthritis Mother        s/p TKR   Neuropathy Mother    Hyperlipidemia Mother    Other Mother        familial mediterranean fever   Thyroid  disease Mother    Hypertension Mother    Obesity Mother    Cancer Mother    Varicose Veins Mother    Colon polyps Father    Alzheimer's disease Father    Alcohol abuse Father    Cancer Father        colon cancer   Cancer Brother        prostate cancer   Heart disease Brother        cardiomegaly   Other  Brother        familial mediterranean fever   Interstitial cystitis Daughter    Arthritis Daughter    Arthritis Son    Alcohol abuse Son        in remission   Mental illness Son        depression   Other Son        interstitial cystitis   Cancer Paternal Uncle    Arthritis Paternal Uncle    Asthma Maternal Grandmother    Congestive Heart Failure Maternal Grandmother    Heart disease Maternal Grandmother        chf   Stroke Maternal Grandfather    Diabetes Maternal Grandfather    Heart disease Maternal Grandfather        hardening of the arteries   Stroke Paternal Grandfather    Atrial fibrillation Paternal Grandfather    Alcohol abuse Paternal Grandfather    Heart disease Paternal Grandfather        afib   Colon cancer Other        parent   Arthritis Daughter    Asthma Daughter    Arthritis Daughter    Asthma Daughter    Social History   Socioeconomic History   Marital status: Married    Spouse name: Not on file   Number of children: 2   Years of education: Not on file   Highest education level: Bachelor's degree (e.g., BA, AB, BS)  Occupational History   Occupation: Public house manager: [employer]  Tobacco Use   Smoking status: Never   Smokeless tobacco: Never  Substance and Sexual Activity   Alcohol use: No   Drug use: No   Sexual activity: Yes    Comment: lives with husband and adult son, no major dietary restrictions, full diability  Other Topics Concern   Not  on file  Social History Narrative   Bakes cakes on the side    Social Drivers of Health   Financial Resource Strain: Low Risk  (05/04/2024)   Overall Financial Resource Strain (CARDIA)    Difficulty of Paying Living Expenses: Not hard at all  Food Insecurity: No Food Insecurity (05/04/2024)   Hunger Vital Sign    Worried About Running Out of Food in the Last Year: Never true    Ran Out of Food in the Last Year: Never true  Transportation Needs: No Transportation Needs (05/04/2024)    PRAPARE - Administrator, Civil Service (Medical): No    Lack of Transportation (Non-Medical): No  Physical Activity: Inactive (05/04/2024)   Exercise Vital Sign    Days of Exercise per Week: 0 days    Minutes of Exercise per Session: 0 min  Stress: No Stress Concern Present (05/04/2024)   [employer] of Occupational Health - Occupational Stress Questionnaire    Feeling of Stress: Not at all  Social Connections: Socially Integrated (05/04/2024)   Social Connection and Isolation Panel    Frequency of Communication with Friends and Family: More than three times a week    Frequency of Social Gatherings with Friends and Family: Patient declined    Attends Religious Services: More than 4 times per year    Active Member of [employer] or Organizations: Yes    Attends Engineer, structural: More than 4 times per year    Marital Status: Married    Tobacco Counseling Counseling given: Not Answered    Clinical Intake:  Pre-visit preparation completed: Yes  Pain : Faces Faces Pain Scale: Hurts even more Pain Type: Acute pain Pain Location: Hip Pain Orientation: Left, Right Pain Descriptors / Indicators: Burning Pain Onset: 1 to 4 weeks ago  Diabetes: No  Lab Results  Component Value Date   HGBA1C 5.3 12/04/2023   HGBA1C 5.3 06/05/2023   HGBA1C 5.5 12/03/2022     How often do you need to have someone help you when you read instructions, pamphlets, or other written materials from your doctor or pharmacy?: 1 - Never  Interpreter Needed?: No  Information entered by :: Charmaine Bloodgood LPN   Activities of Daily Living     05/04/2024    8:26 AM  In your present state of health, do you have any difficulty performing the following activities:  Hearing? 0  Vision? 0  Difficulty concentrating or making decisions? 0  Walking or climbing stairs? 1  Dressing or bathing? 0  Doing errands, shopping? 0  Preparing Food and eating ? N  Using the Toilet? N  In the past  six months, have you accidently leaked urine? Y  Do you have problems with loss of bowel control? N  Managing your Medications? N  Managing your Finances? N  Housekeeping or managing your Housekeeping? N    Patient Care Team: Domenica Harlene LABOR, MD as PCP - General (Family Medicine) Marcine Koren Fallow, MD as Referring Physician (Cardiology) Larinda Charleston, MD as Referring Physician (Internal Medicine) Burnie Dallas Coad, MD as Referring Physician (Cardiothoracic Surgery) Scot Richardson SAUNDERS., MD as Referring Physician (Neurology) Lindsey Redell Francis RIGGERS (Cardiology) Gwynneth Harlene ORN, MD as Referring Physician (Internal Medicine) Gretel Dorothyann BRAVO, MD as Referring Physician (Endocrinology) Dasie Alyce SQUIBB, MD as Referring Physician (Cardiology)  I have updated your Care Teams any recent Medical Services you may have received from other providers in the past year.     Assessment:  This is a routine wellness examination for Elayjah.  Hearing/Vision screen Hearing Screening - Comments:: Denies hearing difficulties   Vision Screening - Comments:: Wears rx glasses - up to date with routine eye exams    Goals Addressed             This Visit's Progress    Remain active and independent         Depression Screen     05/04/2024   10:54 AM 12/04/2023    2:40 PM 06/05/2023    1:41 PM 05/02/2023    1:25 PM 12/03/2022    1:47 PM 05/28/2022    1:44 PM 04/30/2022    1:03 PM  PHQ 2/9 Scores  PHQ - 2 Score 2 2 0 0 0 0 3  PHQ- 9 Score 9 10 0  0 1 7    Fall Risk     05/04/2024    8:26 AM 12/04/2023    2:40 PM 06/05/2023    1:41 PM 05/02/2023    1:13 PM 12/03/2022    1:47 PM  Fall Risk   Falls in the past year? 0 0 0 1 0  Comment    tripped over a box   Number falls in past yr: 0 0 0 0 0  Injury with Fall? 0 0 0 1 0  Risk for fall due to : No Fall Risks No Fall Risks  History of fall(s)   Follow up Falls evaluation completed;Education provided;Falls prevention discussed Falls  evaluation completed Falls evaluation completed Falls evaluation completed Falls evaluation completed    MEDICARE RISK AT HOME:  Medicare Risk at Home Any stairs in or around the home?: (Patient-Rptd) Yes If so, are there any without handrails?: (Patient-Rptd) No Home free of loose throw rugs in walkways, pet beds, electrical cords, etc?: (Patient-Rptd) No Adequate lighting in your home to reduce risk of falls?: (Patient-Rptd) Yes Life alert?: (Patient-Rptd) No Use of a cane, walker or w/c?: (Patient-Rptd) No Grab bars in the bathroom?: (Patient-Rptd) No Shower chair or bench in shower?: (Patient-Rptd) No Elevated toilet seat or a handicapped toilet?: (Patient-Rptd) Yes  TIMED UP AND GO:  Was the test performed?  No  Cognitive Function: Declined/Normal: No cognitive concerns noted by patient or family. Patient alert, oriented, able to answer questions appropriately and recall recent events. No signs of memory loss or confusion.        05/02/2023    1:25 PM 04/30/2022    1:13 PM  6CIT Screen  What Year? 0 points 0 points  What month? 0 points 0 points  What time? 0 points 0 points  Count back from 20 0 points 0 points  Months in reverse 0 points 0 points  Repeat phrase 0 points 0 points  Total Score 0 points 0 points    Immunizations Immunization History  Administered Date(s) Administered   Fluad Quad(high Dose 65+) 07/27/2020, 06/07/2021, 07/10/2022   Fluad Trivalent(High Dose 65+) 07/02/2023   Influenza,inj,Quad PF,6+ Mos 07/29/2019   PFIZER(Purple Top)SARS-COV-2 Vaccination 12/23/2019, 01/17/2020, 11/27/2020, 06/26/2021   PNEUMOCOCCAL CONJUGATE-20 05/28/2022   Pneumococcal Polysaccharide-23 07/27/2020   Tdap 05/26/2015   Zoster Recombinant(Shingrix) 05/29/2018, 08/01/2018   Zoster, Live 05/26/2015    Screening Tests Health Maintenance  Topic Date Due   Colonoscopy  12/29/2023   INFLUENZA VACCINE  04/30/2024   COVID-19 Vaccine (5 - 2024-25 season) 09/29/2024  (Originally 06/01/2023)   Medicare Annual Wellness (AWV)  05/04/2025   DTaP/Tdap/Td (2 - Td or Tdap) 05/25/2025   MAMMOGRAM  10/29/2025   Pneumococcal Vaccine: 50+ Years  Completed   DEXA SCAN  Completed   Zoster Vaccines- Shingrix  Completed   Hepatitis B Vaccines  Aged Out   HPV VACCINES  Aged Out   Meningococcal B Vaccine  Aged Out   Hepatitis C Screening  Discontinued    Health Maintenance  Health Maintenance Due  Topic Date Due   Colonoscopy  12/29/2023   INFLUENZA VACCINE  04/30/2024   Health Maintenance Topics Addressed:  Patient is scheduled for upcoming colonoscopy with Dr. Gwynneth   Additional Screening:  Vision Screening: Recommended annual ophthalmology exams for early detection of glaucoma and other disorders of the eye. Would you like a referral to an eye doctor? No    Dental Screening: Recommended annual dental exams for proper oral hygiene  Community Resource Referral / Chronic Care Management: CRR required this visit?  No   CCM required this visit?  No   Plan:    I have personally reviewed and noted the following in the patient's chart:   Medical and social history Use of alcohol, tobacco or illicit drugs  Current medications and supplements including opioid prescriptions. Patient is currently taking opioid prescriptions. Information provided to patient regarding non-opioid alternatives. Patient advised to discuss non-opioid treatment plan with their provider. Functional ability and status Nutritional status Physical activity Advanced directives List of other physicians Hospitalizations, surgeries, and ER visits in previous 12 months Vitals Screenings to include cognitive, depression, and falls Referrals and appointments  In addition, I have reviewed and discussed with patient certain preventive protocols, quality metrics, and best practice recommendations. A written personalized care plan for preventive services as well as general preventive  health recommendations were provided to patient.   Lavelle Pfeiffer [place], [place]   10/03/7972   After Visit Summary: (MyChart) Due to this being a telephonic visit, the after visit summary with patients personalized plan was offered to patient via MyChart   Notes: Nothing significant to report at this time.

## 2024-05-07 NOTE — Assessment & Plan Note (Signed)
 Encouraged moist heat and gentle stretching as tolerated. Report if symptoms worsen or seek immediate care. Had some PT but she feels it only helped a little. Consider topical treatments such aspercreme or lidocaine  topically

## 2024-05-07 NOTE — Assessment & Plan Note (Addendum)
 Pt follows with neurology and has upcoming appointment. Advised Pt to discuss this with neurology. Currently her pain is flared. Continue current meds for now. IM Toradol  30 mg given during OV today.  Referral placed to Ortho for further evaluation.

## 2024-05-07 NOTE — Progress Notes (Signed)
 Acute Office Visit  Subjective:     Patient ID: Alexandra Patterson, female    DOB: March 28, 1956, 68 y.o.   MRN: [id number]  Chief Complaint  Patient presents with   pain in both feet   pain in both hips    HPI Patient is in today for pain in left hip.  Past medical history fibromyalgia, Hx achillestendinopathy bilateral feet, chronic low back pain with left-sided sciatica, chronic left hip pain.  Follows wth Endo, GI, Urology, Neurology,  Patient reports new burning pain in both feet for the past 2-3 months, rated 3/10, occurring primarily with movement or standing. Also reports bilateral hip pain with sciatica, in the setting of a history of chronic low back and hip pain. Foot and hip pain are worse at night, interfering with sleep. Denies trauma, falls, or injury.  Chronic Pain, Fibromyalgia Medications-amitriptyline 25 mg at bedtime, duloxetine  60 mg daily, meloxicam  15 mg daily, oxycodone  10 mg 3 times daily  Anxiety-hydroxyzine  25 mg at bedtime, for 10 mg twice daily  Patient denies fever, chills, SOB, CP, palpitations, dyspnea, edema, HA, vision changes, N/V/D, abdominal pain, urinary symptoms, rash, weight changes, and recent illness or hospitalizations.   ROS    See HPI Objective:    BP 130/82 (BP Location: Left Arm, Patient Position: Sitting, Cuff Size: Large)   Pulse 80   Temp 97.9 F (36.6 C) (Oral)   Resp 12   Ht 5' 7 (1.702 m)   Wt 214 lb 6.4 oz (97.3 kg)   SpO2 99%   BMI 33.58 kg/m    Physical Exam  General: No acute distress. Awake and conversant.  Eyes: Normal conjunctiva, anicteric. Round symmetric pupils.  Respiratory: CTAB. Respirations are non-labored. No wheezing.  Skin: Warm. No rashes or ulcers.  Psych: Alert and oriented. Cooperative, Appropriate mood and affect, Normal judgment.  CV: RRR. No murmur. No lower extremity edema.  MSK: No clubbing or cyanosis.  Neuro:  CN II-XII grossly normal.    No results found for any visits on  05/11/24.      Assessment & Plan:   Problem List Items Addressed This Visit     Chronic low back pain with left-sided sciatica - Primary   Pt follows with neurology and has upcoming appointment. Advised Pt to discuss this with neurology. Currently her pain is flared. Continue current meds for now. IM Toradol  30 mg given during OV today.  Referral placed to Ortho for further evaluation.        Chronic pain of both feet   Pt has Hx of Achilles tendinopathy.  Last seen by Ortho for this in 2023, where conservative management and PT was recommended.  Offered PT referral for patient, patient denies at this time.  New c/o burning, tingling b/l feet, reports 3/10 pain.  Discussed gabapentin  medication with patient, at this time patient prefer further evaluation by Neuro and Ortho, does not want to start medication at this time.  Return to clinic for persistent or worsening symptoms.    Would like to have further evaluation by neurology and Ortho.        Left hip pain   Encouraged moist heat and gentle stretching as tolerated. Report if symptoms worsen or seek immediate care. Had some PT but she feels it only helped a little. Consider topical treatments such aspercreme or lidocaine  topically        Relevant Orders   Comp Met (CMET)   CBC with Differential/Platelet   Ambulatory referral to Orthopedic  Surgery   Obesity (BMI 30.0-34.9)   Encouraged DASH or MIND diet, decrease po intake and increase exercise as tolerated. Needs 7-8 hours of sleep nightly. Avoid trans fats, eat small, frequent meals every 4-5 hours with lean proteins, complex carbs and healthy fats. Minimize simple carbs, high fat foods and processed foods         Portions of this note were dictated using DRAGON voice recognition software. Please disregard any errors in transcription.    Meds ordered this encounter  Medications   ketorolac  (TORADOL ) 30 MG/ML injection 30 mg    No follow-ups on file.  Ahnesty Finfrock L  Dylyn Mclaren, NP

## 2024-05-11 ENCOUNTER — Encounter: Payer: Self-pay | Admitting: Student

## 2024-05-11 ENCOUNTER — Ambulatory Visit (INDEPENDENT_AMBULATORY_CARE_PROVIDER_SITE_OTHER): Admitting: Student

## 2024-05-11 ENCOUNTER — Encounter: Payer: Self-pay | Admitting: Family Medicine

## 2024-05-11 VITALS — BP 130/82 | HR 80 | Temp 97.9°F | Resp 12 | Ht 67.0 in | Wt 214.4 lb

## 2024-05-11 DIAGNOSIS — M79671 Pain in right foot: Secondary | ICD-10-CM

## 2024-05-11 DIAGNOSIS — G8929 Other chronic pain: Secondary | ICD-10-CM | POA: Diagnosis not present

## 2024-05-11 DIAGNOSIS — M5442 Lumbago with sciatica, left side: Secondary | ICD-10-CM | POA: Diagnosis not present

## 2024-05-11 DIAGNOSIS — E66811 Obesity, class 1: Secondary | ICD-10-CM | POA: Diagnosis not present

## 2024-05-11 DIAGNOSIS — M25552 Pain in left hip: Secondary | ICD-10-CM | POA: Diagnosis not present

## 2024-05-11 DIAGNOSIS — M79672 Pain in left foot: Secondary | ICD-10-CM

## 2024-05-11 MED ORDER — KETOROLAC TROMETHAMINE 30 MG/ML IJ SOLN
30.0000 mg | Freq: Once | INTRAMUSCULAR | Status: AC
  Administered 2024-05-11 (×2): 30 mg via INTRAMUSCULAR

## 2024-05-11 NOTE — Assessment & Plan Note (Addendum)
 Pt has Hx of Achilles tendinopathy.  Last seen by Ortho for this in 2023, where conservative management and PT was recommended.  Offered PT referral for patient, patient denies at this time.  New c/o burning, tingling b/l feet, reports 3/10 pain.  Discussed gabapentin  medication with patient, at this time patient prefer further evaluation by Neuro and Ortho, does not want to start medication at this time.  Return to clinic for persistent or worsening symptoms.    Would like to have further evaluation by neurology and Ortho.

## 2024-05-11 NOTE — Assessment & Plan Note (Signed)
 Encouraged DASH or MIND diet, decrease po intake and increase exercise as tolerated. Needs 7-8 hours of sleep nightly. Avoid trans fats, eat small, frequent meals every 4-5 hours with lean proteins, complex carbs and healthy fats. Minimize simple carbs, high fat foods and processed foods

## 2024-05-12 MED ORDER — OXYCODONE HCL 10 MG PO TABS: 10.0000 mg | ORAL_TABLET | Freq: Three times a day (TID) | ORAL | 0 refills | Status: DC | PRN

## 2024-05-12 NOTE — Telephone Encounter (Signed)
 Requesting: oxycodone  Contract:06/11/22 UDS:05/28/22 Last Visit:05/11/24 Next Visit:06/14/24 Last Refill:02/11/24  Please Advise

## 2024-05-17 DIAGNOSIS — K3189 Other diseases of stomach and duodenum: Secondary | ICD-10-CM | POA: Diagnosis not present

## 2024-05-17 DIAGNOSIS — F419 Anxiety disorder, unspecified: Secondary | ICD-10-CM | POA: Diagnosis not present

## 2024-05-17 DIAGNOSIS — M797 Fibromyalgia: Secondary | ICD-10-CM | POA: Diagnosis not present

## 2024-05-17 DIAGNOSIS — I480 Paroxysmal atrial fibrillation: Secondary | ICD-10-CM | POA: Diagnosis not present

## 2024-05-17 DIAGNOSIS — Z952 Presence of prosthetic heart valve: Secondary | ICD-10-CM | POA: Diagnosis not present

## 2024-05-17 DIAGNOSIS — K449 Diaphragmatic hernia without obstruction or gangrene: Secondary | ICD-10-CM | POA: Diagnosis not present

## 2024-05-17 DIAGNOSIS — E063 Autoimmune thyroiditis: Secondary | ICD-10-CM | POA: Diagnosis not present

## 2024-05-17 DIAGNOSIS — Z1211 Encounter for screening for malignant neoplasm of colon: Secondary | ICD-10-CM | POA: Diagnosis not present

## 2024-05-17 DIAGNOSIS — K5730 Diverticulosis of large intestine without perforation or abscess without bleeding: Secondary | ICD-10-CM | POA: Diagnosis not present

## 2024-05-17 DIAGNOSIS — Z79899 Other long term (current) drug therapy: Secondary | ICD-10-CM | POA: Diagnosis not present

## 2024-05-17 DIAGNOSIS — I110 Hypertensive heart disease with heart failure: Secondary | ICD-10-CM | POA: Diagnosis not present

## 2024-05-17 DIAGNOSIS — E785 Hyperlipidemia, unspecified: Secondary | ICD-10-CM | POA: Diagnosis not present

## 2024-05-17 DIAGNOSIS — K2289 Other specified disease of esophagus: Secondary | ICD-10-CM | POA: Diagnosis not present

## 2024-05-17 DIAGNOSIS — K219 Gastro-esophageal reflux disease without esophagitis: Secondary | ICD-10-CM | POA: Diagnosis not present

## 2024-05-17 DIAGNOSIS — I509 Heart failure, unspecified: Secondary | ICD-10-CM | POA: Diagnosis not present

## 2024-05-17 DIAGNOSIS — F32A Depression, unspecified: Secondary | ICD-10-CM | POA: Diagnosis not present

## 2024-05-17 DIAGNOSIS — K2970 Gastritis, unspecified, without bleeding: Secondary | ICD-10-CM | POA: Diagnosis not present

## 2024-05-17 DIAGNOSIS — I350 Nonrheumatic aortic (valve) stenosis: Secondary | ICD-10-CM | POA: Diagnosis not present

## 2024-05-24 ENCOUNTER — Ambulatory Visit: Admitting: Sports Medicine

## 2024-05-25 DIAGNOSIS — R200 Anesthesia of skin: Secondary | ICD-10-CM | POA: Diagnosis not present

## 2024-05-25 DIAGNOSIS — G43009 Migraine without aura, not intractable, without status migrainosus: Secondary | ICD-10-CM | POA: Diagnosis not present

## 2024-05-25 DIAGNOSIS — M79605 Pain in left leg: Secondary | ICD-10-CM | POA: Diagnosis not present

## 2024-05-25 DIAGNOSIS — M79604 Pain in right leg: Secondary | ICD-10-CM | POA: Diagnosis not present

## 2024-06-09 ENCOUNTER — Other Ambulatory Visit: Payer: Self-pay | Admitting: Medical Genetics

## 2024-06-13 NOTE — Assessment & Plan Note (Signed)
Tolerating current meds, rate controlled

## 2024-06-13 NOTE — Assessment & Plan Note (Signed)
Improving with weight loss and testosterone treatment from [facility] MD

## 2024-06-13 NOTE — Assessment & Plan Note (Signed)
 Despite Nexium  twice a day she is still having daily symptoms. Add Famotidine  40 mg po qhs. Avoid offending foods, start probiotics. Do not eat large meals in late evening and consider raising head of bed. Attempt modest weight loss.

## 2024-06-14 ENCOUNTER — Ambulatory Visit (INDEPENDENT_AMBULATORY_CARE_PROVIDER_SITE_OTHER): Admitting: Family Medicine

## 2024-06-14 ENCOUNTER — Encounter: Payer: Self-pay | Admitting: Family Medicine

## 2024-06-14 VITALS — BP 128/88 | HR 76 | Resp 16 | Ht 67.0 in | Wt 218.2 lb

## 2024-06-14 DIAGNOSIS — M797 Fibromyalgia: Secondary | ICD-10-CM | POA: Diagnosis not present

## 2024-06-14 DIAGNOSIS — K219 Gastro-esophageal reflux disease without esophagitis: Secondary | ICD-10-CM

## 2024-06-14 DIAGNOSIS — R739 Hyperglycemia, unspecified: Secondary | ICD-10-CM

## 2024-06-14 DIAGNOSIS — E785 Hyperlipidemia, unspecified: Secondary | ICD-10-CM | POA: Diagnosis not present

## 2024-06-14 DIAGNOSIS — E559 Vitamin D deficiency, unspecified: Secondary | ICD-10-CM

## 2024-06-14 DIAGNOSIS — I4891 Unspecified atrial fibrillation: Secondary | ICD-10-CM | POA: Diagnosis not present

## 2024-06-14 DIAGNOSIS — I10 Essential (primary) hypertension: Secondary | ICD-10-CM | POA: Diagnosis not present

## 2024-06-14 NOTE — Patient Instructions (Addendum)
 RSV, Respiratory Syncitial Virus vaccine, Arexvy at pharmacy  Annual flu and covid vaccines  Hypertension, Adult High blood pressure (hypertension) is when the force of blood pumping through the arteries is too strong. The arteries are the blood vessels that carry blood from the heart throughout the body. Hypertension forces the heart to work harder to pump blood and may cause arteries to become narrow or stiff. Untreated or uncontrolled hypertension can lead to a heart attack, heart failure, a stroke, kidney disease, and other problems. A blood pressure reading consists of a higher number over a lower number. Ideally, your blood pressure should be below 120/80. The first (top) number is called the systolic pressure. It is a measure of the pressure in your arteries as your heart beats. The second (bottom) number is called the diastolic pressure. It is a measure of the pressure in your arteries as the heart relaxes. What are the causes? The exact cause of this condition is not known. There are some conditions that result in high blood pressure. What increases the risk? Certain factors may make you more likely to develop high blood pressure. Some of these risk factors are under your control, including: Smoking. Not getting enough exercise or physical activity. Being overweight. Having too much fat, sugar, calories, or salt (sodium) in your diet. Drinking too much alcohol. Other risk factors include: Having a personal history of heart disease, diabetes, high cholesterol, or kidney disease. Stress. Having a family history of high blood pressure and high cholesterol. Having obstructive sleep apnea. Age. The risk increases with age. What are the signs or symptoms? High blood pressure may not cause symptoms. Very high blood pressure (hypertensive crisis) may cause: Headache. Fast or irregular heartbeats (palpitations). Shortness of breath. Nosebleed. Nausea and vomiting. Vision  changes. Severe chest pain, dizziness, and seizures. How is this diagnosed? This condition is diagnosed by measuring your blood pressure while you are seated, with your arm resting on a flat surface, your legs uncrossed, and your feet flat on the floor. The cuff of the blood pressure monitor will be placed directly against the skin of your upper arm at the level of your heart. Blood pressure should be measured at least twice using the same arm. Certain conditions can cause a difference in blood pressure between your right and left arms. If you have a high blood pressure reading during one visit or you have normal blood pressure with other risk factors, you may be asked to: Return on a different day to have your blood pressure checked again. Monitor your blood pressure at home for 1 week or longer. If you are diagnosed with hypertension, you may have other blood or imaging tests to help your health care provider understand your overall risk for other conditions. How is this treated? This condition is treated by making healthy lifestyle changes, such as eating healthy foods, exercising more, and reducing your alcohol intake. You may be referred for counseling on a healthy diet and physical activity. Your health care provider may prescribe medicine if lifestyle changes are not enough to get your blood pressure under control and if: Your systolic blood pressure is above 130. Your diastolic blood pressure is above 80. Your personal target blood pressure may vary depending on your medical conditions, your age, and other factors. Follow these instructions at home: Eating and drinking  Eat a diet that is high in fiber and potassium, and low in sodium, added sugar, and fat. An example of this eating plan is called the  DASH diet. DASH stands for Dietary Approaches to Stop Hypertension. To eat this way: Eat plenty of fresh fruits and vegetables. Try to fill one half of your plate at each meal with fruits and  vegetables. Eat whole grains, such as whole-wheat pasta, brown rice, or whole-grain bread. Fill about one fourth of your plate with whole grains. Eat or drink low-fat dairy products, such as skim milk or low-fat yogurt. Avoid fatty cuts of meat, processed or cured meats, and poultry with skin. Fill about one fourth of your plate with lean proteins, such as fish, chicken without skin, beans, eggs, or tofu. Avoid pre-made and processed foods. These tend to be higher in sodium, added sugar, and fat. Reduce your daily sodium intake. Many people with hypertension should eat less than 1,500 mg of sodium a day. Do not drink alcohol if: Your health care provider tells you not to drink. You are pregnant, may be pregnant, or are planning to become pregnant. If you drink alcohol: Limit how much you have to: 0-1 drink a day for women. 0-2 drinks a day for men. Know how much alcohol is in your drink. In the [place] one drink equals one 12 oz bottle of beer (355 mL), one 5 oz glass of wine (148 mL), or one 1 oz glass of hard liquor (44 mL). Lifestyle  Work with your health care provider to maintain a healthy body weight or to lose weight. Ask what an ideal weight is for you. Get at least 30 minutes of exercise that causes your heart to beat faster (aerobic exercise) most days of the week. Activities may include walking, swimming, or biking. Include exercise to strengthen your muscles (resistance exercise), such as Pilates or lifting weights, as part of your weekly exercise routine. Try to do these types of exercises for 30 minutes at least 3 days a week. Do not use any products that contain nicotine or tobacco. These products include cigarettes, chewing tobacco, and vaping devices, such as e-cigarettes. If you need help quitting, ask your health care provider. Monitor your blood pressure at home as told by your health care provider. Keep all follow-up visits. This is important. Medicines Take  over-the-counter and prescription medicines only as told by your health care provider. Follow directions carefully. Blood pressure medicines must be taken as prescribed. Do not skip doses of blood pressure medicine. Doing this puts you at risk for problems and can make the medicine less effective. Ask your health care provider about side effects or reactions to medicines that you should watch for. Contact a health care provider if you: Think you are having a reaction to a medicine you are taking. Have headaches that keep coming back (recurring). Feel dizzy. Have swelling in your ankles. Have trouble with your vision. Get help right away if you: Develop a severe headache or confusion. Have unusual weakness or numbness. Feel faint. Have severe pain in your chest or abdomen. Vomit repeatedly. Have trouble breathing. These symptoms may be an emergency. Get help right away. Call 911. Do not wait to see if the symptoms will go away. Do not drive yourself to the hospital. Summary Hypertension is when the force of blood pumping through your arteries is too strong. If this condition is not controlled, it may put you at risk for serious complications. Your personal target blood pressure may vary depending on your medical conditions, your age, and other factors. For most people, a normal blood pressure is less than 120/80. Hypertension is treated with lifestyle  changes, medicines, or a combination of both. Lifestyle changes include losing weight, eating a healthy, low-sodium diet, exercising more, and limiting alcohol. This information is not intended to replace advice given to you by your health care provider. Make sure you discuss any questions you have with your health care provider. Document Revised: 07/24/2021 Document Reviewed: 07/24/2021 Elsevier Patient Education  2024 [employer].

## 2024-06-14 NOTE — Progress Notes (Signed)
 Subjective:    Patient ID: Alexandra Patterson, female    DOB: 1956/02/13, 68 y.o.   MRN: [id number]  Chief Complaint  Patient presents with   Medical Management of Chronic Issues    Patient presents today for a 6 month follow-up.   Quality Metric Gaps    Colonoscopy     HPI Discussed the use of AI scribe software for clinical note transcription with the patient, who gave verbal consent to proceed.  History of Present Illness Alexandra Patterson is a 68 year old female who presents for a routine follow-up visit.  She had COVID-19 in July 2025, which resulted in moderate illness. Since then, she has not experienced any recent ER visits, chest pain, or bowel troubles.  She has a history of colonoscopies every five years, with the most recent one resulting in a ten-year clearance.  Her daughter, aged 55, is experiencing rectal bleeding and is scheduled for an emergency colonoscopy. She has been actively involved in her daughter's medical care, accompanying her to appointments and procedures.  Her husband has a history of prostate and skin cancers, and cancer runs on his side of the family. She has been emotionally affected by the recent passing of three close friends.  She is currently taking a medication for her bladder, referred to as 'the blue pill', which is costly due to lack of insurance coverage.  Her Labradoodle, aged six, has experienced rectal bleeding and underwent a colonoscopy revealing a benign condition. The dog has started bleeding again, but she is hesitant to pursue further invasive procedures. Patient does not endorse CP/palp/SOB/HA/congestion/fevers/GI or GU c/o. Taking meds as prescribed     Past Medical History:  Diagnosis Date   Allergy    Anemia    Anxiety    Aortic stenosis    Atrial fibrillation (HCC)    Back pain    Bilateral swelling of feet    Bursitis    Cataract    Cervical cancer screening 09/07/2015   Menarche at 11 Irregular and heavy and painful flow  secondary to fibroids No history of abnormal pap in past, last pap roughly 2003 G2P2, s/p 2 SVD history of abnormal MGM, 1 abnl bx right breast benign, normal otherwise Noconcerns today TAH b/l SPO for fibroids and migrainesand breast bx on right gyn surgeries   Constipation    Cough 08/13/2015   Depression    Depression with anxiety 06/29/2016   Fainting    once to due heart out of rythm   Fibromyalgia    Frequent headaches    GERD (gastroesophageal reflux disease)    Hair loss disorder 06/03/2017   Heartburn    History of blood clots    History of chicken pox    Hx of blood clots    dvt   Hyperlipidemia    Hyperplastic colon polyp    Hypertension    Hypothyroid    Insomnia 08/13/2015   Internal hemorrhoids    Interstitial cystitis    Joint pain    Lesion of lung    xray in 2014 thought to be benign seen at [facility] pulmonology   Measles    h/o   Migraine    Myocardial infarction [facility])    Obesity    Osteoarthritis    Preventative health care 02/03/2017   Raynaud's disease    S/P AVR (aortic valve replacement) 03/19/2015   Shoulder pain 06/03/2017   UTI (lower urinary tract infection)    UTI (urinary tract infection) 02/11/2016  Past Surgical History:  Procedure Laterality Date   ABDOMINAL HYSTERECTOMY     TAH SPO   AORTIC VALVE REPLACEMENT  2014   with [facility]   ATRIAL ABLATION SURGERY     BLADDER SUSPENSION     BREAST BIOPSY Right    Needle Biopsy   CARDIAC VALVE REPLACEMENT  02/2013   COLONOSCOPY WITH PROPOFOL  N/A 12/28/2020   Procedure: COLONOSCOPY WITH PROPOFOL ;  Surgeon: Janalyn Keene NOVAK, MD;  Location: [facility] ENDOSCOPY;  Service: Endoscopy;  Laterality: N/A;   CORONARY ARTERY BYPASS GRAFT     DILATION AND CURETTAGE OF UTERUS     x 3   ESOPHAGOGASTRODUODENOSCOPY (EGD) WITH PROPOFOL  N/A 12/28/2020   Procedure: ESOPHAGOGASTRODUODENOSCOPY (EGD) WITH PROPOFOL ;  Surgeon: Janalyn Keene NOVAK, MD;  Location: [facility] ENDOSCOPY;  Service: Endoscopy;   Laterality: N/A;   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     SHOULDER SURGERY Left    arthroscopy for spurs   TONSILLECTOMY     TUBAL LIGATION      Family History  Problem Relation Age of Onset   Heart disease Mother        congestive heart failure   Arthritis Mother        s/p TKR   Neuropathy Mother    Hyperlipidemia Mother    Other Mother        familial mediterranean fever   Thyroid  disease Mother    Hypertension Mother    Obesity Mother    Cancer Mother    Varicose Veins Mother    Colon polyps Father    Alzheimer's disease Father    Alcohol abuse Father    Cancer Father        colon cancer   Cancer Brother        prostate cancer   Heart disease Brother        cardiomegaly   Other Brother        familial mediterranean fever   Interstitial cystitis Daughter    Arthritis Daughter    Arthritis Son    Alcohol abuse Son        in remission   Mental illness Son        depression   Other Son        interstitial cystitis   Cancer Paternal Uncle    Arthritis Paternal Uncle    Asthma Maternal Grandmother    Congestive Heart Failure Maternal Grandmother    Heart disease Maternal Grandmother        chf   Stroke Maternal Grandfather    Diabetes Maternal Grandfather    Heart disease Maternal Grandfather        hardening of the arteries   Stroke Paternal Grandfather    Atrial fibrillation Paternal Grandfather    Alcohol abuse Paternal Grandfather    Heart disease Paternal Grandfather        afib   Colon cancer Other        parent   Arthritis Daughter    Asthma Daughter    Arthritis Daughter    Asthma Daughter     Social History   Socioeconomic History   Marital status: Married    Spouse name: Not on file   Number of children: 2   Years of education: Not on file   Highest education level: Bachelor's degree (e.g., BA, AB, BS)  Occupational History   Occupation: Public house manager: [employer]  Tobacco Use   Smoking status: Never  Smokeless tobacco: Never  Substance and Sexual Activity   Alcohol use: No   Drug use: No   Sexual activity: Yes    Comment: lives with husband and adult son, no major dietary restrictions, full diability  Other Topics Concern   Not on file  Social History Narrative   Bakes cakes on the side    Social Drivers of Health   Financial Resource Strain: Low Risk  (05/04/2024)   Overall Financial Resource Strain (CARDIA)    Difficulty of Paying Living Expenses: Not hard at all  Food Insecurity: No Food Insecurity (05/04/2024)   Hunger Vital Sign    Worried About Running Out of Food in the Last Year: Never true    Ran Out of Food in the Last Year: Never true  Transportation Needs: No Transportation Needs (05/04/2024)   PRAPARE - Administrator, Civil Service (Medical): No    Lack of Transportation (Non-Medical): No  Physical Activity: Inactive (05/04/2024)   Exercise Vital Sign    Days of Exercise per Week: 0 days    Minutes of Exercise per Session: 0 min  Stress: No Stress Concern Present (05/04/2024)   [employer] of Occupational Health - Occupational Stress Questionnaire    Feeling of Stress: Not at all  Social Connections: Socially Integrated (05/04/2024)   Social Connection and Isolation Panel    Frequency of Communication with Friends and Family: More than three times a week    Frequency of Social Gatherings with Friends and Family: Patient declined    Attends Religious Services: More than 4 times per year    Active Member of [employer] or Organizations: Yes    Attends Engineer, structural: More than 4 times per year    Marital Status: Married  Catering manager Violence: Not At Risk (05/04/2024)   Humiliation, Afraid, Rape, and Kick questionnaire    Fear of Current or Ex-Partner: No    Emotionally Abused: No    Physically Abused: No    Sexually Abused: No    Outpatient Medications Prior to Visit  Medication Sig Dispense Refill   amitriptyline (ELAVIL) 25 MG tablet  Take 1 tablet (25 mg total) by mouth at bedtime.     benzonatate  (TESSALON ) 100 MG capsule Take 1 capsule (100 mg total) by mouth 2 (two) times daily as needed for cough. 20 capsule 0   busPIRone  (BUSPAR ) 10 MG tablet TAKE ONE TABLET (10MG  TOTAL) BY MOUTH TWICE DAILY 180 tablet 1   Cholecalciferol (VITAMIN D3) 75 MCG (3000 UT) TABS Take 1 capsule by mouth daily.     cyclobenzaprine (FLEXERIL) 5 MG tablet Take 5 mg by mouth 3 (three) times daily as needed for muscle spasms.     DULoxetine  (CYMBALTA ) 60 MG capsule TAKE TWO CAPSULES (120MG  TOTAL) BY MOUTH EVERY MORNING 180 capsule 1   esomeprazole  (NEXIUM ) 40 MG capsule Take 40 mg by mouth daily at 12 noon.     hydrOXYzine  (ATARAX /VISTARIL ) 25 MG tablet Take 1 tablet (25 mg total) by mouth at bedtime as needed. 90 tablet 1   levothyroxine  (SYNTHROID ) 125 MCG tablet TAKE ONE TABLET BY MOUTH ONCE WEEKLY BEFORE BREAKFAST ON MONDAY and TAKE ONE TABLET BY MOUTH ONCE WEEKLY BEFORE BREAKFAST ON  TUESDAYS and TAKE ONE TABLET BY MOUTH ONCE WEEKLY BEFORE BREAKFAST ON WEDNESDAYS and TAKE ONE TABLET BY MOUTH ONCE WEEKLY BEFORE BREAKFAST ON THURSDAYS and TAKE ONE TABLET BY MOUTH ONCE WEEKLY BEFORE BREAKFAST ON FRIDAYS and TAKE ONE TABLET BY MOUTH ONCE WEEKLY BEFORE  BREAKFAST ON SATURDAY 78 tablet 1   liothyronine (CYTOMEL) 5 MCG tablet Take by mouth.     losartan  (COZAAR ) 100 MG tablet TAKE ONE TABLET BY MOUTH ONCE DAILY 90 tablet 1   Magnesium  400 MG CAPS Take 400 mg by mouth daily. Reported on 02/13/2016     meloxicam  (MOBIC ) 15 MG tablet TAKE 1 TABLET BY MOUTH EVERY MORNING 30 tablet 10   Methen-Hyosc-Meth Blue-Na Phos (UROGESIC-BLUE) 81.6 MG TABS Take 1 tablet (81.6 mg total) by mouth 4 (four) times daily as needed.     metoprolol  succinate (TOPROL -XL) 100 MG 24 hr tablet TAKE ONE TABLET (100MG  TOTAL) BY MOUTH EVERY MORNING WITH 50MG  TABLET TO EQUAL 150MG  DAILY. 90 tablet 1   ondansetron  (ZOFRAN ) 4 MG tablet Take 4 mg by mouth every 8 (eight) hours as needed for  nausea or vomiting.     Oxycodone  HCl 10 MG TABS Take 1 tablet (10 mg total) by mouth 3 (three) times daily as needed. 90 tablet 0   rosuvastatin  (CRESTOR ) 5 MG tablet Take 1 tablet (5 mg total) by mouth daily. 90 tablet 1   UBRELVY 100 MG TABS Take by mouth.     zonisamide (ZONEGRAN) 100 MG capsule Take 200 mg by mouth 2 (two) times daily.     No facility-administered medications prior to visit.    Allergies  Allergen Reactions   Ciprofloxacin  Rash and Shortness Of Breath   Sulfa Antibiotics Hives   Sulfasalazine Hives   Cortisone Other (See Comments)    Red flush and anxiety   Tramadol  Other (See Comments)    headache   Zonisamide Other (See Comments)    unk   Erythromycin Rash   Erythromycin Base Rash   Levofloxacin Nausea Only   Lisinopril     Other reaction(s): Cough (ALLERGY/intolerance)   Prednisone Rash and Anxiety    Review of Systems  Constitutional:  Negative for fever and malaise/fatigue.  HENT:  Negative for congestion.   Eyes:  Negative for blurred vision.  Respiratory:  Negative for shortness of breath.   Cardiovascular:  Negative for chest pain, palpitations and leg swelling.  Gastrointestinal:  Negative for abdominal pain, blood in stool and nausea.  Genitourinary:  Negative for dysuria and frequency.  Musculoskeletal:  Negative for falls.  Skin:  Negative for rash.  Neurological:  Negative for dizziness, loss of consciousness and headaches.  Endo/Heme/Allergies:  Negative for environmental allergies.  Psychiatric/Behavioral:  Negative for depression. The patient is nervous/anxious.        Objective:    Physical Exam Constitutional:      General: She is not in acute distress.    Appearance: Normal appearance. She is well-developed. She is not toxic-appearing.  HENT:     Head: Normocephalic and atraumatic.     Right Ear: External ear normal.     Left Ear: External ear normal.     Nose: Nose normal.  Eyes:     General:        Right eye: No  discharge.        Left eye: No discharge.     Conjunctiva/sclera: Conjunctivae normal.  Neck:     Thyroid : No thyromegaly.  Cardiovascular:     Rate and Rhythm: Normal rate and regular rhythm.     Heart sounds: Normal heart sounds. No murmur heard. Pulmonary:     Effort: Pulmonary effort is normal. No respiratory distress.     Breath sounds: Normal breath sounds.  Abdominal:     General: Bowel  sounds are normal.     Palpations: Abdomen is soft.     Tenderness: There is no abdominal tenderness. There is no guarding.  Musculoskeletal:        General: Normal range of motion.     Cervical back: Neck supple.  Lymphadenopathy:     Cervical: No cervical adenopathy.  Skin:    General: Skin is warm and dry.  Neurological:     Mental Status: She is alert and oriented to person, place, and time.  Psychiatric:        Mood and Affect: Mood normal.        Behavior: Behavior normal.        Thought Content: Thought content normal.        Judgment: Judgment normal.     BP 128/88   Pulse 76   Resp 16   Ht 5' 7 (1.702 m)   Wt 218 lb 3.2 oz (99 kg)   SpO2 97%   BMI 34.17 kg/m  Wt Readings from Last 3 Encounters:  06/14/24 218 lb 3.2 oz (99 kg)  05/11/24 214 lb 6.4 oz (97.3 kg)  05/04/24 203 lb (92.1 kg)    Diabetic Foot Exam - Simple   No data filed    Lab Results  Component Value Date   WBC 6.1 12/04/2023   HGB 13.7 12/04/2023   HCT 42.2 12/04/2023   PLT 186.0 12/04/2023   GLUCOSE 88 12/04/2023   CHOL 194 12/04/2023   TRIG 63.0 12/04/2023   HDL 79.30 12/04/2023   LDLDIRECT 103.0 06/03/2017   LDLCALC 102 (H) 12/04/2023   ALT 12 12/04/2023   AST 15 12/04/2023   NA 140 12/04/2023   K 3.9 12/04/2023   CL 103 12/04/2023   CREATININE 1.01 12/04/2023   BUN 20 12/04/2023   CO2 27 12/04/2023   TSH 0.36 12/04/2023   HGBA1C 5.3 12/04/2023    Lab Results  Component Value Date   TSH 0.36 12/04/2023   Lab Results  Component Value Date   WBC 6.1 12/04/2023   HGB 13.7  12/04/2023   HCT 42.2 12/04/2023   MCV 92.8 12/04/2023   PLT 186.0 12/04/2023   Lab Results  Component Value Date   NA 140 12/04/2023   K 3.9 12/04/2023   CO2 27 12/04/2023   GLUCOSE 88 12/04/2023   BUN 20 12/04/2023   CREATININE 1.01 12/04/2023   BILITOT 0.6 12/04/2023   ALKPHOS 55 12/04/2023   AST 15 12/04/2023   ALT 12 12/04/2023   PROT 6.2 12/04/2023   ALBUMIN 4.4 12/04/2023   CALCIUM  9.3 12/04/2023   GFR 57.16 (L) 12/04/2023   Lab Results  Component Value Date   CHOL 194 12/04/2023   Lab Results  Component Value Date   HDL 79.30 12/04/2023   Lab Results  Component Value Date   LDLCALC 102 (H) 12/04/2023   Lab Results  Component Value Date   TRIG 63.0 12/04/2023   Lab Results  Component Value Date   CHOLHDL 2 12/04/2023   Lab Results  Component Value Date   HGBA1C 5.3 12/04/2023       Assessment & Plan:  Atrial fibrillation, unspecified type [facility]) Assessment & Plan: Tolerating current meds, rate controlled   Gastroesophageal reflux disease, unspecified whether esophagitis present Assessment & Plan: Despite Nexium  twice a day she is still having daily symptoms. Add Famotidine  40 mg po qhs. Avoid offending foods, start probiotics. Do not eat large meals in late evening and consider raising head of bed.  Attempt modest weight loss.    Fibromyalgia Assessment & Plan: Improving with weight loss and testosterone  treatment from [facility] MD     Assessment and Plan Assessment & Plan General Health Maintenance Discussion about vaccinations, including flu, COVID, and RSV vaccines. She had COVID in July and plans to get vaccinated in October for better coverage during the flu season. RSV vaccine, specifically the Rexvi brand, is recommended based on personal experience and studies showing its effectiveness. - Wait until October to receive flu and COVID vaccines for better coverage during flu season. - Consider RSV vaccination with Rexvi brand based on  recommendation. - Visit pharmacy for vaccinations as she offers walk-in vaccine clinics Monday to Friday, 9 to 4.  Depression No specific discussion of depression symptoms or management during this visit. The conversation focused on family and personal health issues.  Follow-up Plan for future lab work and follow-up visits. Labs from the last visit were satisfactory, and she is advised to schedule labs at her convenience within the next month. A follow-up visit is planned for six to seven months later. - Schedule lab appointment within the next month at her convenience. - Plan follow-up visit in six to seven months for annual physical.  Recording duration: 23 minutes     Harlene Horton, MD

## 2024-06-23 NOTE — Progress Notes (Signed)
 Alexandra Patterson                                          MRN: [id number]   06/23/2024   The [facility] Quality Team Specialist reviewed this patient medical record for the purposes of chart review for care gap closure. The following were reviewed: abstraction for care gap closure-controlling blood pressure.    [facility] Quality Team

## 2024-07-13 ENCOUNTER — Other Ambulatory Visit (INDEPENDENT_AMBULATORY_CARE_PROVIDER_SITE_OTHER)

## 2024-07-13 ENCOUNTER — Ambulatory Visit: Payer: Self-pay | Admitting: Family

## 2024-07-13 DIAGNOSIS — I10 Essential (primary) hypertension: Secondary | ICD-10-CM | POA: Diagnosis not present

## 2024-07-13 DIAGNOSIS — E559 Vitamin D deficiency, unspecified: Secondary | ICD-10-CM | POA: Diagnosis not present

## 2024-07-13 DIAGNOSIS — R739 Hyperglycemia, unspecified: Secondary | ICD-10-CM

## 2024-07-13 DIAGNOSIS — E785 Hyperlipidemia, unspecified: Secondary | ICD-10-CM | POA: Diagnosis not present

## 2024-07-13 LAB — COMPREHENSIVE METABOLIC PANEL WITH GFR
ALT: 11 U/L (ref 0–35)
AST: 14 U/L (ref 0–37)
Albumin: 4.3 g/dL (ref 3.5–5.2)
Alkaline Phosphatase: 50 U/L (ref 39–117)
BUN: 17 mg/dL (ref 6–23)
CO2: 29 meq/L (ref 19–32)
Calcium: 8.7 mg/dL (ref 8.4–10.5)
Chloride: 103 meq/L (ref 96–112)
Creatinine, Ser: 0.96 mg/dL (ref 0.40–1.20)
GFR: 60.49 mL/min (ref >60.00)
Glucose, Bld: 83 mg/dL (ref 70–99)
Potassium: 4.3 meq/L (ref 3.5–5.1)
Sodium: 138 meq/L (ref 135–145)
Total Bilirubin: 0.6 mg/dL (ref 0.2–1.2)
Total Protein: 6 g/dL (ref 6.0–8.3)

## 2024-07-13 LAB — CBC WITH DIFFERENTIAL/PLATELET
Basophils Absolute: 0.1 K/uL (ref 0.0–0.1)
Basophils Relative: 1.2 % (ref 0.0–3.0)
Eosinophils Absolute: 0.3 K/uL (ref 0.0–0.7)
Eosinophils Relative: 6.2 % — ABNORMAL HIGH (ref 0.0–5.0)
HCT: 44.8 % (ref 36.0–46.0)
Hemoglobin: 14.6 g/dL (ref 12.0–15.0)
Lymphocytes Relative: 31.2 % (ref 12.0–46.0)
Lymphs Abs: 1.4 K/uL (ref 0.7–4.0)
MCHC: 32.7 g/dL (ref 30.0–36.0)
MCV: 93.1 fl (ref 78.0–100.0)
Monocytes Absolute: 0.2 K/uL (ref 0.1–1.0)
Monocytes Relative: 5.5 % (ref 3.0–12.0)
Neutro Abs: 2.5 K/uL (ref 1.4–7.7)
Neutrophils Relative %: 55.9 % (ref 43.0–77.0)
Platelets: 171 K/uL (ref 150.0–400.0)
RBC: 4.82 Mil/uL (ref 3.87–5.11)
RDW: 13.9 % (ref 11.5–15.5)
WBC: 4.4 K/uL (ref 4.0–10.5)

## 2024-07-13 LAB — LIPID PANEL
Cholesterol: 193 mg/dL (ref 0–200)
HDL: 61.9 mg/dL (ref >39.00)
LDL Cholesterol: 113 mg/dL — ABNORMAL HIGH (ref 0–99)
NonHDL: 131.2
Total CHOL/HDL Ratio: 3
Triglycerides: 92 mg/dL (ref 0.0–149.0)
VLDL: 18.4 mg/dL (ref 0.0–40.0)

## 2024-07-13 LAB — TSH: TSH: 1.51 u[IU]/mL (ref 0.35–5.50)

## 2024-07-13 LAB — HEMOGLOBIN A1C: Hgb A1c MFr Bld: 5.4 % (ref 4.6–6.5)

## 2024-07-13 LAB — VITAMIN D 25 HYDROXY (VIT D DEFICIENCY, FRACTURES): VITD: 51.44 ng/mL (ref 30.00–100.00)

## 2024-07-24 ENCOUNTER — Other Ambulatory Visit: Payer: Self-pay | Admitting: Family

## 2024-07-27 ENCOUNTER — Other Ambulatory Visit: Payer: Self-pay | Admitting: Family Medicine

## 2024-07-27 ENCOUNTER — Encounter: Payer: Self-pay | Admitting: Family Medicine

## 2024-07-27 MED ORDER — MELOXICAM 15 MG PO TABS: 15.0000 mg | ORAL_TABLET | Freq: Every morning | ORAL | 1 refills | Status: AC

## 2024-08-01 ENCOUNTER — Encounter: Payer: Self-pay | Admitting: Family Medicine

## 2024-08-02 ENCOUNTER — Other Ambulatory Visit: Payer: Self-pay | Admitting: Family Medicine

## 2024-08-02 ENCOUNTER — Encounter: Payer: Self-pay | Admitting: Radiology

## 2024-08-02 MED ORDER — OXYCODONE HCL 10 MG PO TABS: 10.0000 mg | ORAL_TABLET | Freq: Three times a day (TID) | ORAL | 0 refills | Status: DC | PRN

## 2024-08-18 ENCOUNTER — Encounter: Payer: Self-pay | Admitting: Family Medicine

## 2024-08-20 ENCOUNTER — Ambulatory Visit

## 2024-08-20 DIAGNOSIS — Z23 Encounter for immunization: Secondary | ICD-10-CM | POA: Diagnosis not present

## 2024-08-23 ENCOUNTER — Other Ambulatory Visit: Payer: Self-pay | Admitting: Family Medicine

## 2024-08-31 ENCOUNTER — Other Ambulatory Visit (HOSPITAL_COMMUNITY)

## 2024-10-25 ENCOUNTER — Encounter: Payer: Self-pay | Admitting: Family Medicine

## 2024-10-25 MED ORDER — OXYCODONE HCL 10 MG PO TABS: 10.0000 mg | ORAL_TABLET | Freq: Three times a day (TID) | ORAL | 0 refills | Status: DC | PRN

## 2024-10-25 MED ORDER — ROSUVASTATIN CALCIUM 5 MG PO TABS: 5.0000 mg | ORAL_TABLET | Freq: Every day | ORAL | 0 refills | Status: AC

## 2024-10-25 NOTE — Telephone Encounter (Signed)
 Requesting: Oxycodone  Contract: none UDS: 2023 Last Visit: 06/14/2024 Next Visit: 12/23/2024 Last Refill: 08/02/2024 #90 no refills  Please Advise

## 2024-10-26 ENCOUNTER — Other Ambulatory Visit: Payer: Self-pay | Admitting: Family

## 2024-10-26 MED ORDER — OXYCODONE HCL 10 MG PO TABS: 10.0000 mg | ORAL_TABLET | Freq: Three times a day (TID) | ORAL | 0 refills | Status: AC | PRN

## 2024-11-02 ENCOUNTER — Other Ambulatory Visit (HOSPITAL_COMMUNITY)

## 2024-11-04 ENCOUNTER — Other Ambulatory Visit: Payer: Self-pay | Admitting: Medical Genetics

## 2024-11-04 DIAGNOSIS — Z006 Encounter for examination for normal comparison and control in clinical research program: Secondary | ICD-10-CM

## 2024-12-23 ENCOUNTER — Ambulatory Visit: Admitting: Student

## 2024-12-23 ENCOUNTER — Ambulatory Visit: Admitting: Family Medicine

## 2025-05-10 ENCOUNTER — Ambulatory Visit
